# Patient Record
Sex: Female | Born: 1955 | Race: White | Hispanic: No | Smoking: Former smoker
Health system: Southern US, Community
[De-identification: ages and names within clinical notes are randomized; demographics above are authoritative.]

## PROBLEM LIST (undated history)

## (undated) DIAGNOSIS — G119 Hereditary ataxia, unspecified: Secondary | ICD-10-CM

## (undated) DIAGNOSIS — D500 Iron deficiency anemia secondary to blood loss (chronic): Secondary | ICD-10-CM

## (undated) DIAGNOSIS — S3609XA Other injury of spleen, initial encounter: Secondary | ICD-10-CM

## (undated) DIAGNOSIS — F329 Major depressive disorder, single episode, unspecified: Secondary | ICD-10-CM

## (undated) DIAGNOSIS — K909 Intestinal malabsorption, unspecified: Secondary | ICD-10-CM

## (undated) DIAGNOSIS — F339 Major depressive disorder, recurrent, unspecified: Secondary | ICD-10-CM

## (undated) DIAGNOSIS — I7300 Raynaud's syndrome without gangrene: Secondary | ICD-10-CM

## (undated) DIAGNOSIS — F32A Depression, unspecified: Secondary | ICD-10-CM

## (undated) DIAGNOSIS — F419 Anxiety disorder, unspecified: Secondary | ICD-10-CM

## (undated) DIAGNOSIS — J309 Allergic rhinitis, unspecified: Secondary | ICD-10-CM

## (undated) DIAGNOSIS — J449 Chronic obstructive pulmonary disease, unspecified: Secondary | ICD-10-CM

## (undated) HISTORY — DX: Anxiety disorder, unspecified: F41.9

## (undated) HISTORY — DX: Allergic rhinitis, unspecified: J30.9

## (undated) HISTORY — PX: ABDOMINAL HYSTERECTOMY: SHX81

## (undated) HISTORY — DX: Raynaud's syndrome without gangrene: I73.00

## (undated) HISTORY — DX: Intestinal malabsorption, unspecified: K90.9

## (undated) HISTORY — PX: WRIST FRACTURE SURGERY: SHX121

## (undated) HISTORY — DX: Iron deficiency anemia secondary to blood loss (chronic): D50.0

## (undated) HISTORY — DX: Major depressive disorder, recurrent, unspecified: F33.9

---

## 2007-08-18 ENCOUNTER — Encounter: Admission: RE | Admit: 2007-08-18 | Discharge: 2007-08-18 | Payer: Self-pay | Admitting: Obstetrics & Gynecology

## 2007-08-27 ENCOUNTER — Encounter: Admission: RE | Admit: 2007-08-27 | Discharge: 2007-08-27 | Payer: Self-pay | Admitting: Obstetrics & Gynecology

## 2008-03-15 ENCOUNTER — Emergency Department (HOSPITAL_COMMUNITY): Admission: EM | Admit: 2008-03-15 | Discharge: 2008-03-15 | Payer: Self-pay | Admitting: Emergency Medicine

## 2008-05-26 ENCOUNTER — Emergency Department (HOSPITAL_BASED_OUTPATIENT_CLINIC_OR_DEPARTMENT_OTHER): Admission: EM | Admit: 2008-05-26 | Discharge: 2008-05-26 | Payer: Self-pay | Admitting: Emergency Medicine

## 2008-12-08 DIAGNOSIS — G119 Hereditary ataxia, unspecified: Secondary | ICD-10-CM

## 2008-12-08 HISTORY — DX: Hereditary ataxia, unspecified: G11.9

## 2011-06-15 ENCOUNTER — Encounter: Payer: Self-pay | Admitting: Emergency Medicine

## 2011-06-15 ENCOUNTER — Emergency Department (HOSPITAL_BASED_OUTPATIENT_CLINIC_OR_DEPARTMENT_OTHER)
Admission: EM | Admit: 2011-06-15 | Discharge: 2011-06-15 | Disposition: A | Discharge status: Home / Self Care | Payer: 59 | Attending: Emergency Medicine | Admitting: Emergency Medicine

## 2011-06-15 ENCOUNTER — Emergency Department (INDEPENDENT_AMBULATORY_CARE_PROVIDER_SITE_OTHER): Payer: 59

## 2011-06-15 DIAGNOSIS — M25549 Pain in joints of unspecified hand: Secondary | ICD-10-CM

## 2011-06-15 DIAGNOSIS — S52609A Unspecified fracture of lower end of unspecified ulna, initial encounter for closed fracture: Secondary | ICD-10-CM

## 2011-06-15 DIAGNOSIS — S52509A Unspecified fracture of the lower end of unspecified radius, initial encounter for closed fracture: Secondary | ICD-10-CM

## 2011-06-15 DIAGNOSIS — W19XXXA Unspecified fall, initial encounter: Secondary | ICD-10-CM

## 2011-06-15 DIAGNOSIS — S62009A Unspecified fracture of navicular [scaphoid] bone of unspecified wrist, initial encounter for closed fracture: Secondary | ICD-10-CM | POA: Insufficient documentation

## 2011-06-15 DIAGNOSIS — W010XXA Fall on same level from slipping, tripping and stumbling without subsequent striking against object, initial encounter: Secondary | ICD-10-CM | POA: Insufficient documentation

## 2011-06-15 DIAGNOSIS — M25539 Pain in unspecified wrist: Secondary | ICD-10-CM

## 2011-06-15 HISTORY — DX: Major depressive disorder, single episode, unspecified: F32.9

## 2011-06-15 HISTORY — DX: Depression, unspecified: F32.A

## 2011-06-15 MED ORDER — KETOROLAC TROMETHAMINE 60 MG/2ML IM SOLN
INTRAMUSCULAR | Status: AC
  Filled 2011-06-15: qty 2

## 2011-06-15 MED ORDER — HYDROCODONE-ACETAMINOPHEN 5-500 MG PO TABS: 2.0000 | ORAL_TABLET | Freq: Four times a day (QID) | ORAL | Status: AC | PRN

## 2011-06-15 MED ORDER — KETOROLAC TROMETHAMINE 60 MG/2ML IM SOLN
60.0000 mg | Freq: Once | INTRAMUSCULAR | Status: AC
  Administered 2011-06-15: 60 mg via INTRAMUSCULAR

## 2011-06-15 MED ORDER — FENTANYL CITRATE 0.05 MG/ML IJ SOLN
INTRAMUSCULAR | Status: AC
  Filled 2011-06-15: qty 2

## 2011-06-15 MED ORDER — FENTANYL CITRATE 0.05 MG/ML IJ SOLN
50.0000 ug | Freq: Once | INTRAMUSCULAR | Status: AC
  Administered 2011-06-15: 50 ug via INTRAMUSCULAR

## 2011-06-15 NOTE — ED Provider Notes (Signed)
History     Chief Complaint  Patient presents with  . Wrist Pain   Patient is a 56 y.o. female presenting with wrist pain. The history is provided by the patient. No language interpreter was used.  Wrist Pain This is a new problem. The current episode started 1 to 2 hours ago. The problem occurs constantly. The problem has not changed since onset.Pertinent negatives include no chest pain, no abdominal pain, no headaches and no shortness of breath. The symptoms are aggravated by bending and twisting. The symptoms are relieved by nothing. She has tried nothing for the symptoms.  Patient tripped and fell on an outstretched right hand, injuring and deforming the right wrist.  Has abrasion to B lateral malleoli.  Did not strike head no LOC  Past Medical History  Diagnosis Date  . Depression     Past Surgical History  Procedure Date  . Abdominal hysterectomy     History reviewed. No pertinent family history.  History  Substance Use Topics  . Smoking status: Current Everyday Smoker  . Smokeless tobacco: Not on file  . Alcohol Use: Yes     socially    OB History    Grav Para Term Preterm Abortions TAB SAB Ect Mult Living                  Review of Systems  Constitutional: Negative for activity change.  HENT: Negative for facial swelling.   Eyes: Negative for discharge.  Respiratory: Negative for shortness of breath.   Cardiovascular: Negative for chest pain.  Gastrointestinal: Negative for abdominal pain.  Genitourinary: Negative for difficulty urinating.  Musculoskeletal: Positive for arthralgias.  Neurological: Negative for headaches.  Hematological: Negative.   Psychiatric/Behavioral: Negative.     Physical Exam  BP 116/94  Pulse 84  Temp(Src) 97.4 F (36.3 C) (Oral)  Resp 16  SpO2 99%  Physical Exam  Constitutional: She is oriented to person, place, and time. She appears well-developed and well-nourished.  HENT:  Head: Normocephalic and atraumatic.  Eyes:  Pupils are equal, round, and reactive to light.  Neck: Normal range of motion. Neck supple.  Cardiovascular: Normal rate, regular rhythm and normal heart sounds.   Pulmonary/Chest: Effort normal and breath sounds normal.  Abdominal: Soft. She exhibits no distension and no mass. There is no tenderness. There is no rebound and no guarding.  Musculoskeletal:       Right wrist: She exhibits decreased range of motion, tenderness, bony tenderness, swelling and deformity.       Right ankle: She exhibits normal range of motion and no swelling. no tenderness.       Snuff box tenderness right wrist  Abrasions B lateral malleoli  Neurological: She is alert and oriented to person, place, and time.  Skin: Skin is warm and dry. No erythema.       Abrasions B lateral malleoli  Psychiatric: She has a normal mood and affect.    ED Course  Procedures Case d/w Dr/ Amanda Pea, will see patient in the office at 10 tomorrow.  Sugar tong splint MDM       Gerri Acre K Damaya Channing-Rasch, MD 06/16/11 2244

## 2011-06-15 NOTE — ED Notes (Signed)
I removed the patient's ring from her right ring finger. I had to use the manuel ring cutter to remove it.

## 2011-06-15 NOTE — ED Notes (Signed)
Pt presents to ED today s/p fall from standing with injuries to RUE.  Pt has noted swelling and possible deformity to wrist/forearm.  Pt guarding movments and states pain 10/10.  RUE braced with pillow and blankets

## 2011-06-15 NOTE — ED Notes (Signed)
Family at bedside.  Pt. Is alert and oriented with c/o pain in the R wrist with deformity noted.  Skin is warm and dry with no abrasions.

## 2011-06-15 NOTE — ED Notes (Signed)
Pt. Has had splint placed and aware of going to see Hand/ Wrist surgeon on Monday.

## 2011-06-15 NOTE — ED Notes (Signed)
Family at bedside. 

## 2013-08-15 DIAGNOSIS — G43909 Migraine, unspecified, not intractable, without status migrainosus: Secondary | ICD-10-CM | POA: Insufficient documentation

## 2014-02-22 DIAGNOSIS — F17210 Nicotine dependence, cigarettes, uncomplicated: Secondary | ICD-10-CM | POA: Insufficient documentation

## 2014-04-19 DIAGNOSIS — G112 Late-onset cerebellar ataxia: Secondary | ICD-10-CM | POA: Insufficient documentation

## 2014-10-26 ENCOUNTER — Emergency Department (HOSPITAL_COMMUNITY): Payer: Medicare Other

## 2014-10-26 ENCOUNTER — Inpatient Hospital Stay (HOSPITAL_COMMUNITY)
Admission: EM | Admit: 2014-10-26 | Discharge: 2014-10-31 | DRG: 800 | Disposition: A | Discharge status: Home / Self Care | Payer: Medicare Other | Attending: General Surgery | Admitting: General Surgery

## 2014-10-26 ENCOUNTER — Inpatient Hospital Stay (HOSPITAL_COMMUNITY): Payer: Medicare Other | Admitting: Certified Registered Nurse Anesthetist

## 2014-10-26 ENCOUNTER — Encounter (HOSPITAL_COMMUNITY): Payer: Self-pay | Admitting: Emergency Medicine

## 2014-10-26 ENCOUNTER — Encounter (HOSPITAL_COMMUNITY): Admission: EM | Disposition: A | Discharge status: Home / Self Care | Payer: 59 | Source: Home / Self Care

## 2014-10-26 DIAGNOSIS — R109 Unspecified abdominal pain: Secondary | ICD-10-CM

## 2014-10-26 DIAGNOSIS — F329 Major depressive disorder, single episode, unspecified: Secondary | ICD-10-CM | POA: Diagnosis present

## 2014-10-26 DIAGNOSIS — Y92009 Unspecified place in unspecified non-institutional (private) residence as the place of occurrence of the external cause: Secondary | ICD-10-CM

## 2014-10-26 DIAGNOSIS — W1830XA Fall on same level, unspecified, initial encounter: Secondary | ICD-10-CM | POA: Diagnosis present

## 2014-10-26 DIAGNOSIS — F17210 Nicotine dependence, cigarettes, uncomplicated: Secondary | ICD-10-CM | POA: Diagnosis present

## 2014-10-26 DIAGNOSIS — G111 Early-onset cerebellar ataxia: Secondary | ICD-10-CM | POA: Diagnosis present

## 2014-10-26 DIAGNOSIS — K661 Hemoperitoneum: Secondary | ICD-10-CM

## 2014-10-26 DIAGNOSIS — R7989 Other specified abnormal findings of blood chemistry: Secondary | ICD-10-CM

## 2014-10-26 DIAGNOSIS — W19XXXA Unspecified fall, initial encounter: Secondary | ICD-10-CM

## 2014-10-26 DIAGNOSIS — S3609XA Other injury of spleen, initial encounter: Secondary | ICD-10-CM | POA: Diagnosis present

## 2014-10-26 DIAGNOSIS — D62 Acute posthemorrhagic anemia: Secondary | ICD-10-CM | POA: Diagnosis present

## 2014-10-26 DIAGNOSIS — D72829 Elevated white blood cell count, unspecified: Secondary | ICD-10-CM | POA: Diagnosis not present

## 2014-10-26 DIAGNOSIS — S36039A Unspecified laceration of spleen, initial encounter: Secondary | ICD-10-CM

## 2014-10-26 DIAGNOSIS — R079 Chest pain, unspecified: Secondary | ICD-10-CM | POA: Diagnosis present

## 2014-10-26 DIAGNOSIS — Z9081 Acquired absence of spleen: Secondary | ICD-10-CM

## 2014-10-26 DIAGNOSIS — G119 Hereditary ataxia, unspecified: Secondary | ICD-10-CM

## 2014-10-26 HISTORY — DX: Other injury of spleen, initial encounter: S36.09XA

## 2014-10-26 HISTORY — DX: Hereditary ataxia, unspecified: G11.9

## 2014-10-26 HISTORY — PX: SPLENECTOMY, TOTAL: SHX788

## 2014-10-26 LAB — BASIC METABOLIC PANEL
Anion gap: 10 (ref 5–15)
Anion gap: 11 (ref 5–15)
BUN: 15 mg/dL (ref 6–23)
BUN: 15 mg/dL (ref 6–23)
CO2: 25 mEq/L (ref 19–32)
CO2: 24 mEq/L (ref 19–32)
Calcium: 8.7 mg/dL (ref 8.4–10.5)
Calcium: 8.4 mg/dL (ref 8.4–10.5)
Chloride: 108 mEq/L (ref 96–112)
Chloride: 107 mEq/L (ref 96–112)
Creatinine, Ser: 0.79 mg/dL (ref 0.50–1.10)
Creatinine, Ser: 0.7 mg/dL (ref 0.50–1.10)
GFR calc Af Amer: >90 mL/min (ref >90)
GFR calc Af Amer: >90 mL/min (ref >90)
GFR calc non Af Amer: 89 mL/min — ABNORMAL LOW (ref >90)
GFR calc non Af Amer: >90 mL/min (ref >90)
Glucose, Bld: 126 mg/dL — ABNORMAL HIGH (ref 70–99)
Glucose, Bld: 136 mg/dL — ABNORMAL HIGH (ref 70–99)
Potassium: 4.1 mEq/L (ref 3.7–5.3)
Potassium: 3.9 mEq/L (ref 3.7–5.3)
Sodium: 143 mEq/L (ref 137–147)
Sodium: 142 mEq/L (ref 137–147)

## 2014-10-26 LAB — HEPATIC FUNCTION PANEL
ALT: 10 U/L (ref 0–35)
AST: 13 U/L (ref 0–37)
Albumin: 3.3 g/dL — ABNORMAL LOW (ref 3.5–5.2)
Alkaline Phosphatase: 72 U/L (ref 39–117)
Bilirubin, Direct: <0.2 mg/dL (ref 0.0–0.3)
Total Bilirubin: <0.2 mg/dL — ABNORMAL LOW (ref 0.3–1.2)
Total Protein: 5.9 g/dL — ABNORMAL LOW (ref 6.0–8.3)

## 2014-10-26 LAB — ABO/RH: ABO/RH(D): O NEG

## 2014-10-26 LAB — CBC
HCT: 28.7 % — ABNORMAL LOW (ref 36.0–46.0)
HCT: 31.4 % — ABNORMAL LOW (ref 36.0–46.0)
Hemoglobin: 8.4 g/dL — ABNORMAL LOW (ref 12.0–15.0)
Hemoglobin: 9.8 g/dL — ABNORMAL LOW (ref 12.0–15.0)
MCH: 22.8 pg — ABNORMAL LOW (ref 26.0–34.0)
MCH: 25.3 pg — ABNORMAL LOW (ref 26.0–34.0)
MCHC: 29.3 g/dL — ABNORMAL LOW (ref 30.0–36.0)
MCHC: 31.2 g/dL (ref 30.0–36.0)
MCV: 77.8 fL — ABNORMAL LOW (ref 78.0–100.0)
MCV: 80.9 fL (ref 78.0–100.0)
Platelets: 217 10*3/uL (ref 150–400)
Platelets: 159 10*3/uL (ref 150–400)
RBC: 3.69 MIL/uL — ABNORMAL LOW (ref 3.87–5.11)
RBC: 3.88 MIL/uL (ref 3.87–5.11)
RDW: 16.2 % — ABNORMAL HIGH (ref 11.5–15.5)
RDW: 15.5 % (ref 11.5–15.5)
WBC: 8.3 10*3/uL (ref 4.0–10.5)
WBC: 17.5 10*3/uL — ABNORMAL HIGH (ref 4.0–10.5)

## 2014-10-26 LAB — PREPARE RBC (CROSSMATCH)

## 2014-10-26 LAB — I-STAT TROPONIN, ED: Troponin i, poc: 0 ng/mL (ref 0.00–0.08)

## 2014-10-26 LAB — MRSA PCR SCREENING: MRSA by PCR: NEGATIVE

## 2014-10-26 LAB — POC OCCULT BLOOD, ED: Fecal Occult Bld: NEGATIVE

## 2014-10-26 LAB — LIPASE, BLOOD: Lipase: 25 U/L (ref 11–59)

## 2014-10-26 LAB — D-DIMER, QUANTITATIVE: D-Dimer, Quant: 2.08 ug/mL-FEU — ABNORMAL HIGH (ref 0.00–0.48)

## 2014-10-26 SURGERY — SPLENECTOMY
Anesthesia: General

## 2014-10-26 MED ORDER — CEFAZOLIN SODIUM-DEXTROSE 2-3 GM-% IV SOLR
2.0000 g | Freq: Once | INTRAVENOUS | Status: AC
  Administered 2014-10-26: 2000 mg via INTRAVENOUS

## 2014-10-26 MED ORDER — FENTANYL CITRATE 0.05 MG/ML IJ SOLN
INTRAMUSCULAR | Status: AC
  Filled 2014-10-26: qty 5

## 2014-10-26 MED ORDER — LIDOCAINE HCL (CARDIAC) 20 MG/ML IV SOLN
INTRAVENOUS | Status: AC
  Filled 2014-10-26: qty 5

## 2014-10-26 MED ORDER — KCL IN DEXTROSE-NACL 20-5-0.45 MEQ/L-%-% IV SOLN
INTRAVENOUS | Status: DC
  Administered 2014-10-26 – 2014-10-30 (×8): via INTRAVENOUS
  Filled 2014-10-26 (×10): qty 1000

## 2014-10-26 MED ORDER — ONDANSETRON HCL 4 MG PO TABS: 4.0000 mg | ORAL_TABLET | Freq: Four times a day (QID) | ORAL | Status: DC | PRN

## 2014-10-26 MED ORDER — HYDROMORPHONE HCL 1 MG/ML IJ SOLN
INTRAMUSCULAR | Status: AC
  Filled 2014-10-26: qty 2

## 2014-10-26 MED ORDER — PROPOFOL 10 MG/ML IV BOLUS
INTRAVENOUS | Status: AC
  Filled 2014-10-26: qty 20

## 2014-10-26 MED ORDER — BISACODYL 10 MG RE SUPP: 10.0000 mg | Freq: Every day | RECTAL | Status: DC | PRN

## 2014-10-26 MED ORDER — DIPHENHYDRAMINE HCL 12.5 MG/5ML PO ELIX
12.5000 mg | ORAL_SOLUTION | Freq: Four times a day (QID) | ORAL | Status: DC | PRN
  Filled 2014-10-26: qty 5

## 2014-10-26 MED ORDER — FENTANYL CITRATE 0.05 MG/ML IJ SOLN
100.0000 ug | Freq: Once | INTRAMUSCULAR | Status: AC
  Administered 2014-10-26: 100 ug via INTRAVENOUS
  Filled 2014-10-26: qty 2

## 2014-10-26 MED ORDER — NALOXONE HCL 0.4 MG/ML IJ SOLN
0.4000 mg | INTRAMUSCULAR | Status: DC | PRN
  Filled 2014-10-26: qty 1

## 2014-10-26 MED ORDER — ONDANSETRON HCL 4 MG/2ML IJ SOLN
INTRAMUSCULAR | Status: DC | PRN
  Administered 2014-10-26: 4 mg via INTRAVENOUS

## 2014-10-26 MED ORDER — POTASSIUM CHLORIDE IN NACL 20-0.9 MEQ/L-% IV SOLN
INTRAVENOUS | Status: DC
  Filled 2014-10-26 (×4): qty 1000

## 2014-10-26 MED ORDER — HYDROMORPHONE HCL 1 MG/ML IJ SOLN
0.5000 mg | INTRAMUSCULAR | Status: DC | PRN
  Administered 2014-10-26 – 2014-10-27 (×2): 1 mg via INTRAVENOUS
  Filled 2014-10-26 (×2): qty 1

## 2014-10-26 MED ORDER — PROPOFOL 10 MG/ML IV BOLUS
INTRAVENOUS | Status: DC | PRN
  Administered 2014-10-26: 50 mg via INTRAVENOUS

## 2014-10-26 MED ORDER — HYDROMORPHONE HCL 1 MG/ML IJ SOLN
0.2500 mg | INTRAMUSCULAR | Status: DC | PRN
  Administered 2014-10-26 (×4): 0.5 mg via INTRAVENOUS

## 2014-10-26 MED ORDER — IOHEXOL 300 MG/ML  SOLN
70.0000 mL | Freq: Once | INTRAMUSCULAR | Status: AC | PRN
  Administered 2014-10-26: 70 mL via INTRAVENOUS

## 2014-10-26 MED ORDER — ONDANSETRON HCL 4 MG/2ML IJ SOLN
4.0000 mg | Freq: Four times a day (QID) | INTRAMUSCULAR | Status: DC | PRN
  Administered 2014-10-28 – 2014-10-29 (×2): 4 mg via INTRAVENOUS
  Filled 2014-10-26: qty 2

## 2014-10-26 MED ORDER — SODIUM CHLORIDE 0.9 % IV SOLN: Freq: Once | INTRAVENOUS | Status: DC

## 2014-10-26 MED ORDER — LACTATED RINGERS IV SOLN
INTRAVENOUS | Status: DC | PRN
  Administered 2014-10-26 (×2): via INTRAVENOUS

## 2014-10-26 MED ORDER — SCOPOLAMINE 1 MG/3DAYS TD PT72
MEDICATED_PATCH | TRANSDERMAL | Status: AC
  Filled 2014-10-26: qty 1

## 2014-10-26 MED ORDER — GLYCOPYRROLATE 0.2 MG/ML IJ SOLN
INTRAMUSCULAR | Status: DC | PRN
  Administered 2014-10-26: .8 mg via INTRAVENOUS

## 2014-10-26 MED ORDER — NEOSTIGMINE METHYLSULFATE 10 MG/10ML IV SOLN
INTRAVENOUS | Status: DC | PRN
Start: 2014-10-26 — End: 2014-10-26
  Administered 2014-10-26: 5 mg via INTRAVENOUS

## 2014-10-26 MED ORDER — SUCCINYLCHOLINE CHLORIDE 20 MG/ML IJ SOLN
INTRAMUSCULAR | Status: AC
  Filled 2014-10-26: qty 1

## 2014-10-26 MED ORDER — ONDANSETRON HCL 4 MG/2ML IJ SOLN
4.0000 mg | Freq: Once | INTRAMUSCULAR | Status: AC
  Administered 2014-10-26: 4 mg via INTRAVENOUS
  Filled 2014-10-26: qty 2

## 2014-10-26 MED ORDER — CETYLPYRIDINIUM CHLORIDE 0.05 % MT LIQD
7.0000 mL | Freq: Two times a day (BID) | OROMUCOSAL | Status: DC
  Administered 2014-10-26 – 2014-10-30 (×9): 7 mL via OROMUCOSAL

## 2014-10-26 MED ORDER — IOHEXOL 300 MG/ML  SOLN: 80.0000 mL | Freq: Once | INTRAMUSCULAR | Status: AC | PRN

## 2014-10-26 MED ORDER — FENTANYL 10 MCG/ML IV SOLN
INTRAVENOUS | Status: DC
  Administered 2014-10-26: 18:00:00 via INTRAVENOUS
  Administered 2014-10-26: 60 ug via INTRAVENOUS
  Administered 2014-10-27: 45 ug via INTRAVENOUS
  Administered 2014-10-27: 180 ug via INTRAVENOUS
  Administered 2014-10-27: 08:00:00 via INTRAVENOUS
  Filled 2014-10-26 (×2): qty 50

## 2014-10-26 MED ORDER — LIDOCAINE HCL (CARDIAC) 20 MG/ML IV SOLN
INTRAVENOUS | Status: DC | PRN
  Administered 2014-10-26: 50 mg via INTRAVENOUS

## 2014-10-26 MED ORDER — THROMBIN 20000 UNITS EX KIT
PACK | CUTANEOUS | Status: AC
  Filled 2014-10-26: qty 1

## 2014-10-26 MED ORDER — SODIUM CHLORIDE 0.9 % IV BOLUS (SEPSIS)
500.0000 mL | Freq: Once | INTRAVENOUS | Status: AC
  Administered 2014-10-26: 500 mL via INTRAVENOUS

## 2014-10-26 MED ORDER — MIDAZOLAM HCL 2 MG/2ML IJ SOLN
INTRAMUSCULAR | Status: AC
  Filled 2014-10-26: qty 2

## 2014-10-26 MED ORDER — 0.9 % SODIUM CHLORIDE (POUR BTL) OPTIME
TOPICAL | Status: DC | PRN
  Administered 2014-10-26 (×2): 1000 mL

## 2014-10-26 MED ORDER — SODIUM CHLORIDE 0.9 % IJ SOLN: 9.0000 mL | INTRAMUSCULAR | Status: DC | PRN

## 2014-10-26 MED ORDER — EPHEDRINE SULFATE 50 MG/ML IJ SOLN
INTRAMUSCULAR | Status: DC | PRN
  Administered 2014-10-26: 5 mg via INTRAVENOUS

## 2014-10-26 MED ORDER — PHENYLEPHRINE HCL 10 MG/ML IJ SOLN
INTRAMUSCULAR | Status: DC | PRN
  Administered 2014-10-26 (×2): 40 ug via INTRAVENOUS
  Administered 2014-10-26 (×2): 80 ug via INTRAVENOUS

## 2014-10-26 MED ORDER — PANTOPRAZOLE SODIUM 40 MG IV SOLR
40.0000 mg | Freq: Every day | INTRAVENOUS | Status: DC
  Administered 2014-10-26 – 2014-10-27 (×2): 40 mg via INTRAVENOUS
  Filled 2014-10-26 (×5): qty 40

## 2014-10-26 MED ORDER — FENTANYL CITRATE 0.05 MG/ML IJ SOLN
INTRAMUSCULAR | Status: DC | PRN
  Administered 2014-10-26 (×2): 50 ug via INTRAVENOUS
  Administered 2014-10-26: 100 ug via INTRAVENOUS
  Administered 2014-10-26: 50 ug via INTRAVENOUS
  Administered 2014-10-26: 100 ug via INTRAVENOUS

## 2014-10-26 MED ORDER — PANTOPRAZOLE SODIUM 40 MG PO TBEC
40.0000 mg | DELAYED_RELEASE_TABLET | Freq: Every day | ORAL | Status: DC
  Administered 2014-10-28 – 2014-10-31 (×4): 40 mg via ORAL
  Filled 2014-10-26 (×4): qty 1

## 2014-10-26 MED ORDER — DIPHENHYDRAMINE HCL 50 MG/ML IJ SOLN
12.5000 mg | Freq: Four times a day (QID) | INTRAMUSCULAR | Status: DC | PRN
  Filled 2014-10-26: qty 0.25

## 2014-10-26 MED ORDER — ARTIFICIAL TEARS OP OINT
TOPICAL_OINTMENT | OPHTHALMIC | Status: DC | PRN
  Administered 2014-10-26: 1 via OPHTHALMIC

## 2014-10-26 MED ORDER — IOHEXOL 350 MG/ML SOLN
80.0000 mL | Freq: Once | INTRAVENOUS | Status: AC | PRN
  Administered 2014-10-26: 80 mL via INTRAVENOUS

## 2014-10-26 MED ORDER — MIDAZOLAM HCL 5 MG/5ML IJ SOLN
INTRAMUSCULAR | Status: DC | PRN
  Administered 2014-10-26: 2 mg via INTRAVENOUS

## 2014-10-26 MED ORDER — FENTANYL CITRATE 0.05 MG/ML IJ SOLN
INTRAMUSCULAR | Status: AC
  Filled 2014-10-26: qty 2

## 2014-10-26 MED ORDER — DOCUSATE SODIUM 100 MG PO CAPS
100.0000 mg | ORAL_CAPSULE | Freq: Two times a day (BID) | ORAL | Status: DC
  Administered 2014-10-27 – 2014-10-31 (×9): 100 mg via ORAL
  Filled 2014-10-26 (×11): qty 1

## 2014-10-26 MED ORDER — ONDANSETRON HCL 4 MG/2ML IJ SOLN
4.0000 mg | Freq: Four times a day (QID) | INTRAMUSCULAR | Status: DC | PRN
  Filled 2014-10-26: qty 2

## 2014-10-26 MED ORDER — HYDROMORPHONE HCL 1 MG/ML IJ SOLN
1.0000 mg | Freq: Once | INTRAMUSCULAR | Status: AC
  Administered 2014-10-26: 1 mg via INTRAVENOUS
  Filled 2014-10-26: qty 1

## 2014-10-26 MED ORDER — SUCCINYLCHOLINE CHLORIDE 20 MG/ML IJ SOLN
INTRAMUSCULAR | Status: DC | PRN
Start: 2014-10-26 — End: 2014-10-26
  Administered 2014-10-26: 40 mg via INTRAVENOUS

## 2014-10-26 MED ORDER — NITROGLYCERIN 2 % TD OINT: 0.5000 [in_us] | TOPICAL_OINTMENT | Freq: Once | TRANSDERMAL | Status: DC

## 2014-10-26 MED ORDER — FENTANYL CITRATE 0.05 MG/ML IJ SOLN
50.0000 ug | Freq: Once | INTRAMUSCULAR | Status: AC
  Administered 2014-10-26: 50 ug via INTRAVENOUS
  Filled 2014-10-26: qty 2

## 2014-10-26 MED ORDER — ROCURONIUM BROMIDE 100 MG/10ML IV SOLN
INTRAVENOUS | Status: DC | PRN
  Administered 2014-10-26: 30 mg via INTRAVENOUS
  Administered 2014-10-26: 10 mg via INTRAVENOUS

## 2014-10-26 SURGICAL SUPPLY — 47 items
APPLIER CLIP 11 MED OPEN (CLIP) ×2
APPLIER CLIP 13 LRG OPEN (CLIP)
BLADE SURG ROTATE 9660 (MISCELLANEOUS) IMPLANT
CANISTER SUCTION 2500CC (MISCELLANEOUS) ×4 IMPLANT
CHLORAPREP W/TINT 26ML (MISCELLANEOUS) ×2 IMPLANT
CLIP APPLIE 11 MED OPEN (CLIP) ×1 IMPLANT
CLIP APPLIE 13 LRG OPEN (CLIP) IMPLANT
COVER SURGICAL LIGHT HANDLE (MISCELLANEOUS) ×2 IMPLANT
DECANTER SPIKE VIAL GLASS SM (MISCELLANEOUS) IMPLANT
DRAPE LAPAROSCOPIC ABDOMINAL (DRAPES) ×2 IMPLANT
DRAPE UTILITY 15X26 W/TAPE STR (DRAPE) ×4 IMPLANT
ELECT BLADE 6.5 EXT (BLADE) ×2 IMPLANT
ELECT REM PT RETURN 9FT ADLT (ELECTROSURGICAL) ×2
ELECTRODE REM PT RTRN 9FT ADLT (ELECTROSURGICAL) ×1 IMPLANT
GLOVE BIO SURGEON STRL SZ8 (GLOVE) ×2 IMPLANT
GLOVE BIOGEL M 7.0 STRL (GLOVE) ×2 IMPLANT
GLOVE BIOGEL PI IND STRL 8 (GLOVE) ×1 IMPLANT
GLOVE BIOGEL PI INDICATOR 8 (GLOVE) ×1
GLOVE SURG SS PI 7.0 STRL IVOR (GLOVE) ×2 IMPLANT
GOWN STRL REUS W/ TWL LRG LVL3 (GOWN DISPOSABLE) ×2 IMPLANT
GOWN STRL REUS W/ TWL XL LVL3 (GOWN DISPOSABLE) ×1 IMPLANT
GOWN STRL REUS W/TWL LRG LVL3 (GOWN DISPOSABLE) ×2
GOWN STRL REUS W/TWL XL LVL3 (GOWN DISPOSABLE) ×1
HEMOSTAT SURGICEL 2X14 (HEMOSTASIS) IMPLANT
KIT BASIN OR (CUSTOM PROCEDURE TRAY) ×2 IMPLANT
KIT ROOM TURNOVER OR (KITS) ×2 IMPLANT
NS IRRIG 1000ML POUR BTL (IV SOLUTION) ×6 IMPLANT
PACK GENERAL/GYN (CUSTOM PROCEDURE TRAY) ×2 IMPLANT
PAD ARMBOARD 7.5X6 YLW CONV (MISCELLANEOUS) ×4 IMPLANT
SPECIMEN JAR X LARGE (MISCELLANEOUS) ×2 IMPLANT
SPONGE GAUZE 4X4 12PLY STER LF (GAUZE/BANDAGES/DRESSINGS) ×2 IMPLANT
SPONGE INTESTINAL PEANUT (DISPOSABLE) IMPLANT
SPONGE LAP 18X18 X RAY DECT (DISPOSABLE) ×6 IMPLANT
SPONGE SURGIFOAM ABS GEL 100 (HEMOSTASIS) IMPLANT
STAPLER VISISTAT 35W (STAPLE) ×2 IMPLANT
SUCTION POOLE TIP (SUCTIONS) ×2 IMPLANT
SUT PDS AB 1 TP1 96 (SUTURE) ×4 IMPLANT
SUT SILK 0 TIES 10X30 (SUTURE) ×2 IMPLANT
SUT SILK 2 0 SH (SUTURE) ×4 IMPLANT
SUT SILK 2 0 TIES 10X30 (SUTURE) ×2 IMPLANT
SUT SILK 2 0SH CR/8 30 (SUTURE) ×2 IMPLANT
TAPE CLOTH SURG 6X10 WHT LF (GAUZE/BANDAGES/DRESSINGS) ×2 IMPLANT
TOWEL OR 17X24 6PK STRL BLUE (TOWEL DISPOSABLE) ×2 IMPLANT
TOWEL OR 17X26 10 PK STRL BLUE (TOWEL DISPOSABLE) ×2 IMPLANT
TRAY FOLEY CATH 16FRSI W/METER (SET/KITS/TRAYS/PACK) ×2 IMPLANT
WATER STERILE IRR 1000ML POUR (IV SOLUTION) IMPLANT
YANKAUER SUCT BULB TIP NO VENT (SUCTIONS) ×2 IMPLANT

## 2014-10-26 NOTE — Anesthesia Postprocedure Evaluation (Signed)
  Anesthesia Post-op Note  Patient: Sherry Zimmerman  Procedure(s) Performed: Procedure(s): SPLENECTOMY (N/A)  Patient Location: PACU  Anesthesia Type:General  Level of Consciousness: awake, alert  and oriented  Airway and Oxygen Therapy: Patient Spontanous Breathing and Patient connected to nasal cannula oxygen  Post-op Pain: mild  Post-op Assessment: Post-op Vital signs reviewed, Patient's Cardiovascular Status Stable, Respiratory Function Stable, Patent Airway and No signs of Nausea or vomiting  Post-op Vital Signs: stable  Last Vitals:  Filed Vitals:   10/26/14 1818  BP:   Pulse:   Temp:   Resp: 21    Complications: No apparent anesthesia complications

## 2014-10-26 NOTE — ED Provider Notes (Signed)
CSN: [id number]     Arrival date & time 10/26/14  1856 History   First MD Initiated Contact with Patient 10/26/14 0535     Chief Complaint  Patient presents with  . Chest Pain   HPI  Patient is a 58 year old female with a past medical history of cerebellar ataxia who presents to the emergency room with left-sided chest pain 2 hours. Patient states that she was awoken from sleep this morning at 4:30 AM with sharp left sided chest pain that radiated to her left breast and upper abdomen. Patient states that this pain is sharp and stabbing. She denies previous history of this type of pain. She states that deep breathing and movement make her pain worse. She has tried taking 325 mg of aspirin. She felt no relief from aspirin.  Patient is a current every day smoker. She has at least a 30-pack-year history of smoking. She was recently seen and evaluated by her primary care doctor Dr. Jonni Sanger on 10/18/2014. She was told that she had mild anemia at that time. Patient has no personal history of heart problems. She denies history of CABG and stents. She has never had a cardiac stress test before. Patient denies a family history of heart problems.   Of note patient states that she has fallen several times recently. She states that this is not unusual due to her poor balance from her cerebellar ataxia. Her most recent fall was yesterday. She states that she was getting up from a table and fell backwards. She denies any direct blows to the abdomen.  Past Medical History  Diagnosis Date  . Depression   . Cerebellar ataxia 2010  . Splenic rupture 10/26/14   Past Surgical History  Procedure Laterality Date  . Abdominal hysterectomy    . Wrist fracture surgery Right    History reviewed. No pertinent family history. History  Substance Use Topics  . Smoking status: Current Every Day Smoker  . Smokeless tobacco: Not on file  . Alcohol Use: Yes     Comment: socially   OB History    No data available      Review of Systems  Constitutional: Positive for fatigue. Negative for fever and chills.  Respiratory: Positive for chest tightness and shortness of breath. Negative for cough and wheezing.   Cardiovascular: Positive for chest pain. Negative for palpitations and leg swelling.  Gastrointestinal: Negative for nausea, vomiting, abdominal pain, diarrhea, constipation and blood in stool.  Musculoskeletal: Negative for myalgias.  Neurological: Negative for dizziness, numbness and headaches.  All other systems reviewed and are negative.     Allergies  Morphine and related and Sulfa antibiotics  Home Medications   Prior to Admission medications   Medication Sig Start Date End Date Taking? Authorizing Provider  Calcium Carbonate (CALCIUM 500 PO) Take 1 tablet by mouth daily.    Yes Historical Provider, MD  venlafaxine XR (EFFEXOR-XR) 150 MG 24 hr capsule Take 150 mg by mouth at bedtime. 10/20/14  Yes Historical Provider, MD  VITAMIN D, CHOLECALCIFEROL, PO Take 1 tablet by mouth daily.    Yes Historical Provider, MD  Venlafaxine HCl (EFFEXOR PO) Take by mouth.      Historical Provider, MD   BP 117/63 mmHg  Pulse 92  Temp(Src) 97.6 F (36.4 C) (Oral)  Resp 20  Ht 5\' 9"  (1.753 m)  Wt 127 lb (57.607 kg)  BMI 18.75 kg/m2  SpO2 94% Physical Exam  Constitutional: She appears well-developed and well-nourished. No distress.  HENT:  Head: Normocephalic and atraumatic.  Mouth/Throat: Oropharynx is clear and moist. No oropharyngeal exudate.  Eyes: Conjunctivae and EOM are normal. Pupils are equal, round, and reactive to light. No scleral icterus.  Neck: Normal range of motion. Neck supple. No JVD present. No thyromegaly present.  Cardiovascular: Normal rate, regular rhythm, normal heart sounds and intact distal pulses.  Exam reveals no gallop and no friction rub.   No murmur heard. Pulmonary/Chest: Effort normal and breath sounds normal. No respiratory distress. She has no wheezes. She has no  rales. She exhibits tenderness.  Abdominal: Soft. Normal appearance and bowel sounds are normal. She exhibits no distension and no mass. There is generalized tenderness. There is guarding (voluntary). There is no rigidity, no rebound, no tenderness at McBurney's point and negative Murphy's sign.  Lymphadenopathy:    She has no cervical adenopathy.  Skin: She is not diaphoretic.  Nursing note and vitals reviewed.   ED Course  Procedures   CRITICAL CARE Performed by: Starlyn Skeans A   Total critical care time: 75 minutes  Critical care time was exclusive of separately billable procedures and treating other patients.  Critical care was necessary to treat or prevent imminent or life-threatening deterioration.  Critical care was time spent personally by me on the following activities: development of treatment plan with patient and/or surrogate as well as nursing, discussions with consultants, evaluation of patient's response to treatment, examination of patient, obtaining history from patient or surrogate, ordering and performing treatments and interventions, ordering and review of laboratory studies, ordering and review of radiographic studies, pulse oximetry and re-evaluation of patient's condition.  Labs Review Labs Reviewed  BASIC METABOLIC PANEL - Abnormal; Notable for the following:    Glucose, Bld 126 (*)    GFR calc non Af Amer 89 (*)    All other components within normal limits  CBC - Abnormal; Notable for the following:    RBC 3.69 (*)    Hemoglobin 8.4 (*)    HCT 28.7 (*)    MCV 77.8 (*)    MCH 22.8 (*)    MCHC 29.3 (*)    RDW 16.2 (*)    All other components within normal limits  HEPATIC FUNCTION PANEL - Abnormal; Notable for the following:    Total Protein 5.9 (*)    Albumin 3.3 (*)    Total Bilirubin <0.2 (*)    All other components within normal limits  D-DIMER, QUANTITATIVE - Abnormal; Notable for the following:    D-Dimer, Quant 2.08 (*)    All other  components within normal limits  LIPASE, BLOOD  OCCULT BLOOD X 1 CARD TO LAB, STOOL  I-STAT TROPOININ, ED  POC OCCULT BLOOD, ED  TYPE AND SCREEN  PREPARE FRESH FROZEN PLASMA  TYPE AND SCREEN  TYPE AND SCREEN  PREPARE RBC (CROSSMATCH)  PREPARE FRESH FROZEN PLASMA    Imaging Review Dg Chest 2 View  10/26/2014   CLINICAL DATA:  Weakness and shortness of breath. Chest pain since last night.  EXAM: CHEST  2 VIEW  COMPARISON:  None.  FINDINGS: Hyperinflation suggesting emphysema. No focal airspace disease or consolidation in the lungs. No blunting of costophrenic angles. No pneumothorax. Heart size and pulmonary vascularity are normal. Old or healing fracture of the left eighth rib.  IMPRESSION: Hyperinflation suggesting emphysema. No evidence of active pulmonary disease.   Electronically Signed   By: Lucienne Capers M.D.   On: 10/26/2014 07:10   Ct Angio Chest Pe W/cm &/or Wo Cm  10/26/2014  CLINICAL DATA:  Acute onset left-sided chest pain, worse with deep inspiration. Elevated D-dimer.  EXAM: CT ANGIOGRAPHY CHEST WITH CONTRAST  TECHNIQUE: Multidetector CT imaging of the chest was performed using the standard protocol during bolus administration of intravenous contrast. Multiplanar CT image reconstructions and MIPs were obtained to evaluate the vascular anatomy.  CONTRAST:  87mL OMNIPAQUE IOHEXOL 350 MG/ML SOLN  COMPARISON:  Chest radiographs earlier today  FINDINGS: Pulmonary arterial opacification is adequate without filling defects identified to indicate emboli. Mild LAD coronary artery calcification is noted. Heart is normal in size. No enlarged axillary, mediastinal, or hilar lymph nodes are identified. There is no pleural or pericardial effusion.  There is moderate centrilobular emphysema. Minimal scarring is noted at the apices. There is slight nodularity in the posterior right middle lobe just anterior to the major fissure, with a 4 mm nodule best seen on coronal images (series 7, image  49). Minimal dependent atelectasis is present in the right lower lobe. Major airways are patent.  Visualized portion of the upper abdomen demonstrates enlargement and heterogeneity of the spleen with surrounding high density fluid consistent with blood. This is incompletely visualized. Perihepatic fluid is also present consistent with blood. There is a small sliding hiatal hernia. Old left eighth rib fracture is noted.  Review of the MIP images confirms the above findings.  IMPRESSION: 1. No evidence of pulmonary emboli or other acute abnormality in the chest. 2. Partial visualization of intraperitoneal hemorrhage in the upper abdomen which appears to reflect splenic hemorrhage. Further evaluation with abdominal and pelvic CT is recommended. 3. 4 mm right middle lobe nodule. If the patient is at high risk for bronchogenic carcinoma, follow-up chest CT at 1 year is recommended. If the patient is at low risk, no follow-up is needed. This recommendation follows the consensus statement: Guidelines for Management of Small Pulmonary Nodules Detected on CT Scans: A Statement from the [employer] as published in Radiology 2005; 237:395-400. Critical Value/emergent results were called by telephone at the time of interpretation on 10/26/2014 at 11:42 am to [facility] , who verbally acknowledged these results.   Electronically Signed   By: Logan Bores   On: 10/26/2014 11:43   Ct Abdomen Pelvis W Contrast  10/26/2014   CLINICAL DATA:  Epigastric pain, drop in hemoglobin.  Recent fall.  EXAM: CT ABDOMEN AND PELVIS WITH CONTRAST  TECHNIQUE: Multidetector CT imaging of the abdomen and pelvis was performed using the standard protocol following bolus administration of intravenous contrast.  CONTRAST:  8mL OMNIPAQUE IOHEXOL 300 MG/ML  SOLN  COMPARISON:  Chest CT 10/26/2014  FINDINGS: Lower chest:  Lung bases are clear no pericardial fluid.  Hepatobiliary: There is a low-density lesion in the medial aspect of the  liver along the gallbladder fossa which likely represent benign cysts. There is high-density fluid along the right hepatic margin of the liver. No evidence of liver laceration.  Pancreas: Pancreas is normal. No ductal dilatation. No pancreatic inflammation.  Spleen: There is a linear lucency through the splenic hilum consistent laceration. Along the posterior border of the spleen is a second linear lucency with high-density contrast consistent laceration and active extravasation seen on image 19, series 2. There is high-density fluid surrounding the spleen consistent acute hemorrhage into the peritoneal space. Hemorrhage extends along the pericolic gutters and collects in the pelvis.  The spleen is deformed by the perisplenic hematoma seen on sagittal image 62. This may tamponading the spleen.  Adrenals/urinary tract: Adrenal glands are normal. The kidneys enhance  symmetrically. Ureters and bladder normal.  Stomach/Bowel: Stomach, small bowel, and colon are unremarkable. Large volume stool in the rectum.  Vascular/Lymphatic: Abdominal or is normal caliber with intimal calcification.  Reproductive: Post hysterectomy.  Pelvic lymphadenopathy.  Musculoskeletal: Heled rib fracture on the left laterally on image 1. No evidence of acute rib fracture. No evidence of spine fracture.  IMPRESSION: 1. Splenic laceration with active extravasation of IV contrast from the posterior margin of the liver. 2. Splenic contour is deformed by the perisplenic hematoma which may be tamponading the spleen. 3. Large volume of intraperitoneal hemorrhage from the splenic laceration. 4. No evidence acute fracture. 5. No evidence of liver laceration. Findings conveyed toCOURTNEY FORCUCCI on 10/26/2014  at13:00   Electronically Signed   By: Suzy Bouchard M.D.   On: 10/26/2014 13:09     EKG Interpretation   Date/Time:  Thursday October 26 2014 05:59:35 EST Ventricular Rate:  77 PR Interval:  139 QRS Duration: 82 QT Interval:   390 QTC Calculation: 441 R Axis:   75 Text Interpretation:  Sinus rhythm Nonspecific T abnormalities, anterior  leads No old tracing to compare Confirmed by OTTER  MD, OLGA ([id number]) on  10/26/2014 6:09:06 AM      MDM   Final diagnoses:  Chest pain  Elevated d-dimer  Abdominal pain  Splenic laceration, initial encounter  Intraperitoneal hemorrhage  Cerebellar ataxia  Fall, initial encounter   Patient is a 58 year old female presenting with left-sided chest pain 2 hours. Vital signs are stable. Physical exam reveals an alert and oriented female with no focal neurological deficits. There is generalized belly tenderness to palpation on examination with voluntary guarding. Chest x-ray reveals hyperinflation suggesting emphysema, no acute infiltrates. EKG reveals normal sinus rhythm with nonspecific T-wave abnormalities such as T-wave flattening, but there are no previous EKGs to compare to. CBC reveals hemoglobin of 8.4. Chart review reveals a hemoglobin of 10 on 10/20/2014 for her physical lab work. Patient is Hemoccult negative here today. I-STAT troponin is negative. LFTs are unremarkable. Lipase is unremarkable. D-dimer is elevated at 2.08. CT angiogram shows no evidence of PE. Radiologist did call and inform me that the patient had intraperitoneal hemorrhage seen on CT angiogram. He recommended CT abdomen and pelvis with contrast. CT abdomen and pelvis reveals splenic laceration with intraperitoneal hemorrhage. I have spoken with Dr. Grandville Silos from trauma surgery will see the patient here in the emergency room. Patient to be admitted to the trauma service and will have likely splenectomy at this time. Patient is nothing by mouth at this time. Patient has been seen and discussed with Dr. Sharol Given. I have also discussed this case with Dr. Ralene Bathe.    Cherylann Parr, PA-C 10/26/14 [place], MD 10/26/14 [phone]

## 2014-10-26 NOTE — Progress Notes (Signed)
Small stud ear ring in left ear unable to remove. Taped in place.

## 2014-10-26 NOTE — ED Notes (Addendum)
Pt nauseated; MD aware.

## 2014-10-26 NOTE — ED Notes (Signed)
Patient transported to CT 

## 2014-10-26 NOTE — ED Notes (Signed)
EDP at bedside  

## 2014-10-26 NOTE — ED Notes (Signed)
MD at bedside. 

## 2014-10-26 NOTE — Progress Notes (Signed)
Pt is able to tell me her name, and answers one word to questions. Pain face scale 10 -has had 1 .5mg  IV Dilaudid, though she seems groggy, shallow respirations. Dr Linna Caprice at bedside & aware.  Will cont to monitor closely.

## 2014-10-26 NOTE — ED Notes (Signed)
PA at bedside; Patient transported to CT without distress

## 2014-10-26 NOTE — ED Notes (Signed)
Pt was awoken from sleep by a stabbing pain in her left chest. Pain is non-radiating, and is worse with deep inspiration. Pt took 324mg  ASA at home.

## 2014-10-26 NOTE — Anesthesia Preprocedure Evaluation (Signed)
Anesthesia Evaluation  Patient identified by MRN, date of birth, ID band Patient awake    Reviewed: Allergy & Precautions, H&P , NPO status , Patient's Chart, lab work & pertinent test results  Airway Mallampati: II  TM Distance: <3 FB Neck ROM: Full    Dental  (+) Teeth Intact, Dental Advisory Given   Pulmonary Current Smoker,  breath sounds clear to auscultation        Cardiovascular Rhythm:Regular     Neuro/Psych PSYCHIATRIC DISORDERS Depression    GI/Hepatic   Endo/Other    Renal/GU      Musculoskeletal   Abdominal   Peds  Hematology  (+) anemia ,   Anesthesia Other Findings   Reproductive/Obstetrics                             Anesthesia Physical Anesthesia Plan  ASA: III and emergent  Anesthesia Plan: General   Post-op Pain Management:    Induction: Intravenous  Airway Management Planned: Oral ETT  Additional Equipment:   Intra-op Plan:   Post-operative Plan: Extubation in OR and Possible Post-op intubation/ventilation  Informed Consent:   Dental advisory given  Plan Discussed with:   Anesthesia Plan Comments:         Anesthesia Quick Evaluation

## 2014-10-26 NOTE — Op Note (Addendum)
10/26/2014  4:28 PM  PATIENT:  Sherry Zimmerman  58 y.o. female  PRE-OPERATIVE DIAGNOSIS:  Ruptured Spleen  POST-OPERATIVE DIAGNOSIS:  Ruptured Spleen  PROCEDURE:  Procedure(s): SPLENECTOMY  SURGEON:  Surgeon(s): Georganna Skeans, MD  ASSISTANTS: Coralie Keens, PAC   ANESTHESIA:   general  EBL:  Total I/O In: 7017 [Blood:1457] Out: 550 [Blood:550]  BLOOD ADMINISTERED:2U CC PRBC, 2U CC CELLSAVER and 175 FFP  DRAINS: none   SPECIMEN:  Excision  DISPOSITION OF SPECIMEN:  PATHOLOGY  COUNTS:  YES  DICTATION: .Dragon Dictation patient is brought for emergent splenectomy. She received intravenous antibiotics. Informed consent was obtained. She was brought to the operating room and general endotracheal anesthesia was administered by the anesthesia staff. Foley catheter was placed by nursing. Abdomen was prepped and draped in sterile fashion. We did a time out procedure. Upper midline incision was made. Subcutaneous tissues were dissected down revealing the anterior fascia. This was divided sharply along the midline. Peritoneal cavity was entered under direct vision carefully. There was moderate hemoperitoneum. Cell Saver was used to suction. Peritoneum was opened to the length of the skin incision. Several packs were placed in the left upper quadrant. Packs were removed and the spleen was mobilized from its lateral peritoneal attachments bluntly. There was a very large subcapsular splenic hematoma. Once was fully mobilized into the wound, the hilar vessels were sequentially clamped. The short gastrics were also clamped. In the spleen was removed. Clamps were left in place as we evacuated the remainder of hemoperitoneum for the Cell Saver. Once this was accomplished, the hilar clamps were sequentially suture-ligated with 2-0 silk. Some were done twice. Similarly the short gastrics were secured with 2-0 silk ligatures. There was good hemostasis. Packs were placed in the left upper quadrant.  Nasogastric tube was repositioned in the stomach. I recheck of the hilum revealed an additional bleeding area which suture-ligated. There is no further bleeding. Small bowel was run from the ligament of Treitz to the terminal ileum and no injuries were seen. Right transverse left colon appeared normal. Rectum and sigmoid appear normal. Stomach appeared normal. Liver was smooth without apparent injury. There are a couple liver cyst visible. Gallbladder was seen and intact. No significant retroperitoneal hematoma or lesser sac hematoma was noted. Hilar and short gastric vessels were rechecked and were completely dry. Abdomen was copiously irrigated with warm saline. This was evacuated and was clear. Bowel was returned to anatomic position. Counts were confirmed. Fascia was closed with 2 lengths of #1 looped PDS tied in the middle and the skin was closed with staples after irrigating the subcutaneous tissues. All counts were again correct. Sterile dressing was applied. Patient tolerated the procedure well without apparent complications and was extubated and taken to recovery with plans for ICU admission postoperatively. She was in stable condition.  PATIENT DISPOSITION:  PACU then ICU   Delay start of Pharmacological VTE agent (>24hrs) due to surgical blood loss or risk of bleeding:  yes  Georganna Skeans, MD, MPH, FACS Pager: [phone]  11/19/20154:28 PM

## 2014-10-26 NOTE — Plan of Care (Signed)
Problem: Consults Goal: General Surgical Patient Education (See Patient Education module for education specifics) Outcome: Completed/Met Date Met:  10/26/14

## 2014-10-26 NOTE — ED Notes (Signed)
pa at bedside. 

## 2014-10-26 NOTE — Transfer of Care (Signed)
Immediate Anesthesia Transfer of Care Note  Patient: Sherry Zimmerman  Procedure(s) Performed: Procedure(s): SPLENECTOMY (N/A)  Patient Location: PACU  Anesthesia Type:General  Level of Consciousness: awake and alert   Airway & Oxygen Therapy: Patient Spontanous Breathing and Patient connected to nasal cannula oxygen  Post-op Assessment: Report given to PACU RN, Post -op Vital signs reviewed and stable and Patient moving all extremities X 4  Post vital signs: Reviewed and stable  Complications: No apparent anesthesia complications

## 2014-10-26 NOTE — Progress Notes (Signed)
UR completed.  Jenness Stemler, RN BSN MHA CCM Trauma/Neuro ICU Case Manager [phone]  

## 2014-10-26 NOTE — ED Notes (Signed)
Pt. returned from XR. 

## 2014-10-26 NOTE — H&P (Signed)
[facility] Admission Note  Sherry Zimmerman 03-26-1956  [id number].    Requesting MD: Dr. Ralene Bathe Chief Complaint/Reason for Consult: fall, splenic rupture  HPI:  58 y/o white female with PMH anemia, cerebellar ataxia, and depression presents to [facility] as a non-trauma activation after a fall yesterday (10/25/14).  She states yesterday she fell which is not uncommon for her secondary to her cerebellar ataxia.  Her husband thinks she was getting up from the table and lost her balance and fell.  She landed on her bottom and must have caught a table leg.  She also complained of posterior head pain and left forearm pain.  She was fine yesterday and went on with her day, she even bathed all 3 dogs.  At 4:30am on 10/26/14 she was awoken with severe left upper chest pain which radiated to her left breast and upper abdomen.  The pain was sharp and stabbing.  Any movement or deep breathing made the pain severe.  She had no pain relief with 4 baby aspirin.  No other alleviating factors.  She denies any SOB, fever, chills, N/V, bowel or bladder problems.  She smokes every day.  H/o laparoscopic hysterectomy.   ROS: All systems reviewed and otherwise negative except for as above  History reviewed. No pertinent family history.  Past Medical History  Diagnosis Date  . Depression   . Cerebellar ataxia 2010  . Splenic rupture     Past Surgical History  Procedure Laterality Date  . Abdominal hysterectomy    . Wrist fracture surgery Right     Social History:  reports that she has been smoking.  She does not have any smokeless tobacco history on file. She reports that she drinks alcohol. She reports that she does not use illicit drugs.  Allergies:  Allergies  Allergen Reactions  . Morphine And Related Other (See Comments)  . Sulfa Antibiotics Nausea And Vomiting     (Not in a hospital admission)  Blood pressure 111/62, pulse 87, temperature 97.6 F (36.4 C), temperature source Oral,  resp. rate 17, height 5' 9"  (1.753 m), weight 127 lb (57.607 kg), SpO2 91 %. Physical Exam: General: pleasant, WD/WN white female who is laying in bed in NAD HEENT: head is normocephalic, tender to the posterior head, no hematoma.  Sclera are noninjected.  PERRL.  Ears and nose without any masses or lesions.  Mouth is pink but dry. Heart: regular, rate, and rhythm.  No obvious murmurs, gallops, or rubs noted.  Palpable pedal pulses bilaterally Lungs: CTAB, no wheezes, rhonchi, or rales noted.  Respiratory effort nonlabored. Abd: soft, ND, tender in the LUQ, +BS, no masses, hernias, or organomegaly, few laparoscopic surgical scars noted MS: all 4 extremities are symmetrical with no cyanosis, clubbing, or edema. Skin: warm and dry, +pallor. Psych: A&Ox3 with an appropriate affect.   Results for orders placed or performed during the hospital encounter of 10/26/14 (from the past 48 hour(s))  Basic metabolic panel     Status: Abnormal   Collection Time: 10/26/14  6:13 AM  Result Value Ref Range   Sodium 143 137 - 147 mEq/L   Potassium 4.1 3.7 - 5.3 mEq/L   Chloride 108 96 - 112 mEq/L   CO2 25 19 - 32 mEq/L   Glucose, Bld 126 (H) 70 - 99 mg/dL   BUN 15 6 - 23 mg/dL   Creatinine, Ser 0.79 0.50 - 1.10 mg/dL   Calcium 8.7 8.4 - 10.5 mg/dL   GFR calc non Af Wyvonnia Lora  89 (L) >90 mL/min   GFR calc Af Amer >90 >90 mL/min    Comment: (NOTE) The eGFR has been calculated using the CKD EPI equation. This calculation has not been validated in all clinical situations. eGFR's persistently <90 mL/min signify possible Chronic Kidney Disease.    Anion gap 10 5 - 15  CBC     Status: Abnormal   Collection Time: 10/26/14  6:13 AM  Result Value Ref Range   WBC 8.3 4.0 - 10.5 K/uL   RBC 3.69 (L) 3.87 - 5.11 MIL/uL   Hemoglobin 8.4 (L) 12.0 - 15.0 g/dL   HCT 28.7 (L) 36.0 - 46.0 %   MCV 77.8 (L) 78.0 - 100.0 fL   MCH 22.8 (L) 26.0 - 34.0 pg   MCHC 29.3 (L) 30.0 - 36.0 g/dL   RDW 16.2 (H) 11.5 - 15.5 %    Platelets 217 150 - 400 K/uL  I-stat troponin, ED     Status: None   Collection Time: 10/26/14  6:19 AM  Result Value Ref Range   Troponin i, poc 0.00 0.00 - 0.08 ng/mL   Comment 3            Comment: Due to the release kinetics of cTnI, a negative result within the first hours of the onset of symptoms does not rule out myocardial infarction with certainty. If myocardial infarction is still suspected, repeat the test at appropriate intervals.   Hepatic function panel     Status: Abnormal   Collection Time: 10/26/14  7:45 AM  Result Value Ref Range   Total Protein 5.9 (L) 6.0 - 8.3 g/dL   Albumin 3.3 (L) 3.5 - 5.2 g/dL   AST 13 0 - 37 U/L   ALT 10 0 - 35 U/L   Alkaline Phosphatase 72 39 - 117 U/L   Total Bilirubin <0.2 (L) 0.3 - 1.2 mg/dL   Bilirubin, Direct <0.2 0.0 - 0.3 mg/dL   Indirect Bilirubin NOT CALCULATED 0.3 - 0.9 mg/dL  Lipase, blood     Status: None   Collection Time: 10/26/14  7:45 AM  Result Value Ref Range   Lipase 25 11 - 59 U/L  D-dimer, quantitative     Status: Abnormal   Collection Time: 10/26/14  7:45 AM  Result Value Ref Range   D-Dimer, Quant 2.08 (H) 0.00 - 0.48 ug/mL-FEU    Comment:        AT THE INHOUSE ESTABLISHED CUTOFF VALUE OF 0.48 ug/mL FEU, THIS ASSAY HAS BEEN DOCUMENTED IN THE LITERATURE TO HAVE A SENSITIVITY AND NEGATIVE PREDICTIVE VALUE OF AT LEAST 98 TO 99%.  THE TEST RESULT SHOULD BE CORRELATED WITH AN ASSESSMENT OF THE CLINICAL PROBABILITY OF DVT / VTE.   POC occult blood, ED     Status: None   Collection Time: 10/26/14  8:09 AM  Result Value Ref Range   Fecal Occult Bld NEGATIVE NEGATIVE   Dg Chest 2 View  10/26/2014   CLINICAL DATA:  Weakness and shortness of breath. Chest pain since last night.  EXAM: CHEST  2 VIEW  COMPARISON:  None.  FINDINGS: Hyperinflation suggesting emphysema. No focal airspace disease or consolidation in the lungs. No blunting of costophrenic angles. No pneumothorax. Heart size and pulmonary vascularity  are normal. Old or healing fracture of the left eighth rib.  IMPRESSION: Hyperinflation suggesting emphysema. No evidence of active pulmonary disease.   Electronically Signed   By: Lucienne Capers M.D.   On: 10/26/2014 07:10   Ct Angio Chest  Pe W/cm &/or Wo Cm  10/26/2014   CLINICAL DATA:  Acute onset left-sided chest pain, worse with deep inspiration. Elevated D-dimer.  EXAM: CT ANGIOGRAPHY CHEST WITH CONTRAST  TECHNIQUE: Multidetector CT imaging of the chest was performed using the standard protocol during bolus administration of intravenous contrast. Multiplanar CT image reconstructions and MIPs were obtained to evaluate the vascular anatomy.  CONTRAST:  23m OMNIPAQUE IOHEXOL 350 MG/ML SOLN  COMPARISON:  Chest radiographs earlier today  FINDINGS: Pulmonary arterial opacification is adequate without filling defects identified to indicate emboli. Mild LAD coronary artery calcification is noted. Heart is normal in size. No enlarged axillary, mediastinal, or hilar lymph nodes are identified. There is no pleural or pericardial effusion.  There is moderate centrilobular emphysema. Minimal scarring is noted at the apices. There is slight nodularity in the posterior right middle lobe just anterior to the major fissure, with a 4 mm nodule best seen on coronal images (series 7, image 49). Minimal dependent atelectasis is present in the right lower lobe. Major airways are patent.  Visualized portion of the upper abdomen demonstrates enlargement and heterogeneity of the spleen with surrounding high density fluid consistent with blood. This is incompletely visualized. Perihepatic fluid is also present consistent with blood. There is a small sliding hiatal hernia. Old left eighth rib fracture is noted.  Review of the MIP images confirms the above findings.  IMPRESSION: 1. No evidence of pulmonary emboli or other acute abnormality in the chest. 2. Partial visualization of intraperitoneal hemorrhage in the upper abdomen  which appears to reflect splenic hemorrhage. Further evaluation with abdominal and pelvic CT is recommended. 3. 4 mm right middle lobe nodule. If the patient is at high risk for bronchogenic carcinoma, follow-up chest CT at 1 year is recommended. If the patient is at low risk, no follow-up is needed. This recommendation follows the consensus statement: Guidelines for Management of Small Pulmonary Nodules Detected on CT Scans: A Statement from the [place] published in Radiology 2005; 237:395-400. Critical Value/emergent results were called by telephone at the time of interpretation on 10/26/2014 at 11:42 am to [facility], who verbally acknowledged these results.   Electronically Signed   By: ALogan Bores  On: 10/26/2014 11:43   Ct Abdomen Pelvis W Contrast  10/26/2014   CLINICAL DATA:  Epigastric pain, drop in hemoglobin.  Recent fall.  EXAM: CT ABDOMEN AND PELVIS WITH CONTRAST  TECHNIQUE: Multidetector CT imaging of the abdomen and pelvis was performed using the standard protocol following bolus administration of intravenous contrast.  CONTRAST:  722mOMNIPAQUE IOHEXOL 300 MG/ML  SOLN  COMPARISON:  Chest CT 10/26/2014  FINDINGS: Lower chest:  Lung bases are clear no pericardial fluid.  Hepatobiliary: There is a low-density lesion in the medial aspect of the liver along the gallbladder fossa which likely represent benign cysts. There is high-density fluid along the right hepatic margin of the liver. No evidence of liver laceration.  Pancreas: Pancreas is normal. No ductal dilatation. No pancreatic inflammation.  Spleen: There is a linear lucency through the splenic hilum consistent laceration. Along the posterior border of the spleen is a second linear lucency with high-density contrast consistent laceration and active extravasation seen on image 19, series 2. There is high-density fluid surrounding the spleen consistent acute hemorrhage into the peritoneal space. Hemorrhage extends along  the pericolic gutters and collects in the pelvis.  The spleen is deformed by the perisplenic hematoma seen on sagittal image 62. This may tamponading the spleen.  Adrenals/urinary tract: Adrenal glands are normal. The kidneys enhance symmetrically. Ureters and bladder normal.  Stomach/Bowel: Stomach, small bowel, and colon are unremarkable. Large volume stool in the rectum.  Vascular/Lymphatic: Abdominal or is normal caliber with intimal calcification.  Reproductive: Post hysterectomy.  Pelvic lymphadenopathy.  Musculoskeletal: Heled rib fracture on the left laterally on image 1. No evidence of acute rib fracture. No evidence of spine fracture.  IMPRESSION: 1. Splenic laceration with active extravasation of IV contrast from the posterior margin of the liver. 2. Splenic contour is deformed by the perisplenic hematoma which may be tamponading the spleen. 3. Large volume of intraperitoneal hemorrhage from the splenic laceration. 4. No evidence acute fracture. 5. No evidence of liver laceration. Findings conveyed toCOURTNEY FORCUCCI on 10/26/2014  at13:00   Electronically Signed   By: Suzy Bouchard M.D.   On: 10/26/2014 13:09      Assessment/Plan Fall on 10/25/14 secondary to ataxia Splenic rupture with active extravasation ABL Anemia - Hgb 8.4 Anemia of unknown type Cerebellar ataxia Depression  Plan: 1.  Emergent laparotomy and splenectomy for rupture 2.  NPO, IVF, pain control, antiemetics, antibiotics (Ancef) 3.  Will need splenectomy vaccines prior to discharge 4.  Admit to trauma service, ICU monitoring post-op   Coralie Keens, [facility] 10/26/2014, 1:53 PM Pager: [phone]

## 2014-10-27 ENCOUNTER — Encounter (HOSPITAL_COMMUNITY): Payer: Self-pay | Admitting: General Surgery

## 2014-10-27 LAB — COMPREHENSIVE METABOLIC PANEL
ALT: 12 U/L (ref 0–35)
AST: 20 U/L (ref 0–37)
Albumin: 2.9 g/dL — ABNORMAL LOW (ref 3.5–5.2)
Alkaline Phosphatase: 65 U/L (ref 39–117)
Anion gap: 9 (ref 5–15)
BUN: 15 mg/dL (ref 6–23)
CO2: 26 mEq/L (ref 19–32)
Calcium: 8.2 mg/dL — ABNORMAL LOW (ref 8.4–10.5)
Chloride: 106 mEq/L (ref 96–112)
Creatinine, Ser: 0.7 mg/dL (ref 0.50–1.10)
GFR calc Af Amer: >90 mL/min (ref >90)
GFR calc non Af Amer: >90 mL/min (ref >90)
Glucose, Bld: 160 mg/dL — ABNORMAL HIGH (ref 70–99)
Potassium: 4 mEq/L (ref 3.7–5.3)
Sodium: 141 mEq/L (ref 137–147)
Total Bilirubin: 0.4 mg/dL (ref 0.3–1.2)
Total Protein: 5.5 g/dL — ABNORMAL LOW (ref 6.0–8.3)

## 2014-10-27 LAB — CBC
HCT: 29.4 % — ABNORMAL LOW (ref 36.0–46.0)
Hemoglobin: 9.2 g/dL — ABNORMAL LOW (ref 12.0–15.0)
MCH: 25.3 pg — ABNORMAL LOW (ref 26.0–34.0)
MCHC: 31.3 g/dL (ref 30.0–36.0)
MCV: 80.8 fL (ref 78.0–100.0)
Platelets: 162 10*3/uL (ref 150–400)
RBC: 3.64 MIL/uL — ABNORMAL LOW (ref 3.87–5.11)
RDW: 15.5 % (ref 11.5–15.5)
WBC: 16.9 10*3/uL — ABNORMAL HIGH (ref 4.0–10.5)

## 2014-10-27 MED ORDER — SODIUM CHLORIDE 0.9 % IJ SOLN: 9.0000 mL | INTRAMUSCULAR | Status: DC | PRN

## 2014-10-27 MED ORDER — DIPHENHYDRAMINE HCL 50 MG/ML IJ SOLN
12.5000 mg | Freq: Four times a day (QID) | INTRAMUSCULAR | Status: DC | PRN
  Administered 2014-10-29: 12.5 mg via INTRAVENOUS
  Filled 2014-10-27: qty 1

## 2014-10-27 MED ORDER — ONDANSETRON HCL 4 MG/2ML IJ SOLN: 4.0000 mg | Freq: Four times a day (QID) | INTRAMUSCULAR | Status: DC | PRN

## 2014-10-27 MED ORDER — VENLAFAXINE HCL ER 150 MG PO CP24
150.0000 mg | ORAL_CAPSULE | Freq: Every day | ORAL | Status: DC
  Administered 2014-10-27 – 2014-10-30 (×4): 150 mg via ORAL
  Filled 2014-10-27 (×5): qty 1

## 2014-10-27 MED ORDER — NALOXONE HCL 0.4 MG/ML IJ SOLN: 0.4000 mg | INTRAMUSCULAR | Status: DC | PRN

## 2014-10-27 MED ORDER — HYDROMORPHONE 0.3 MG/ML IV SOLN
INTRAVENOUS | Status: DC
  Administered 2014-10-27: 11:00:00 via INTRAVENOUS
  Administered 2014-10-27: 1.19 mg via INTRAVENOUS
  Administered 2014-10-27 (×2): 1.39 mg via INTRAVENOUS
  Administered 2014-10-28: 1.2 mg via INTRAVENOUS
  Administered 2014-10-28: 0.9777 mg via INTRAVENOUS
  Administered 2014-10-28: 1.39 mg via INTRAVENOUS
  Administered 2014-10-28: 05:00:00 via INTRAVENOUS
  Administered 2014-10-28 (×3): 1.19 mg via INTRAVENOUS
  Administered 2014-10-29: 1.79 mg via INTRAVENOUS
  Administered 2014-10-29: 1.2 mg via INTRAVENOUS
  Administered 2014-10-29: 0.99 mg via INTRAVENOUS
  Filled 2014-10-27 (×2): qty 25

## 2014-10-27 MED ORDER — DIPHENHYDRAMINE HCL 12.5 MG/5ML PO ELIX
12.5000 mg | ORAL_SOLUTION | Freq: Four times a day (QID) | ORAL | Status: DC | PRN
  Filled 2014-10-27: qty 5

## 2014-10-27 NOTE — Progress Notes (Signed)
Wasted 38 mL from Fentanyl syringe, 2nd RN Verified Charmayne Sheer RN.  Desmond Dike RN

## 2014-10-27 NOTE — Progress Notes (Signed)
Attempted to walk patient in room, patient with poor balance and complaining of pain 10/10 when walking 2 feet. Sat patient in chair and reeducated in the need for IS. Patient very cooperative with walking and Incentive spirometer 550 volumes.  Desmond Dike RN

## 2014-10-27 NOTE — Progress Notes (Signed)
Trauma Service Note  Subjective: Patient is awake and alert.  Having a lot of abdominal pain from the incision site.   Objective: Vital signs in last 24 hours: Temp:  [97 F (36.1 C)-98.3 F (36.8 C)] 98.3 F (36.8 C) (11/20 0828) Pulse Rate:  [75-109] 89 (11/20 0700) Resp:  [10-31] 19 (11/20 0828) BP: (107-157)/(56-91) 133/64 mmHg (11/20 0700) SpO2:  [91 %-100 %] 96 % (11/20 0828)    Intake/Output from previous day: 11/19 0701 - 11/20 0700 In: 3887 [I.V.:2430; Blood:1457] Out: 1350 [Urine:800; Blood:550] Intake/Output this shift:    General: Moderate acute distress  Lungs: Clear to auscultation.  IS up to 750cc.  Abd: Quiet bowel sounds.  Incision is clean and dry.  Extremities: No clinical signs or symptoms of DVT  Neuro: Intact.  Slurred speech from narc although still complaining of pain.  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 ABG No results for input(s): PHART, HCO3 in the last 72 hours.  Invalid input(s): PCO2, PO2  Studies/Results: Dg Chest 2 View  10/26/2014   CLINICAL DATA:  Weakness and shortness of breath. Chest pain since last night.  EXAM: CHEST  2 VIEW  COMPARISON:  None.  FINDINGS: Hyperinflation suggesting emphysema. No focal airspace disease or consolidation in the lungs. No blunting of costophrenic angles. No pneumothorax. Heart size and pulmonary vascularity are normal. Old or healing fracture of the left eighth rib.  IMPRESSION: Hyperinflation suggesting emphysema. No evidence of active pulmonary disease.   Electronically Signed   By: Lucienne Capers M.D.   On: 10/26/2014 07:10   Ct Angio Chest Pe W/cm &/or Wo Cm  10/26/2014   CLINICAL DATA:  Acute onset left-sided chest pain, worse with deep inspiration. Elevated D-dimer.  EXAM: CT ANGIOGRAPHY CHEST WITH CONTRAST  TECHNIQUE: Multidetector CT imaging of the chest was performed using the standard protocol during bolus administration of intravenous contrast. Multiplanar CT image reconstructions and MIPs were obtained to evaluate the vascular anatomy.  CONTRAST:  22mL OMNIPAQUE IOHEXOL 350 MG/ML SOLN  COMPARISON:  Chest radiographs earlier today  FINDINGS: Pulmonary arterial opacification is adequate without filling defects identified to indicate emboli. Mild LAD coronary artery calcification is noted. Heart is normal in size. No enlarged axillary, mediastinal, or hilar lymph nodes are identified. There is no pleural or pericardial effusion.  There is moderate centrilobular emphysema. Minimal scarring is noted at the apices. There is slight nodularity in the posterior right middle lobe just anterior to the major fissure, with a 4 mm nodule best seen on coronal images (series 7, image 49). Minimal dependent atelectasis is present in the right lower lobe. Major airways are patent.  Visualized portion of the upper abdomen demonstrates enlargement and heterogeneity of the spleen with surrounding high density fluid consistent with blood. This is incompletely visualized. Perihepatic fluid is also present consistent with blood. There is a small sliding hiatal hernia. Old left eighth rib fracture is noted.  Review of the MIP images confirms the above  findings.  IMPRESSION: 1. No evidence of pulmonary emboli or other acute abnormality in the chest. 2. Partial visualization of intraperitoneal hemorrhage in the upper abdomen which appears to reflect splenic hemorrhage. Further evaluation with abdominal and pelvic CT is recommended. 3. 4 mm right middle lobe nodule. If the patient is at high risk for bronchogenic  carcinoma, follow-up chest CT at 1 year is recommended. If the patient is at low risk, no follow-up is needed. This recommendation follows the consensus statement: Guidelines for Management of Small Pulmonary Nodules Detected on CT Scans: A Statement from the [place] as published in Radiology 2005; 237:395-400. Critical Value/emergent results were called by telephone at the time of interpretation on 10/26/2014 at 11:42 am to [facility] , who verbally acknowledged these results.   Electronically Signed   By: Logan Bores   On: 10/26/2014 11:43   Ct Abdomen Pelvis W Contrast  10/26/2014   CLINICAL DATA:  Epigastric pain, drop in hemoglobin.  Recent fall.  EXAM: CT ABDOMEN AND PELVIS WITH CONTRAST  TECHNIQUE: Multidetector CT imaging of the abdomen and pelvis was performed using the standard protocol following bolus administration of intravenous contrast.  CONTRAST:  59mL OMNIPAQUE IOHEXOL 300 MG/ML  SOLN  COMPARISON:  Chest CT 10/26/2014  FINDINGS: Lower chest:  Lung bases are clear no pericardial fluid.  Hepatobiliary: There is a low-density lesion in the medial aspect of the liver along the gallbladder fossa which likely represent benign cysts. There is high-density fluid along the right hepatic margin of the liver. No evidence of liver laceration.  Pancreas: Pancreas is normal. No ductal dilatation. No pancreatic inflammation.  Spleen: There is a linear lucency through the splenic hilum consistent laceration. Along the posterior border of the spleen is a second linear lucency with high-density contrast consistent laceration and active extravasation seen on image 19, series 2. There is high-density fluid surrounding the spleen consistent acute hemorrhage into the peritoneal space. Hemorrhage extends along the pericolic gutters and collects in the pelvis.  The spleen is deformed by the perisplenic hematoma seen on sagittal image 62. This may tamponading the spleen.  Adrenals/urinary tract:  Adrenal glands are normal. The kidneys enhance symmetrically. Ureters and bladder normal.  Stomach/Bowel: Stomach, small bowel, and colon are unremarkable. Large volume stool in the rectum.  Vascular/Lymphatic: Abdominal or is normal caliber with intimal calcification.  Reproductive: Post hysterectomy.  Pelvic lymphadenopathy.  Musculoskeletal: Heled rib fracture on the left laterally on image 1. No evidence of acute rib fracture. No evidence of spine fracture.  IMPRESSION: 1. Splenic laceration with active extravasation of IV contrast from the posterior margin of the liver. 2. Splenic contour is deformed by the perisplenic hematoma which may be tamponading the spleen. 3. Large volume of intraperitoneal hemorrhage from the splenic laceration. 4. No evidence acute fracture. 5. No evidence of liver laceration. Findings conveyed toCOURTNEY FORCUCCI on 10/26/2014  at13:00   Electronically Signed   By: Suzy Bouchard M.D.   On: 10/26/2014 13:09    Anti-infectives: Anti-infectives    Start     Dose/Rate Route Frequency Ordered Stop   10/26/14 1415  [MAR Hold]  ceFAZolin (ANCEF) IVPB 2 g/50 mL premix     (MAR Hold since 10/26/14 1441)   2 g100 mL/hr over 30 Minutes Intravenous  Once 10/26/14 1405 10/26/14 1528      Assessment/Plan: s/p Procedure(s): SPLENECTOMY May have ice chips  Remove foley Get out of bed. May transfer from ICU later today.  LOS: 1 day  Kathryne Eriksson. Dahlia Bailiff, MD, FACS [phone] Trauma Surgeon 10/27/2014

## 2014-10-28 LAB — PREPARE FRESH FROZEN PLASMA
Unit division: 0
Unit division: 0
Unit division: 0

## 2014-10-28 LAB — CBC WITH DIFFERENTIAL/PLATELET
Basophils Absolute: 0 10*3/uL (ref 0.0–0.1)
Basophils Relative: 0 % (ref 0–1)
Eosinophils Absolute: 0 10*3/uL (ref 0.0–0.7)
Eosinophils Relative: 0 % (ref 0–5)
HCT: 33.9 % — ABNORMAL LOW (ref 36.0–46.0)
Hemoglobin: 10.5 g/dL — ABNORMAL LOW (ref 12.0–15.0)
Lymphocytes Relative: 13 % (ref 12–46)
Lymphs Abs: 2.2 10*3/uL (ref 0.7–4.0)
MCH: 25.4 pg — ABNORMAL LOW (ref 26.0–34.0)
MCHC: 31 g/dL (ref 30.0–36.0)
MCV: 81.9 fL (ref 78.0–100.0)
Monocytes Absolute: 1.6 10*3/uL — ABNORMAL HIGH (ref 0.1–1.0)
Monocytes Relative: 9 % (ref 3–12)
Neutro Abs: 13.4 10*3/uL — ABNORMAL HIGH (ref 1.7–7.7)
Neutrophils Relative %: 78 % — ABNORMAL HIGH (ref 43–77)
Platelets: 189 10*3/uL (ref 150–400)
RBC: 4.14 MIL/uL (ref 3.87–5.11)
RDW: 16.4 % — ABNORMAL HIGH (ref 11.5–15.5)
WBC: 17.2 10*3/uL — ABNORMAL HIGH (ref 4.0–10.5)

## 2014-10-28 LAB — BASIC METABOLIC PANEL
Anion gap: 15 (ref 5–15)
BUN: 9 mg/dL (ref 6–23)
CO2: 22 mEq/L (ref 19–32)
Calcium: 8.7 mg/dL (ref 8.4–10.5)
Chloride: 101 mEq/L (ref 96–112)
Creatinine, Ser: 0.62 mg/dL (ref 0.50–1.10)
GFR calc Af Amer: >90 mL/min (ref >90)
GFR calc non Af Amer: >90 mL/min (ref >90)
Glucose, Bld: 119 mg/dL — ABNORMAL HIGH (ref 70–99)
Potassium: 4.1 mEq/L (ref 3.7–5.3)
Sodium: 138 mEq/L (ref 137–147)

## 2014-10-28 MED ORDER — ACETAMINOPHEN 325 MG PO TABS
650.0000 mg | ORAL_TABLET | Freq: Four times a day (QID) | ORAL | Status: DC | PRN
  Administered 2014-10-28: 650 mg via ORAL
  Filled 2014-10-28: qty 2

## 2014-10-28 NOTE — Progress Notes (Signed)
Patient ID: Sherry Zimmerman, female   DOB: Apr 28, 1956, 58 y.o.   MRN: [id number] 2 Days Post-Op  Subjective: Pt feeling ok.  C/o pain.  No flatus.  No nausea with NGT output.  Objective: Vital signs in last 24 hours: Temp:  [97.8 F (36.6 C)-98.8 F (37.1 C)] 97.8 F (36.6 C) (11/21 1000) Pulse Rate:  [94-115] 115 (11/21 1000) Resp:  [12-26] 18 (11/21 1000) BP: (126-156)/(65-111) 153/82 mmHg (11/21 1000) SpO2:  [95 %-99 %] 96 % (11/21 1000) FiO2 (%):  [4 %] 4 % (11/20 1743) Last BM Date: 10/24/14  Intake/Output from previous day: 11/20 0701 - 11/21 0700 In: 1481.5 [I.V.:1481.5] Out: 940 [Urine:640; Emesis/NG output:300] Intake/Output this shift: Total I/O In: 60 [P.O.:60] Out: 300 [Urine:300]  PE: Abd: soft, tender appropriately tender, hypoactive BS, but not distended, incision c/d/i Heart: regular Lungs: CTAB  Lab Results:   Recent Labs  10/27/14 0224 10/28/14 0515  WBC 16.9* 17.2*  HGB 9.2* 10.5*  HCT 29.4* 33.9*  PLT 162 189   BMET  Recent Labs  10/27/14 0224 10/28/14 0515  NA 141 138  K 4.0 4.1  CL 106 101  CO2 26 22  GLUCOSE 160* 119*  BUN 15 9  CREATININE 0.70 0.62  CALCIUM 8.2* 8.7   PT/INR No results for input(s): LABPROT, INR in the last 72 hours. CMP     Component Value Date/Time   NA 138 10/28/2014 0515   K 4.1 10/28/2014 0515   CL 101 10/28/2014 0515   CO2 22 10/28/2014 0515   GLUCOSE 119* 10/28/2014 0515   BUN 9 10/28/2014 0515   CREATININE 0.62 10/28/2014 0515   CALCIUM 8.7 10/28/2014 0515   PROT 5.5* 10/27/2014 0224   ALBUMIN 2.9* 10/27/2014 0224   AST 20 10/27/2014 0224   ALT 12 10/27/2014 0224   ALKPHOS 65 10/27/2014 0224   BILITOT 0.4 10/27/2014 0224   GFRNONAA >90 10/28/2014 0515   GFRAA >90 10/28/2014 0515   Lipase     Component Value Date/Time   LIPASE 25 10/26/2014 0745       Studies/Results: Ct Abdomen Pelvis W Contrast  10/26/2014   CLINICAL DATA:  Epigastric pain, drop in hemoglobin.  Recent fall.   EXAM: CT ABDOMEN AND PELVIS WITH CONTRAST  TECHNIQUE: Multidetector CT imaging of the abdomen and pelvis was performed using the standard protocol following bolus administration of intravenous contrast.  CONTRAST:  36mL OMNIPAQUE IOHEXOL 300 MG/ML  SOLN  COMPARISON:  Chest CT 10/26/2014  FINDINGS: Lower chest:  Lung bases are clear no pericardial fluid.  Hepatobiliary: There is a low-density lesion in the medial aspect of the liver along the gallbladder fossa which likely represent benign cysts. There is high-density fluid along the right hepatic margin of the liver. No evidence of liver laceration.  Pancreas: Pancreas is normal. No ductal dilatation. No pancreatic inflammation.  Spleen: There is a linear lucency through the splenic hilum consistent laceration. Along the posterior border of the spleen is a second linear lucency with high-density contrast consistent laceration and active extravasation seen on image 19, series 2. There is high-density fluid surrounding the spleen consistent acute hemorrhage into the peritoneal space. Hemorrhage extends along the pericolic gutters and collects in the pelvis.  The spleen is deformed by the perisplenic hematoma seen on sagittal image 62. This may tamponading the spleen.  Adrenals/urinary tract: Adrenal glands are normal. The kidneys enhance symmetrically. Ureters and bladder normal.  Stomach/Bowel: Stomach, small bowel, and colon are unremarkable. Large volume stool in the  rectum.  Vascular/Lymphatic: Abdominal or is normal caliber with intimal calcification.  Reproductive: Post hysterectomy.  Pelvic lymphadenopathy.  Musculoskeletal: Heled rib fracture on the left laterally on image 1. No evidence of acute rib fracture. No evidence of spine fracture.  IMPRESSION: 1. Splenic laceration with active extravasation of IV contrast from the posterior margin of the liver. 2. Splenic contour is deformed by the perisplenic hematoma which may be tamponading the spleen. 3. Large  volume of intraperitoneal hemorrhage from the splenic laceration. 4. No evidence acute fracture. 5. No evidence of liver laceration. Findings conveyed toCOURTNEY FORCUCCI on 10/26/2014  at13:00   Electronically Signed   By: Suzy Bouchard M.D.   On: 10/26/2014 13:09    Anti-infectives: Anti-infectives    Start     Dose/Rate Route Frequency Ordered Stop   10/26/14 1415  [MAR Hold]  ceFAZolin (ANCEF) IVPB 2 g/50 mL premix     (MAR Hold since 10/26/14 1441)   2 g100 mL/hr over 30 Minutes Intravenous  Once 10/26/14 1405 10/26/14 1528       Assessment/Plan  1. POD 2, s/p splenectomy 2. cerebellar ataxia   Plan: 1. Will give clear liquids today. 2. PT consult to help mobilize given history of cerebellar ataxia 3. Cont IVFs and PCA for now.  LOS: 2 days    Baden Betsch E 10/28/2014, 11:57 AM Pager: [phone]

## 2014-10-28 NOTE — Progress Notes (Signed)
@   0458 called to room by NT noted NG tube came out. Pt stated it came out accidentally while she was turning. Called on call Dr. Redmond Pulling, ordered leave NG tube out.

## 2014-10-29 MED ORDER — HYDROMORPHONE HCL 1 MG/ML IJ SOLN
0.5000 mg | INTRAMUSCULAR | Status: DC | PRN
Start: 2014-10-29 — End: 2014-10-30
  Administered 2014-10-29: 0.5 mg via INTRAVENOUS
  Administered 2014-10-29: 1 mg via INTRAVENOUS
  Filled 2014-10-29 (×2): qty 1

## 2014-10-29 MED ORDER — ENOXAPARIN SODIUM 40 MG/0.4ML ~~LOC~~ SOLN
40.0000 mg | SUBCUTANEOUS | Status: DC
  Administered 2014-10-29 – 2014-10-30 (×2): 40 mg via SUBCUTANEOUS
  Filled 2014-10-29 (×3): qty 0.4

## 2014-10-29 MED ORDER — OXYCODONE-ACETAMINOPHEN 5-325 MG PO TABS
1.0000 | ORAL_TABLET | ORAL | Status: DC | PRN
  Administered 2014-10-29 – 2014-10-30 (×6): 2 via ORAL
  Filled 2014-10-29 (×6): qty 2

## 2014-10-29 NOTE — Evaluation (Signed)
Occupational Therapy Evaluation Patient Details Name: Sherry Zimmerman MRN: [id number] DOB: 1956/06/18 Today's Date: 10/29/2014    History of Present Illness   58 y/o white female with PMH anemia, cerebellar ataxia, and depression presents to [facility] as a non-trauma activation after a fall yesterday (10/25/14). Found to have ruptured spleen, underwent emergent surgery 11/19   Clinical Impression   Pt s/p above. Pt independent with ADLs, PTA. Feel pt will benefit from acute OT to increase independence and strength prior to d/c.     Follow Up Recommendations  Home health OT;Supervision/Assistance - 24 hour    Equipment Recommendations  3 in 1 bedside comode    Recommendations for Other Services       Precautions / Restrictions Precautions Precautions: Fall Precaution Comments: hx ataxia Restrictions Weight Bearing Restrictions: No      Mobility Bed Mobility General bed mobility comments: not assessed  Transfers Overall transfer level: Needs assistance Equipment used: Rolling walker (2 wheeled) Transfers: Sit to/from Stand Sit to Stand: Min assist;Mod assist (Mod A for stand to sit to recliner chair)         General transfer comment: cues for technique. pt with fast descent to chair at end of session.    Balance Overall balance assessment: History of Falls (at least one fall every 2 weeks)                                          ADL Overall ADL's : Needs assistance/impaired     Grooming: Minimal assistance;Oral care;Wash/dry face;Standing;Sitting (OT assisted in taking out hair ties)               Lower Body Dressing: Min guard;Sit to/from stand   Toilet Transfer: Minimal assistance;Ambulation;RW;BSC           Functional mobility during ADLs: Minimal assistance;Rolling walker General ADL Comments: Min A for balance at sink. Educated on safety tips for home (safe shoewear, use of bag on walker, rugs, sitting for most of LB ADLs and  having walker in front when standing to pull up LB clothing). Educated on 3 in 1. Recommended someone being with her 24/7 for a little while. Recommended crossing legs over knees for LB bathing to help decrease pain in abdomen.     Vision                     Perception     Praxis      Pertinent Vitals/Pain Pain Assessment: 0-10 Pain Score: 9  Pain Location: abdomen Pain Intervention(s): Monitored during session     Hand Dominance     Extremity/Trunk Assessment Upper Extremity Assessment Upper Extremity Assessment: RUE deficits/detail;LUE deficits/detail RUE Coordination: decreased gross motor;decreased fine motor LUE Coordination: decreased gross motor;decreased fine motor   Lower Extremity Assessment Lower Extremity Assessment: Defer to PT evaluation       Communication Communication Communication: Other (comment) (dysarthria)   Cognition Arousal/Alertness: Awake/alert Behavior During Therapy: WFL for tasks assessed/performed Overall Cognitive Status: Within Functional Limits for tasks assessed                     General Comments       Exercises       Shoulder Instructions      Home Living Family/patient expects to be discharged to:: Private residence Living Arrangements: Other relatives;Spouse/significant other Available Help at Discharge: Family;Available 24 hours/day  Type of Home: Mobile home Home Access: Stairs to enter Entrance Stairs-Number of Steps: 3 Entrance Stairs-Rails: Right;Left Home Layout: One level     Bathroom Shower/Tub: Tub/shower unit;Walk-in shower   Bathroom Toilet: Standard     Home Equipment: Cane - single point;Hand held shower head;Shower seat - built in          Prior Functioning/Environment Level of Independence: Independent with assistive device(s)        Comments: able to walk and shop unimpeded at baseline using cane/grocery cart; used cane/walker at [employer]    OT Diagnosis: Generalized  weakness;Acute pain   OT Problem List: Decreased strength;Decreased range of motion;Impaired balance (sitting and/or standing);Decreased activity tolerance;Decreased knowledge of use of DME or AE;Decreased knowledge of precautions;Pain;Decreased coordination   OT Treatment/Interventions: Self-care/ADL training;Therapeutic exercise;DME and/or AE instruction;Therapeutic activities;Patient/family education;Balance training    OT Goals(Current goals can be found in the care plan section) Acute Rehab OT Goals Patient Stated Goal: not stated OT Goal Formulation: With patient Time For Goal Achievement: 11/05/14 Potential to Achieve Goals: Good ADL Goals Pt Will Perform Grooming: with set-up;standing Pt Will Perform Lower Body Dressing: with set-up;with supervision;sit to/from stand Pt Will Transfer to Toilet: with supervision;ambulating (3 in 1 over commode) Pt Will Perform Tub/Shower Transfer: Shower transfer;with supervision;ambulating;rolling walker;shower seat;3 in 1  OT Frequency: Min 2X/week   Barriers to D/C:            Co-evaluation              End of Session Equipment Utilized During Treatment: Gait belt;Rolling walker Nurse Communication: Mobility status  Activity Tolerance: Patient limited by pain Patient left: in chair;with call bell/phone within reach   Time: 1431-1455 OT Time Calculation (min): 24 min Charges:  OT General Charges $OT Visit: 1 Procedure OT Evaluation $Initial OT Evaluation Tier I: 1 Procedure OT Treatments $Self Care/Home Management : 8-22 mins G-CodesBenito Mccreedy OTR/L [phone] 10/29/2014, 4:02 PM

## 2014-10-29 NOTE — Progress Notes (Signed)
Patient ID: Sherry Zimmerman, female   DOB: [birth year], 58 y.o.   MRN: [id number] 3 Days Post-Op  Subjective: Pt doing well, except c/o pain at incision.  Has only gotten up to chair, but has not ambulated yet.  Tolerating clear liquids.  No nausea or vomiting.  No flatus yet  Objective: Vital signs in last 24 hours: Temp:  [97.4 F (36.3 C)-98 F (36.7 C)] 98 F (36.7 C) (11/22 0637) Pulse Rate:  [69-115] 69 (11/22 0637) Resp:  [12-19] 16 (11/22 0800) BP: (122-153)/(56-82) 131/69 mmHg (11/22 0637) SpO2:  [95 %-100 %] 95 % (11/22 0800) Last BM Date: 10/24/14  Intake/Output from previous day: 11/21 0701 - 11/22 0700 In: 2282 [P.O.:300; I.V.:1982] Out: 2000 [Urine:2000] Intake/Output this shift: Total I/O In: 360 [P.O.:360] Out: -   PE: Abd: soft, ND, +BS, appropriately tender, incision is clean with staples Heart: regular Lungs: CTAB  Lab Results:   Recent Labs  10/27/14 0224 10/28/14 0515  WBC 16.9* 17.2*  HGB 9.2* 10.5*  HCT 29.4* 33.9*  PLT 162 189   BMET  Recent Labs  10/27/14 0224 10/28/14 0515  NA 141 138  K 4.0 4.1  CL 106 101  CO2 26 22  GLUCOSE 160* 119*  BUN 15 9  CREATININE 0.70 0.62  CALCIUM 8.2* 8.7   PT/INR No results for input(s): LABPROT, INR in the last 72 hours. CMP     Component Value Date/Time   NA 138 10/28/2014 0515   K 4.1 10/28/2014 0515   CL 101 10/28/2014 0515   CO2 22 10/28/2014 0515   GLUCOSE 119* 10/28/2014 0515   BUN 9 10/28/2014 0515   CREATININE 0.62 10/28/2014 0515   CALCIUM 8.7 10/28/2014 0515   PROT 5.5* 10/27/2014 0224   ALBUMIN 2.9* 10/27/2014 0224   AST 20 10/27/2014 0224   ALT 12 10/27/2014 0224   ALKPHOS 65 10/27/2014 0224   BILITOT 0.4 10/27/2014 0224   GFRNONAA >90 10/28/2014 0515   GFRAA >90 10/28/2014 0515   Lipase     Component Value Date/Time   LIPASE 25 10/26/2014 0745       Studies/Results: No results found.  Anti-infectives: Anti-infectives    Start     Dose/Rate Route Frequency  Ordered Stop   10/26/14 1415  [MAR Hold]  ceFAZolin (ANCEF) IVPB 2 g/50 mL premix     (MAR Hold since 10/26/14 1441)   2 g100 mL/hr over 30 Minutes Intravenous  Once 10/26/14 1405 10/26/14 1528       Assessment/Plan 1. POD 3, s/p splenectomy 2. cerebellar ataxia   Plan: 1. Patient has got to get up and mobilize.  PT has been ordered but has no seen her yet.  She needs to mobilize at least TID in the halls with nursing. 2. Patient hasn't passed flatus yet, but has great BS and tolerating clear liquids.  Will advance to full liquids. 3. DC PCA and start oral pain meds and some IV for breakthrough 4. She will need her vaccines prior to discharge or with her PCP in 2 weeks if she has one.   LOS: 3 days    Sherry Zimmerman 10/29/2014, 9:19 AM Pager: [phone]

## 2014-10-29 NOTE — Evaluation (Signed)
Physical Therapy Evaluation Patient Details Name: Sherry Zimmerman MRN: [id number] DOB: [birth year] Today's Date: 10/29/2014   History of Present Illness    58 y/o white female with PMH anemia, cerebellar ataxia, and depression presents to [facility] as a non-trauma activation after a fall yesterday (10/25/14). Found to have ruptured spleen, underwent emergent surgery 11/19  She states 11/18 she fell which is not uncommon for her secondary to her cerebellar ataxia. Her husband thinks she was getting up from the table and lost her balance and fell. She landed on her bottom and must have caught a table leg. She also complained of posterior head pain and left forearm pain. She was fine yesterday and went on with her day, she even bathed all 3 dogs. At 4:30am on 10/26/14 she was awoken with severe left upper chest pain which radiated to her left breast and upper abdomen. The pain was sharp and stabbing. Any movement or deep breathing made the pain severe. She had no pain relief with 4 baby aspirin. No other alleviating factors. She denies any SOB, fever, chills, N/V, bowel or bladder problems. She smokes every day. H/o laparoscopic hysterectomy.   Clinical Impression  Pt presents with severe limitations to functional mobility related to pain, decreased trunk mobility/ROM and complicated by hx of cerebellar ataxia and prolonged hx falls.  Will benefit from PT to address deficits in order to decrease risk of postoperative complications and optimal healing/recovery.  Recommend nursing assist with multiple short bouts of walking daily -- pt only able to tolerate 45 feet of walking before overwhelmed by pain.  Plan to initiate PT in acute setting and currently recommend referral to [place], however will re-assess as pain management is optimized and better controlled.      Follow Up Recommendations Home health PT    Equipment Recommendations       Recommendations for Other Services       Precautions /  Restrictions Precautions Precautions: Fall Precaution Comments: hx ataxia      Mobility  Bed Mobility Overal bed mobility: Needs Assistance Bed Mobility: Sit to Sidelying;Rolling Rolling: Mod assist       Sit to sidelying: Mod assist General bed mobility comments: needs verbal/demonstrational cues to use optimal strategy to manage abdominal pain.    Transfers Overall transfer level: Needs assistance Equipment used: Rolling walker (2 wheeled) Transfers: Sit to/from Stand Sit to Stand: Min assist         General transfer comment: cues for optimal technique to protect surgical site and to minimize risk for falling  Ambulation/Gait Ambulation/Gait assistance: Min assist Ambulation Distance (Feet): 45 Feet Assistive device: Rolling walker (2 wheeled) Gait Pattern/deviations: Step-through pattern;Ataxic;Leaning posteriorly;Antalgic;Wide base of support;Decreased stride length   Gait velocity interpretation: Below normal speed for age/gender General Gait Details: 6 min walk test informally assessed with pt only able to cover 40 feet in 6 minutes.  stiff legs, wide base, painful, dependent on RW (roughly .1 ft/sec)  [employer] Rankin (Stroke Patients Only)       Balance Overall balance assessment: History of Falls (at least 1 fall every 2 weeks)                                           Pertinent Vitals/Pain Pain Assessment: 0-10 Pain Score: 8  Pain Location:  abdomen Pain Intervention(s): Limited activity within patient's tolerance;Monitored during session;Premedicated before session;Repositioned;Relaxation    Home Living Family/patient expects to be discharged to:: Private residence Living Arrangements: Other relatives;Spouse/significant other Available Help at Discharge: Family;Available 24 hours/day Type of Home: Mobile home Home Access: Stairs to enter Entrance Stairs-Rails: Can reach  both Entrance Stairs-Number of Steps: 3 Home Layout: One level Home Equipment: Walker - 2 wheels (3-wheeled walker)      Prior Function Level of Independence: Independent with assistive device(s)         Comments: able to walk and shop unimpeded at baseline using cane/grocery cart     Hand Dominance        Extremity/Trunk Assessment   Upper Extremity Assessment: Defer to OT evaluation           Lower Extremity Assessment: Overall WFL for tasks assessed         Communication   Communication: Other (comment) (dysarthria)  Cognition Arousal/Alertness: Awake/alert Behavior During Therapy: WFL for tasks assessed/performed Overall Cognitive Status: Within Functional Limits for tasks assessed                      General Comments General comments (skin integrity, edema, etc.): unable to visualize incision.  pt education on risk of adhesions as well as dehiscence    Exercises        Assessment/Plan    PT Assessment Patient needs continued PT services  PT Diagnosis Difficulty walking;Acute pain   PT Problem List Pain;Decreased mobility;Decreased activity tolerance;Decreased range of motion  PT Treatment Interventions DME instruction;Gait training;Stair training;Functional mobility training;Therapeutic activities;Balance training;Patient/family education   PT Goals (Current goals can be found in the Care Plan section) Acute Rehab PT Goals Patient Stated Goal: get back to normal PT Goal Formulation: With patient Time For Goal Achievement: 11/12/14 Potential to Achieve Goals: Good    Frequency Min 3X/week   Barriers to discharge        Co-evaluation               End of Session   Activity Tolerance: Patient limited by pain Patient left: in bed;with call bell/phone within reach Nurse Communication: Mobility status;Patient requests pain meds         Time: 1324-4010 PT Time Calculation (min) (ACUTE ONLY): 28 min   Charges:   PT  Evaluation $Initial PT Evaluation Tier I: 1 Procedure PT Treatments $Gait Training: 8-22 mins $Therapeutic Activity: 8-22 mins   PT G Codes:          Herbie Drape 10/29/2014, 3:31 PM

## 2014-10-30 DIAGNOSIS — Y92009 Unspecified place in unspecified non-institutional (private) residence as the place of occurrence of the external cause: Secondary | ICD-10-CM

## 2014-10-30 DIAGNOSIS — W19XXXA Unspecified fall, initial encounter: Secondary | ICD-10-CM

## 2014-10-30 LAB — TYPE AND SCREEN
ABO/RH(D): O NEG
Antibody Screen: NEGATIVE
Unit division: 0
Unit division: 0
Unit division: 0
Unit division: 0

## 2014-10-30 LAB — BASIC METABOLIC PANEL
Anion gap: 11 (ref 5–15)
BUN: 7 mg/dL (ref 6–23)
CO2: 25 mEq/L (ref 19–32)
Calcium: 8.8 mg/dL (ref 8.4–10.5)
Chloride: 104 mEq/L (ref 96–112)
Creatinine, Ser: 0.67 mg/dL (ref 0.50–1.10)
GFR calc Af Amer: >90 mL/min (ref >90)
GFR calc non Af Amer: >90 mL/min (ref >90)
Glucose, Bld: 112 mg/dL — ABNORMAL HIGH (ref 70–99)
Potassium: 4.9 mEq/L (ref 3.7–5.3)
Sodium: 140 mEq/L (ref 137–147)

## 2014-10-30 LAB — CBC
HCT: 28.8 % — ABNORMAL LOW (ref 36.0–46.0)
HCT: 29.9 % — ABNORMAL LOW (ref 36.0–46.0)
Hemoglobin: 8.8 g/dL — ABNORMAL LOW (ref 12.0–15.0)
Hemoglobin: 9.4 g/dL — ABNORMAL LOW (ref 12.0–15.0)
MCH: 25.1 pg — ABNORMAL LOW (ref 26.0–34.0)
MCH: 26 pg (ref 26.0–34.0)
MCHC: 30.6 g/dL (ref 30.0–36.0)
MCHC: 31.4 g/dL (ref 30.0–36.0)
MCV: 82.1 fL (ref 78.0–100.0)
MCV: 82.8 fL (ref 78.0–100.0)
Platelets: 348 10*3/uL (ref 150–400)
Platelets: 398 10*3/uL (ref 150–400)
RBC: 3.51 MIL/uL — ABNORMAL LOW (ref 3.87–5.11)
RBC: 3.61 MIL/uL — ABNORMAL LOW (ref 3.87–5.11)
RDW: 17.2 % — ABNORMAL HIGH (ref 11.5–15.5)
RDW: 17.1 % — ABNORMAL HIGH (ref 11.5–15.5)
WBC: 9 10*3/uL (ref 4.0–10.5)
WBC: 10.3 10*3/uL (ref 4.0–10.5)

## 2014-10-30 LAB — BLOOD PRODUCT ORDER (VERBAL) VERIFICATION

## 2014-10-30 MED ORDER — METHOCARBAMOL 750 MG PO TABS
750.0000 mg | ORAL_TABLET | Freq: Three times a day (TID) | ORAL | Status: DC
  Administered 2014-10-30 – 2014-10-31 (×4): 750 mg via ORAL
  Filled 2014-10-30 (×6): qty 1

## 2014-10-30 MED ORDER — BOOST PLUS PO LIQD
237.0000 mL | Freq: Three times a day (TID) | ORAL | Status: DC
  Administered 2014-10-30 – 2014-10-31 (×2): 237 mL via ORAL
  Filled 2014-10-30 (×8): qty 237

## 2014-10-30 MED ORDER — ACETAMINOPHEN 325 MG PO TABS: 650.0000 mg | ORAL_TABLET | Freq: Four times a day (QID) | ORAL | Status: DC | PRN

## 2014-10-30 MED ORDER — OXYCODONE-ACETAMINOPHEN 5-325 MG PO TABS: 1.0000 | ORAL_TABLET | Freq: Four times a day (QID) | ORAL | Status: DC | PRN

## 2014-10-30 MED ORDER — DSS 100 MG PO CAPS: 100.0000 mg | ORAL_CAPSULE | Freq: Two times a day (BID) | ORAL | Status: DC

## 2014-10-30 MED ORDER — HYDROMORPHONE HCL 1 MG/ML IJ SOLN: 0.5000 mg | INTRAMUSCULAR | Status: DC | PRN

## 2014-10-30 MED ORDER — BISACODYL 10 MG RE SUPP
10.0000 mg | Freq: Every day | RECTAL | Status: DC
  Filled 2014-10-30: qty 1

## 2014-10-30 MED ORDER — METHOCARBAMOL 750 MG PO TABS: 750.0000 mg | ORAL_TABLET | Freq: Three times a day (TID) | ORAL | Status: DC

## 2014-10-30 MED ORDER — BOOST PLUS PO LIQD: 237.0000 mL | Freq: Three times a day (TID) | ORAL | Status: DC

## 2014-10-30 MED FILL — Sodium Chloride IV Soln 0.9%: INTRAVENOUS | Qty: 1000 | Status: AC

## 2014-10-30 MED FILL — Heparin Sodium (Porcine) Inj 1000 Unit/ML: INTRAMUSCULAR | Qty: 30 | Status: AC

## 2014-10-30 MED FILL — Sodium Chloride Irrigation Soln 0.9%: Qty: 3000 | Status: AC

## 2014-10-30 NOTE — Plan of Care (Signed)
Problem: Phase II Progression Outcomes Goal: Pain controlled Outcome: Completed/Met Date Met:  10/30/14

## 2014-10-30 NOTE — Plan of Care (Signed)
Problem: Phase II Progression Outcomes Goal: Return of bowel function (flatus, BM) IF ABDOMINAL SURGERY:  Outcome: Completed/Met Date Met:  10/30/14

## 2014-10-30 NOTE — Progress Notes (Signed)
Physical Therapy Treatment Patient Details Name: Sherry Zimmerman MRN: [id number] DOB: 05/16/1956 Today's Date: 10/30/2014    History of Present Illness 58 y/o white female with PMH anemia, cerebellar ataxia, and depression presents to [facility] as a non-trauma activation after a fall yesterday (10/25/14).  Found to have ruptured spleen, underwent emergent surgery 11/19    PT Comments    Patient progressing well with mobility. Improved ambulation distance today with Min A at times for balance/safety. Tolerated stair negotiation with assist. Strength seems to be improving from previous session and pain is more controlled allowing for more activity. Continues to exhibit balance deficits due to ataxia however pt well aware of increased risk for falls and uses furniture for support. Will continue to follow and progress as tolerated.   Follow Up Recommendations  Home health PT;Supervision for mobility/OOB     Equipment Recommendations  None recommended by PT    Recommendations for Other Services       Precautions / Restrictions Precautions Precautions: Fall Precaution Comments: hx ataxia Restrictions Weight Bearing Restrictions: No    Mobility  Bed Mobility Overal bed mobility: Needs Assistance Bed Mobility: Supine to Sit Rolling: Modified independent (Device/Increase time)   Supine to sit: Modified independent (Device/Increase time);HOB elevated   Sit to sidelying: Supervision General bed mobility comments: Pt quickly exiting bed due to needing to go to bathroom. Instructed pt on log roll technique for future to assist with pain control in abdomen.  Transfers Overall transfer level: Needs assistance Equipment used: None Transfers: Sit to/from Stand Sit to Stand: Min guard         General transfer comment: Min guard for safety/steadying upon standing from EOB and from toilet.  Ambulation/Gait Ambulation/Gait assistance: Min assist Ambulation Distance (Feet): 100 Feet (+  75' + 100') Assistive device: None Gait Pattern/deviations: Step-to pattern;Decreased stride length;Ataxic   Gait velocity interpretation: Below normal speed for age/gender General Gait Details: Pt unsteady during gait reaching for furniture/rail in hallway for support. Slow, waddling like gait pattern due to ataxia. Declined using RW today. Min A for balance/steadying. Mutliple seated rest breaks due to fatigue.   Stairs Stairs: Yes Stairs assistance: Min guard Stair Management: Step to pattern;One rail Right Number of Stairs: 4 General stair comments: Min guard for safety during stair negotiation. BUEs on rail for support.  Wheelchair Mobility    Modified Rankin (Stroke Patients Only)       Balance Overall balance assessment: History of Falls;Needs assistance Sitting-balance support: Feet supported;No upper extremity supported Sitting balance-Leahy Scale: Fair Sitting balance - Comments: Independent for pericare sitting on toilet. No LOB reaching outside BoS.   Standing balance support: During functional activity Standing balance-Leahy Scale: Fair Standing balance comment: Tolerated short distances without external support with Min A for balance however rquired furniture/rail for balance during gait. Able to perform dynamic standing-washing hands without UE support for short time.                    Cognition Arousal/Alertness: Awake/alert Behavior During Therapy: WFL for tasks assessed/performed Overall Cognitive Status: Within Functional Limits for tasks assessed                      Exercises      General Comments        Pertinent Vitals/Pain Pain Assessment: 0-10 Pain Score: 3  Pain Location: surgical site Pain Descriptors / Indicators: Sore Pain Intervention(s): Limited activity within patient's tolerance;Monitored during session;Repositioned    Home Living  Prior Function            PT Goals (current goals  can now be found in the care plan section) Acute Rehab PT Goals Patient Stated Goal: not stated Progress towards PT goals: Progressing toward goals    Frequency  Min 3X/week    PT Plan Current plan remains appropriate    Co-evaluation             End of Session Equipment Utilized During Treatment: Gait belt Activity Tolerance: Patient tolerated treatment well Patient left: in bed;with call bell/phone within reach;with nursing/sitter in room (sitting EOB with RN present.)     Time: 8527-7824 PT Time Calculation (min) (ACUTE ONLY): 30 min  Charges:  $Gait Training: 23-37 mins                    G CodesCandy Sledge A November 04, 2014, 4:36 PM Candy Sledge, [place], DPT [phone]

## 2014-10-30 NOTE — Discharge Summary (Signed)
[facility] Trauma Service Discharge Summary   Patient ID: Sherry Zimmerman MRN: [id number] DOB/AGE: [birth year] 58 y.o.  Admit date: 10/26/2014 Discharge date: 10/31/2014  Discharge Diagnoses Patient Active Problem List   Diagnosis Date Noted  . Fall at home 10/30/2014  . 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 Grandville Silos) - Splenectomy  Hospital Course:  58 y/o white female with PMH anemia, cerebellar ataxia, and depression presents to [facility] as a non-trauma activation after a fall yesterday (10/25/14). She states yesterday she fell which is not uncommon for her secondary to her cerebellar ataxia. Her husband thinks she was getting up from the table and lost her balance and fell. She landed on her bottom and must have caught a table leg. She also complained of posterior head pain and left forearm pain. She was fine yesterday and went on with her day, she even bathed all 3 dogs. At 4:30am on 10/26/14 she was awoken with severe left upper chest pain which radiated to her left breast and upper abdomen. The pain was sharp and stabbing. Any movement or deep breathing made the pain severe. She had no pain relief with 4 baby aspirin. No other alleviating factors. She denies any SOB, fever, chills, N/V, bowel or bladder problems. She smokes every day. H/o laparoscopic hysterectomy.  Workup showed ruptured spleen with active extravisation.  Patient was admitted and emergently underwent procedure listed above.  Tolerated procedure well and was transferred to the floor.  She experienced post-operative anemia and was treated with several units of pRBC's and FFP.  She also experienced a postoperative ileus which resolved in a normal course.  Diet was advanced as tolerated.  On POD#5, the she was voiding well, tolerating diet, ambulating well, pain well controlled, vital signs  stable, incisions c/d/i and felt stable for discharge home.  Patient will follow up in our office on 11/08/14 and knows to call before that with questions or concerns.      Medication List    TAKE these medications        acetaminophen 325 MG tablet  Commonly known as:  TYLENOL  Take 2-3 tablets (650-975 mg total) by mouth every 6 (six) hours as needed for mild pain, moderate pain or headache.     CALCIUM 500 PO  Take 1 tablet by mouth daily.     DSS 100 MG Caps  Take 100 mg by mouth 2 (two) times daily.     EFFEXOR PO  Take by mouth.     venlafaxine XR 150 MG 24 hr capsule  Commonly known as:  EFFEXOR-XR  Take 150 mg by mouth at bedtime.     lactose free nutrition Liqd  Take 237 mLs by mouth 3 (three) times daily with meals.     methocarbamol 750 MG tablet  Commonly known as:  ROBAXIN  Take 1 tablet (750 mg total) by mouth 3 (three) times daily.     oxyCODONE 5 MG immediate release tablet  Commonly known as:  Oxy IR/ROXICODONE  Take 1-2 tablets (5-10 mg total) by mouth every 4 (four) hours as needed for moderate pain, severe pain or breakthrough pain.              VITAMIN D (CHOLECALCIFEROL) PO  Take 1 tablet by mouth daily.         Follow-up Information    Follow up with ANDY,CAMILLE L, MD. Go on 11/09/2014.  Specialty:  Family Medicine   Why:  Your appointment with Dr. Jonni Sanger is at 10:45am, arrive by 10:30am to check in.   Contact information:   [place] [id number] [phone]       Follow up with [facility]. Go on 11/08/2014.   Why:  For post-operation check and staple removal at 2:30pm, please arrive by 2:00pm to check in and fill out paperwork., For post-operation check   Contact information:   [address] [place] [address] [phone]       Signed: Nehemiah Massed. [place], [facility]  Trauma Service [phone]  10/31/2014, 9:51 AM

## 2014-10-30 NOTE — Progress Notes (Signed)
[facility] Trauma [facility]  Progress Note   LOS: 4 days   Subjective: Pt doing well, but c/o pain as the pca stopped.  C/o muscle spasms.  Tolerating fulls well.  No N/V.  No BM yet.  Urinating well.  C/o incisional pain.    Objective: Vital signs in last 24 hours: Temp:  [98 F (36.7 C)-98.9 F (37.2 C)] 98 F (36.7 C) (11/23 0546) Pulse Rate:  [66-92] 84 (11/23 0546) Resp:  [13-17] 17 (11/23 0546) BP: (113-134)/(66-91) 134/79 mmHg (11/23 0546) SpO2:  [90 %-99 %] 95 % (11/23 0546) Last BM Date: 10/24/14  Lab Results:  CBC  Recent Labs  10/28/14 0515  WBC 17.2*  HGB 10.5*  HCT 33.9*  PLT 189   BMET  Recent Labs  10/28/14 0515  NA 138  K 4.1  CL 101  CO2 22  GLUCOSE 119*  BUN 9  CREATININE 0.62  CALCIUM 8.7    Imaging: No results found.   PE: General: pleasant, WD/WN white female who is laying in bed in NAD HEENT: head is normocephalic, atraumatic.  Sclera are noninjected.  PERRL.  Ears and nose without any masses or lesions.  Mouth is pink and moist Heart: regular, rate, and rhythm.  Normal s1,s2. No obvious murmurs, gallops, or rubs noted.  Palpable radial and pedal pulses bilaterally Lungs: CTAB, no wheezes, rhonchi, or rales noted.  Respiratory effort nonlabored Abd: soft, ND, tender to palpation over incision, ecchymosis to skin, +BS, no masses, hernias, or organomegaly MS: all 4 extremities are symmetrical with no cyanosis, clubbing, or edema. Skin: warm and dry with no masses, lesions, or rashes Psych: A&Ox3 with an appropriate affect.   Assessment/Plan: Fall on 10/25/14 secondary to ataxia  Splenic rupture with active extravasation - s/p Emergent Ex lap with splenectomy ABL Anemia - Hgb 10.5, check H/H today to see if stable Anemia of unknown type Cerebellar ataxia - PT Depression Leukocytosis - 17.2, recheck VTE - SCD's, started Lovenox  FEN - on fulls, add boost, orals for pain Tobacco use - hold off on patch for now, she's  interested in quitting Dispo -- Needs vaccines with PCP at POD #14, staples out 11/08/14 at trauma clinic   Coralie Keens, [place] Pager: Cleona PA Pager: [phone]   10/30/2014

## 2014-10-30 NOTE — Plan of Care (Signed)
Problem: Phase II Progression Outcomes Goal: Progressing with IS, TCDB Outcome: Completed/Met Date Met:  10/30/14 Goal: Vital signs stable Outcome: Completed/Met Date Met:  10/30/14 Goal: Dressings dry/intact Outcome: Completed/Met Date Met:  10/30/14 Goal: Foley discontinued Outcome: Completed/Met Date Met:  10/30/14

## 2014-10-30 NOTE — Discharge Instructions (Addendum)
1.  Keep your appointment with Dr. Jonni Sanger on 11/09/14 for your post-splenectomy vaccinations (H.Flu, 13 valent pneumococcal, and meningitis vaccines).  You will need the 23 valent pneumococcal vaccine 8 weeks after your 13 valent pneumococcal vaccine.  ------------------------------------------------------------------------------------------------  [facility], [place] [phone]  OPEN ABDOMINAL SURGERY: POST OP INSTRUCTIONS  Always review your discharge instruction sheet given to you by the facility where your surgery was performed.  IF YOU HAVE DISABILITY OR FAMILY LEAVE FORMS, YOU MUST BRING THEM TO THE OFFICE FOR PROCESSING.  PLEASE DO NOT GIVE THEM TO YOUR DOCTOR.  1. A prescription for pain medication may be given to you upon discharge.  Take your pain medication as prescribed, if needed.  If narcotic pain medicine is not needed, then you may take acetaminophen (Tylenol) or ibuprofen (Advil) as needed. 2. Take your usually prescribed medications unless otherwise directed. 3. If you need a refill on your pain medication, please contact your pharmacy. They will contact our office to request authorization.  Prescriptions will not be filled after 5pm or on week-ends. 4. You should follow a light diet the first few days after arrival home, such as soup and crackers, pudding, etc.unless your doctor has advised otherwise. A high-fiber, low fat diet can be resumed as tolerated.   Be sure to include lots of fluids daily. Most patients will experience some swelling and bruising on the chest and neck area.  Ice packs will help.  Swelling and bruising can take several days to resolve 5. Most patients will experience some swelling and bruising in the area of the incision. Ice pack will help. Swelling and bruising can take several days to resolve..  6. It is common to experience some constipation if taking pain medication after surgery.  Increasing fluid intake and taking a stool softener  will usually help or prevent this problem from occurring.  A mild laxative (Milk of Magnesia or Miralax) should be taken according to package directions if there are no bowel movements after 48 hours. 7.  You may have steri-strips (small skin tapes) in place directly over the incision.  These strips should be left on the skin for 7-10 days.  If your surgeon used skin glue on the incision, you may shower in 24 hours.  The glue will flake off over the next 2-3 weeks.  Any sutures or staples will be removed at the office during your follow-up visit. You may find that a light gauze bandage over your incision may keep your staples from being rubbed or pulled. You may shower and replace the bandage daily. 8. ACTIVITIES:  You may resume regular (light) daily activities beginning the next day--such as daily self-care, walking, climbing stairs--gradually increasing activities as tolerated.  You may have sexual intercourse when it is comfortable.  Refrain from any heavy lifting or straining until approved by your doctor. a. You may drive when you no longer are taking prescription pain medication, you can comfortably wear a seatbelt, and you can safely maneuver your car and apply brakes b. Return to Work: ___________________________________ 67. You should see your doctor in the office for a follow-up appointment approximately two weeks after your surgery.  Make sure that you call for this appointment within a day or two after you arrive home to insure a convenient appointment time. OTHER INSTRUCTIONS:  _____________________________________________________________ _____________________________________________________________  WHEN TO CALL YOUR DOCTOR: 1. Fever over 101.0 2. Inability to urinate 3. Nausea and/or vomiting 4. Extreme swelling or bruising 5.  Continued bleeding from incision. 6. Increased pain, redness, or drainage from the incision. 7. Difficulty swallowing or breathing 8. Muscle cramping or  spasms. 9. Numbness or tingling in hands or feet or around lips.  The clinic staff is available to answer your questions during regular business hours.  Please dont hesitate to call and ask to speak to one of the nurses if you have concerns.  For further questions, please visit [email or web address]  Open Splenectomy An open splenectomy is surgery to remove your spleen.The spleen is a small organ. It is located on the left side of your abdomen, just below your lung. The spleen has several jobs. It helps fight infection by acting like a giant lymph node. The spleen mayneed to be taken out if it is damaged in a crash or other trauma.It may also need to be taken out if you have certain diseases. Idiopathic thrombocytopenia purpura (ITP) is one such disease. This disease causes your spleen to destroy too many blood cells that are needed for clotting. The procedure is called "open" because your surgeon will open your abdomen with a cut (incision). LET YOUR CAREGIVER KNOW ABOUT:  Any allergies.  All medicines you are taking, including:  Herbs, eyedrops, over-the-counter medicines, and creams.  Blood thinners (anticoagulants), aspirin, or other drugs that could affect blood clotting.  Use of steroids (by mouth or as creams).  Previous problems with anesthesia.  Possibility of pregnancy, if this applies.  Any history of blood clots.  Any history of bleeding or other blood problems.  Previous surgery.  Other health problems. RISKS AND COMPLICATIONS  Bleeding.  Damage to other organs near the spleen, especially the large intestine, stomach, or pancreas.  Infection. This could occur:  At the incision.  Where the spleen was located, especially if there was damage to the nearby organs.  In the lungs. This is called pneumonia.  In your whole body. This is called overwhelming post-splenectomy infection (OPSI). It occurs only in 1 out of 10,000 adults who have had their  spleen removed.  A blood clot that forms somewhere away from the surgery, such as in the legs, arms, or other big veins. Blood clots may rarely travel to the lung.  A hernia. This occurs when the incision does not heal correctly, causing a bulge near the incision. BEFORE THE PROCEDURE  A medical evaluation will be done. This may include:  A physical exam.  Tests to make sure you are healthy enough for the procedure. These may include blood tests, tests on your heart, and X-rays.  Talking with the person who will be in charge of the anesthesia during the procedure. Ask what you can expect.  If you smoke, quit a few weeks before the procedure.  A week before the procedure, stop taking drugs that can cause bleeding during and after your procedure.  This includes aspirin and pain medicines called NSAIDs (nonsteroidal anti-inflammatory drugs), such as ibuprofen and naproxen. Also stop taking vitamin E.  If you take any other prescription blood thinners, ask your surgeon when you should stop taking them.  You may be given vaccinations to help prevent infections. This may be needed because not having a spleen makes certain infections more dangerous.  Sometimes, blood or platelet transfusions may be needed.  The night before your procedure, do not eat or drink anything after midnight.  Ask your caregiver about changing or stopping your regular medicines.  Ask your caregiver if you need to arrive early before the procedure. You might need  another procedure before your splenectomy. This procedure is done to clot off the blood vessels that carry blood to your spleen. PROCEDURE An open splenectomy can take about 2 hours or more, depending on your body.  You will be given medicine that makes you sleep (general anesthetic).  Once you are asleep, the surgeon will make an incision. This may be done in the middle of your abdomen or under your rib cage.  The surgeon will find the spleen. The  surgeon will also look for extra spleen tissue in the area, which some people have.  The spleen and any extra spleen tissue that may be found will be taken out.  Your surgeon may decide to leave a drain. This is a small tube that comes out of the skin, near the incision.  The surgeon will close the abdomen.  A bandage (dressing) will be put over the incision. AFTER THE PROCEDURE  You will stay in a recovery area until the anesthesia has worn off.Your blood pressure and pulse will be monitored. Then you will be taken to your room.  Pain is normal after a splenectomy. You will be given pain medicine. Be sure to tell you caregiver if the pain gets worse or the pain medicine is not working.  You may be asked to get up and start walking within a day.This helps your intestines start working again. It also helps keep blood clots from forming in your legs.  Most people stay in the hospital for several days after this procedure. Then you can go home to continue getting better. Most people who have an open splenectomy fully recover in 4 to 6 weeks. Document Released: 03/21/2011 Document Revised: 03/21/2013 Document Reviewed: 03/21/2011 [facility] Patient Information 2015 [place], [place]. This information is not intended to replace advice given to you by your health care provider. Make sure you discuss any questions you have with your health care provider.  Splenectomy, Long-Term Care After A splenectomy is the surgical removal of a diseased or injured spleen. The most common reasons for the spleen to be removed are because of severe injury (trauma), sickle cell disease, or a condition that causes blood clotting problems (idiopathic thrombocytopenic purpura, ITP). The spleen is an organ located in the upper abdomen under your left ribs. It is a spongelike organ, about the size of an orange, which acts as a filter. The spleen removes waste from the blood, maintains cells that make antibodies to help fight  infection, and stores other blood cells. The spleen, along with other body systems and organs, plays an important role in the body's natural defense system (immune system). Once it is removed, there is a slightly greater chance of developing a serious, life-threatening infection (overwhelming postsplenectomy sepsis). It is important to take extra steps to prevent infection. Other precautions may be necessary to prevent blood clots from forming. PREVENTING INFECTION Your caregiver will recommend important steps to help prevent infection. These may include:  Making sure your immunizations are up to date, including:  Pneumococcus.  Seasonal flu (influenza).  Haemophilus influenzae type b (Hib).  Meningitis.  Making sure family members' vaccines are up to date.  In some cases, antibiotics may be needed if you get symptoms of infection. Not all patients need this type of treatment after a splenectomy. Talk to your caregiver about what is best for you if these symptoms develop.  Following good daily practices to prevent infection, such as:  Washing hands often, especially after preparing food, eating, changing diapers, and playing  with children or animals.  Disinfecting surfaces regularly.  Avoiding others with active illness or infections.  Taking precautions to avoid mosquito and tick bites, such as:  Wearing proper clothing that covers the entire body when you are in wooded or marshy areas.  Changing clothing right away and checking for bites after you have been outside.  Using insect spray.  Using insect netting.  Staying indoors during peak mosquito hours.  Taking precautions to avoid dog bites.  You may be at increased risk for rare infections associated with dog bites after splenectomy. PREVENTING BLOOD CLOTS Splenectomy may increase your risk of forming a blood clot. This is of utmost concern immediately after surgery. However, it may continue as a lifelong risk. To help  prevent blood clots, your caregiver may recommend:  Exercising as directed. Find a safe, regular exercise program that works well for you.  Getting up, stretching, and moving around every hour if you sit a lot or do not move about much at work or during travel time.  Taking medicines (such as aspirin) as directed. TRAVEL If you travel in the [place] take care to avoid tick bites, especially in the [place] areas. Ticks can spread the parasite that causes babesiosis. Babesiosis is a flu-like illness that is treatable. If you travel abroad where malaria is common, follow these guidelines:  Contact your caregiver and discuss the places you are visiting for specific advice.  Make sure all of your immunizations are up to date.  Get specific immunizations to guard against the disease risk in the country you are visiting.  Understand how to prevent infections, such as malaria, abroad. These infections can pose serious risk. Precautions may include:  Daily tablets to prevent malaria.  Using mosquito nets.  Using insect spray.  Bring your broad-spectrum, full-strength antibiotics with you. HOME CARE INSTRUCTIONS   Take all medicines as directed. If you are prescribed antibiotics, discuss with your caregiver the use of a probiotic supplement to prevent stomach upset.  Keep track of medicine refills so that you do not run out of medicine.  Inform your close contacts of your condition. Consider wearing a medical alert bracelet or carrying an ID card.  See your primary care physicians for vaccinations 14 days after your surgery..          No lifting, pushing or pulling over 10 pounds for six weeks.          Use your walking assist device at all times.          You may shower.          Appointment in [facility] on 11/08/14 at 2:00 pm for staple removal.  Please call [phone] for instructions on how to get there. SEEK MEDICAL CARE IF:   You have a fever  over 101 degrees, problems with your incision, persistent vomiting.  You develop signs of infection, such as chills and feeling unwell, that continue after taking the full-strength antibiotic.  You are considering travel abroad.  You are bitten by a tick or a dog.  You have questions or concerns. MAKE SURE YOU:   Understand these instructions.  Will watch your condition.  Will get help right away if you are not doing well or get worse. Document Released: 05/14/2010 Document Revised: 04/10/2014 Document Reviewed: 05/14/2010 [facility] Patient Information 2015 [place], [place]. This information is not intended to replace advice given to you by your health care provider. Make sure you discuss any questions you  have with your health care provider. ° °

## 2014-10-30 NOTE — Progress Notes (Signed)
Occupational Therapy Treatment Patient Details Name: Sherry Zimmerman MRN: [id number] DOB: 10-30-1956 Today's Date: 10/30/2014    History of present illness 58 y/o white female with PMH anemia, cerebellar ataxia, and depression presents to [facility] as a non-trauma activation after a fall yesterday (10/25/14).  Found to have ruptured spleen, underwent emergent surgery 11/19   OT comments  Pt making steady progress. Will have adequate support for D/C home. Recommend continuing with  HHOT and initial 24/7 S. Needs 3 in 1 for home use.   Follow Up Recommendations  Home health OT;Supervision/Assistance - 24 hour    Equipment Recommendations  3 in 1 bedside comode    Recommendations for Other Services      Precautions / Restrictions Precautions Precautions: Fall Precaution Comments: hx ataxia       Mobility Bed Mobility     Rolling: Modified independent (Device/Increase time)       Sit to sidelying: Supervision    Transfers                 General transfer comment: Pt had just walked with PT and requested to rest in bed. Discussed safe transer techniques and need to use 3 in 1 after D/C. Pt verbalized understanding.    Balance                                   ADL Overall ADL's : Needs assistance/impaired                                       General ADL Comments: Educated pt on using trunk support and support in general to compensate for ataxia. discussed alternative positions for dressing and safety during transitional movements. Pt given theratubing to assist with holding and manipulating utensils/pen,etc. given elastic shoe laces due to difficulty with tying shoes. Discussed role of HHOT and to work with [facility] to address her individual needs within the home.      Vision                     Perception     Praxis      Cognition   Behavior During Therapy: WFL for tasks assessed/performed Overall Cognitive Status: Within  Functional Limits for tasks assessed                       Extremity/Trunk Assessment               Exercises     Shoulder Instructions       General Comments      Pertinent Vitals/ Pain       Pain Assessment: 0-10 Pain Score: 7  Pain Location: abdomen Pain Descriptors / Indicators: Aching Pain Intervention(s): Limited activity within patient's tolerance  Home Living                                          Prior Functioning/Environment              Frequency Min 2X/week     Progress Toward Goals  OT Goals(current goals can now be found in the care plan section)  Progress towards OT goals: Progressing toward goals  Acute Rehab OT Goals Patient Stated  Goal: not stated OT Goal Formulation: With patient Time For Goal Achievement: 11/05/14 Potential to Achieve Goals: Good ADL Goals Pt Will Perform Grooming: with set-up;standing Pt Will Perform Lower Body Dressing: with set-up;with supervision;sit to/from stand Pt Will Transfer to Toilet: with supervision;ambulating Pt Will Perform Tub/Shower Transfer: Shower transfer;with supervision;ambulating;rolling walker;shower seat;3 in 1  Plan Discharge plan remains appropriate    Co-evaluation                 End of Session     Activity Tolerance Patient tolerated treatment well   Patient Left in bed;with call bell/phone within reach;with family/visitor present   Nurse Communication Mobility status        Time: 4503-8882 OT Time Calculation (min): 16 min  Charges: OT General Charges $OT Visit: 1 Procedure OT Treatments $Self Care/Home Management : 8-22 mins  Denilson Salminen,HILLARY 10/30/2014, 4:14 PM   [facility], OTR/L  [phone] 10/30/2014

## 2014-10-30 NOTE — Clinical Social Work Psychosocial (Signed)
Clinical Social Work Department BRIEF PSYCHOSOCIAL ASSESSMENT 10/30/2014  Patient:  Sherry Zimmerman, Sherry Zimmerman     Account Number:  [id number]     Admit date:  10/26/2014  Clinical Social Worker:  Domenica Reamer, [place]  Date/Time:  10/30/2014 10:00 AM  Referred by:  Physician  Date Referred:  10/30/2014 Referred for  Substance Abuse   Other Referral:   Interview type:  Patient Other interview type:    PSYCHOSOCIAL DATA Living Status:  [place] Admitted from facility:   Level of care:   Primary support name:  Ammie Ferrier Primary support relationship to patient:  PARTNER Degree of support available:   Patient reports high level of support from brother and boyfriend who live with her.    CURRENT CONCERNS Current Concerns  None Noted   Other Concerns:    SOCIAL WORK ASSESSMENT / PLAN CSW completed SBIRT assessment with patient.  Patient reports that she drinks very infrequently (less than once a month) and never engages in binge drinking.  Patient states that she lives with her brother and her boyfriend who she feels as if will be able to take care of her at home- stated that they will be preparing thanksgiving dinner for her since she is unable to help cook.   Assessment/plan status:  Psychosocial Support/Ongoing Assessment of Needs Other assessment/ plan:   none   Information/referral to community resources:   none    PATIENT'S/FAMILY'S RESPONSE TO PLAN OF CARE: Patient was very forthcoming with information during the assessment and states that she wants to return home as soon as she can.       Domenica Reamer, [place] Social Worker [phone]

## 2014-10-30 NOTE — Plan of Care (Signed)
Problem: Phase II Progression Outcomes Goal: Tolerating diet Outcome: Completed/Met Date Met:  10/30/14     

## 2014-10-31 LAB — CBC
HCT: 28.2 % — ABNORMAL LOW (ref 36.0–46.0)
Hemoglobin: 8.7 g/dL — ABNORMAL LOW (ref 12.0–15.0)
MCH: 25.4 pg — ABNORMAL LOW (ref 26.0–34.0)
MCHC: 30.9 g/dL (ref 30.0–36.0)
MCV: 82.5 fL (ref 78.0–100.0)
Platelets: 383 10*3/uL (ref 150–400)
RBC: 3.42 MIL/uL — ABNORMAL LOW (ref 3.87–5.11)
RDW: 17.6 % — ABNORMAL HIGH (ref 11.5–15.5)
WBC: 8.9 10*3/uL (ref 4.0–10.5)

## 2014-10-31 MED ORDER — OXYCODONE HCL 5 MG PO TABS: 5.0000 mg | ORAL_TABLET | ORAL | Status: DC | PRN

## 2014-10-31 NOTE — Progress Notes (Signed)
5 Days Post-Op  Subjective: Eating some.  Bowels moving.  Objective: Vital signs in last 24 hours: Temp:  [98.2 F (36.8 C)-98.6 F (37 C)] 98.6 F (37 C) (11/24 0522) Pulse Rate:  [90-95] 90 (11/24 0522) Resp:  [17] 17 (11/24 0522) BP: (114-136)/(58-80) 125/69 mmHg (11/24 0522) SpO2:  [92 %-96 %] 92 % (11/24 0522) Last BM Date: 10/30/14  Intake/Output from previous day: 11/23 0701 - 11/24 0700 In: 2033.5 [P.O.:1560; I.V.:473.5] Out: -  Intake/Output this shift:    PE: General- In NAD Abdomen-soft, incision clean and intact  Lab Results:   Recent Labs  10/30/14 1242 10/31/14 0430  WBC 10.3 8.9  HGB 9.4* 8.7*  HCT 29.9* 28.2*  PLT 398 383   BMET  Recent Labs  10/30/14 0859  NA 140  K 4.9  CL 104  CO2 25  GLUCOSE 112*  BUN 7  CREATININE 0.67  CALCIUM 8.8   PT/INR No results for input(s): LABPROT, INR in the last 72 hours. Comprehensive Metabolic Panel:    Component Value Date/Time   NA 140 10/30/2014 0859   NA 138 10/28/2014 0515   K 4.9 10/30/2014 0859   K 4.1 10/28/2014 0515   CL 104 10/30/2014 0859   CL 101 10/28/2014 0515   CO2 25 10/30/2014 0859   CO2 22 10/28/2014 0515   BUN 7 10/30/2014 0859   BUN 9 10/28/2014 0515   CREATININE 0.67 10/30/2014 0859   CREATININE 0.62 10/28/2014 0515   GLUCOSE 112* 10/30/2014 0859   GLUCOSE 119* 10/28/2014 0515   CALCIUM 8.8 10/30/2014 0859   CALCIUM 8.7 10/28/2014 0515   AST 20 10/27/2014 0224   AST 13 10/26/2014 0745   ALT 12 10/27/2014 0224   ALT 10 10/26/2014 0745   ALKPHOS 65 10/27/2014 0224   ALKPHOS 72 10/26/2014 0745   BILITOT 0.4 10/27/2014 0224   BILITOT <0.2* 10/26/2014 0745   PROT 5.5* 10/27/2014 0224   PROT 5.9* 10/26/2014 0745   ALBUMIN 2.9* 10/27/2014 0224   ALBUMIN 3.3* 10/26/2014 0745     Studies/Results: No results found.  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 Instructions given. Needs vaccines with PCP at [facility], staples out 11/08/14 at trauma clinic      Edmon Magid J 10/31/2014

## 2014-10-31 NOTE — Progress Notes (Signed)
Noted that patient has discharged home and the recommended PT/OT and DME (3-in-1 BSC) were not ordered and thus not arranged.  I have placed a call to the patient's home and left a voicemail offering to make these arrangements and left my direct number as well.  Await return call.   Sandi Mariscal, RN BSN MHA CCM  Case Manager, Trauma Service/Unit 93[facility] [phone]

## 2014-10-31 NOTE — Progress Notes (Signed)
Discharge home. Home discharge instruction given, incision CDI. No questions verbalized .

## 2014-11-09 DIAGNOSIS — N3281 Overactive bladder: Secondary | ICD-10-CM | POA: Insufficient documentation

## 2016-01-22 DIAGNOSIS — G119 Hereditary ataxia, unspecified: Secondary | ICD-10-CM | POA: Diagnosis not present

## 2016-05-29 DIAGNOSIS — Z1231 Encounter for screening mammogram for malignant neoplasm of breast: Secondary | ICD-10-CM | POA: Diagnosis not present

## 2016-05-29 LAB — HM MAMMOGRAPHY

## 2016-06-12 DIAGNOSIS — Z720 Tobacco use: Secondary | ICD-10-CM | POA: Diagnosis not present

## 2016-06-12 DIAGNOSIS — R0781 Pleurodynia: Secondary | ICD-10-CM | POA: Diagnosis not present

## 2016-06-12 DIAGNOSIS — M940 Chondrocostal junction syndrome [Tietze]: Secondary | ICD-10-CM | POA: Diagnosis not present

## 2016-09-26 DIAGNOSIS — S82002A Unspecified fracture of left patella, initial encounter for closed fracture: Secondary | ICD-10-CM | POA: Diagnosis not present

## 2016-10-10 DIAGNOSIS — Z23 Encounter for immunization: Secondary | ICD-10-CM | POA: Diagnosis not present

## 2016-10-10 DIAGNOSIS — S82035D Nondisplaced transverse fracture of left patella, subsequent encounter for closed fracture with routine healing: Secondary | ICD-10-CM | POA: Diagnosis not present

## 2016-10-20 DIAGNOSIS — S82035D Nondisplaced transverse fracture of left patella, subsequent encounter for closed fracture with routine healing: Secondary | ICD-10-CM | POA: Diagnosis not present

## 2016-11-03 DIAGNOSIS — S82035D Nondisplaced transverse fracture of left patella, subsequent encounter for closed fracture with routine healing: Secondary | ICD-10-CM | POA: Diagnosis not present

## 2016-12-05 DIAGNOSIS — N3281 Overactive bladder: Secondary | ICD-10-CM | POA: Diagnosis not present

## 2016-12-05 DIAGNOSIS — Z720 Tobacco use: Secondary | ICD-10-CM | POA: Diagnosis not present

## 2016-12-05 DIAGNOSIS — F418 Other specified anxiety disorders: Secondary | ICD-10-CM | POA: Diagnosis not present

## 2016-12-18 DIAGNOSIS — G119 Hereditary ataxia, unspecified: Secondary | ICD-10-CM | POA: Diagnosis not present

## 2016-12-18 DIAGNOSIS — G43009 Migraine without aura, not intractable, without status migrainosus: Secondary | ICD-10-CM | POA: Diagnosis not present

## 2017-05-14 DIAGNOSIS — Z0000 Encounter for general adult medical examination without abnormal findings: Secondary | ICD-10-CM | POA: Diagnosis not present

## 2017-05-14 DIAGNOSIS — F418 Other specified anxiety disorders: Secondary | ICD-10-CM | POA: Diagnosis not present

## 2017-05-14 DIAGNOSIS — N3281 Overactive bladder: Secondary | ICD-10-CM | POA: Diagnosis not present

## 2017-05-14 DIAGNOSIS — I70203 Unspecified atherosclerosis of native arteries of extremities, bilateral legs: Secondary | ICD-10-CM | POA: Diagnosis not present

## 2017-05-14 DIAGNOSIS — Z1159 Encounter for screening for other viral diseases: Secondary | ICD-10-CM | POA: Diagnosis not present

## 2017-05-14 DIAGNOSIS — G112 Late-onset cerebellar ataxia: Secondary | ICD-10-CM | POA: Diagnosis not present

## 2017-05-14 DIAGNOSIS — Z720 Tobacco use: Secondary | ICD-10-CM | POA: Diagnosis not present

## 2017-05-15 LAB — HM HEPATITIS C SCREENING LAB: HM Hepatitis Screen: NEGATIVE

## 2017-06-11 DIAGNOSIS — H2513 Age-related nuclear cataract, bilateral: Secondary | ICD-10-CM | POA: Diagnosis not present

## 2017-06-11 DIAGNOSIS — H524 Presbyopia: Secondary | ICD-10-CM | POA: Diagnosis not present

## 2017-06-11 DIAGNOSIS — H52222 Regular astigmatism, left eye: Secondary | ICD-10-CM | POA: Diagnosis not present

## 2017-06-11 DIAGNOSIS — H5212 Myopia, left eye: Secondary | ICD-10-CM | POA: Diagnosis not present

## 2017-06-11 DIAGNOSIS — H5500 Unspecified nystagmus: Secondary | ICD-10-CM | POA: Diagnosis not present

## 2017-06-11 DIAGNOSIS — H5211 Myopia, right eye: Secondary | ICD-10-CM | POA: Diagnosis not present

## 2017-09-11 DIAGNOSIS — Z23 Encounter for immunization: Secondary | ICD-10-CM | POA: Diagnosis not present

## 2017-12-22 ENCOUNTER — Encounter (HOSPITAL_BASED_OUTPATIENT_CLINIC_OR_DEPARTMENT_OTHER): Payer: Self-pay | Admitting: Emergency Medicine

## 2017-12-22 ENCOUNTER — Emergency Department (HOSPITAL_BASED_OUTPATIENT_CLINIC_OR_DEPARTMENT_OTHER)
Admission: EM | Admit: 2017-12-22 | Discharge: 2017-12-22 | Disposition: A | Discharge status: Home / Self Care | Payer: Medicare Other | Attending: Emergency Medicine | Admitting: Emergency Medicine

## 2017-12-22 ENCOUNTER — Emergency Department (HOSPITAL_BASED_OUTPATIENT_CLINIC_OR_DEPARTMENT_OTHER): Payer: Medicare Other

## 2017-12-22 ENCOUNTER — Other Ambulatory Visit: Payer: Self-pay

## 2017-12-22 DIAGNOSIS — Y939 Activity, unspecified: Secondary | ICD-10-CM | POA: Insufficient documentation

## 2017-12-22 DIAGNOSIS — R238 Other skin changes: Secondary | ICD-10-CM | POA: Diagnosis not present

## 2017-12-22 DIAGNOSIS — Y999 Unspecified external cause status: Secondary | ICD-10-CM | POA: Insufficient documentation

## 2017-12-22 DIAGNOSIS — T148XXA Other injury of unspecified body region, initial encounter: Secondary | ICD-10-CM

## 2017-12-22 DIAGNOSIS — Y929 Unspecified place or not applicable: Secondary | ICD-10-CM | POA: Diagnosis not present

## 2017-12-22 DIAGNOSIS — S060X0A Concussion without loss of consciousness, initial encounter: Secondary | ICD-10-CM

## 2017-12-22 DIAGNOSIS — S199XXA Unspecified injury of neck, initial encounter: Secondary | ICD-10-CM | POA: Diagnosis not present

## 2017-12-22 DIAGNOSIS — S0990XA Unspecified injury of head, initial encounter: Secondary | ICD-10-CM | POA: Diagnosis not present

## 2017-12-22 DIAGNOSIS — S0993XA Unspecified injury of face, initial encounter: Secondary | ICD-10-CM | POA: Diagnosis not present

## 2017-12-22 DIAGNOSIS — F17200 Nicotine dependence, unspecified, uncomplicated: Secondary | ICD-10-CM | POA: Insufficient documentation

## 2017-12-22 DIAGNOSIS — Z79899 Other long term (current) drug therapy: Secondary | ICD-10-CM | POA: Diagnosis not present

## 2017-12-22 DIAGNOSIS — S0031XA Abrasion of nose, initial encounter: Secondary | ICD-10-CM | POA: Diagnosis not present

## 2017-12-22 DIAGNOSIS — W19XXXA Unspecified fall, initial encounter: Secondary | ICD-10-CM

## 2017-12-22 DIAGNOSIS — W010XXA Fall on same level from slipping, tripping and stumbling without subsequent striking against object, initial encounter: Secondary | ICD-10-CM | POA: Insufficient documentation

## 2017-12-22 MED ORDER — IBUPROFEN 400 MG PO TABS
600.0000 mg | ORAL_TABLET | Freq: Once | ORAL | Status: AC
  Administered 2017-12-22: 600 mg via ORAL
  Filled 2017-12-22: qty 1

## 2017-12-22 NOTE — ED Triage Notes (Signed)
Patient states that she woke up this am and hit her face on the tile floor at home. The patient has noted hematoma to her left forehead, and laceration to the bridge of her nose. Patient has noted aphagia that she and her family member states are WNL for the patient

## 2017-12-22 NOTE — Discharge Instructions (Signed)
Ct scan did not show any signs of fractures or bleeding. Keep the antibiotic ointment applied to your nose. Watch for signs of infection. Motrin and tylenol for pain and swelling. Keep ice on the forehead.  Up with your primary care doctor and neurologist.  Return to the ED with any worsening symptoms  You have been diagnosed with a possible concussion. Concussion Hotline [phone]). Ibuprofen or Tylenol for pain Rest, ice on head.  Stay in a quiet, non-simulating, dark environment. No TV, computer use, video games until headache is resolved completely. . Follow up with your primary care physician if headache persists.  Return to the emergency department if patient becomes lethargic, begins vomiting or other change in mental status.  HEAD INJURY If any of the following occur notify your physician or go to the Hospital Emergency Department:  Increased drowsiness, stupor or loss of consciousness  Temperature above 100 F  Vomiting  Severe headache  Blood or clear fluid dripping from the nose or ears  Stiffness of the neck  Dizziness or blurred vision  Any other unusual symptoms  PRECAUTIONS  Keep head elevated at all times for the first 24 hours (Elevate mattress if pillow is ineffective)  Do not take sedatives, narcotics or alcohol  Avoid aspirin. Use only acetaminophen (e.g. Tylenol) or ibuprofen (e.g. Advil) for relief of pain. Follow directions on the bottle for dosage.  Use ice packs for comfort

## 2017-12-22 NOTE — ED Notes (Signed)
Patient transported to CT 

## 2017-12-22 NOTE — ED Provider Notes (Signed)
[facility] EMERGENCY DEPARTMENT Provider Note   CSN: [id number] Arrival date & time: 12/22/17  1036     History   Chief Complaint Chief Complaint  Patient presents with  . Fall    HPI Sherry Zimmerman is a 62 y.o. female.  HPI 62 year old Caucasian female past medical history significant for cerebellar ataxia and depression presents to the emergency department today for evaluation after mechanical fall.  Patient states that she got out of bed this morning and stepped too quickly losing her balance falling face forward onto the ground.  Patient denies LOC.  She reports an abrasion to the bridge of her nose.  She also reports some swelling to her left forehead.  Patient denies any vision changes, headache, lightheadedness or dizziness.  Patient denies any associated neck pain, back pain, chest pain, shortness of breath, abdominal pain, extremity paresthesias.  Patient has not taking for the pain prior to arrival.  Nothing makes better or worse.  Patient does have cerebellar ataxia at baseline with aphasia and ataxia which is normal for patient.  Patient believes that her Tdap is up-to-date.  Pt denies any fever, chill, ha, vision changes, lightheadedness, dizziness, congestion, neck pain, cp, sob, cough, abd pain, n/v/d, urinary symptoms, change in bowel habits, melena, hematochezia, lower extremity paresthesias.  Past Medical History:  Diagnosis Date  . Cerebellar ataxia ([place]) 2010  . Depression   . Splenic rupture 10/26/14    Patient Active Problem List   Diagnosis Date Noted  . Fall at home 10/30/2014  . Cerebellar ataxia ([place]) 10/30/2014  . 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    Past Surgical History:  Procedure Laterality Date  . ABDOMINAL HYSTERECTOMY    . SPLENECTOMY, TOTAL N/A 10/26/2014   Procedure: SPLENECTOMY;  Surgeon: Georganna Skeans, MD;  Location: [facility];  Service: General;  Laterality: N/A;  . WRIST FRACTURE SURGERY Right      OB History    No data available       Home Medications    Prior to Admission medications   Medication Sig Start Date End Date Taking? Authorizing Provider  acetaminophen (TYLENOL) 325 MG tablet Take 2-3 tablets (650-975 mg total) by mouth every 6 (six) hours as needed for mild pain, moderate pain or headache. 10/30/14   Nat Christen, PA-C  Calcium Carbonate (CALCIUM 500 PO) Take 1 tablet by mouth daily.     [provider]  docusate sodium 100 MG CAPS Take 100 mg by mouth 2 (two) times daily. 10/30/14   Nat Christen, PA-C  lactose free nutrition (BOOST PLUS) LIQD Take 237 mLs by mouth 3 (three) times daily with meals. 10/30/14   Nat Christen, PA-C  methocarbamol (ROBAXIN) 750 MG tablet Take 1 tablet (750 mg total) by mouth 3 (three) times daily. 10/30/14   Nat Christen, PA-C  oxyCODONE (OXY IR/ROXICODONE) 5 MG immediate release tablet Take 1-2 tablets (5-10 mg total) by mouth every 4 (four) hours as needed for moderate pain, severe pain or breakthrough pain. 10/31/14   Jackolyn Confer, MD  oxyCODONE-acetaminophen (PERCOCET/ROXICET) 5-325 MG per tablet Take 1-2 tablets by mouth every 6 (six) hours as needed for moderate pain. 10/30/14   Nat Christen, PA-C  Venlafaxine HCl (EFFEXOR PO) Take by mouth.      [provider]  venlafaxine XR (EFFEXOR-XR) 150 MG 24 hr capsule Take 150 mg by mouth at bedtime. 10/20/14   [provider]  VITAMIN D, CHOLECALCIFEROL, PO Take 1 tablet  by mouth daily.     [provider]    Family History History reviewed. No pertinent family history.  Social History Social History   Tobacco Use  . Smoking status: Current Every Day Smoker  . Smokeless tobacco: Never Used  Substance Use Topics  . Alcohol use: Yes    Comment: socially  . Drug use: No     Allergies   Morphine and related and Sulfa antibiotics   Review of Systems Review of Systems  Constitutional: Negative for chills and fever.  HENT:  Negative for congestion.   Eyes: Negative for visual disturbance.  Respiratory: Negative for shortness of breath.   Cardiovascular: Negative for chest pain.  Gastrointestinal: Negative for abdominal pain, nausea and vomiting.  Genitourinary: Negative for dysuria, flank pain, hematuria and urgency.  Musculoskeletal: Negative for arthralgias, back pain, gait problem, joint swelling, myalgias, neck pain and neck stiffness.  Skin: Positive for color change and wound.  Neurological: Negative for dizziness, facial asymmetry, weakness, light-headedness, numbness and headaches.     Physical Exam Updated Vital Signs BP 131/80 (BP Location: Right Arm)   Pulse 74   Resp 18   Ht 5\' 9"  (1.753 m)   Wt 56.7 kg (125 lb)   SpO2 94%   BMI 18.46 kg/m   Physical Exam Physical Exam  Constitutional: Pt is oriented to person, place, and time. Appears well-developed and well-nourished. No distress.  HENT:  Head: Normocephalic.  Patient has hematoma to the left forehead without any open wounds.  No raccoon eyes or battle sign. Ears: No bilateral hemotympanum. Nose: Nose normal. No septal hematoma.  Patient has abrasion to the bridge of the nose with bleeding controlled.  No gaping wound. Mouth/Throat: Uvula is midline, oropharynx is clear and moist and mucous membranes are normal.  Eyes: Conjunctivae and EOM are normal. Pupils are equal, round, and reactive to light.  Neck: No spinous process tenderness and no muscular tenderness present. No rigidity. Normal range of motion present.  Full ROM without pain No midline cervical tenderness No crepitus, deformity or step-offs  No paraspinal tenderness  Cardiovascular: Normal rate, regular rhythm and intact distal pulses.   Pulses:      Radial pulses are 2+ on the right side, and 2+ on the left side.       Dorsalis pedis pulses are 2+ on the right side, and 2+ on the left side.       Posterior tibial pulses are 2+ on the right side, and 2+ on the left  side.  Pulmonary/Chest: Effort normal and breath sounds normal. No accessory muscle usage. No respiratory distress. No decreased breath sounds. No wheezes. No rhonchi. No rales. Exhibits no tenderness and no bony tenderness.  No flail segment, crepitus or deformity Equal chest expansion  Abdominal: Soft. Normal appearance and bowel sounds are normal. There is no tenderness. There is no rigidity, no guarding and no CVA tenderness.  Abd soft and nontender  Musculoskeletal: Normal range of motion.       Thoracic back: Exhibits normal range of motion.       Lumbar back: Exhibits normal range of motion.  Full range of motion of the T-spine and L-spine No tenderness to palpation of the spinous processes of the T-spine or L-spine No crepitus, deformity or step-offs No tenderness to palpation of the paraspinous muscles of the L-spine  Lymphadenopathy:    Pt has no cervical adenopathy.  Neurological: Pt is alert and oriented to person, place, and time. Normal reflexes. No  cranial nerve deficit. GCS eye subscore is 4. GCS verbal subscore is 5. GCS motor subscore is 6.  Reflex Scores:      Bicep reflexes are 2+ on the right side and 2+ on the left side.      Brachioradialis reflexes are 2+ on the right side and 2+ on the left side.      Patellar reflexes are 2+ on the right side and 2+ on the left side.      Achilles reflexes are 2+ on the right side and 2+ on the left side. Speech is and goal oriented, follows commands.  Patient's speech slurred which is baseline per patient and husband due to her cerebellar ataxia. Normal 5/5 strength in upper and lower extremities bilaterally including dorsiflexion and plantar flexion, strong and equal grip strength Sensation normal to light and sharp touch Moves extremities coordination intact  ataxia noted on ambulation which is baseline per patient. No Clonus  Skin: Skin is warm and dry. No rash noted. Pt is not diaphoretic. No erythema.  Psychiatric: Normal  mood and affect.  Nursing note and vitals reviewed.       ED Treatments / Results  Labs (all labs ordered are listed, but only abnormal results are displayed) Labs Reviewed - No data to display  EKG  EKG Interpretation None       Radiology Ct Head Wo Contrast  Result Date: 12/22/2017 CLINICAL DATA:  62 year old female hit face on tile floor. Cerebellar ataxia. Initial encounter. EXAM: CT HEAD WITHOUT CONTRAST CT MAXILLOFACIAL WITHOUT CONTRAST CT CERVICAL SPINE WITHOUT CONTRAST TECHNIQUE: Multidetector CT imaging of the head, cervical spine, and maxillofacial structures were performed using the standard protocol without intravenous contrast. Multiplanar CT image reconstructions of the cervical spine and maxillofacial structures were also generated. COMPARISON:  03/15/2008 head CT. FINDINGS: CT HEAD FINDINGS Brain: No intracranial hemorrhage or CT evidence of large acute infarct. Global atrophy most notable involving cerebellum. No intracranial mass lesion noted on this unenhanced exam. Vascular: Vascular calcifications Skull: No skull fracture Other: Left frontal subcutaneous hematoma. CT MAXILLOFACIAL FINDINGS Osseous: No facial fracture or orbital fracture. Orbits: Orbital structures appear to be grossly intact. Sinuses: Clear sinuses. Soft tissues: Hematoma left forehead. CT CERVICAL SPINE FINDINGS Alignment: Mild curvature Skull base and vertebrae: No cervical spine fracture. Soft tissues and spinal canal: No abnormal prevertebral soft tissue swelling. Disc levels: Multilevel mild cervical spondylotic changes most notable on the left at the C5-6 level. Upper chest: No worrisome mass. Other: No worrisome abnormality. IMPRESSION: Hematoma left forehead/frontal region without underlying skull fracture or intracranial hemorrhage. No facial fracture. No cervical spine fracture. Age advanced global atrophy most notable involving the cerebellum. This may be related to malnutrition, alcohol abuse,  seizures, treatment of seizures or neurodegenerative disorder. Cervical spondylotic changes most prominent on the left at the C5-6 level. Electronically Signed   By: Genia Del M.D.   On: 12/22/2017 12:30   Ct Cervical Spine Wo Contrast  Result Date: 12/22/2017 CLINICAL DATA:  62 year old female hit face on tile floor. Cerebellar ataxia. Initial encounter. EXAM: CT HEAD WITHOUT CONTRAST CT MAXILLOFACIAL WITHOUT CONTRAST CT CERVICAL SPINE WITHOUT CONTRAST TECHNIQUE: Multidetector CT imaging of the head, cervical spine, and maxillofacial structures were performed using the standard protocol without intravenous contrast. Multiplanar CT image reconstructions of the cervical spine and maxillofacial structures were also generated. COMPARISON:  03/15/2008 head CT. FINDINGS: CT HEAD FINDINGS Brain: No intracranial hemorrhage or CT evidence of large acute infarct. Global atrophy most notable involving cerebellum. No  intracranial mass lesion noted on this unenhanced exam. Vascular: Vascular calcifications Skull: No skull fracture Other: Left frontal subcutaneous hematoma. CT MAXILLOFACIAL FINDINGS Osseous: No facial fracture or orbital fracture. Orbits: Orbital structures appear to be grossly intact. Sinuses: Clear sinuses. Soft tissues: Hematoma left forehead. CT CERVICAL SPINE FINDINGS Alignment: Mild curvature Skull base and vertebrae: No cervical spine fracture. Soft tissues and spinal canal: No abnormal prevertebral soft tissue swelling. Disc levels: Multilevel mild cervical spondylotic changes most notable on the left at the C5-6 level. Upper chest: No worrisome mass. Other: No worrisome abnormality. IMPRESSION: Hematoma left forehead/frontal region without underlying skull fracture or intracranial hemorrhage. No facial fracture. No cervical spine fracture. Age advanced global atrophy most notable involving the cerebellum. This may be related to malnutrition, alcohol abuse, seizures, treatment of seizures or  neurodegenerative disorder. Cervical spondylotic changes most prominent on the left at the C5-6 level. Electronically Signed   By: Genia Del M.D.   On: 12/22/2017 12:30   Ct Maxillofacial Wo Cm  Result Date: 12/22/2017 CLINICAL DATA:  62 year old female hit face on tile floor. Cerebellar ataxia. Initial encounter. EXAM: CT HEAD WITHOUT CONTRAST CT MAXILLOFACIAL WITHOUT CONTRAST CT CERVICAL SPINE WITHOUT CONTRAST TECHNIQUE: Multidetector CT imaging of the head, cervical spine, and maxillofacial structures were performed using the standard protocol without intravenous contrast. Multiplanar CT image reconstructions of the cervical spine and maxillofacial structures were also generated. COMPARISON:  03/15/2008 head CT. FINDINGS: CT HEAD FINDINGS Brain: No intracranial hemorrhage or CT evidence of large acute infarct. Global atrophy most notable involving cerebellum. No intracranial mass lesion noted on this unenhanced exam. Vascular: Vascular calcifications Skull: No skull fracture Other: Left frontal subcutaneous hematoma. CT MAXILLOFACIAL FINDINGS Osseous: No facial fracture or orbital fracture. Orbits: Orbital structures appear to be grossly intact. Sinuses: Clear sinuses. Soft tissues: Hematoma left forehead. CT CERVICAL SPINE FINDINGS Alignment: Mild curvature Skull base and vertebrae: No cervical spine fracture. Soft tissues and spinal canal: No abnormal prevertebral soft tissue swelling. Disc levels: Multilevel mild cervical spondylotic changes most notable on the left at the C5-6 level. Upper chest: No worrisome mass. Other: No worrisome abnormality. IMPRESSION: Hematoma left forehead/frontal region without underlying skull fracture or intracranial hemorrhage. No facial fracture. No cervical spine fracture. Age advanced global atrophy most notable involving the cerebellum. This may be related to malnutrition, alcohol abuse, seizures, treatment of seizures or neurodegenerative disorder. Cervical  spondylotic changes most prominent on the left at the C5-6 level. Electronically Signed   By: Genia Del M.D.   On: 12/22/2017 12:30    Procedures Procedures (including critical care time)  Medications Ordered in ED Medications - No data to display   Initial Impression / Assessment and Plan / ED Course  I have reviewed the triage vital signs and the nursing notes.  Pertinent labs & imaging results that were available during my care of the patient were reviewed by me and considered in my medical decision making (see chart for details).     Patient presents to the ED for evaluation of forehead and abrasion to the bridge of the nose.  Patient reports a history of cerebellar ataxia that is followed by neurology.  She reports history of unstable gait with falls in the past.  Patient is overall well-appearing and nontoxic.  Vital signs are reassuring.  Patient denies any LOC.  On exam patient has no new focal neuro findings.  She was neurovascularly intact in all extremities.  No signs of intrathoracic, intra-abdominal trauma.  Patient does have some  aphasia and ataxia which family states is baseline for patient.  They state that she is at her baseline and patient agrees.  Patient does have a abrasion to the bridge of her nose.  This is not suitable to repair.  Imaging was obtained of head, face and cervical spine which shows no acute abnormalities except for left frontal hematoma.  Patient does report concussion-like symptoms.  I have instructed patient to follow-up with the concussion clinic and her neurologist.  Again patient has no new focal neuro findings that would be concerning for further workup at this time.  She is tetanus shot is up-to-date.  Vital signs remained reassuring.  Instructed outpatient follow-up.  Have given concussion education and very strict return precautions  Pt is hemodynamically stable, in NAD, & able to ambulate in the ED. Evaluation does not show pathology that  would require ongoing emergent intervention or inpatient treatment. I explained the diagnosis to the patient. Pain has been managed & has no complaints prior to dc. Pt is comfortable with above plan and is stable for discharge at this time. All questions were answered prior to disposition. Strict return precautions for f/u to the ED were discussed. Encouraged follow up with PCP.   Final Clinical Impressions(s) / ED Diagnoses   Final diagnoses:  Fall, initial encounter  Concussion without loss of consciousness, initial encounter  Abrasion    ED Discharge Orders    None       Aaron Edelman 12/22/17 1729    Little, Wenda Overland, MD 12/23/17 1555

## 2018-01-06 ENCOUNTER — Encounter: Payer: Self-pay | Admitting: Emergency Medicine

## 2018-01-08 ENCOUNTER — Encounter: Payer: Self-pay | Admitting: Family Medicine

## 2018-01-08 ENCOUNTER — Ambulatory Visit (INDEPENDENT_AMBULATORY_CARE_PROVIDER_SITE_OTHER): Payer: Medicare Other | Admitting: Family Medicine

## 2018-01-08 VITALS — BP 128/80 | HR 84 | Temp 98.6°F | Ht 67.0 in | Wt 124.2 lb

## 2018-01-08 DIAGNOSIS — F17210 Nicotine dependence, cigarettes, uncomplicated: Secondary | ICD-10-CM | POA: Diagnosis not present

## 2018-01-08 DIAGNOSIS — D509 Iron deficiency anemia, unspecified: Secondary | ICD-10-CM | POA: Insufficient documentation

## 2018-01-08 DIAGNOSIS — Z9081 Acquired absence of spleen: Secondary | ICD-10-CM | POA: Diagnosis not present

## 2018-01-08 DIAGNOSIS — F329 Major depressive disorder, single episode, unspecified: Secondary | ICD-10-CM

## 2018-01-08 DIAGNOSIS — G112 Late-onset cerebellar ataxia: Secondary | ICD-10-CM

## 2018-01-08 DIAGNOSIS — J309 Allergic rhinitis, unspecified: Secondary | ICD-10-CM | POA: Insufficient documentation

## 2018-01-08 MED ORDER — VARENICLINE TARTRATE 0.5 MG PO TABS: 0.5000 mg | ORAL_TABLET | Freq: Two times a day (BID) | ORAL | 0 refills | Status: DC

## 2018-01-08 MED ORDER — VENLAFAXINE HCL ER 75 MG PO CP24: 75.0000 mg | ORAL_CAPSULE | Freq: Every day | ORAL | 3 refills | Status: DC

## 2018-01-08 MED ORDER — VENLAFAXINE HCL ER 150 MG PO CP24: 150.0000 mg | ORAL_CAPSULE | Freq: Every day | ORAL | 3 refills | Status: DC

## 2018-01-08 MED ORDER — VARENICLINE TARTRATE 1 MG PO TABS: 1.0000 mg | ORAL_TABLET | Freq: Two times a day (BID) | ORAL | 2 refills | Status: DC

## 2018-01-08 NOTE — Progress Notes (Signed)
Subjective  CC:  Chief Complaint  Patient presents with  . Establish Care    HPI: Sherry Zimmerman is a 62 y.o. female who presents to [facility] [place] at [facility] today to establish care with me as a new patient. She is a former [facility] patient and is here to reestablish care with me today.   She has the following concerns or needs:   Last cpe June 2018. Reviewed notes and labs.   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 No changes in management. Had a fall in January. Reviewed ER records. Uses her walker in the home now. Discussed safety measures at home  Smoking: would be interested in getting back on chantix to try to quit again. Has used successfully in the past . No AEs. Is on effexor for depression which is improved.   Chronic microcytic anemia dating back to 2015 but nothing prior. Says she has always been anemic but no dx documented. Energy levels are fine. crc screen is up to date and negative. No melena. Low iron diet.    We updated and reviewed the patient's past history in detail and it is documented below.  Patient Active Problem List   Diagnosis Date Noted  . AR (allergic rhinitis) 01/08/2018  . Microcytic anemia 01/08/2018  . OAB (overactive bladder) 11/09/2014  . 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 Tobacco dependence due to cigarettes 02/22/2014  . Migraine 08/15/2013   Health Maintenance  Topic Date Due  . HIV Screening  01/01/1970  . PAP SMEAR  01/02/1976  . MAMMOGRAM  05/29/2018  . TETANUS/TDAP  12/08/2018  . COLONOSCOPY  03/09/2023  . INFLUENZA VACCINE  Completed  . Hepatitis C Screening  Completed   Immunization History  Administered Date(s) Administered  . DTaP / HiB 11/09/2014  . Influenza Split 11/19/2015  . Influenza, Seasonal, Injecte, Preservative Fre 10/20/2014  . Influenza,inj,Quad PF,6+ Mos 10/10/2016  . Meningococcal Conjugate 11/09/2014, 01/05/2015  .  Pneumococcal Conjugate-13 11/09/2014  . Pneumococcal Polysaccharide-23 05/02/2015  . Tdap 12/08/2008   Current Meds  Medication Sig  . Calcium Carbonate-Vitamin D 600-200 MG-UNIT TABS Take by mouth.  . lactose free nutrition (BOOST PLUS) LIQD Take 237 mLs by mouth 3 (three) times daily with meals.  Marland Kitchen oxybutynin (DITROPAN) 5 MG tablet Take 5 mg by mouth.  . venlafaxine XR (EFFEXOR-XR) 150 MG 24 hr capsule Take 1 capsule (150 mg total) by mouth at bedtime. Take with 75mg  cap for 225mg  total daily dose  . VITAMIN D, CHOLECALCIFEROL, PO Take 1 tablet by mouth daily.   . [DISCONTINUED] Venlafaxine HCl (EFFEXOR PO) Take by mouth.    . [DISCONTINUED] venlafaxine XR (EFFEXOR-XR) 150 MG 24 hr capsule Take 150 mg by mouth at bedtime.    Allergies: Patient is allergic to morphine and related; sulfa antibiotics; and sulfacetamide sodium. 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 Family History: Patient family history includes Ataxia in her mother; Cancer in her father; Diabetes in her mother; Heart disease in her mother; Stroke in her mother. Social History:  Patient  reports that she has been smoking.  she has never used smokeless tobacco. She reports that she drinks alcohol. She reports that she does not use drugs.  Review of Systems: Constitutional: negative for fever or malaise Ophthalmic: negative for photophobia, double vision or loss of vision Cardiovascular: negative for chest pain, dyspnea on exertion, or new LE swelling Respiratory: negative for SOB or persistent cough Gastrointestinal: negative for abdominal pain, change in bowel habits or melena Genitourinary: negative for dysuria or gross  hematuria Musculoskeletal: negative for new gait disturbance or muscular weakness Integumentary: negative for new or persistent rashes Neurological: negative for TIA or stroke symptoms Psychiatric: negative for SI or delusions Allergic/Immunologic: negative for hives  Patient Care Team    Relationship Specialty Notifications Start End  Leamon Arnt, MD PCP - General Family Medicine  01/08/18     Objective  Vitals: BP 128/80   Pulse 84   Temp 98.6 F (37 C)   Ht 5\' 7"  (1.702 m)   Wt 124 lb 3.2 oz (56.3 kg)   BMI 19.45 kg/m  General:  Well developed, well nourished, no acute distress  Psych:  Alert and oriented,normal mood and affect, dysarthric  HEENT:  Normocephalic, atraumatic, non-icteric sclera, nl conjunctiva, PERRL, oropharynx is without mass or exudate, supple neck without adenopathy, mass or thyromegaly Cardiovascular:  RRR without gallop, rub or murmur, nondisplaced PMI Respiratory:  Good breath sounds bilaterally, CTAB with normal respiratory effort Skin:  Warm, no rashes or suspicious lesions noted, pale nail beds Neurologic:    Mental status is normal. Gross motor and sensory exams are normal. Normal gait  Assessment  1. Familial cerebellar ataxia ([place])   2. S/P splenectomy   3. Tobacco dependence due to cigarettes   4. Microcytic anemia      Plan   Ataxia - discussed safety and fall prevention.   Smoking cessation discussed. Start chantix. Appropriate counseling done. Discussed close monitoring of mental status and personality while on chantix, especially since on effexor with h/o depression. Pt lives with partner who can monitor as well; never had problems on it before.   Discussed chronic anemia; suspect thalasemia or chronic diagnosis. Will order labs with her cpe visit labs in June. Asymptomatic.  Depression - improved. Continue effexor. Refilled.   Follow up:  June for medicare cpe with labs. awv afterwards.   Commons side effects, risks, benefits,  and alternatives for medications and treatment plan prescribed today were discussed, and the patient expressed understanding of the given instructions. Patient is instructed to call or message via MyChart if he/she has any questions or concerns regarding our treatment plan. No barriers to understanding were identified. We discussed Red Flag symptoms and signs in detail. Patient expressed understanding regarding what to do in case of urgent or emergency type symptoms.   Medication list was reconciled, printed and provided to the patient in AVS. Patient instructions and summary information was reviewed with the patient as documented in the AVS. This note was prepared with assistance of Dragon voice recognition software. Occasional wrong-word or sound-a-like substitutions may have occurred due to the inherent limitations of voice recognition software  Orders Placed This Encounter  Procedures  . HM MAMMOGRAPHY  . HM HEPATITIS C SCREENING LAB   Meds ordered this encounter  Medications  . varenicline (CHANTIX) 0.5 MG tablet    Sig: Take 1 tablet (0.5 mg total) by mouth 2 (two) times daily.    Dispense:  60 tablet    Refill:  0  . varenicline (CHANTIX CONTINUING MONTH PAK) 1 MG tablet    Sig: Take 1 tablet (1 mg total) by mouth 2 (two) times daily.    Dispense:  60 tablet    Refill:  2  . venlafaxine XR (EFFEXOR-XR) 150 MG 24 hr capsule    Sig: Take 1 capsule (150 mg total) by mouth at bedtime. Take with 75mg  cap for 225mg  total daily dose    Dispense:  180 capsule    Refill:  3  . venlafaxine XR (EFFEXOR-XR) 75 MG 24 hr capsule    Sig: Take 1 capsule (75 mg total) by mouth daily. Take with 150mg  XR cap for 225mg  total daily dose    Dispense:  90 capsule    Refill:  3

## 2018-01-08 NOTE — Patient Instructions (Addendum)
It was so good seeing you again! Thank you for establishing with my new practice and allowing me to continue caring for you. It means a lot to me.   Please schedule a follow up appointment with me in June for your annual physical (30 minute OV) with labs.   Steps to Quit Smoking Smoking tobacco can be bad for your health. It can also affect almost every organ in your body. Smoking puts you and people around you at risk for many serious long-lasting (chronic) diseases. Quitting smoking is hard, but it is one of the bes t things that you can do for your health. It is never too late to quit. What are the benefits of quitting smoking? When you quit smoking, you lower your risk for getting serious diseases and conditions. They can include:  Lung cancer or lung disease.  Heart disease.  Stroke.  Heart attack.  Not being able to have children (infertility).  Weak bones (osteoporosis) and broken bones (fractures).  If you have coughing, wheezing, and shortness of breath, those symptoms may get better when you quit. You may also get sick less often. If you are pregnant, quitting smoking can help to lower your chances of having a baby of low birth weight. What can I do to help me quit smoking? Talk with your doctor about what can help you quit smoking. Some things you can do (strategies) include:  Quitting smoking totally, instead of slowly cutting back how much you smoke over a period of time.  Going to in-person counseling. You are more likely to quit if you go to many counseling sessions.  Using resources and support systems, such as: ? Database administrator with a Social worker. ? Phone quitlines. ? Careers information officer. ? Support groups or group counseling. ? Text messaging programs. ? Mobile phone apps or applications.  Taking medicines. Some of these medicines may have nicotine in them. If you are pregnant or breastfeeding, do not take any medicines to quit smoking unless your doctor  says it is okay. Talk with your doctor about counseling or other things that can help you.  Talk with your doctor about using more than one strategy at the same time, such as taking medicines while you are also going to in-person counseling. This can help make quitting easier. What things can I do to make it easier to quit? Quitting smoking might feel very hard at first, but there is a lot that you can do to make it easier. Take these steps:  Talk to your family and friends. Ask them to support and encourage you.  Call phone quitlines, reach out to support groups, or work with a Social worker.  Ask people who smoke to not smoke around you.  Avoid places that make you want (trigger) to smoke, such as: ? Bars. ? Parties. ? Smoke-break areas at work.  Spend time with people who do not smoke.  Lower the stress in your life. Stress can make you want to smoke. Try these things to help your stress: ? Getting regular exercise. ? Deep-breathing exercises. ? Yoga. ? Meditating. ? Doing a body scan. To do this, close your eyes, focus on one area of your body at a time from head to toe, and notice which parts of your body are tense. Try to relax the muscles in those areas.  Download or buy apps on your mobile phone or tablet that can help you stick to your quit plan. There are many free apps, such as QuitGuide  from the CDC [facility] for Disease Control and Prevention). You can find more support from smokefree.gov and other websites.  This information is not intended to replace advice given to you by your health care provider. Make sure you discuss any questions you have with your health care provider. Document Released: 09/20/2009 Document Revised: 07/22/2016 Document Reviewed: 04/10/2015 Elsevier Interactive Patient Education  2018 [place] with Quitting Smoking Quitting smoking is a physical and mental challenge. You will face cravings, withdrawal symptoms, and temptation. Before  quitting, work with your health care provider to make a plan that can help you cope. Preparation can help you quit and keep you from giving in. How can I cope with cravings? Cravings usually last for 5-10 minutes. If you get through it, the craving will pass. Consider taking the following actions to help you cope with cravings:  Keep your mouth busy: ? Chew sugar-free gum. ? Suck on hard candies or a straw. ? Brush your teeth.  Keep your hands and body busy: ? Immediately change to a different activity when you feel a craving. ? Squeeze or play with a ball. ? Do an activity or a hobby, like making bead jewelry, practicing needlepoint, or working with wood. ? Mix up your normal routine. ? Take a short exercise break. Go for a quick walk or run up and down stairs. ? Spend time in public places where smoking is not allowed.  Focus on doing something kind or helpful for someone else.  Call a friend or family member to talk during a craving.  Join a support group.  Call a quit line, such as 1-800-QUIT-NOW.  Talk with your health care provider about medicines that might help you cope with cravings and make quitting easier for you.  How can I deal with withdrawal symptoms? Your body may experience negative effects as it tries to get used to not having nicotine in the system. These effects are called withdrawal symptoms. They may include:  Feeling hungrier than normal.  Trouble concentrating.  Irritability.  Trouble sleeping.  Feeling depressed.  Restlessness and agitation.  Craving a cigarette.  To manage withdrawal symptoms:  Avoid places, people, and activities that trigger your cravings.  Remember why you want to quit.  Get plenty of sleep.  Avoid coffee and other caffeinated drinks. These may worsen some of your symptoms.  How can I handle social situations? Social situations can be difficult when you are quitting smoking, especially in the first few weeks. To  manage this, you can:  Avoid parties, bars, and other social situations where people might be smoking.  Avoid alcohol.  Leave right away if you have the urge to smoke.  Explain to your family and friends that you are quitting smoking. Ask for understanding and support.  Plan activities with friends or family where smoking is not an option.  What are some ways I can cope with stress? Wanting to smoke may cause stress, and stress can make you want to smoke. Find ways to manage your stress. Relaxation techniques can help. For example:  Breathe slowly and deeply, in through your nose and out through your mouth.  Listen to soothing, relaxing music.  Talk with a family member or friend about your stress.  Light a candle.  Soak in a bath or take a shower.  Think about a peaceful place.  What are some ways I can prevent weight gain? Be aware that many people gain weight after they quit smoking. However, not  everyone does. To keep from gaining weight, have a plan in place before you quit and stick to the plan after you quit. Your plan should include:  Having healthy snacks. When you have a craving, it may help to: ? Eat plain popcorn, crunchy carrots, celery, or other cut vegetables. ? Chew sugar-free gum.  Changing how you eat: ? Eat small portion sizes at meals. ? Eat 4-6 small meals throughout the day instead of 1-2 large meals a day. ? Be mindful when you eat. Do not watch television or do other things that might distract you as you eat.  Exercising regularly: ? Make time to exercise each day. If you do not have time for a long workout, do short bouts of exercise for 5-10 minutes several times a day. ? Do some form of strengthening exercise, like weight lifting, and some form of aerobic exercise, like running or swimming.  Drinking plenty of water or other low-calorie or no-calorie drinks. Drink 6-8 glasses of water daily, or as much as instructed by your health care  provider.  Summary  Quitting smoking is a physical and mental challenge. You will face cravings, withdrawal symptoms, and temptation to smoke again. Preparation can help you as you go through these challenges.  You can cope with cravings by keeping your mouth busy (such as by chewing gum), keeping your body and hands busy, and making calls to family, friends, or a helpline for people who want to quit smoking.  You can cope with withdrawal symptoms by avoiding places where people smoke, avoiding drinks with caffeine, and getting plenty of rest.  Ask your health care provider about the different ways to prevent weight gain, avoid stress, and handle social situations. This information is not intended to replace advice given to you by your health care provider. Make sure you discuss any questions you have with your health care provider. Document Released: 11/21/2016 Document Revised: 11/21/2016 Document Reviewed: 11/21/2016 Elsevier Interactive Patient Education  [employer].

## 2018-05-13 ENCOUNTER — Encounter: Payer: Self-pay | Admitting: Emergency Medicine

## 2018-05-13 ENCOUNTER — Encounter: Payer: Self-pay | Admitting: Family Medicine

## 2018-05-13 ENCOUNTER — Ambulatory Visit (INDEPENDENT_AMBULATORY_CARE_PROVIDER_SITE_OTHER): Payer: Medicare Other | Admitting: Family Medicine

## 2018-05-13 ENCOUNTER — Other Ambulatory Visit: Payer: Self-pay

## 2018-05-13 VITALS — BP 128/80 | HR 93 | Temp 97.9°F | Ht 67.0 in | Wt 124.4 lb

## 2018-05-13 DIAGNOSIS — G112 Late-onset cerebellar ataxia: Secondary | ICD-10-CM

## 2018-05-13 DIAGNOSIS — F17210 Nicotine dependence, cigarettes, uncomplicated: Secondary | ICD-10-CM | POA: Diagnosis not present

## 2018-05-13 DIAGNOSIS — R062 Wheezing: Secondary | ICD-10-CM

## 2018-05-13 DIAGNOSIS — D509 Iron deficiency anemia, unspecified: Secondary | ICD-10-CM | POA: Diagnosis not present

## 2018-05-13 DIAGNOSIS — L603 Nail dystrophy: Secondary | ICD-10-CM | POA: Diagnosis not present

## 2018-05-13 DIAGNOSIS — D508 Other iron deficiency anemias: Secondary | ICD-10-CM | POA: Diagnosis not present

## 2018-05-13 DIAGNOSIS — Z1231 Encounter for screening mammogram for malignant neoplasm of breast: Secondary | ICD-10-CM

## 2018-05-13 DIAGNOSIS — Z1239 Encounter for other screening for malignant neoplasm of breast: Secondary | ICD-10-CM

## 2018-05-13 DIAGNOSIS — I7300 Raynaud's syndrome without gangrene: Secondary | ICD-10-CM | POA: Diagnosis not present

## 2018-05-13 DIAGNOSIS — J439 Emphysema, unspecified: Secondary | ICD-10-CM | POA: Insufficient documentation

## 2018-05-13 DIAGNOSIS — J449 Chronic obstructive pulmonary disease, unspecified: Secondary | ICD-10-CM

## 2018-05-13 HISTORY — DX: Raynaud's syndrome without gangrene: I73.00

## 2018-05-13 LAB — LIPID PANEL
Cholesterol: 206 mg/dL — ABNORMAL HIGH (ref 0–200)
HDL: 60.5 mg/dL (ref >39.00)
LDL Cholesterol: 115 mg/dL — ABNORMAL HIGH (ref 0–99)
NonHDL: 145.64
Total CHOL/HDL Ratio: 3
Triglycerides: 155 mg/dL — ABNORMAL HIGH (ref 0.0–149.0)
VLDL: 31 mg/dL (ref 0.0–40.0)

## 2018-05-13 LAB — CBC WITH DIFFERENTIAL/PLATELET
Basophils Absolute: 0.2 10*3/uL — ABNORMAL HIGH (ref 0.0–0.1)
Basophils Relative: 1.6 % (ref 0.0–3.0)
Eosinophils Absolute: 0.3 10*3/uL (ref 0.0–0.7)
Eosinophils Relative: 3.4 % (ref 0.0–5.0)
HCT: 37 % (ref 36.0–46.0)
Hemoglobin: 11.1 g/dL — ABNORMAL LOW (ref 12.0–15.0)
Lymphocytes Relative: 26 % (ref 12.0–46.0)
Lymphs Abs: 2.6 10*3/uL (ref 0.7–4.0)
MCHC: 30 g/dL (ref 30.0–36.0)
MCV: 79.5 fl (ref 78.0–100.0)
Monocytes Absolute: 1.1 10*3/uL — ABNORMAL HIGH (ref 0.1–1.0)
Monocytes Relative: 10.7 % (ref 3.0–12.0)
Neutro Abs: 5.7 10*3/uL (ref 1.4–7.7)
Neutrophils Relative %: 58.3 % (ref 43.0–77.0)
Platelets: 615 10*3/uL — ABNORMAL HIGH (ref 150.0–400.0)
RBC: 4.65 Mil/uL (ref 3.87–5.11)
RDW: 17.3 % — ABNORMAL HIGH (ref 11.5–15.5)
WBC: 9.8 10*3/uL (ref 4.0–10.5)

## 2018-05-13 LAB — COMPREHENSIVE METABOLIC PANEL
ALT: 11 U/L (ref 0–35)
AST: 14 U/L (ref 0–37)
Albumin: 4.1 g/dL (ref 3.5–5.2)
Alkaline Phosphatase: 105 U/L (ref 39–117)
BUN: 19 mg/dL (ref 6–23)
CO2: 30 mEq/L (ref 19–32)
Calcium: 10 mg/dL (ref 8.4–10.5)
Chloride: 103 mEq/L (ref 96–112)
Creatinine, Ser: 0.76 mg/dL (ref 0.40–1.20)
GFR: 81.6 mL/min (ref >60.00)
Glucose, Bld: 108 mg/dL — ABNORMAL HIGH (ref 70–99)
Potassium: 5.1 mEq/L (ref 3.5–5.1)
Sodium: 140 mEq/L (ref 135–145)
Total Bilirubin: 0.4 mg/dL (ref 0.2–1.2)
Total Protein: 6.9 g/dL (ref 6.0–8.3)

## 2018-05-13 MED ORDER — GLYCOPYRROLATE-FORMOTEROL 9-4.8 MCG/ACT IN AERO: 1.0000 | INHALATION_SPRAY | Freq: Every day | RESPIRATORY_TRACT | 5 refills | Status: DC

## 2018-05-13 NOTE — Progress Notes (Signed)
Subjective  Chief Complaint  Patient presents with  . Annual Exam    no complaints doing well. needs mammogram, patient is fasting today  . Anemia    HPI: Sherry Zimmerman is a 62 y.o. female who presents to [place] at [facility] today for a Female Wellness Visit. She also has the concerns and/or needs as listed above in the chief complaint. These will be addressed in addition to the Health Maintenance Visit.   Wellness Visit: annual visit with health maintenance review and exam without Pap   HM: due for mammogram. Doing well. Still smoking: says couldn't afford the chantix. Endorses daily wheezing and some sob. No cp. Has never had pfts or inhaler. Isn't interested in quitting tobacco at this time; when she is ready, she "will stop cold [place]".  Chronic disease f/u and/or acute problem visit: (deemed necessary to be done in addition to the wellness visit):  F/u microcytic anemia, longterm. Feels energy is stable. No blood in stool. Check labs today. ? Nutritional iron deficiency. Paper thin nails as well.  Ataxia: falls: has had one since last visit.   Raynauds: significant bluish color changes in toes but w/o pain. occ has stinging.   OAB: meds help but cause dry mouth. No irritative sxs or gross hematuria.  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 Microcytic anemia   3. Tobacco dependence due to cigarettes   4. Breast cancer screening   5. Wheezing   6. Nail dystrophy      Plan  Female Wellness Visit:  Age appropriate Health Maintenance and Prevention measures were discussed with patient. Included topics are cancer screening recommendations, ways to keep healthy (see AVS) including dietary and exercise recommendations, regular eye and dental care, use of seat belts, and avoidance of moderate alcohol use and tobacco use. Mammogram ordered  BMI: discussed patient's BMI and encouraged positive lifestyle modifications to help get to or  maintain a target BMI.  HM needs and immunizations were addressed and ordered. See below for orders. See HM and immunization section for updates.  Routine labs and screening tests ordered including cmp, cbc and lipids where appropriate.  Discussed recommendations regarding Vit D and calcium supplementation (see AVS)  Chronic disease management visit and/or acute problem visit:  Ataxia: fall prevention; f/u with [facility] annually  Likely COPD: start bevespi daily; check pfts. Encourage smoking cessation  Anemia: start work up.   rec AWV  Raynauds: not symptomatic. No meds needed.   OAB: uses ditropan prn due to se of dry mouth. Wears adult depends as needed.   Orders Placed This Encounter  Procedures  . MM Digital Screening  . Iron, TIBC and Ferritin Panel  . CBC with Differential/Platelet  . Comprehensive metabolic panel  . Lipid panel  . Hemoglobinopathy Evaluation  . Pulmonary Function Test   Meds ordered this encounter  Medications  . Glycopyrrolate-Formoterol (BEVESPI AEROSPHERE) 9-4.8 MCG/ACT AERO    Sig: Inhale 1 puff into the lungs daily.    Dispense:  10.7 g    Refill:  5     Follow up: Return in about 6 months (around 11/12/2018) for recheck, AWV at patient's convenience.  Lifestyle: Body mass index is 19.48 kg/m. Wt Readings from Last 3 Encounters:  05/13/18 124 lb 6.4 oz (56.4 kg)  01/08/18 124 lb 3.2 oz (56.3 kg)  12/22/17 125 lb (56.7 kg)   Diet: general Exercise: never  Patient Active Problem List   Diagnosis Date Noted  . AR (  allergic rhinitis) 01/08/2018  . Microcytic anemia 01/08/2018  . OAB (overactive bladder) 11/09/2014  . 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 Pneumovax, hib and meningio vaccines given 11/2014, needs prevnar 01/2015   . 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 Maisie Fus in [place]; on disability   . Tobacco  dependence due to cigarettes 02/22/2014  . Migraine 08/15/2013    Overview:  STORY: does not prescription meds at this point.    Health Maintenance  Topic Date Due  . HIV Screening  01/01/1971  . MAMMOGRAM  05/29/2017  . INFLUENZA VACCINE  07/08/2018  . TETANUS/TDAP  12/08/2018  . COLONOSCOPY  03/09/2023  . Hepatitis C Screening  Completed   Immunization History  Administered Date(s) Administered  . DTaP / HiB 11/09/2014  . Influenza Split 11/19/2015  . Influenza, Seasonal, Injecte, Preservative Fre 10/20/2014  . Influenza,inj,Quad PF,6+ Mos 10/10/2016, 09/11/2017  . Influenza-Unspecified 09/26/2017  . Meningococcal Conjugate 11/09/2014, 01/05/2015  . Pneumococcal Conjugate-13 11/09/2014  . Pneumococcal Polysaccharide-23 05/02/2015  . Tdap 12/08/2008   We updated and reviewed the patient's past history in detail and it is documented below. Allergies: Patient is allergic to morphine and related; sulfa antibiotics; and sulfacetamide sodium. 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 Family History: Patient family history includes Ataxia in her mother; Cancer in her father; Diabetes in her mother; Heart disease in her mother; Stroke in her mother. Social History:  Patient  reports that she has been smoking cigarettes.  She has been smoking about 1.00 pack per day. She has never used smokeless tobacco. She reports that she drinks alcohol. She reports that she does not use drugs.  Review of Systems: Constitutional: negative for fever or malaise Ophthalmic: negative for photophobia, double vision or loss of vision Cardiovascular: negative for chest pain, dyspnea on exertion, or new LE  swelling Respiratory: Positive for SOB or persistent cough Gastrointestinal: negative for abdominal pain, change in bowel habits or melena Genitourinary: negative for dysuria or gross hematuria, no abnormal uterine bleeding or disharge Musculoskeletal: negative for new gait disturbance or muscular weakness Integumentary: negative for new or persistent rashes, no breast lumps Neurological: negative for TIA or stroke symptoms Psychiatric: negative for SI or delusions Allergic/Immunologic: negative for hives  Patient Care Team    Relationship Specialty Notifications Start End  Leamon Arnt, MD PCP - General Family Medicine  01/08/18     Objective  Vitals: BP 128/80   Pulse 93   Temp 97.9 F (36.6 C)   Ht 5\' 7"  (1.702 m)   Wt 124 lb 6.4 oz (56.4 kg)   BMI 19.48 kg/m  General:  Well developed, well nourished, no acute distress  Psych:  Alert and orientedx3,normal mood and affect HEENT:  Normocephalic, atraumatic, non-icteric sclera, PERRL, oropharynx is clear without mass or exudate, supple neck without adenopathy, mass or thyromegaly Cardiovascular:  Normal S1, S2, RRR without gallop, rub or murmur, nondisplaced PMI Respiratory:  Good breath sounds bilaterally, exp wheezes diffusely w/o rales, rhonchi  Gastrointestinal: normal bowel sounds, soft, non-tender, no noted masses. No HSM MSK: no deformities, contusions. Joints are without erythema or swelling. Spine and CVA region are nontender Skin:  Warm,  no rashes or suspicious lesions noted Neurologic:    Mental status is normal. CN 2-11 are normal.abnormal gait Breast Exam: No mass, skin retraction or nipple discharge is appreciated in either breast. No axillary adenopathy. Fibrocystic changes are not noted   Commons side effects, risks, benefits, and alternatives for medications and treatment plan prescribed today were discussed, and the patient expressed understanding of the given instructions. Patient is instructed to call or  message via MyChart if he/she has any questions or concerns regarding our treatment plan. No barriers to understanding were identified. We discussed Red Flag symptoms and signs in detail. Patient expressed understanding regarding what to do in case of urgent or emergency type symptoms.   Medication list was reconciled, printed and provided to the patient in AVS. Patient instructions and summary information was reviewed with the patient as documented in the AVS. This note was prepared with assistance of Dragon voice recognition software. Occasional wrong-word or sound-a-like substitutions may have occurred due to the inherent limitations of voice recognition software

## 2018-05-13 NOTE — Patient Instructions (Addendum)
Please return in 3 months for recheck of your breathing on the new inhaler (COPD). AWV at patients convenience please.  We will call you with information regarding your referral appointment. Mammogram and Pulmonary function tests. If you do not hear from [place] within the next 2 weeks, please let me know. It can take 1-2 weeks to get appointments set up with the specialists.   Medicare recommends an Annual Wellness Visit for all patients. Please schedule this to be done with our Nurse Educator, Maudie Mercury. This is an informative "talk" visit; it's goals are to ensure that your health care needs are being met and to give you education regarding avoiding falls, ensuring you are not suffering from depression or problems with memory or thinking, and to educate you on Advance Care Planning. It helps me take good care of you!   If you have any questions or concerns, please don't hesitate to send me a message via MyChart or call the office at [phone]. Thank you for visiting with [place] today! It's our pleasure caring for you.  Please do these things to maintain good health!   Exercise at least 30-45 minutes a day,  4-5 days a week.   Eat a low-fat diet with lots of fruits and vegetables, up to 7-9 servings per day.  Drink plenty of water daily. Try to drink 8 8oz glasses per day.  Seatbelts can save your life. Always wear your seatbelt.  Place Smoke Detectors on every level of your home and check batteries every year.  Schedule an appointment with an eye doctor for an eye exam every 1-2 years  Safe sex - use condoms to protect yourself from STDs if you could be exposed to these types of infections. Use birth control if you do not want to become pregnant and are sexually active.  Avoid heavy alcohol use. If you drink, keep it to less than 2 drinks/day and not every day.  [place].  Choose someone you trust that could speak for you if you became unable to speak for  yourself.  Depression is common in our stressful world.If you're feeling down or losing interest in things you normally enjoy, please come in for a visit.  If anyone is threatening or hurting you, please get help. Physical or Emotional Violence is never OK.   Please read the patient education material about Living [place]enie [place] and [place].    Both the Living Will and [place] require a Insurance account manager to [employer] on either document.  You may complete just one or both or neither of the documents. It is voluntaary. However, it can be very helpful if needed and I strongly encourage you to complete both forms stating your Advanced Directives. This will help me and your other healthcare providers care for you.  If you choose to complete either or both documents, please bring in or mail copies of the completed documents to my office so that I may put them in your medical chart. This way will ensure we have them if needed.

## 2018-05-13 NOTE — Progress Notes (Signed)
l °

## 2018-05-15 ENCOUNTER — Encounter: Payer: Self-pay | Admitting: Family Medicine

## 2018-05-17 ENCOUNTER — Encounter: Payer: Self-pay | Admitting: Family Medicine

## 2018-05-17 ENCOUNTER — Other Ambulatory Visit: Payer: Self-pay | Admitting: Emergency Medicine

## 2018-05-17 LAB — HEMOGLOBINOPATHY EVALUATION
Fetal Hemoglobin Testing: <1 % (ref 0.0–1.9)
HCT: 38.5 % (ref 35.0–45.0)
Hemoglobin A2 - HGBRFX: 1.8 % (ref 1.8–3.5)
Hemoglobin: 11.2 g/dL — ABNORMAL LOW (ref 11.7–15.5)
Hgb A: 97.2 % (ref >96.0)
MCH: 23.6 pg — ABNORMAL LOW (ref 27.0–33.0)
MCV: 81.2 fL (ref 80.0–100.0)
RBC: 4.74 10*6/uL (ref 3.80–5.10)
RDW: 15.5 % — ABNORMAL HIGH (ref 11.0–15.0)

## 2018-05-17 LAB — IRON,TIBC AND FERRITIN PANEL
%SAT: 8 % (calc) — ABNORMAL LOW (ref 11–50)
Ferritin: 9 ng/mL — ABNORMAL LOW (ref 20–288)
Iron: 41 ug/dL — ABNORMAL LOW (ref 45–160)
TIBC: 527 mcg/dL (calc) — ABNORMAL HIGH (ref 250–450)

## 2018-05-17 MED ORDER — ATORVASTATIN CALCIUM 10 MG PO TABS: 10.0000 mg | ORAL_TABLET | Freq: Every day | ORAL | 3 refills | Status: DC

## 2018-05-17 NOTE — Addendum Note (Signed)
Addended by: Billey Chang on: 05/17/2018 10:17 AM   Modules accepted: Orders

## 2018-05-21 ENCOUNTER — Encounter: Payer: Self-pay | Admitting: Family Medicine

## 2018-05-24 ENCOUNTER — Ambulatory Visit (INDEPENDENT_AMBULATORY_CARE_PROVIDER_SITE_OTHER): Payer: Medicare Other | Admitting: Internal Medicine

## 2018-05-24 DIAGNOSIS — R062 Wheezing: Secondary | ICD-10-CM

## 2018-05-24 DIAGNOSIS — F17210 Nicotine dependence, cigarettes, uncomplicated: Secondary | ICD-10-CM

## 2018-05-24 LAB — PULMONARY FUNCTION TEST
DL/VA % pred: 59 %
DL/VA: 3.19 ml/min/mmHg/L
DLCO unc % pred: 45 %
DLCO unc: 14.24 ml/min/mmHg
FEF 25-75 Post: 0.8 L/sec
FEF 25-75 Pre: 0.86 L/sec
FEF2575-%Change-Post: -7 %
FEF2575-%Pred-Post: 31 %
FEF2575-%Pred-Pre: 34 %
FEV1-%Change-Post: 0 %
FEV1-%Pred-Post: 60 %
FEV1-%Pred-Pre: 61 %
FEV1-Post: 1.8 L
FEV1-Pre: 1.82 L
FEV1FVC-%Change-Post: 2 %
FEV1FVC-%Pred-Pre: 67 %
FEV6-%Change-Post: -3 %
FEV6-%Pred-Post: 89 %
FEV6-%Pred-Pre: 93 %
FEV6-Post: 3.32 L
FEV6-Pre: 3.45 L
FEV6FVC-%Change-Post: 0 %
FEV6FVC-%Pred-Post: 102 %
FEV6FVC-%Pred-Pre: 102 %
FVC-%Change-Post: -2 %
FVC-%Pred-Post: 88 %
FVC-%Pred-Pre: 91 %
FVC-Post: 3.41 L
FVC-Pre: 3.5 L
Post FEV1/FVC ratio: 53 %
Post FEV6/FVC ratio: 98 %
Pre FEV1/FVC ratio: 52 %
Pre FEV6/FVC Ratio: 99 %
RV % pred: 136 %
RV: 3.16 L
TLC % pred: 108 %
TLC: 6.3 L

## 2018-05-24 NOTE — Progress Notes (Signed)
PFT completed 05/24/18  

## 2018-05-25 DIAGNOSIS — Z1231 Encounter for screening mammogram for malignant neoplasm of breast: Secondary | ICD-10-CM | POA: Diagnosis not present

## 2018-06-02 ENCOUNTER — Encounter: Payer: Self-pay | Admitting: Family Medicine

## 2018-06-18 ENCOUNTER — Inpatient Hospital Stay: Payer: Medicare Other

## 2018-06-18 ENCOUNTER — Inpatient Hospital Stay: Payer: Medicare Other | Attending: Hematology & Oncology | Admitting: Hematology & Oncology

## 2018-06-18 ENCOUNTER — Other Ambulatory Visit: Payer: Self-pay

## 2018-06-18 ENCOUNTER — Encounter: Payer: Self-pay | Admitting: Hematology & Oncology

## 2018-06-18 VITALS — BP 132/78 | HR 88 | Temp 98.0°F | Resp 20 | Wt 127.0 lb

## 2018-06-18 DIAGNOSIS — D500 Iron deficiency anemia secondary to blood loss (chronic): Secondary | ICD-10-CM | POA: Diagnosis not present

## 2018-06-18 DIAGNOSIS — D508 Other iron deficiency anemias: Secondary | ICD-10-CM | POA: Insufficient documentation

## 2018-06-18 DIAGNOSIS — G119 Hereditary ataxia, unspecified: Secondary | ICD-10-CM

## 2018-06-18 DIAGNOSIS — D509 Iron deficiency anemia, unspecified: Secondary | ICD-10-CM

## 2018-06-18 DIAGNOSIS — K909 Intestinal malabsorption, unspecified: Secondary | ICD-10-CM

## 2018-06-18 HISTORY — DX: Iron deficiency anemia secondary to blood loss (chronic): D50.0

## 2018-06-18 HISTORY — DX: Intestinal malabsorption, unspecified: K90.9

## 2018-06-18 LAB — CBC WITH DIFFERENTIAL (CANCER CENTER ONLY)
Basophils Absolute: 0.1 10*3/uL (ref 0.0–0.1)
Basophils Relative: 1 %
Eosinophils Absolute: 0.2 10*3/uL (ref 0.0–0.5)
Eosinophils Relative: 2 %
HCT: 33 % — ABNORMAL LOW (ref 34.8–46.6)
Hemoglobin: 9.9 g/dL — ABNORMAL LOW (ref 11.6–15.9)
Lymphocytes Relative: 34 %
Lymphs Abs: 3.2 10*3/uL (ref 0.9–3.3)
MCH: 24 pg — ABNORMAL LOW (ref 26.0–34.0)
MCHC: 30 g/dL — ABNORMAL LOW (ref 32.0–36.0)
MCV: 79.9 fL — ABNORMAL LOW (ref 81.0–101.0)
Monocytes Absolute: 1.3 10*3/uL — ABNORMAL HIGH (ref 0.1–0.9)
Monocytes Relative: 13 %
Neutro Abs: 4.7 10*3/uL (ref 1.5–6.5)
Neutrophils Relative %: 50 %
Platelet Count: 616 10*3/uL — ABNORMAL HIGH (ref 145–400)
RBC: 4.13 MIL/uL (ref 3.70–5.32)
RDW: 16.8 % — ABNORMAL HIGH (ref 11.1–15.7)
WBC Count: 9.4 10*3/uL (ref 3.9–10.0)

## 2018-06-18 LAB — CMP (CANCER CENTER ONLY)
ALT: 14 U/L (ref 0–44)
AST: 15 U/L (ref 15–41)
Albumin: 3.6 g/dL (ref 3.5–5.0)
Alkaline Phosphatase: 120 U/L (ref 38–126)
Anion gap: 12 (ref 5–15)
BUN: 13 mg/dL (ref 8–23)
CO2: 26 mmol/L (ref 22–32)
Calcium: 9.3 mg/dL (ref 8.9–10.3)
Chloride: 103 mmol/L (ref 98–111)
Creatinine: 0.8 mg/dL (ref 0.44–1.00)
GFR, Est AFR Am: >60 mL/min (ref >60)
GFR, Estimated: >60 mL/min (ref >60)
Glucose, Bld: 110 mg/dL — ABNORMAL HIGH (ref 70–99)
Potassium: 3.8 mmol/L (ref 3.5–5.1)
Sodium: 141 mmol/L (ref 135–145)
Total Bilirubin: 0.2 mg/dL — ABNORMAL LOW (ref 0.3–1.2)
Total Protein: 6.8 g/dL (ref 6.5–8.1)

## 2018-06-18 LAB — RETICULOCYTES
RBC.: 4.12 MIL/uL (ref 3.70–5.45)
Retic Count, Absolute: 45.3 10*3/uL (ref 33.7–90.7)
Retic Ct Pct: 1.1 % (ref 0.7–2.1)

## 2018-06-18 LAB — SAVE SMEAR

## 2018-06-18 NOTE — Progress Notes (Signed)
Referral MD  Reason for Referral: Iron deficiency anemia  Chief Complaint  Patient presents with  . Follow-up  : I am anemic.  HPI: Sherry Zimmerman is a very charming 62 year old white female.  She has cerebellar ataxia.  Apparently her mother also had ataxia.  She comes in7 with a rolling walker.  She had her spleen taken out a couple years ago.  This apparently ruptured.  From what the note says at the time, she apparently fell.  She was found to have a ruptured spleen.  She underwent a splenectomy on 10/26/2014.  At the time of her admission at that time, her hemoglobin was 8.4 with hematocrit of 20.7.  She thought that she may have been transfused.  Since then, she has been anemic.  She had iron studies done back in early June.  Her ferritin was 9 with iron saturation of 8%.  I do not think she has had any IV iron.  I think she has had some oral iron.  She does smoke.  She probably has about a 40-pack-year history of tobacco use.  She says she chews a lot of ice.  She gets tired very easily.  She did have some heavy monthly cycles.  She says she had a colonoscopy a few years ago.  This was normal.  She has had normal mammograms.  She was referred to the [facility] for consideration of IV iron.  She has no issues with diabetes.  She has had no rashes.  She has had no leg swelling.  Overall, her performance status is ECOG 1-2.   Past Medical History:  Diagnosis Date  . Anxiety   . AR (allergic rhinitis)   . Cerebellar ataxia ([place]) 2010  . Depression   . Raynaud disease 05/13/2018   feet  . Splenic rupture 10/26/14  :  Past Surgical History:  Procedure Laterality Date  . ABDOMINAL HYSTERECTOMY    . SPLENECTOMY, TOTAL N/A 10/26/2014   Procedure: SPLENECTOMY;  Surgeon: Georganna Skeans, MD;  Location: [facility];  Service: General;  Laterality: N/A;  . WRIST FRACTURE SURGERY Right   :   Current Outpatient Medications:  .  varenicline (CHANTIX) 1 MG  tablet, Take 1 mg by mouth 2 (two) times daily., Disp: , Rfl:  .  atorvastatin (LIPITOR) 10 MG tablet, Take 1 tablet (10 mg total) by mouth daily., Disp: 90 tablet, Rfl: 3 .  Glycopyrrolate-Formoterol (BEVESPI AEROSPHERE) 9-4.8 MCG/ACT AERO, Inhale 1 puff into the lungs daily., Disp: 10.7 g, Rfl: 5 .  lactose free nutrition (BOOST PLUS) LIQD, Take 237 mLs by mouth 3 (three) times daily with meals., Disp: , Rfl: 0 .  oxybutynin (DITROPAN) 5 MG tablet, Take 5 mg by mouth., Disp: , Rfl:  .  venlafaxine XR (EFFEXOR-XR) 150 MG 24 hr capsule, Take 1 capsule (150 mg total) by mouth at bedtime. Take with 75mg  cap for 225mg  total daily dose, Disp: 180 capsule, Rfl: 3 .  venlafaxine XR (EFFEXOR-XR) 75 MG 24 hr capsule, Take 1 capsule (75 mg total) by mouth daily. Take with 150mg  XR cap for 225mg  total daily dose, Disp: 90 capsule, Rfl: 3:  :  Allergies  Allergen Reactions  . Morphine And Related Other (See Comments)  . Sulfa Antibiotics Nausea And Vomiting  . Sulfacetamide Sodium Nausea And Vomiting  :  Family History  Problem Relation Age of Onset  . Ataxia Mother   . Stroke Mother   . Heart disease Mother   . Diabetes Mother   .  Cancer Father   :  Social History   Socioeconomic History  . Marital status: Divorced    Spouse name: Not on file  . Number of children: Not on file  . Years of education: Not on file  . Highest education level: Not on file  Occupational History  . Occupation: disabled  Social Needs  . Financial resource strain: Not on file  . Food insecurity:    Worry: Not on file    Inability: Not on file  . Transportation needs:    Medical: Not on file    Non-medical: Not on file  Tobacco Use  . Smoking status: Current Every Day Smoker    Packs/day: 1.00    Types: Cigarettes  . Smokeless tobacco: Never Used  Substance and Sexual Activity  . Alcohol use: Yes    Comment: socially  . Drug use: No  . Sexual activity: Never  Lifestyle  . Physical activity:     Days per week: Not on file    Minutes per session: Not on file  . Stress: Not on file  Relationships  . Social connections:    Talks on phone: Not on file    Gets together: Not on file    Attends religious service: Not on file    Active member of club or organization: Not on file    Attends meetings of clubs or organizations: Not on file    Relationship status: Not on file  . Intimate partner violence:    Fear of current or ex partner: Not on file    Emotionally abused: Not on file    Physically abused: Not on file    Forced sexual activity: Not on file  Other Topics Concern  . Not on file  Social History Narrative  . Not on file  :  Review of Systems  Constitutional: Positive for malaise/fatigue.  HENT: Negative.   Eyes: Negative.   Respiratory: Positive for shortness of breath.   Cardiovascular: Positive for palpitations.  Gastrointestinal: Negative.   Genitourinary: Negative.   Musculoskeletal: Positive for falls and myalgias.  Skin: Negative.   Neurological: Positive for focal weakness.  Endo/Heme/Allergies: Negative.   Psychiatric/Behavioral: Negative.      Exam: Fairly well-developed well-nourished white female in no obvious distress.  Vital signs show a temperature of 98.  Pulse 88.  Blood pressure 132/78.  Weight is 127 pounds.  Head neck exam shows no ocular or oral lesions.  She has no adenopathy in the neck.  Thyroid is nonpalpable.  Lungs are clear bilaterally.  Cardiac exam regular rate and rhythm with no murmurs, rubs or bruits.  Abdomen is soft.  She has good bowel sounds.  She has the splenectomy scar.  She has no hepatomegaly.  Back exam shows no tenderness over the spine, ribs or hips.  Extremities shows no clubbing, cyanosis or edema.  Neurological exam shows the ataxia.  Skin exam shows no rashes, ecchymoses or petechia. @IPVITALS @   Recent Labs    06/18/18 1353  WBC 9.4  HGB 9.9*  HCT 33.0*  PLT 616*   Recent Labs    06/18/18 1353  NA 141  K  3.8  CL 103  CO2 26  GLUCOSE 110*  BUN 13  CREATININE 0.80  CALCIUM 9.3    Blood smear review: Moderate anisocytosis and poikilocytosis.  She has microcytic red blood cells.  She has a few target cells.  I see some schistocytes.  She has some spherocytes.  There is some slight  polychromasia.  There is no nucleated red blood cells.  White cells appear normal in morphology maturation.  I see no hypersegmented polys.  There are no immature myeloid or lymphoid cells.  Platelets are increased in number.  Platelets are well granulated.  Pathology: None    Assessment and Plan: Sherry Zimmerman is a very nice 62 year old white female.  I think she is clearly has iron deficiency anemia.  Her blood smear is highly suggestive of this.  She had her spleen out.  Her thrombocytosis is reflective of having her spleen out but also I think of her iron deficiency.  We will see what her iron studies show.  I would suspect that she will need IV iron by the iron studies that were done a month ago.  I spent about 45 minutes with her.  All the time was spent face-to-face.  I did spend 10 minutes looking at her blood smear.  I talked to her about her lab results.  I explained how IV iron works.  She is agreeable to IV iron.  I do not see that a bone marrow needs to be done.  I do not see that we need to do any x-rays or scans.  She is up-to-date with her health care studies.  She is very nice.  I feel bad that she has this cerebellar ataxia.  I am convinced IV iron will help her stamina and hopefully this might help a little with her ataxia.  I will likely get her back to see [place] in another 4 weeks or so.

## 2018-06-21 ENCOUNTER — Telehealth: Payer: Self-pay | Admitting: *Deleted

## 2018-06-21 LAB — ERYTHROPOIETIN: Erythropoietin: 51.2 m[IU]/mL — ABNORMAL HIGH (ref 2.6–18.5)

## 2018-06-21 LAB — HEMOGLOBINOPATHY EVALUATION
Hgb A2 Quant: 1.8 % (ref 1.8–3.2)
Hgb A: 98.2 % (ref 96.4–98.8)
Hgb C: 0 %
Hgb F Quant: 0 % (ref 0.0–2.0)
Hgb S Quant: 0 %
Hgb Variant: 0 %

## 2018-06-21 LAB — IRON AND TIBC
Iron: 28 ug/dL — ABNORMAL LOW (ref 41–142)
Saturation Ratios: 6 % — ABNORMAL LOW (ref 21–57)
TIBC: 476 ug/dL — ABNORMAL HIGH (ref 236–444)
UIBC: 448 ug/dL

## 2018-06-21 LAB — FERRITIN: Ferritin: 9 ng/mL — ABNORMAL LOW (ref 11–307)

## 2018-06-21 LAB — LACTATE DEHYDROGENASE: LDH: 318 U/L — ABNORMAL HIGH (ref 98–192)

## 2018-06-21 NOTE — Telephone Encounter (Addendum)
Message sent to scheduler. Patient is aware of results.   ----- Message from Volanda Napoleon, MD sent at 06/21/2018 10:48 AM EDT ----- Call - iron is very low!!!  Needs 2 doses of feraheme  Sherry Zimmerman

## 2018-06-29 ENCOUNTER — Inpatient Hospital Stay: Payer: Medicare Other

## 2018-06-29 VITALS — BP 122/70 | HR 94 | Temp 98.3°F | Resp 16

## 2018-06-29 DIAGNOSIS — D500 Iron deficiency anemia secondary to blood loss (chronic): Secondary | ICD-10-CM

## 2018-06-29 DIAGNOSIS — G119 Hereditary ataxia, unspecified: Secondary | ICD-10-CM | POA: Diagnosis not present

## 2018-06-29 MED ORDER — FERUMOXYTOL INJECTION 510 MG/17 ML
510.0000 mg | Freq: Once | INTRAVENOUS | Status: AC
  Administered 2018-06-29: 510 mg via INTRAVENOUS
  Filled 2018-06-29: qty 17

## 2018-06-29 MED ORDER — SODIUM CHLORIDE 0.9 % IV SOLN
Freq: Once | INTRAVENOUS | Status: AC
  Administered 2018-06-29: 15:00:00 via INTRAVENOUS

## 2018-07-01 ENCOUNTER — Telehealth: Payer: Self-pay | Admitting: *Deleted

## 2018-07-01 NOTE — Telephone Encounter (Signed)
Patient received feraheme on Tuesday and stated that yesterday she had transient nausea and vomited x 1. She wants to know if this is from the feraheme infusion.   Explained to the patient that this is unlikely related to her infusion. She is due for another dose next week. We will observe for any similar symptoms after her next dose to see if there is any connection between the nausea and feraheme infusion.

## 2018-07-05 LAB — ALPHA-THALASSEMIA GENOTYPR

## 2018-07-06 ENCOUNTER — Inpatient Hospital Stay: Payer: Medicare Other

## 2018-07-06 VITALS — BP 135/65 | HR 91 | Resp 17

## 2018-07-06 DIAGNOSIS — D500 Iron deficiency anemia secondary to blood loss (chronic): Secondary | ICD-10-CM | POA: Diagnosis not present

## 2018-07-06 DIAGNOSIS — G119 Hereditary ataxia, unspecified: Secondary | ICD-10-CM | POA: Diagnosis not present

## 2018-07-06 MED ORDER — SODIUM CHLORIDE 0.9 % IV SOLN
510.0000 mg | Freq: Once | INTRAVENOUS | Status: AC
  Administered 2018-07-06: 510 mg via INTRAVENOUS
  Filled 2018-07-06: qty 17

## 2018-07-06 MED ORDER — SODIUM CHLORIDE 0.9 % IV SOLN
Freq: Once | INTRAVENOUS | Status: AC
  Administered 2018-07-06: 14:00:00 via INTRAVENOUS
  Filled 2018-07-06: qty 250

## 2018-07-27 ENCOUNTER — Other Ambulatory Visit: Payer: Self-pay | Admitting: *Deleted

## 2018-07-27 DIAGNOSIS — D500 Iron deficiency anemia secondary to blood loss (chronic): Secondary | ICD-10-CM

## 2018-07-28 ENCOUNTER — Encounter: Payer: Self-pay | Admitting: Hematology & Oncology

## 2018-07-28 ENCOUNTER — Inpatient Hospital Stay: Payer: Medicare Other | Attending: Hematology & Oncology | Admitting: Hematology & Oncology

## 2018-07-28 ENCOUNTER — Other Ambulatory Visit: Payer: Self-pay

## 2018-07-28 ENCOUNTER — Inpatient Hospital Stay: Payer: Medicare Other

## 2018-07-28 VITALS — BP 143/79 | HR 88 | Temp 97.5°F | Resp 20 | Wt 128.1 lb

## 2018-07-28 DIAGNOSIS — K909 Intestinal malabsorption, unspecified: Secondary | ICD-10-CM | POA: Insufficient documentation

## 2018-07-28 DIAGNOSIS — Z9081 Acquired absence of spleen: Secondary | ICD-10-CM | POA: Diagnosis not present

## 2018-07-28 DIAGNOSIS — G119 Hereditary ataxia, unspecified: Secondary | ICD-10-CM

## 2018-07-28 DIAGNOSIS — D500 Iron deficiency anemia secondary to blood loss (chronic): Secondary | ICD-10-CM | POA: Insufficient documentation

## 2018-07-28 DIAGNOSIS — Z79899 Other long term (current) drug therapy: Secondary | ICD-10-CM | POA: Insufficient documentation

## 2018-07-28 LAB — CBC WITH DIFFERENTIAL (CANCER CENTER ONLY)
Basophils Absolute: 0 10*3/uL (ref 0.0–0.1)
Basophils Relative: 0 %
Eosinophils Absolute: 0.2 10*3/uL (ref 0.0–0.5)
Eosinophils Relative: 2 %
HCT: 39 % (ref 34.8–46.6)
Hemoglobin: 11.7 g/dL (ref 11.6–15.9)
Lymphocytes Relative: 34 %
Lymphs Abs: 2.7 10*3/uL (ref 0.9–3.3)
MCH: 26.4 pg (ref 26.0–34.0)
MCHC: 30 g/dL — ABNORMAL LOW (ref 32.0–36.0)
MCV: 87.8 fL (ref 81.0–101.0)
Monocytes Absolute: 0.9 10*3/uL (ref 0.1–0.9)
Monocytes Relative: 11 %
Neutro Abs: 4.2 10*3/uL (ref 1.5–6.5)
Neutrophils Relative %: 53 %
Platelet Count: 494 10*3/uL — ABNORMAL HIGH (ref 145–400)
RBC: 4.44 MIL/uL (ref 3.70–5.32)
RDW: 23.5 % — ABNORMAL HIGH (ref 11.1–15.7)
WBC Count: 7.9 10*3/uL (ref 3.9–10.0)

## 2018-07-28 NOTE — Progress Notes (Signed)
Hematology and Oncology Follow Up Visit  Jeanann Balinski [id number] 1956-06-28 62 y.o. 07/28/2018   Principle Diagnosis:   Iron deficiency anemia secondary to malabsorption  Current Therapy:    IV iron as indicated     Interim History:  Ms. Antonio is back for follow-up.  We first saw her in July.  At that time, she had clear iron deficiency.  Her iron studies showed a ferritin of 9 with iron saturation of 6.  She got 2 doses of Feraheme.  Last dose was given on 07/06/2018.  She feels better.  She has a little bit more energy..  She has the cerebellar ataxia.  This does make things a bit more difficult for her.  She has not noted any obvious bleeding.  She is not chewing ice.  She is eating a little bit better.  She is not smoking now.  She hopes to be able to stop.  She is had no fever.  There is been no rashes.  She is had no cough or nausea or vomiting.  She has had no abdominal pain.  Para she gets around with a rolling walker and she does quite well with this.  Overall, her performance status is ECOG 2.  Medications:  Current Outpatient Medications:  .  atorvastatin (LIPITOR) 10 MG tablet, Take 1 tablet (10 mg total) by mouth daily., Disp: 90 tablet, Rfl: 3 .  Glycopyrrolate-Formoterol (BEVESPI AEROSPHERE) 9-4.8 MCG/ACT AERO, Inhale 1 puff into the lungs daily., Disp: 10.7 g, Rfl: 5 .  lactose free nutrition (BOOST PLUS) LIQD, Take 237 mLs by mouth 3 (three) times daily with meals., Disp: , Rfl: 0 .  oxybutynin (DITROPAN) 5 MG tablet, Take 5 mg by mouth every 8 (eight) hours as needed. , Disp: , Rfl:  .  varenicline (CHANTIX) 1 MG tablet, Take 1 mg by mouth 2 (two) times daily., Disp: , Rfl:  .  venlafaxine XR (EFFEXOR-XR) 150 MG 24 hr capsule, Take 1 capsule (150 mg total) by mouth at bedtime. Take with 75mg  cap for 225mg  total daily dose, Disp: 180 capsule, Rfl: 3 .  venlafaxine XR (EFFEXOR-XR) 75 MG 24 hr capsule, Take 1 capsule (75 mg total) by mouth daily. Take with  150mg  XR cap for 225mg  total daily dose, Disp: 90 capsule, Rfl: 3  Allergies:  Allergies  Allergen Reactions  . Morphine And Related Other (See Comments)  . Sulfa Antibiotics Nausea And Vomiting  . Sulfacetamide Sodium Nausea And Vomiting    Past Medical History, Surgical history, Social history, and Family History were reviewed and updated.  Review of Systems: Review of Systems  Constitutional: Negative.   HENT:  Negative.   Eyes: Negative.   Respiratory: Negative.   Cardiovascular: Negative.   Gastrointestinal: Negative.   Endocrine: Negative.   Genitourinary: Negative.    Musculoskeletal: Positive for gait problem.  Skin: Negative.   Neurological: Positive for gait problem.  Hematological: Negative.   Psychiatric/Behavioral: Negative.     Physical Exam:  weight is 128 lb 1.9 oz (58.1 kg). Her oral temperature is 97.5 F (36.4 C) (abnormal). Her blood pressure is 143/79 (abnormal) and her pulse is 88. Her respiration is 20 and oxygen saturation is 96%.   Wt Readings from Last 3 Encounters:  07/28/18 128 lb 1.9 oz (58.1 kg)  06/18/18 127 lb (57.6 kg)  05/13/18 124 lb 6.4 oz (56.4 kg)    Physical Exam  Constitutional: She is oriented to person, place, and time.  HENT:  Head: Normocephalic and atraumatic.  Mouth/Throat: Oropharynx is clear and moist.  Eyes: Pupils are equal, round, and reactive to light. EOM are normal.  Neck: Normal range of motion.  Cardiovascular: Normal rate, regular rhythm and normal heart sounds.  Pulmonary/Chest: Effort normal and breath sounds normal.  Abdominal: Soft. Bowel sounds are normal.  She has a well-healed splenectomy scar.  There is no hepatomegaly.  Musculoskeletal: Normal range of motion. She exhibits no edema, tenderness or deformity.  Lymphadenopathy:    She has no cervical adenopathy.  Neurological: She is alert and oriented to person, place, and time.  She has a ataxia.  Skin: Skin is warm and dry. No rash noted. No  erythema.  Psychiatric: She has a normal mood and affect. Her behavior is normal. Judgment and thought content normal.  Vitals reviewed.    Lab Results  Component Value Date   WBC 7.9 07/28/2018   HGB 11.7 07/28/2018   HCT 39.0 07/28/2018   MCV 87.8 07/28/2018   PLT 494 (H) 07/28/2018     Chemistry      Component Value Date/Time   NA 141 06/18/2018 1353   K 3.8 06/18/2018 1353   CL 103 06/18/2018 1353   CO2 26 06/18/2018 1353   BUN 13 06/18/2018 1353   CREATININE 0.80 06/18/2018 1353      Component Value Date/Time   CALCIUM 9.3 06/18/2018 1353   ALKPHOS 120 06/18/2018 1353   AST 15 06/18/2018 1353   ALT 14 06/18/2018 1353   BILITOT 0.2 (L) 06/18/2018 1353      Impression and Plan: Ms. Biss is a 62 year old white female.  She has iron deficiency anemia.  She is actually doing quite well.  She is responding.  Her hemoglobin has come up.  Her MCV has improved.  Her platelet count has come down.  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  I would like to get her back right before the holidays.  I want to make sure that her blood count is okay and that her iron levels are fine so she will feel well for the holiday season.   Volanda Napoleon, MD 8/21/20193:27 PM

## 2018-07-29 LAB — IRON AND TIBC
Iron: 61 ug/dL (ref 41–142)
Saturation Ratios: 20 % — ABNORMAL LOW (ref 21–57)
TIBC: 299 ug/dL (ref 236–444)
UIBC: 238 ug/dL

## 2018-07-29 LAB — FERRITIN: Ferritin: 191 ng/mL (ref 11–307)

## 2018-07-30 ENCOUNTER — Telehealth: Payer: Self-pay | Admitting: *Deleted

## 2018-07-30 NOTE — Telephone Encounter (Addendum)
Patient is aware of results. Appointment made  ----- Message from Volanda Napoleon, MD sent at 07/29/2018  2:30 PM EDT ----- Call - iron is still a tad low.  Need 1 dose of Feraheme!!  Sherry Zimmerman

## 2018-08-03 ENCOUNTER — Other Ambulatory Visit: Payer: Self-pay | Admitting: Family

## 2018-08-03 ENCOUNTER — Inpatient Hospital Stay: Payer: Medicare Other

## 2018-08-03 VITALS — BP 151/75 | HR 95 | Temp 98.4°F | Resp 19

## 2018-08-03 DIAGNOSIS — Z79899 Other long term (current) drug therapy: Secondary | ICD-10-CM | POA: Diagnosis not present

## 2018-08-03 DIAGNOSIS — G119 Hereditary ataxia, unspecified: Secondary | ICD-10-CM | POA: Diagnosis not present

## 2018-08-03 DIAGNOSIS — K909 Intestinal malabsorption, unspecified: Secondary | ICD-10-CM | POA: Diagnosis not present

## 2018-08-03 DIAGNOSIS — Z9081 Acquired absence of spleen: Secondary | ICD-10-CM | POA: Diagnosis not present

## 2018-08-03 DIAGNOSIS — D500 Iron deficiency anemia secondary to blood loss (chronic): Secondary | ICD-10-CM | POA: Diagnosis not present

## 2018-08-03 MED ORDER — SODIUM CHLORIDE 0.9 % IV SOLN
Freq: Once | INTRAVENOUS | Status: AC
  Administered 2018-08-03: 14:00:00 via INTRAVENOUS
  Filled 2018-08-03: qty 250

## 2018-08-03 MED ORDER — SODIUM CHLORIDE 0.9 % IV SOLN
510.0000 mg | Freq: Once | INTRAVENOUS | Status: AC
  Administered 2018-08-03: 510 mg via INTRAVENOUS
  Filled 2018-08-03: qty 17

## 2018-08-03 NOTE — Patient Instructions (Signed)

## 2018-08-13 ENCOUNTER — Ambulatory Visit (INDEPENDENT_AMBULATORY_CARE_PROVIDER_SITE_OTHER): Payer: Medicare Other | Admitting: Family Medicine

## 2018-08-13 ENCOUNTER — Other Ambulatory Visit: Payer: Self-pay

## 2018-08-13 ENCOUNTER — Encounter: Payer: Self-pay | Admitting: Family Medicine

## 2018-08-13 VITALS — BP 118/64 | HR 71 | Temp 98.2°F | Ht 67.0 in | Wt 128.6 lb

## 2018-08-13 DIAGNOSIS — D500 Iron deficiency anemia secondary to blood loss (chronic): Secondary | ICD-10-CM | POA: Diagnosis not present

## 2018-08-13 DIAGNOSIS — J449 Chronic obstructive pulmonary disease, unspecified: Secondary | ICD-10-CM | POA: Diagnosis not present

## 2018-08-13 DIAGNOSIS — F339 Major depressive disorder, recurrent, unspecified: Secondary | ICD-10-CM | POA: Diagnosis not present

## 2018-08-13 DIAGNOSIS — Z79899 Other long term (current) drug therapy: Secondary | ICD-10-CM

## 2018-08-13 DIAGNOSIS — F17210 Nicotine dependence, cigarettes, uncomplicated: Secondary | ICD-10-CM

## 2018-08-13 DIAGNOSIS — Z23 Encounter for immunization: Secondary | ICD-10-CM | POA: Diagnosis not present

## 2018-08-13 DIAGNOSIS — K909 Intestinal malabsorption, unspecified: Secondary | ICD-10-CM

## 2018-08-13 HISTORY — DX: Major depressive disorder, recurrent, unspecified: F33.9

## 2018-08-13 LAB — LIPID PANEL
Cholesterol: 150 mg/dL (ref 0–200)
HDL: 60.9 mg/dL (ref >39.00)
LDL Cholesterol: 71 mg/dL (ref 0–99)
NonHDL: 88.97
Total CHOL/HDL Ratio: 2
Triglycerides: 91 mg/dL (ref 0.0–149.0)
VLDL: 18.2 mg/dL (ref 0.0–40.0)

## 2018-08-13 LAB — COMPREHENSIVE METABOLIC PANEL
ALT: 20 U/L (ref 0–35)
AST: 18 U/L (ref 0–37)
Albumin: 4.1 g/dL (ref 3.5–5.2)
Alkaline Phosphatase: 100 U/L (ref 39–117)
BUN: 14 mg/dL (ref 6–23)
CO2: 33 mEq/L — ABNORMAL HIGH (ref 19–32)
Calcium: 10 mg/dL (ref 8.4–10.5)
Chloride: 106 mEq/L (ref 96–112)
Creatinine, Ser: 0.78 mg/dL (ref 0.40–1.20)
GFR: 79.13 mL/min (ref >60.00)
Glucose, Bld: 98 mg/dL (ref 70–99)
Potassium: 5.6 mEq/L — ABNORMAL HIGH (ref 3.5–5.1)
Sodium: 144 mEq/L (ref 135–145)
Total Bilirubin: 0.4 mg/dL (ref 0.2–1.2)
Total Protein: 6.7 g/dL (ref 6.0–8.3)

## 2018-08-13 LAB — CBC WITH DIFFERENTIAL/PLATELET
Basophils Absolute: 0.1 10*3/uL (ref 0.0–0.1)
Basophils Relative: 0.9 % (ref 0.0–3.0)
Eosinophils Absolute: 0.2 10*3/uL (ref 0.0–0.7)
Eosinophils Relative: 2.2 % (ref 0.0–5.0)
HCT: 40.4 % (ref 36.0–46.0)
Hemoglobin: 12.9 g/dL (ref 12.0–15.0)
Lymphocytes Relative: 40.1 % (ref 12.0–46.0)
Lymphs Abs: 3.2 10*3/uL (ref 0.7–4.0)
MCHC: 32.1 g/dL (ref 30.0–36.0)
MCV: 85.9 fl (ref 78.0–100.0)
Monocytes Absolute: 0.8 10*3/uL (ref 0.1–1.0)
Monocytes Relative: 10.4 % (ref 3.0–12.0)
Neutro Abs: 3.7 10*3/uL (ref 1.4–7.7)
Neutrophils Relative %: 46.4 % (ref 43.0–77.0)
Platelets: 447 10*3/uL — ABNORMAL HIGH (ref 150.0–400.0)
RBC: 4.7 Mil/uL (ref 3.87–5.11)
RDW: 26.1 % — ABNORMAL HIGH (ref 11.5–15.5)
WBC: 7.9 10*3/uL (ref 4.0–10.5)

## 2018-08-13 MED ORDER — GLYCOPYRROLATE-FORMOTEROL 9-4.8 MCG/ACT IN AERO: 2.0000 | INHALATION_SPRAY | Freq: Two times a day (BID) | RESPIRATORY_TRACT | 5 refills | Status: DC

## 2018-08-13 NOTE — Patient Instructions (Addendum)
Please return in June for your annual complete physical; please come fasting. Annual wellness visit due in June 2020 as well.   If you have any questions or concerns, please don't hesitate to send me a message via MyChart or call the office at [phone]. Thank you for visiting with [place] today! It's our pleasure caring for you.  QUIT smoking completely. You can do this!! Try the nicotine gum.  This will help fix your lungs.  Increase the Bevespi to 2 inhalation TWICE a day. This is the correct dose.   Keep taking your other medications.

## 2018-08-13 NOTE — Addendum Note (Signed)
Addended bySigurd Sos on: 08/13/2018 02:33 PM   Modules accepted: Orders

## 2018-08-13 NOTE — Progress Notes (Signed)
Subjective  CC:  Chief Complaint  Patient presents with  . COPD    3 month follow up, doing ok. Requests flu shot today     HPI: Sherry Zimmerman is a 62 y.o. female who presents to the office today to address the problems listed above in the chief complaint.  Increased ASCVD score started on lipitor in June and she is tolerating this well.  Nonfasting today but here for repeat lab work and liver function tests  Follow-up iron deficiency anemia due to malabsorption, reviewed notes from hematology.  Has received 2 IV iron infusions and is set up to receive 1 more.  Her chronic anemia is now normalizing.  She is no longer experiencing ice cravings however does not feel significantly different.  Her energy is fine.  She eats a regular diet  Reviewed pulmonary function tests which showed moderate obstructive disease consistent with emphysema.  Had a decreased forced vital capacity showing some restriction.  This could be due to her muscular disease.  She has been using her Bevespi daily and has noticed significant improvement in her breathing and less wheezing.  Smoking cessation: Decided to try to quit smoking.  She is using Chantix without emotional or physical side effects.  Down to 2 cigarettes on most days.  Still with some cravings. Assessment  1. Iron deficiency anemia due to chronic blood loss   2. Malabsorption of iron   3. COPD with chronic bronchitis ([place])   4. Tobacco dependence due to cigarettes   5. Major depression, recurrent, chronic ([place])   6. On statin therapy due to risk of future cardiovascular event      Plan   Iron deficiency anemia: Resolving.  Per hematology, complete another iron infusion and will monitor every 6 to 12 months.  COPD by PFTs improved on inhaler.  Continue current medications and reinforced need to quit smoking completely.  Continue Chantix for the next 4 to 12 weeks and add nicotine gum as needed for cravings.  Major depression is stable no  adverse effects from additional Chantix  Hyperlipidemia: On Lipitor.  Recheck levels today  Influenza vaccination given  Follow up: CPE due in June 2020 with annual wellness visit sooner as needed  Orders Placed This Encounter  Procedures  . Lipid panel  . Comprehensive metabolic panel  . CBC with Differential/Platelet   Meds ordered this encounter  Medications  . Glycopyrrolate-Formoterol (BEVESPI AEROSPHERE) 9-4.8 MCG/ACT AERO    Sig: Inhale 2 puffs into the lungs 2 (two) times daily.    Dispense:  10.7 g    Refill:  5      I reviewed the patients updated PMH, FH, and SocHx.    Patient Active Problem List   Diagnosis Date Noted  . Major depression, recurrent, chronic ([place]) 08/13/2018  . Iron deficiency anemia due to chronic blood loss 06/18/2018  . 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 Raynaud disease 05/13/2018  . COPD with chronic bronchitis ([place]) 05/13/2018  . AR (allergic rhinitis) 01/08/2018  . Microcytic anemia 01/08/2018  . OAB (overactive bladder) 11/09/2014  . 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 Tobacco dependence due to cigarettes 02/22/2014  . Migraine 08/15/2013   Current Meds  Medication Sig  . atorvastatin (LIPITOR) 10 MG tablet Take 1 tablet (10 mg total) by mouth daily.  . Glycopyrrolate-Formoterol (BEVESPI AEROSPHERE) 9-4.8 MCG/ACT AERO Inhale 2 puffs into the lungs 2 (two) times daily.  Marland Kitchen lactose free nutrition (BOOST PLUS) LIQD  Take 237 mLs by mouth 3 (three) times daily with meals.  Marland Kitchen oxybutynin (DITROPAN) 5 MG tablet Take 5 mg by mouth every 8 (eight) hours as needed.   . varenicline (CHANTIX) 1 MG tablet Take 1 mg by mouth 2 (two) times daily.  Marland Kitchen venlafaxine XR (EFFEXOR-XR) 150 MG 24 hr capsule Take 1 capsule (150 mg total) by mouth at bedtime. Take with 75mg  cap for 225mg  total daily dose  . venlafaxine XR (EFFEXOR-XR) 75 MG 24 hr capsule Take 1 capsule (75 mg total) by mouth daily. Take with 150mg  XR cap  for 225mg  total daily dose  . [DISCONTINUED] Glycopyrrolate-Formoterol (BEVESPI AEROSPHERE) 9-4.8 MCG/ACT AERO Inhale 1 puff into the lungs daily.    Allergies: Patient is allergic to morphine and related; sulfa antibiotics; and sulfacetamide sodium. Family History: Patient family history includes Ataxia in her mother; Cancer in her father; Diabetes in her mother; Heart disease in her mother; Stroke in her mother. Social History:  Patient  reports that she has been smoking cigarettes. She has been smoking about 1.00 pack per day. She has never used smokeless tobacco. She reports that she drinks alcohol. She reports that she does not use drugs.  Review of Systems: Constitutional: Negative for fever malaise or anorexia Cardiovascular: negative for chest pain Respiratory: negative for SOB or persistent cough Gastrointestinal: negative for abdominal pain  Objective  Vitals: BP 118/64   Pulse 71   Temp 98.2 F (36.8 C)   Ht 5\' 7"  (1.702 m)   Wt 128 lb 9.6 oz (58.3 kg)   SpO2 95%   BMI 20.14 kg/m  General: no acute distress , A&Ox3 HEENT: PEERL, conjunctiva normal, Oropharynx moist,neck is supple Cardiovascular:  RRR without murmur or gallop.  Respiratory:  Good breath sounds bilaterally, CTAB with normal respiratory effort Skin:  Warm, no rashes  No visits with results within 1 Day(s) from this visit.  Latest known visit with results is:  Appointment on 07/28/2018  Component Date Value Ref Range Status  . Iron 07/28/2018 61  41 - 142 ug/dL Final  . TIBC 07/28/2018 299  236 - 444 ug/dL Final  . Saturation Ratios 07/28/2018 20* 21 - 57 % Final  . UIBC 07/28/2018 238  ug/dL Final  . Ferritin 07/28/2018 191  11 - 307 ng/mL Final  . WBC Count 07/28/2018 7.9  3.9 - 10.0 K/uL Final  . RBC 07/28/2018 4.44  3.70 - 5.32 MIL/uL Final  . Hemoglobin 07/28/2018 11.7  11.6 - 15.9 g/dL Final  . HCT 07/28/2018 39.0  34.8 - 46.6 % Final  . MCV 07/28/2018 87.8  81.0 - 101.0 fL Final  . MCH  07/28/2018 26.4  26.0 - 34.0 pg Final  . MCHC 07/28/2018 30.0* 32.0 - 36.0 g/dL Final  . RDW 07/28/2018 23.5* 11.1 - 15.7 % Final  . Platelet Count 07/28/2018 494* 145 - 400 K/uL Final  . Neutrophils Relative % 07/28/2018 53  % Final  . Neutro Abs 07/28/2018 4.2  1.5 - 6.5 K/uL Final  . Lymphocytes Relative 07/28/2018 34  % Final  . Lymphs Abs 07/28/2018 2.7  0.9 - 3.3 K/uL Final  . Monocytes Relative 07/28/2018 11  % Final  . Monocytes Absolute 07/28/2018 0.9  0.1 - 0.9 K/uL Final  . Eosinophils Relative 07/28/2018 2  % Final  . Eosinophils Absolute 07/28/2018 0.2  0.0 - 0.5 K/uL Final  . Basophils Relative 07/28/2018 0  % Final  . Basophils Absolute 07/28/2018 0.0  0.0 - 0.1 K/uL Final  Commons side effects, risks, benefits, and alternatives for medications and treatment plan prescribed today were discussed, and the patient expressed understanding of the given instructions. Patient is instructed to call or message via MyChart if he/she has any questions or concerns regarding our treatment plan. No barriers to understanding were identified. We discussed Red Flag symptoms and signs in detail. Patient expressed understanding regarding what to do in case of urgent or emergency type symptoms.   Medication list was reconciled, printed and provided to the patient in AVS. Patient instructions and summary information was reviewed with the patient as documented in the AVS. This note was prepared with assistance of Dragon voice recognition software. Occasional wrong-word or sound-a-like substitutions may have occurred due to the inherent limitations of voice recognition software

## 2018-08-16 ENCOUNTER — Other Ambulatory Visit: Payer: Self-pay

## 2018-08-16 DIAGNOSIS — E875 Hyperkalemia: Secondary | ICD-10-CM

## 2018-08-23 ENCOUNTER — Encounter: Payer: Self-pay | Admitting: Family Medicine

## 2018-08-23 ENCOUNTER — Other Ambulatory Visit: Payer: Medicare Other

## 2018-08-23 ENCOUNTER — Other Ambulatory Visit (INDEPENDENT_AMBULATORY_CARE_PROVIDER_SITE_OTHER): Payer: Medicare Other

## 2018-08-23 DIAGNOSIS — E875 Hyperkalemia: Secondary | ICD-10-CM | POA: Diagnosis not present

## 2018-08-23 LAB — BASIC METABOLIC PANEL
BUN: 12 mg/dL (ref 6–23)
CO2: 33 mEq/L — ABNORMAL HIGH (ref 19–32)
Calcium: 9.7 mg/dL (ref 8.4–10.5)
Chloride: 104 mEq/L (ref 96–112)
Creatinine, Ser: 0.89 mg/dL (ref 0.40–1.20)
GFR: 67.95 mL/min (ref >60.00)
Glucose, Bld: 91 mg/dL (ref 70–99)
Potassium: 4.7 mEq/L (ref 3.5–5.1)
Sodium: 142 mEq/L (ref 135–145)

## 2018-10-28 ENCOUNTER — Other Ambulatory Visit: Payer: Self-pay

## 2018-10-28 ENCOUNTER — Inpatient Hospital Stay: Payer: Medicare Other | Attending: Hematology & Oncology | Admitting: Hematology & Oncology

## 2018-10-28 ENCOUNTER — Encounter: Payer: Self-pay | Admitting: Hematology & Oncology

## 2018-10-28 ENCOUNTER — Inpatient Hospital Stay: Payer: Medicare Other

## 2018-10-28 VITALS — BP 139/75 | HR 84 | Temp 97.6°F | Resp 19 | Wt 133.0 lb

## 2018-10-28 DIAGNOSIS — Z79899 Other long term (current) drug therapy: Secondary | ICD-10-CM

## 2018-10-28 DIAGNOSIS — Z9081 Acquired absence of spleen: Secondary | ICD-10-CM

## 2018-10-28 DIAGNOSIS — K909 Intestinal malabsorption, unspecified: Secondary | ICD-10-CM | POA: Insufficient documentation

## 2018-10-28 DIAGNOSIS — R7989 Other specified abnormal findings of blood chemistry: Secondary | ICD-10-CM | POA: Diagnosis not present

## 2018-10-28 DIAGNOSIS — D500 Iron deficiency anemia secondary to blood loss (chronic): Secondary | ICD-10-CM | POA: Diagnosis not present

## 2018-10-28 LAB — CBC WITH DIFFERENTIAL (CANCER CENTER ONLY)
Abs Immature Granulocytes: 0.01 10*3/uL (ref 0.00–0.07)
Basophils Absolute: 0.1 10*3/uL (ref 0.0–0.1)
Basophils Relative: 1 %
Eosinophils Absolute: 0.2 10*3/uL (ref 0.0–0.5)
Eosinophils Relative: 2 %
HCT: 45 % (ref 36.0–46.0)
Hemoglobin: 13.5 g/dL (ref 12.0–15.0)
Immature Granulocytes: 0 %
Lymphocytes Relative: 34 %
Lymphs Abs: 2.9 10*3/uL (ref 0.7–4.0)
MCH: 30.1 pg (ref 26.0–34.0)
MCHC: 30 g/dL (ref 30.0–36.0)
MCV: 100.2 fL — ABNORMAL HIGH (ref 80.0–100.0)
Monocytes Absolute: 0.9 10*3/uL (ref 0.1–1.0)
Monocytes Relative: 10 %
Neutro Abs: 4.5 10*3/uL (ref 1.7–7.7)
Neutrophils Relative %: 53 %
Platelet Count: 463 10*3/uL — ABNORMAL HIGH (ref 150–400)
RBC: 4.49 MIL/uL (ref 3.87–5.11)
RDW: 14.8 % (ref 11.5–15.5)
WBC Count: 8.5 10*3/uL (ref 4.0–10.5)
nRBC: 0 % (ref 0.0–0.2)

## 2018-10-28 LAB — RETICULOCYTES
Immature Retic Fract: 5.2 % (ref 2.3–15.9)
RBC.: 4.49 MIL/uL (ref 3.87–5.11)
Retic Count, Absolute: 41.8 10*3/uL (ref 19.0–186.0)
Retic Ct Pct: 0.9 % (ref 0.4–3.1)

## 2018-10-28 NOTE — Progress Notes (Signed)
Hematology and Oncology Follow Up Visit  Sherry Zimmerman [id number] 1956/11/08 62 y.o. 10/28/2018   Principle Diagnosis:   Iron deficiency anemia secondary to malabsorption  Current Therapy:    IV iron as indicated --IV Feraheme given on 08/03/2018     Interim History:  Sherry Zimmerman is back for follow-up.  We first saw her in July.  She is responded very nicely to IV iron.  Her hemoglobin is come up so well.  She feels better.  She is not chewing ice.  She has more energy.  She has the cerebellar ataxia.  She gets around with a rolling walker.  I must say that she is doing quite well.   When we last saw her in August, her ferritin was 191 with an iron saturation of 20%.  She is looking forward to the holiday season.  She will be with her family.  She is had no problems with fever.  She has had some issues with allergies.  She has had allergies in the fall are always worse for her.  She has had no change in bowel or bladder habits.   Overall, her performance status is ECOG 2.  Medications:  Current Outpatient Medications:  .  atorvastatin (LIPITOR) 10 MG tablet, Take 1 tablet (10 mg total) by mouth daily., Disp: 90 tablet, Rfl: 3 .  Glycopyrrolate-Formoterol (BEVESPI AEROSPHERE) 9-4.8 MCG/ACT AERO, Inhale 2 puffs into the lungs 2 (two) times daily., Disp: 10.7 g, Rfl: 5 .  lactose free nutrition (BOOST PLUS) LIQD, Take 237 mLs by mouth 3 (three) times daily with meals., Disp: , Rfl: 0 .  oxybutynin (DITROPAN) 5 MG tablet, Take 5 mg by mouth every 8 (eight) hours as needed. , Disp: , Rfl:  .  varenicline (CHANTIX) 1 MG tablet, Take 1 mg by mouth 2 (two) times daily., Disp: , Rfl:  .  venlafaxine XR (EFFEXOR-XR) 150 MG 24 hr capsule, Take 1 capsule (150 mg total) by mouth at bedtime. Take with 75mg  cap for 225mg  total daily dose, Disp: 180 capsule, Rfl: 3 .  venlafaxine XR (EFFEXOR-XR) 75 MG 24 hr capsule, Take 1 capsule (75 mg total) by mouth daily. Take with 150mg  XR cap for  225mg  total daily dose, Disp: 90 capsule, Rfl: 3  Allergies:  Allergies  Allergen Reactions  . Morphine And Related Other (See Comments)  . Sulfa Antibiotics Nausea And Vomiting  . Sulfacetamide Sodium Nausea And Vomiting    Past Medical History, Surgical history, Social history, and Family History were reviewed and updated.  Review of Systems: Review of Systems  Constitutional: Negative.   HENT:  Negative.   Eyes: Negative.   Respiratory: Negative.   Cardiovascular: Negative.   Gastrointestinal: Negative.   Endocrine: Negative.   Genitourinary: Negative.    Musculoskeletal: Positive for gait problem.  Skin: Negative.   Neurological: Positive for gait problem.  Hematological: Negative.   Psychiatric/Behavioral: Negative.     Physical Exam:  weight is 133 lb (60.3 kg). Her oral temperature is 97.6 F (36.4 C). Her blood pressure is 139/75 and her pulse is 84. Her respiration is 19 and oxygen saturation is 100%.   Wt Readings from Last 3 Encounters:  10/28/18 133 lb (60.3 kg)  08/13/18 128 lb 9.6 oz (58.3 kg)  07/28/18 128 lb 1.9 oz (58.1 kg)    Physical Exam  Constitutional: She is oriented to person, place, and time.  HENT:  Head: Normocephalic and atraumatic.  Mouth/Throat: Oropharynx is clear and moist.  Eyes: Pupils are  equal, round, and reactive to light. EOM are normal.  Neck: Normal range of motion.  Cardiovascular: Normal rate, regular rhythm and normal heart sounds.  Pulmonary/Chest: Effort normal and breath sounds normal.  Abdominal: Soft. Bowel sounds are normal.  She has a well-healed splenectomy scar.  There is no hepatomegaly.  Musculoskeletal: Normal range of motion. She exhibits no edema, tenderness or deformity.  Lymphadenopathy:    She has no cervical adenopathy.  Neurological: She is alert and oriented to person, place, and time.  She has a ataxia.  Skin: Skin is warm and dry. No rash noted. No erythema.  Psychiatric: She has a normal mood and  affect. Her behavior is normal. Judgment and thought content normal.  Vitals reviewed.    Lab Results  Component Value Date   WBC 8.5 10/28/2018   HGB 13.5 10/28/2018   HCT 45.0 10/28/2018   MCV 100.2 (H) 10/28/2018   PLT 463 (H) 10/28/2018     Chemistry      Component Value Date/Time   NA 142 08/23/2018 1446   K 4.7 08/23/2018 1446   CL 104 08/23/2018 1446   CO2 33 (H) 08/23/2018 1446   BUN 12 08/23/2018 1446   CREATININE 0.89 08/23/2018 1446   CREATININE 0.80 06/18/2018 1353      Component Value Date/Time   CALCIUM 9.7 08/23/2018 1446   ALKPHOS 100 08/13/2018 1326   AST 18 08/13/2018 1326   AST 15 06/18/2018 1353   ALT 20 08/13/2018 1326   ALT 14 06/18/2018 1353   BILITOT 0.4 08/13/2018 1326   BILITOT 0.2 (L) 06/18/2018 1353      Impression and Plan: Sherry Zimmerman is a 62 year old white female.  She has iron deficiency anemia.  She is actually doing quite well.  She is responding.  Her hemoglobin has come up.  Her MCV has improved.  Her platelet count has come down.  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  At this point, I think that we can let her go from the clinic.  She has responded beautifully.  I just do not think that we are going to make a difference right now.  I know that it is tough for her to get here.  As such, I just do not want to make life difficult for her.  I told her that I would be more than happy to have her come back when she feels that she needs to have blood work done.  She is definitely okay with this.  Volanda Napoleon, MD 11/21/20192:39 PM

## 2018-10-29 ENCOUNTER — Encounter: Payer: Self-pay | Admitting: *Deleted

## 2018-10-29 LAB — IRON AND TIBC
Iron: 103 ug/dL (ref 41–142)
Saturation Ratios: 37 % (ref 21–57)
TIBC: 279 ug/dL (ref 236–444)
UIBC: 176 ug/dL (ref 120–384)

## 2018-10-29 LAB — FERRITIN: Ferritin: 182 ng/mL (ref 11–307)

## 2018-11-29 ENCOUNTER — Telehealth: Payer: Self-pay | Admitting: *Deleted

## 2018-11-29 NOTE — Telephone Encounter (Signed)
Received disability forms, per Dr. Jonni Sanger verify if this is completed by her or neurologist.

## 2018-11-29 NOTE — Telephone Encounter (Signed)
Pt states that she only sees Neurology once a year and asking if Dr. Jonni Sanger will complete papers.

## 2018-12-29 NOTE — Telephone Encounter (Signed)
Patient states she just received a letter from Disability that they have not yet received the papers/ please advise. [phone]

## 2018-12-29 NOTE — Telephone Encounter (Signed)
Pt aware forms need to be completed by neurologist. Forms have been mailed to Dr. Thom Chimes

## 2019-01-04 ENCOUNTER — Encounter: Payer: Self-pay | Admitting: Family Medicine

## 2019-01-10 DIAGNOSIS — G43009 Migraine without aura, not intractable, without status migrainosus: Secondary | ICD-10-CM | POA: Insufficient documentation

## 2019-01-11 DIAGNOSIS — G43009 Migraine without aura, not intractable, without status migrainosus: Secondary | ICD-10-CM | POA: Diagnosis not present

## 2019-01-11 DIAGNOSIS — G112 Late-onset cerebellar ataxia: Secondary | ICD-10-CM | POA: Diagnosis not present

## 2019-02-06 ENCOUNTER — Encounter: Payer: Self-pay | Admitting: Family Medicine

## 2019-02-07 MED ORDER — VENLAFAXINE HCL ER 75 MG PO CP24: 75.0000 mg | ORAL_CAPSULE | Freq: Every day | ORAL | 3 refills | Status: DC

## 2019-02-09 ENCOUNTER — Encounter: Payer: Self-pay | Admitting: Family Medicine

## 2019-02-09 ENCOUNTER — Other Ambulatory Visit: Payer: Self-pay | Admitting: Family Medicine

## 2019-03-17 ENCOUNTER — Encounter: Payer: Self-pay | Admitting: Family Medicine

## 2019-05-06 ENCOUNTER — Other Ambulatory Visit: Payer: Self-pay | Admitting: Family Medicine

## 2019-06-13 ENCOUNTER — Other Ambulatory Visit: Payer: Self-pay | Admitting: Family Medicine

## 2019-08-02 DIAGNOSIS — G112 Late-onset cerebellar ataxia: Secondary | ICD-10-CM | POA: Diagnosis not present

## 2019-08-02 DIAGNOSIS — G43009 Migraine without aura, not intractable, without status migrainosus: Secondary | ICD-10-CM | POA: Diagnosis not present

## 2019-08-09 DIAGNOSIS — Z1231 Encounter for screening mammogram for malignant neoplasm of breast: Secondary | ICD-10-CM | POA: Diagnosis not present

## 2019-08-09 DIAGNOSIS — Z1239 Encounter for other screening for malignant neoplasm of breast: Secondary | ICD-10-CM | POA: Diagnosis not present

## 2019-08-11 ENCOUNTER — Other Ambulatory Visit: Payer: Self-pay | Admitting: Family Medicine

## 2019-08-26 ENCOUNTER — Encounter: Payer: Self-pay | Admitting: Family Medicine

## 2019-08-29 ENCOUNTER — Ambulatory Visit (INDEPENDENT_AMBULATORY_CARE_PROVIDER_SITE_OTHER): Payer: Medicare Other | Admitting: Family Medicine

## 2019-08-29 ENCOUNTER — Encounter: Payer: Self-pay | Admitting: Family Medicine

## 2019-08-29 ENCOUNTER — Other Ambulatory Visit: Payer: Self-pay

## 2019-08-29 VITALS — BP 126/60 | HR 83 | Temp 97.7°F | Resp 16 | Ht 67.0 in | Wt 124.4 lb

## 2019-08-29 DIAGNOSIS — N3281 Overactive bladder: Secondary | ICD-10-CM

## 2019-08-29 DIAGNOSIS — Z23 Encounter for immunization: Secondary | ICD-10-CM | POA: Diagnosis not present

## 2019-08-29 DIAGNOSIS — R8290 Unspecified abnormal findings in urine: Secondary | ICD-10-CM

## 2019-08-29 DIAGNOSIS — J449 Chronic obstructive pulmonary disease, unspecified: Secondary | ICD-10-CM

## 2019-08-29 DIAGNOSIS — N3000 Acute cystitis without hematuria: Secondary | ICD-10-CM | POA: Diagnosis not present

## 2019-08-29 LAB — POCT URINALYSIS DIPSTICK
Bilirubin, UA: NEGATIVE
Blood, UA: NEGATIVE
Glucose, UA: NEGATIVE
Ketones, UA: NEGATIVE
Nitrite, UA: POSITIVE
Protein, UA: NEGATIVE
Spec Grav, UA: 1.015 (ref 1.010–1.025)
Urobilinogen, UA: 0.2 E.U./dL
pH, UA: 6.5 (ref 5.0–8.0)

## 2019-08-29 MED ORDER — BEVESPI AEROSPHERE 9-4.8 MCG/ACT IN AERO: 2.0000 | INHALATION_SPRAY | Freq: Two times a day (BID) | RESPIRATORY_TRACT | 5 refills | Status: DC

## 2019-08-29 MED ORDER — SHINGRIX 50 MCG/0.5ML IM SUSR: 0.5000 mL | Freq: Once | INTRAMUSCULAR | 0 refills | Status: AC

## 2019-08-29 MED ORDER — OXYBUTYNIN CHLORIDE 5 MG PO TABS: 5.0000 mg | ORAL_TABLET | Freq: Three times a day (TID) | ORAL | 3 refills | Status: DC | PRN

## 2019-08-29 MED ORDER — CIPROFLOXACIN HCL 500 MG PO TABS: 500.0000 mg | ORAL_TABLET | Freq: Two times a day (BID) | ORAL | 0 refills | Status: AC

## 2019-08-29 NOTE — Progress Notes (Signed)
Subjective   CC:  Chief Complaint  Patient presents with  . COPD  . Strong smelling urine    Has been going on for a few months.. Reports pressure after urination.. Denies frequency and dysuria    HPI: Sherry Zimmerman is a 63 y.o. female who presents to the office today to address the problems listed above in the chief complaint.  Patient reports the above sxs x several months.  She has sensation of increased urinary pressure, especially after voiding. The urine tends to stay dark and smell strongly like ammonia; she drinks tea all day long, little water intake.   She denies fevers flank pain nausea vomiting or gross hematuria.  Symptoms have been present for several hours to days.  She denies history of interstitial cystitis.  She does take oxybutinin for OAB sxs and needs a refill. She denies vaginal symptoms including vaginal discharge or pelvic pain.   Copd is stable. Needs refill of inhaler.   Overdue for HM visit, labs and AWV.   Flu shot today.   Assessment  1. Acute cystitis without hematuria   2. COPD with chronic bronchitis ([place])   3. OAB (overactive bladder)   4. Abnormal urine odor      Plan   Bladder infection: culture urine and treat with cipro. Increase water intake. F/u if not improving.  Refilled oab meds.  Copd; refilled bevespi  Flu shot today; printed script for Shingrix. rec cpe and awv.   Follow up: CPE and AWV.   Orders Placed This Encounter  Procedures  . Urine Culture  . POCT urinalysis dipstick   Meds ordered this encounter  Medications  . oxybutynin (DITROPAN) 5 MG tablet    Sig: Take 1 tablet (5 mg total) by mouth every 8 (eight) hours as needed.    Dispense:  90 tablet    Refill:  3  . Glycopyrrolate-Formoterol (BEVESPI AEROSPHERE) 9-4.8 MCG/ACT AERO    Sig: Inhale 2 puffs into the lungs 2 (two) times daily.    Dispense:  10.7 g    Refill:  5  . ciprofloxacin (CIPRO) 500 MG tablet    Sig: Take 1 tablet (500 mg total) by mouth 2  (two) times daily for 7 days.    Dispense:  14 tablet    Refill:  0  . Zoster Vaccine Adjuvanted [facility]) injection    Sig: Inject 0.5 mLs into the muscle once for 1 dose. Please give 2nd dose 2-6 months after first dose    Dispense:  2 each    Refill:  0      I reviewed the patients updated PMH, FH, and SocHx.    Patient Active Problem List   Diagnosis Date Noted  . Major depression, recurrent, chronic ([place]) 08/13/2018  . Iron deficiency anemia due to chronic blood loss 06/18/2018  . 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 Raynaud disease 05/13/2018  . COPD with chronic bronchitis ([place]) 05/13/2018  . AR (allergic rhinitis) 01/08/2018  . Microcytic anemia 01/08/2018  . OAB (overactive bladder) 11/09/2014  . 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 Tobacco dependence due to cigarettes 02/22/2014  . Migraine 08/15/2013   Current Meds  Medication Sig  . atorvastatin (LIPITOR) 10 MG tablet Take 1 tablet by mouth once daily  . Glycopyrrolate-Formoterol (BEVESPI AEROSPHERE) 9-4.8 MCG/ACT AERO Inhale 2 puffs into the lungs 2 (two) times daily.  Marland Kitchen lactose free nutrition (BOOST PLUS) LIQD Take 237 mLs by mouth 3 (three) times  daily with meals.  Marland Kitchen oxybutynin (DITROPAN) 5 MG tablet Take 1 tablet (5 mg total) by mouth every 8 (eight) hours as needed.  . venlafaxine XR (EFFEXOR-XR) 150 MG 24 hr capsule TAKE 1 CAPSULE BY MOUTH ONCE DAILY AT BEDTIME **TAKE  WITH  75MG   FOR  225MG   TOTAL  DAILY  DOSE**  . venlafaxine XR (EFFEXOR-XR) 75 MG 24 hr capsule Take 1 capsule (75 mg total) by mouth daily. Take with 150mg  XR cap for 225mg  total daily dose  . [DISCONTINUED] Glycopyrrolate-Formoterol (BEVESPI AEROSPHERE) 9-4.8 MCG/ACT AERO Inhale 2 puffs into the lungs 2 (two) times daily.  . [DISCONTINUED] oxybutynin (DITROPAN) 5 MG tablet Take 5 mg by mouth every 8 (eight) hours as needed.     Review of Systems: Cardiovascular: negative for chest pain Respiratory:  negative for SOB or persistent cough Gastrointestinal: negative for abdominal pain Constitutional: Negative for fever malaise or anorexia  Objective  Vitals: BP 126/60   Pulse 83   Temp 97.7 F (36.5 C) (Tympanic)   Resp 16   Ht 5\' 7"  (1.702 m)   Wt 124 lb 6.4 oz (56.4 kg)   SpO2 97%   BMI 19.48 kg/m  General: no acute distress  Psych:  Alert and oriented, normal mood and affect Cardiovascular:  RRR without murmur or gallop. no peripheral edema Respiratory:  Good breath sounds bilaterally, CTAB with normal respiratory effort Gastrointestinal: soft, flat abdomen, normal active bowel sounds, no palpable masses, no hepatosplenomegaly, no appreciated hernias, NO CVAT, no suprapubic ttp w/o rebound or guarding   Office Visit on 08/29/2019  Component Date Value Ref Range Status  . Color, UA 08/29/2019 Yellow   Final  . Clarity, UA 08/29/2019 Cloudy   Final  . Glucose, UA 08/29/2019 Negative  Negative Final  . Bilirubin, UA 08/29/2019 Negative   Final  . Ketones, UA 08/29/2019 Negative   Final  . Spec Grav, UA 08/29/2019 1.015  1.010 - 1.025 Final  . Blood, UA 08/29/2019 Negative   Final  . pH, UA 08/29/2019 6.5  5.0 - 8.0 Final  . Protein, UA 08/29/2019 Negative  Negative Final  . Urobilinogen, UA 08/29/2019 0.2  0.2 or 1.0 E.U./dL Final  . Nitrite, UA 08/29/2019 Positive   Final  . Leukocytes, UA 08/29/2019 Small (1+)* Negative Final  . Odor 08/29/2019 Yes   Final    Commons side effects, risks, benefits, and alternatives for medications and treatment plan prescribed today were discussed, and the patient expressed understanding of the given instructions. Patient is instructed to call or message via MyChart if he/she has any questions or concerns regarding our treatment plan. No barriers to understanding were identified. We discussed Red Flag symptoms and signs in detail. Patient expressed understanding regarding what to do in case of urgent or emergency type symptoms.   Medication  list was reconciled, printed and provided to the patient in AVS. Patient instructions and summary information was reviewed with the patient as documented in the AVS. This note was prepared with assistance of Dragon voice recognition software. Occasional wrong-word or sound-a-like substitutions may have occurred due to the inherent limitations of voice recognition software

## 2019-08-29 NOTE — Patient Instructions (Addendum)
Please return for your annual complete physical; please come fasting. It has been over a year since your last.   Medicare recommends an Annual Wellness Visit for all patients. Please schedule this to be done with our Nurse Educator, Loma Sousa. This is an informative "talk" visit; it's goals are to ensure that your health care needs are being met and to give you education regarding avoiding falls, ensuring you are not suffering from depression or problems with memory or thinking, and to educate you on Advance Care Planning. It helps me take good care of you!  If you have any questions or concerns, please don't hesitate to send me a message via MyChart or call the office at [phone]. Thank you for visiting with [place] today! It's our pleasure caring for you.   Urinary Tract Infection, Adult  A urinary tract infection (UTI) is an infection of any part of the urinary tract. The urinary tract includes the kidneys, ureters, bladder, and urethra. These organs make, store, and get rid of urine in the body. Your health care provider may use other names to describe the infection. An upper UTI affects the ureters and kidneys (pyelonephritis). A lower UTI affects the bladder (cystitis) and urethra (urethritis). What are the causes? Most urinary tract infections are caused by bacteria in your genital area, around the entrance to your urinary tract (urethra). These bacteria grow and cause inflammation of your urinary tract. What increases the risk? You are more likely to develop this condition if:  You have a urinary catheter that stays in place (indwelling).  You are not able to control when you urinate or have a bowel movement (you have incontinence).  You are female and you: ? Use a spermicide or diaphragm for birth control. ? Have low estrogen levels. ? Are pregnant.  You have certain genes that increase your risk (genetics).  You are sexually active.  You take antibiotic medicines.  You have a  condition that causes your flow of urine to slow down, such as: ? An enlarged prostate, if you are female. ? Blockage in your urethra (stricture). ? A kidney stone. ? A nerve condition that affects your bladder control (neurogenic bladder). ? Not getting enough to drink, or not urinating often.  You have certain medical conditions, such as: ? Diabetes. ? A weak disease-fighting system (immunesystem). ? Sickle cell disease. ? Gout. ? Spinal cord injury. What are the signs or symptoms? Symptoms of this condition include:  Needing to urinate right away (urgently).  Frequent urination or passing small amounts of urine frequently.  Pain or burning with urination.  Blood in the urine.  Urine that smells bad or unusual.  Trouble urinating.  Cloudy urine.  Vaginal discharge, if you are female.  Pain in the abdomen or the lower back. You may also have:  Vomiting or a decreased appetite.  Confusion.  Irritability or tiredness.  A fever.  Diarrhea. The first symptom in older adults may be confusion. In some cases, they may not have any symptoms until the infection has worsened. How is this diagnosed? This condition is diagnosed based on your medical history and a physical exam. You may also have other tests, including:  Urine tests.  Blood tests.  Tests for sexually transmitted infections (STIs). If you have had more than one UTI, a cystoscopy or imaging studies may be done to determine the cause of the infections. How is this treated? Treatment for this condition includes:  Antibiotic medicine.  Over-the-counter medicines to treat discomfort.  Drinking enough water to stay hydrated. If you have frequent infections or have other conditions such as a kidney stone, you may need to see a health care provider who specializes in the urinary tract (urologist). In rare cases, urinary tract infections can cause sepsis. Sepsis is a life-threatening condition that occurs when  the body responds to an infection. Sepsis is treated in the hospital with IV antibiotics, fluids, and other medicines. Follow these instructions at home:  Medicines  Take over-the-counter and prescription medicines only as told by your health care provider.  If you were prescribed an antibiotic medicine, take it as told by your health care provider. Do not stop using the antibiotic even if you start to feel better. General instructions  Make sure you: ? Empty your bladder often and completely. Do not hold urine for long periods of time. ? Empty your bladder after sex. ? Wipe from front to back after a bowel movement if you are female. Use each tissue one time when you wipe.  Drink enough fluid to keep your urine pale yellow.  Keep all follow-up visits as told by your health care provider. This is important. Contact a health care provider if:  Your symptoms do not get better after 1-2 days.  Your symptoms go away and then return. Get help right away if you have:  Severe pain in your back or your lower abdomen.  A fever.  Nausea or vomiting. Summary  A urinary tract infection (UTI) is an infection of any part of the urinary tract, which includes the kidneys, ureters, bladder, and urethra.  Most urinary tract infections are caused by bacteria in your genital area, around the entrance to your urinary tract (urethra).  Treatment for this condition often includes antibiotic medicines.  If you were prescribed an antibiotic medicine, take it as told by your health care provider. Do not stop using the antibiotic even if you start to feel better.  Keep all follow-up visits as told by your health care provider. This is important. This information is not intended to replace advice given to you by your health care provider. Make sure you discuss any questions you have with your health care provider. Document Released: 09/03/2005 Document Revised: 11/11/2018 Document Reviewed:  06/03/2018 Elsevier Patient Education  2020 [employer].

## 2019-08-31 LAB — URINE CULTURE
MICRO NUMBER:: 903775
SPECIMEN QUALITY:: ADEQUATE

## 2019-08-31 NOTE — Progress Notes (Signed)
+   UTI sensitive to abx used. No further action needed

## 2019-09-02 ENCOUNTER — Encounter: Payer: Self-pay | Admitting: Family Medicine

## 2019-09-13 ENCOUNTER — Encounter: Payer: Self-pay | Admitting: Family Medicine

## 2019-09-13 MED ORDER — ATORVASTATIN CALCIUM 10 MG PO TABS: 10.0000 mg | ORAL_TABLET | Freq: Every day | ORAL | 2 refills | Status: DC

## 2019-10-11 ENCOUNTER — Encounter: Payer: Self-pay | Admitting: Family Medicine

## 2019-10-25 ENCOUNTER — Encounter: Payer: Medicare Other | Admitting: Family Medicine

## 2019-11-15 ENCOUNTER — Telehealth: Payer: Self-pay | Admitting: Family Medicine

## 2019-11-15 NOTE — Telephone Encounter (Signed)
I left a message asking the patient to call and schedule Medicare AWV with Loma Sousa ([place]) on 11/18/2019 after seeing Dr. Jonni Sanger.  Im waiting for a call back to either confirm or decline the appointment. If patient calls back, please update appointment notes.  VDM ([place])

## 2019-11-17 ENCOUNTER — Other Ambulatory Visit: Payer: Self-pay

## 2019-11-18 ENCOUNTER — Encounter: Payer: Self-pay | Admitting: Family Medicine

## 2019-11-18 ENCOUNTER — Ambulatory Visit (INDEPENDENT_AMBULATORY_CARE_PROVIDER_SITE_OTHER): Payer: Medicare Other | Admitting: Family Medicine

## 2019-11-18 ENCOUNTER — Ambulatory Visit (INDEPENDENT_AMBULATORY_CARE_PROVIDER_SITE_OTHER): Payer: Medicare Other

## 2019-11-18 VITALS — BP 124/72 | Temp 97.3°F | Ht 67.0 in | Wt 123.9 lb

## 2019-11-18 VITALS — BP 124/72 | HR 69 | Temp 97.3°F | Ht 67.0 in | Wt 124.0 lb

## 2019-11-18 DIAGNOSIS — Z0000 Encounter for general adult medical examination without abnormal findings: Secondary | ICD-10-CM | POA: Diagnosis not present

## 2019-11-18 DIAGNOSIS — E782 Mixed hyperlipidemia: Secondary | ICD-10-CM | POA: Insufficient documentation

## 2019-11-18 DIAGNOSIS — N3281 Overactive bladder: Secondary | ICD-10-CM | POA: Diagnosis not present

## 2019-11-18 DIAGNOSIS — Z79899 Other long term (current) drug therapy: Secondary | ICD-10-CM

## 2019-11-18 DIAGNOSIS — F339 Major depressive disorder, recurrent, unspecified: Secondary | ICD-10-CM | POA: Diagnosis not present

## 2019-11-18 DIAGNOSIS — F17210 Nicotine dependence, cigarettes, uncomplicated: Secondary | ICD-10-CM | POA: Diagnosis not present

## 2019-11-18 DIAGNOSIS — G112 Late-onset cerebellar ataxia: Secondary | ICD-10-CM

## 2019-11-18 DIAGNOSIS — J449 Chronic obstructive pulmonary disease, unspecified: Secondary | ICD-10-CM

## 2019-11-18 DIAGNOSIS — K909 Intestinal malabsorption, unspecified: Secondary | ICD-10-CM

## 2019-11-18 DIAGNOSIS — D75838 Other thrombocytosis: Secondary | ICD-10-CM

## 2019-11-18 DIAGNOSIS — Z9081 Acquired absence of spleen: Secondary | ICD-10-CM

## 2019-11-18 DIAGNOSIS — D473 Essential (hemorrhagic) thrombocythemia: Secondary | ICD-10-CM

## 2019-11-18 HISTORY — DX: Other long term (current) drug therapy: Z79.899

## 2019-11-18 LAB — CBC WITH DIFFERENTIAL/PLATELET
Basophils Absolute: 0.1 10*3/uL (ref 0.0–0.1)
Basophils Relative: 0.8 % (ref 0.0–3.0)
Eosinophils Absolute: 0.1 10*3/uL (ref 0.0–0.7)
Eosinophils Relative: 1.1 % (ref 0.0–5.0)
HCT: 45.8 % (ref 36.0–46.0)
Hemoglobin: 14.7 g/dL (ref 12.0–15.0)
Lymphocytes Relative: 27.4 % (ref 12.0–46.0)
Lymphs Abs: 3.2 10*3/uL (ref 0.7–4.0)
MCHC: 32.1 g/dL (ref 30.0–36.0)
MCV: 93.7 fl (ref 78.0–100.0)
Monocytes Absolute: 0.9 10*3/uL (ref 0.1–1.0)
Monocytes Relative: 8 % (ref 3.0–12.0)
Neutro Abs: 7.4 10*3/uL (ref 1.4–7.7)
Neutrophils Relative %: 62.7 % (ref 43.0–77.0)
Platelets: 402 10*3/uL — ABNORMAL HIGH (ref 150.0–400.0)
RBC: 4.89 Mil/uL (ref 3.87–5.11)
RDW: 14.2 % (ref 11.5–15.5)
WBC: 11.8 10*3/uL — ABNORMAL HIGH (ref 4.0–10.5)

## 2019-11-18 LAB — COMPREHENSIVE METABOLIC PANEL
ALT: 13 U/L (ref 0–35)
AST: 17 U/L (ref 0–37)
Albumin: 4.3 g/dL (ref 3.5–5.2)
Alkaline Phosphatase: 98 U/L (ref 39–117)
BUN: 13 mg/dL (ref 6–23)
CO2: 29 mEq/L (ref 19–32)
Calcium: 9.8 mg/dL (ref 8.4–10.5)
Chloride: 104 mEq/L (ref 96–112)
Creatinine, Ser: 0.84 mg/dL (ref 0.40–1.20)
GFR: 68.07 mL/min (ref >60.00)
Glucose, Bld: 97 mg/dL (ref 70–99)
Potassium: 4.6 mEq/L (ref 3.5–5.1)
Sodium: 142 mEq/L (ref 135–145)
Total Bilirubin: 0.5 mg/dL (ref 0.2–1.2)
Total Protein: 7.3 g/dL (ref 6.0–8.3)

## 2019-11-18 LAB — LIPID PANEL
Cholesterol: 147 mg/dL (ref 0–200)
HDL: 60.1 mg/dL (ref >39.00)
LDL Cholesterol: 73 mg/dL (ref 0–99)
NonHDL: 87.08
Total CHOL/HDL Ratio: 2
Triglycerides: 70 mg/dL (ref 0.0–149.0)
VLDL: 14 mg/dL (ref 0.0–40.0)

## 2019-11-18 LAB — TSH: TSH: 2.02 u[IU]/mL (ref 0.35–4.50)

## 2019-11-18 MED ORDER — BEVESPI AEROSPHERE 9-4.8 MCG/ACT IN AERO: 2.0000 | INHALATION_SPRAY | Freq: Two times a day (BID) | RESPIRATORY_TRACT | 11 refills | Status: DC

## 2019-11-18 NOTE — Progress Notes (Signed)
Subjective  Chief Complaint  Patient presents with  . Annual Exam  . Anemia  . COPD    HPI: Sherry Zimmerman is a 63 y.o. female who presents to [facility] [place] today for a Female Wellness Visit. She also has the concerns and/or needs as listed above in the chief complaint. These will be addressed in addition to the Health Maintenance Visit.   Wellness Visit: annual visit with health maintenance review and exam without Pap   HM: AWV today. Lung cancer screen: never has had. Other screens are up to date. Will rec dexa starting next year. Defer tdap due to cost.  Chronic disease f/u and/or acute problem visit: (deemed necessary to be done in addition to the wellness visit):  Depression: well controlled. No mood issues. Does endorse fatigue: sleeps a lot!   Copd: ran out of inhaler but it did help. No cough, hemptysis, doe. Has occ wheeze. Smoker.   OAB: does well on ditropan. rcent uti resolved  H/o iron malabsorption with chronic anemia and thrombocytosis. Due to fatigue sxs, due for recheck. No pica  Statin w/o myalgias.   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 No injuries.   Assessment  1. Major depression, recurrent, chronic ([place])   2. COPD with chronic bronchitis ([place])   3. 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 Tobacco dependence due to cigarettes   5. OAB (overactive bladder)   6. Malabsorption of iron   7. On statin therapy due to risk of future cardiovascular event   8. 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 Included topics are cancer screening recommendations, ways to keep healthy (see AVS) including dietary and exercise recommendations, regular eye and dental care, use of seat belts, and avoidance of moderate alcohol use and tobacco use. Can discuss lung cancer screening with pt at [facility]. Other screens are up  to date  BMI: discussed patient's BMI and encouraged positive lifestyle modifications to help get to or maintain a target BMI.  HM needs and immunizations were addressed and ordered. See below for orders. See HM and immunization section for updates.  Routine labs and screening tests ordered including cmp, cbc and lipids where appropriate.  Discussed recommendations regarding Vit D and calcium supplementation (see AVS)  Chronic disease management visit and/or acute problem visit:  Smoker: not interested in quitting  COPD; refilled inhalers. No flares.   H/o anemia and iron deficiency: fatigue. Recheck levels.   Statin tx; recheck lfts and lipids today.  Mood is well controlled. Denies depressive sxs.   AWV today. Follow up: No follow-ups on file.  Orders Placed This Encounter  Procedures  . CBC w/Diff  . CMP  . Lipids  . Iron, TIBC Ferritin  . TSH   Meds ordered this encounter  Medications  . Glycopyrrolate-Formoterol (BEVESPI AEROSPHERE) 9-4.8 MCG/ACT AERO    Sig: Inhale 2 puffs into the lungs 2 (two) times daily.    Dispense:  10.7 g    Refill:  11      Lifestyle: Body mass index is 19.42 kg/m. Wt Readings from Last 3 Encounters:  11/18/19 124 lb (56.2 kg)  08/29/19 124 lb 6.4 oz (56.4 kg)  10/28/18 133 lb (60.3 kg)     Patient Active Problem List   Diagnosis Date Noted  . On statin therapy due to risk of future cardiovascular event 11/18/2019  LDL 115, ASCVD 8.4 2018: started lipitor.   . 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 Migraine without aura and without status migrainosus, not intractable 01/10/2019  . Major depression, recurrent, chronic ([place]) 08/13/2018    Managed with effexor, chronically   . Acquired iron deficiency anemia due to decreased absorption 06/18/2018  . 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 Raynaud disease 05/13/2018    feet   . COPD with chronic bronchitis ([place]) 05/13/2018  . AR (allergic rhinitis) 01/08/2018    . OAB (overactive bladder) 11/09/2014  . 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 Pneumovax, hib and meningio vaccines given 11/2014, needs prevnar 01/2015   . 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 Maisie Fus in [place]; on disability   . Tobacco dependence due to cigarettes 02/22/2014   Health Maintenance  Topic Date Due  . TETANUS/TDAP  12/08/2018  . MAMMOGRAM  08/08/2020  . COLONOSCOPY  03/09/2023  . INFLUENZA VACCINE  Completed  . Hepatitis C Screening  Completed  . HIV Screening  Discontinued   Immunization History  Administered Date(s) Administered  . DTaP / HiB 11/09/2014  . Influenza Split 11/19/2015  . Influenza, Seasonal, Injecte, Preservative Fre 10/20/2014  . Influenza,inj,Quad PF,6+ Mos 10/10/2016, 09/11/2017, 08/13/2018, 08/29/2019  . Influenza-Unspecified 09/26/2017  . Meningococcal Conjugate 11/09/2014, 01/05/2015  . Pneumococcal Conjugate-13 11/09/2014  . Pneumococcal Polysaccharide-23 05/02/2015  . Tdap 12/08/2008  . Zoster Recombinat (Shingrix) 08/31/2019   We updated and reviewed the patient's past history in detail and it is documented below. Allergies: Patient is allergic to morphine and related; sulfa antibiotics; and sulfacetamide sodium. Past Medical History Patient  has a past medical history of Anxiety, AR (allergic rhinitis), Cerebellar ataxia ([place]) (2010), Depression, Iron deficiency anemia due to chronic blood loss (06/18/2018), Major depression, recurrent, chronic ([place]) (08/13/2018), Malabsorption of iron (06/18/2018), On statin therapy due to risk of future cardiovascular event (11/18/2019), Raynaud disease (05/13/2018), and Splenic rupture (10/26/14). 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 Family History: Patient  family history includes Ataxia in her mother; Cancer in her father; Diabetes in her mother; Heart disease in her mother; Stroke in her mother. Social History:  Patient  reports that she has been smoking cigarettes. She has never used smokeless tobacco. She reports current alcohol use. She reports that she does not use drugs.  Review of Systems: Constitutional: negative for fever or malaise Ophthalmic: negative for photophobia, double vision or loss of vision Cardiovascular: negative for chest pain, dyspnea on exertion, or new LE swelling Respiratory: negative for SOB or persistent cough Gastrointestinal: negative for abdominal pain, change in bowel habits or melena Genitourinary: negative for dysuria or gross hematuria, no abnormal uterine bleeding or disharge Musculoskeletal: negative for new gait disturbance or muscular weakness Integumentary: negative for new or persistent rashes, no breast lumps Neurological: negative for TIA or stroke symptoms Psychiatric: negative for SI or delusions Allergic/Immunologic: negative for hives  Patient Care Team    Relationship Specialty Notifications Start End  Leamon Arnt, MD PCP - General Family Medicine  01/08/18   Leland Johns., MD Referring Physician Psychiatry  12/29/18     Objective  Vitals: BP 124/72 (BP Location: Left Arm, Patient Position: Sitting, Cuff Size: Normal)   Pulse 69   Temp (!) 97.3 F (36.3 C) (Temporal)   Ht 5\' 7"  (  1.702 m)   Wt 124 lb (56.2 kg)   SpO2 96%   BMI 19.42 kg/m  General:  Well developed, well nourished, no acute distress  Psych:  Alert and orientedx3,normal mood and affect HEENT:  Normocephalic, atraumatic, non-icteric sclera, PERRL, oropharynx is clear without mass or exudate, supple neck without adenopathy, mass or thyromegaly Cardiovascular:  Normal S1, S2, RRR without gallop, rub or murmur, nondisplaced PMI Respiratory:  Good breath sounds bilaterally, CTAB with normal respiratory  effort Gastrointestinal: normal bowel sounds, soft, non-tender, no noted masses. No HSM MSK: no deformities, contusions. Joints are without erythema or swelling.  Skin:  Warm, no rashes or suspicious lesions noted, pale nail beds Neurologic:    Mental status is normal.    Commons side effects, risks, benefits, and alternatives for medications and treatment plan prescribed today were discussed, and the patient expressed understanding of the given instructions. Patient is instructed to call or message via MyChart if he/she has any questions or concerns regarding our treatment plan. No barriers to understanding were identified. We discussed Red Flag symptoms and signs in detail. Patient expressed understanding regarding what to do in case of urgent or emergency type symptoms.   Medication list was reconciled, printed and provided to the patient in AVS. Patient instructions and summary information was reviewed with the patient as documented in the AVS. This note was prepared with assistance of Dragon voice recognition software. Occasional wrong-word or sound-a-like substitutions may have occurred due to the inherent limitations of voice recognition software  This visit occurred during the SARS-CoV-2 public health emergency.  Safety protocols were in place, including screening questions prior to the visit, additional usage of staff PPE, and extensive cleaning of exam room while observing appropriate contact time as indicated for disinfecting solutions.

## 2019-11-18 NOTE — Patient Instructions (Signed)
Sherry Zimmerman , Thank you for taking time to come for your Medicare Wellness Visit. I appreciate your ongoing commitment to your health goals. Please review the following plan we discussed and let me know if I can assist you in the future.   Screening recommendations/referrals: Colorectal Screening: up to date; last colonoscopy 03/2013 Mammogram: up to date; last 08/09/19 Bone Density: starting at age 64  Vision and Dental Exams: Recommended annual ophthalmology exams for early detection of glaucoma and other disorders of the eye Recommended annual dental exams for proper oral hygiene  Vaccinations: Influenza vaccine: completed 08/29/19 Pneumococcal vaccine: up to date; last 05/02/15 Tdap vaccine: recommended; Please call your insurance company to determine your out of pocket expense. You may receive this vaccine at your local pharmacy or Health Dept. Shingles vaccine: Please call your insurance company to determine your out of pocket expense for the Shingrix vaccine. You may receive this vaccine at your local pharmacy.  Advanced directives:  I have provided a copy for you to complete at home and have notarized. Once this is complete please bring a copy in to our office so we can scan it into your chart.  Goals: Recommend to drink at least 6-8 8oz glasses of water per day and consume a balanced diet rich in fresh fruits and vegetables.   Next appointment: Please schedule your Annual Wellness Visit with your Nurse Health Advisor in one year.  Preventive Care 40-64 Years, Female Preventive care refers to lifestyle choices and visits with your health care provider that can promote health and wellness. What does preventive care include?  A yearly physical exam. This is also called an annual well check.  Dental exams once or twice a year.  Routine eye exams. Ask your health care provider how often you should have your eyes checked.  Personal lifestyle choices, including:  Daily care of your  teeth and gums.  Regular physical activity.  Eating a healthy diet.  Avoiding tobacco and drug use.  Limiting alcohol use.  Practicing safe sex.  Taking low-dose aspirin daily starting at age 77 if recommended by your health care provider.  Taking vitamin and mineral supplements as recommended by your health care provider. What happens during an annual well check? The services and screenings done by your health care provider during your annual well check will depend on your age, overall health, lifestyle risk factors, and family history of disease. Counseling  Your health care provider may ask you questions about your:  Alcohol use.  Tobacco use.  Drug use.  Emotional well-being.  Home and relationship well-being.  Sexual activity.  Eating habits.  Work and work Statistician.  Method of birth control.  Menstrual cycle.  Pregnancy history. Screening  You may have the following tests or measurements:  Height, weight, and BMI.  Blood pressure.  Lipid and cholesterol levels. These may be checked every 5 years, or more frequently if you are over 49 years old.  Skin check.  Lung cancer screening. You may have this screening every year starting at age 6 if you have a 30-pack-year history of smoking and currently smoke or have quit within the past 15 years.  Fecal occult blood test (FOBT) of the stool. You may have this test every year starting at age 79.  Flexible sigmoidoscopy or colonoscopy. You may have a sigmoidoscopy every 5 years or a colonoscopy every 10 years starting at age 58.  Hepatitis C blood test.  Hepatitis B blood test.  Sexually transmitted disease (STD)  testing.  Diabetes screening. This is done by checking your blood sugar (glucose) after you have not eaten for a while (fasting). You may have this done every 1-3 years.  Mammogram. This may be done every 1-2 years. Talk to your health care provider about when you should start having regular  mammograms. This may depend on whether you have a family history of breast cancer.  BRCA-related cancer screening. This may be done if you have a family history of breast, ovarian, tubal, or peritoneal cancers.  Pelvic exam and Pap test. This may be done every 3 years starting at age 27. Starting at age 89, this may be done every 5 years if you have a Pap test in combination with an HPV test.  Bone density scan. This is done to screen for osteoporosis. You may have this scan if you are at high risk for osteoporosis. Discuss your test results, treatment options, and if necessary, the need for more tests with your health care provider. Vaccines  Your health care provider may recommend certain vaccines, such as:  Influenza vaccine. This is recommended every year.  Tetanus, diphtheria, and acellular pertussis (Tdap, Td) vaccine. You may need a Td booster every 10 years.  Zoster vaccine. You may need this after age 60.  Pneumococcal 13-valent conjugate (PCV13) vaccine. You may need this if you have certain conditions and were not previously vaccinated.  Pneumococcal polysaccharide (PPSV23) vaccine. You may need one or two doses if you smoke cigarettes or if you have certain conditions. Talk to your health care provider about which screenings and vaccines you need and how often you need them. This information is not intended to replace advice given to you by your health care provider. Make sure you discuss any questions you have with your health care provider. Document Released: 12/21/2015 Document Revised: 08/13/2016 Document Reviewed: 09/25/2015 Elsevier Interactive Patient Education  2017 [place] Prevention in the Home Falls can cause injuries. They can happen to people of all ages. There are many things you can do to make your home safe and to help prevent falls. What can I do on the outside of my home?  Regularly fix the edges of walkways and driveways and fix any  cracks.  Remove anything that might make you trip as you walk through a door, such as a raised step or threshold.  Trim any bushes or trees on the path to your home.  Use bright outdoor lighting.  Clear any walking paths of anything that might make someone trip, such as rocks or tools.  Regularly check to see if handrails are loose or broken. Make sure that both sides of any steps have handrails.  Any raised decks and porches should have guardrails on the edges.  Have any leaves, snow, or ice cleared regularly.  Use sand or salt on walking paths during winter.  Clean up any spills in your garage right away. This includes oil or grease spills. What can I do in the bathroom?  Use night lights.  Install grab bars by the toilet and in the tub and shower. Do not use towel bars as grab bars.  Use non-skid mats or decals in the tub or shower.  If you need to sit down in the shower, use a plastic, non-slip stool.  Keep the floor dry. Clean up any water that spills on the floor as soon as it happens.  Remove soap buildup in the tub or shower regularly.  Attach bath  mats securely with double-sided non-slip rug tape.  Do not have throw rugs and other things on the floor that can make you trip. What can I do in the bedroom?  Use night lights.  Make sure that you have a light by your bed that is easy to reach.  Do not use any sheets or blankets that are too big for your bed. They should not hang down onto the floor.  Have a firm chair that has side arms. You can use this for support while you get dressed.  Do not have throw rugs and other things on the floor that can make you trip. What can I do in the kitchen?  Clean up any spills right away.  Avoid walking on wet floors.  Keep items that you use a lot in easy-to-reach places.  If you need to reach something above you, use a strong step stool that has a grab bar.  Keep electrical cords out of the way.  Do not use floor  polish or wax that makes floors slippery. If you must use wax, use non-skid floor wax.  Do not have throw rugs and other things on the floor that can make you trip. What can I do with my stairs?  Do not leave any items on the stairs.  Make sure that there are handrails on both sides of the stairs and use them. Fix handrails that are broken or loose. Make sure that handrails are as long as the stairways.  Check any carpeting to make sure that it is firmly attached to the stairs. Fix any carpet that is loose or worn.  Avoid having throw rugs at the top or bottom of the stairs. If you do have throw rugs, attach them to the floor with carpet tape.  Make sure that you have a light switch at the top of the stairs and the bottom of the stairs. If you do not have them, ask someone to add them for you. What else can I do to help prevent falls?  Wear shoes that:  Do not have high heels.  Have rubber bottoms.  Are comfortable and fit you well.  Are closed at the toe. Do not wear sandals.  If you use a stepladder:  Make sure that it is fully opened. Do not climb a closed stepladder.  Make sure that both sides of the stepladder are locked into place.  Ask someone to hold it for you, if possible.  Clearly mark and make sure that you can see:  Any grab bars or handrails.  First and last steps.  Where the edge of each step is.  Use tools that help you move around (mobility aids) if they are needed. These include:  Canes.  Walkers.  Scooters.  Crutches.  Turn on the lights when you go into a dark area. Replace any light bulbs as soon as they burn out.  Set up your furniture so you have a clear path. Avoid moving your furniture around.  If any of your floors are uneven, fix them.  If there are any pets around you, be aware of where they are.  Review your medicines with your doctor. Some medicines can make you feel dizzy. This can increase your chance of falling. Ask your  doctor what other things that you can do to help prevent falls. This information is not intended to replace advice given to you by your health care provider. Make sure you discuss any questions you have with your  health care provider. Document Released: 09/20/2009 Document Revised: 05/01/2016 Document Reviewed: 12/29/2014 Elsevier Interactive Patient Education  2017 [employer].

## 2019-11-18 NOTE — Patient Instructions (Signed)
Please return in 6 months for recheck  I will release your lab results to you on your MyChart account with further instructions. Please reply with any questions.    If you have any questions or concerns, please don't hesitate to send me a message via MyChart or call the office at [phone]. Thank you for visiting with [place] today! It's our pleasure caring for you.   Preventive Care 35-64 Years Old, Female Preventive care refers to visits with your health care provider and lifestyle choices that can promote health and wellness. This includes:  A yearly physical exam. This may also be called an annual well check.  Regular dental visits and eye exams.  Immunizations.  Screening for certain conditions.  Healthy lifestyle choices, such as eating a healthy diet, getting regular exercise, not using drugs or products that contain nicotine and tobacco, and limiting alcohol use. What can I expect for my preventive care visit? Physical exam Your health care provider will check your:  Height and weight. This may be used to calculate body mass index (BMI), which tells if you are at a healthy weight.  Heart rate and blood pressure.  Skin for abnormal spots. Counseling Your health care provider may ask you questions about your:  Alcohol, tobacco, and drug use.  Emotional well-being.  Home and relationship well-being.  Sexual activity.  Eating habits.  Work and work Statistician.  Method of birth control.  Menstrual cycle.  Pregnancy history. What immunizations do I need?  Influenza (flu) vaccine  This is recommended every year. Tetanus, diphtheria, and pertussis (Tdap) vaccine  You may need a Td booster every 10 years. Varicella (chickenpox) vaccine  You may need this if you have not been vaccinated. Zoster (shingles) vaccine  You may need this after age 56. Measles, mumps, and rubella (MMR) vaccine  You may need at least one dose of MMR if you were born in 1957 or  later. You may also need a second dose. Pneumococcal conjugate (PCV13) vaccine  You may need this if you have certain conditions and were not previously vaccinated. Pneumococcal polysaccharide (PPSV23) vaccine  You may need one or two doses if you smoke cigarettes or if you have certain conditions. Meningococcal conjugate (MenACWY) vaccine  You may need this if you have certain conditions. Hepatitis A vaccine  You may need this if you have certain conditions or if you travel or work in places where you may be exposed to hepatitis A. Hepatitis B vaccine  You may need this if you have certain conditions or if you travel or work in places where you may be exposed to hepatitis B. Haemophilus influenzae type b (Hib) vaccine  You may need this if you have certain conditions. Human papillomavirus (HPV) vaccine  If recommended by your health care provider, you may need three doses over 6 months. You may receive vaccines as individual doses or as more than one vaccine together in one shot (combination vaccines). Talk with your health care provider about the risks and benefits of combination vaccines. What tests do I need? Blood tests  Lipid and cholesterol levels. These may be checked every 5 years, or more frequently if you are over 42 years old.  Hepatitis C test.  Hepatitis B test. Screening  Lung cancer screening. You may have this screening every year starting at age 35 if you have a 30-pack-year history of smoking and currently smoke or have quit within the past 15 years.  Colorectal cancer screening. All adults should have  this screening starting at age 48 and continuing until age 84. Your health care provider may recommend screening at age 67 if you are at increased risk. You will have tests every 1-10 years, depending on your results and the type of screening test.  Diabetes screening. This is done by checking your blood sugar (glucose) after you have not eaten for a while  (fasting). You may have this done every 1-3 years.  Mammogram. This may be done every 1-2 years. Talk with your health care provider about when you should start having regular mammograms. This may depend on whether you have a family history of breast cancer.  BRCA-related cancer screening. This may be done if you have a family history of breast, ovarian, tubal, or peritoneal cancers.  Pelvic exam and Pap test. This may be done every 3 years starting at age 64. Starting at age 24, this may be done every 5 years if you have a Pap test in combination with an HPV test. Other tests  Sexually transmitted disease (STD) testing.  Bone density scan. This is done to screen for osteoporosis. You may have this scan if you are at high risk for osteoporosis. Follow these instructions at home: Eating and drinking  Eat a diet that includes fresh fruits and vegetables, whole grains, lean protein, and low-fat dairy.  Take vitamin and mineral supplements as recommended by your health care provider.  Do not drink alcohol if: ? Your health care provider tells you not to drink. ? You are pregnant, may be pregnant, or are planning to become pregnant.  If you drink alcohol: ? Limit how much you have to 0-1 drink a day. ? Be aware of how much alcohol is in your drink. In the [place] one drink equals one 12 oz bottle of beer (355 mL), one 5 oz glass of wine (148 mL), or one 1 oz glass of hard liquor (44 mL). Lifestyle  Take daily care of your teeth and gums.  Stay active. Exercise for at least 30 minutes on 5 or more days each week.  Do not use any products that contain nicotine or tobacco, such as cigarettes, e-cigarettes, and chewing tobacco. If you need help quitting, ask your health care provider.  If you are sexually active, practice safe sex. Use a condom or other form of birth control (contraception) in order to prevent pregnancy and STIs (sexually transmitted infections).  If told by your health  care provider, take low-dose aspirin daily starting at age 63. What's next?  Visit your health care provider once a year for a well check visit.  Ask your health care provider how often you should have your eyes and teeth checked.  Stay up to date on all vaccines. This information is not intended to replace advice given to you by your health care provider. Make sure you discuss any questions you have with your health care provider. Document Released: 12/21/2015 Document Revised: 08/05/2018 Document Reviewed: 08/05/2018 Elsevier Patient Education  2020 [employer].

## 2019-11-18 NOTE — Progress Notes (Signed)
Subjective:   Rosette Sutherlin is a 63 y.o. female who presents for an Initial Medicare Annual Wellness Visit.  Review of Systems     Cardiac Risk Factors include: smoking/ tobacco exposure;dyslipidemia    Objective:    Today's Vitals   11/18/19 1234  BP: 124/72  Temp: (!) 97.3 F (36.3 C)  TempSrc: Temporal  Weight: 123 lb 14.4 oz (56.2 kg)  Height: 5\' 7"  (1.702 m)   Body mass index is 19.41 kg/m.  Advanced Directives 11/18/2019 10/28/2018 07/28/2018 06/18/2018 12/22/2017 10/28/2014 10/26/2014  Does Patient Have a Medical Advance Directive? No No No No No No No  Would patient like information on creating a medical advance directive? Yes (MAU/Ambulatory/Procedural Areas - Information given) - No - Patient declined - No - Patient declined No - patient declined information No - patient declined information    Current Medications (verified) Outpatient Encounter Medications as of 11/18/2019  Medication Sig  . atorvastatin (LIPITOR) 10 MG tablet Take 1 tablet (10 mg total) by mouth daily.  . Glycopyrrolate-Formoterol (BEVESPI AEROSPHERE) 9-4.8 MCG/ACT AERO Inhale 2 puffs into the lungs 2 (two) times daily.  Marland Kitchen oxybutynin (DITROPAN) 5 MG tablet Take 1 tablet (5 mg total) by mouth every 8 (eight) hours as needed.  . rizatriptan (MAXALT) 10 MG tablet Take 10 mg by mouth as needed for migraine. May repeat in 2 hours if needed  . venlafaxine XR (EFFEXOR-XR) 150 MG 24 hr capsule TAKE 1 CAPSULE BY MOUTH ONCE DAILY AT BEDTIME **TAKE  WITH  75MG   FOR  225MG   TOTAL  DAILY  DOSE**  . venlafaxine XR (EFFEXOR-XR) 75 MG 24 hr capsule Take 1 capsule (75 mg total) by mouth daily. Take with 150mg  XR cap for 225mg  total daily dose   No facility-administered encounter medications on file as of 11/18/2019.    Allergies (verified) Morphine and related, Sulfa antibiotics, and Sulfacetamide sodium   History: Past Medical History:  Diagnosis Date  . Anxiety   . AR (allergic rhinitis)   .  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 Depression   . Iron deficiency anemia due to chronic blood loss 06/18/2018  . Major depression, recurrent, chronic ([place]) 08/13/2018   Managed with effexor, chronically  . 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 On statin therapy due to risk of future cardiovascular event 11/18/2019   LDL 115, ASCVD 8.4 2018: started lipitor.  . Raynaud disease 05/13/2018   feet  . Splenic rupture 10/26/14   Past Surgical History:  Procedure Laterality Date  . ABDOMINAL HYSTERECTOMY    . SPLENECTOMY, TOTAL N/A 10/26/2014   Procedure: SPLENECTOMY;  Surgeon: Georganna Skeans, MD;  Location: [facility];  Service: General;  Laterality: N/A;  . WRIST FRACTURE SURGERY Right    Family History  Problem Relation Age of Onset  . Ataxia Mother   . Stroke Mother   . Heart disease Mother   . Diabetes Mother   . Cancer Father    Social History   Socioeconomic History  . Marital status: Divorced    Spouse name: Not on file  . Number of children: Not on file  . Years of education: Not on file  . Highest education level: Not on file  Occupational History  . Occupation: disabled  Tobacco Use  . Smoking status: Light Tobacco Smoker    Types: Cigarettes  . Smokeless tobacco: Never Used  . Tobacco comment: 1 cig a day or less  Substance and Sexual Activity  . Alcohol use: Yes  Comment: socially  . Drug use: No  . Sexual activity: Never  Other Topics Concern  . Not on file  Social History Narrative  . Not on file   Social Determinants of Health   Financial Resource Strain:   . Difficulty of Paying Living Expenses: Not on file  Food Insecurity:   . Worried About Charity fundraiser in the Last Year: Not on file  . Ran Out of Food in the Last Year: Not on file  Transportation Needs:   . Lack of Transportation (Medical): Not on file  . Lack of Transportation (Non-Medical): Not on file  Physical Activity:   . Days of Exercise per Week: Not on file  . Minutes of Exercise per  Session: Not on file  Stress:   . Feeling of Stress : Not on file  Social Connections:   . Frequency of Communication with Friends and Family: Not on file  . Frequency of Social Gatherings with Friends and Family: Not on file  . Attends Religious Services: Not on file  . Active Member of Clubs or Organizations: Not on file  . Attends Archivist Meetings: Not on file  . Marital Status: Not on file    Tobacco Counseling Ready to quit: Not Answered Counseling given: Not Answered Comment: 1 cig a day or less   Clinical Intake:  Pre-visit preparation completed: Yes  Pain : No/denies pain  Diabetes: No  How often do you need to have someone help you when you read instructions, pamphlets, or other written materials from your doctor or pharmacy?: 1 - Never  Interpreter Needed?: No  Information entered by :: Denman George LPN   Activities of Daily Living In your present state of health, do you have any difficulty performing the following activities: 11/18/2019  Hearing? N  Vision? N  Difficulty concentrating or making decisions? N  Walking or climbing stairs? Y  Dressing or bathing? N  Doing errands, shopping? N  Preparing Food and eating ? N  Using the Toilet? N  In the past six months, have you accidently leaked urine? Y  Comment treated with medication  Do you have problems with loss of bowel control? N  Managing your Medications? N  Managing your Finances? N  Housekeeping or managing your Housekeeping? N  Some recent data might be hidden     Immunizations and Health Maintenance Immunization History  Administered Date(s) Administered  . DTaP / HiB 11/09/2014  . Influenza Split 11/19/2015  . Influenza, Seasonal, Injecte, Preservative Fre 10/20/2014  . Influenza,inj,Quad PF,6+ Mos 10/10/2016, 09/11/2017, 08/13/2018, 08/29/2019  . Influenza-Unspecified 09/26/2017  . Meningococcal Conjugate 11/09/2014, 01/05/2015  . Pneumococcal Conjugate-13 11/09/2014    . Pneumococcal Polysaccharide-23 05/02/2015  . Tdap 12/08/2008  . Zoster Recombinat (Shingrix) 08/31/2019   Health Maintenance Due  Topic Date Due  . Samul Dada  12/08/2018    Patient Care Team: Leamon Arnt, MD as PCP - General (Family Medicine) Leland Johns., MD as Referring Physician (Psychiatry)  Indicate any recent Medical Services you may have received from other than [facility] providers in the past year (date may be approximate).     Assessment:   This is a routine wellness examination for [place].  Hearing/Vision screen No exam data present  Dietary issues and exercise activities discussed: Current Exercise Habits: The patient does not participate in regular exercise at present, Exercise limited by: neurologic condition(s)  Goals    . LIFESTYLE - DECREASE FALLS RISK     Decrease frequent  falls       Depression Screen PHQ 2/9 Scores 11/18/2019 08/29/2019 05/13/2018 01/08/2018  PHQ - 2 Score 0 0 0 0  PHQ- 9 Score - 3 0 -  Exception Documentation (No Data) - - -    Fall Risk Fall Risk  11/18/2019 08/29/2019 05/13/2018 01/08/2018  Falls in the past year? 1 0 Yes Yes  Number falls in past yr: 1 0 2 or more 2 or more  Injury with Fall? 0 0 Yes Yes  Risk Factor Category  - - High Fall Risk High Fall Risk  Risk for fall due to : Impaired balance/gait;Impaired mobility;History of fall(s) Impaired mobility;Impaired balance/gait History of fall(s);Impaired balance/gait;Impaired mobility -  Follow up Falls evaluation completed;Education provided;Falls prevention discussed Falls evaluation completed Falls prevention discussed -  Comment - - sees [facility] due to ataxia -    Is the patient's home free of loose throw rugs in walkways, pet beds, electrical cords, etc?   yes      Grab bars in the bathroom? yes      Handrails on the stairs?   yes      Adequate lighting?   yes  Timed Get Up and Go Performed completed and within normal timeframe; no gait abnormalities noted   (utilizes the use of a rolling walker to aid in ambulation and safety)    Cognitive Function: no cognitive concerns at this time  Cognitive Testing  Alert? Yes         Normal Appearance? Yes  Oriented to person? Yes           Place? Yes  Time? Yes  Recall of three objects? Yes  Can perform simple calculations? Yes  Displays appropriate judgment? Yes  Can read the correct time from a watch face? Yes   Screening Tests Health Maintenance  Topic Date Due  . TETANUS/TDAP  12/08/2018  . MAMMOGRAM  08/08/2020  . COLONOSCOPY  03/09/2023  . INFLUENZA VACCINE  Completed  . Hepatitis C Screening  Completed  . HIV Screening  Discontinued    Qualifies for Shingles Vaccine? Discussed and patient will check with pharmacy for coverage.  Patient education handout provided    Cancer Screenings: Lung: Low Dose CT Chest recommended if Age 59-80 years, 30 pack-year currently smoking OR have quit w/in 15years. Patient does qualify. Breast: Up to date on Mammogram? Yes   Up to date of Bone Density/Dexa? Yes Colorectal: colonoscopy 03/2013  Plan:  I have personally reviewed and addressed the Medicare Annual Wellness questionnaire and have noted the following in the patient's chart:  A. Medical and social history B. Use of alcohol, tobacco or illicit drugs  C. Current medications and supplements D. Functional ability and status E.  Nutritional status F.  Physical activity G. Advance directives H. List of other physicians I.  Hospitalizations, surgeries, and ER visits in previous 12 months J.  [place] such as hearing and vision if needed, cognitive and depression L. Referrals, records requested, and appointments- none   In addition, I have reviewed and discussed with patient certain preventive protocols, quality metrics, and best practice recommendations. A written personalized care plan for preventive services as well as general preventive health recommendations were provided to  patient.   Signed,  Denman George, LPN  Nurse Health Advisor   Nurse Notes: no additional

## 2019-11-19 LAB — IRON,TIBC AND FERRITIN PANEL
%SAT: 25 % (calc) (ref 16–45)
Ferritin: 130 ng/mL (ref 16–288)
Iron: 81 ug/dL (ref 45–160)
TIBC: 318 mcg/dL (calc) (ref 250–450)

## 2019-11-20 ENCOUNTER — Encounter: Payer: Self-pay | Admitting: Family Medicine

## 2019-11-30 ENCOUNTER — Encounter: Payer: Self-pay | Admitting: Family Medicine

## 2019-12-05 ENCOUNTER — Encounter: Payer: Self-pay | Admitting: Family Medicine

## 2019-12-05 NOTE — Telephone Encounter (Signed)
Does Dr. Jonni Sanger have any open virtual visit slots for this week?

## 2019-12-05 NOTE — Telephone Encounter (Signed)
Scheduled appt for pt on Wednesday 12/30

## 2019-12-07 ENCOUNTER — Encounter: Payer: Self-pay | Admitting: Family Medicine

## 2019-12-07 ENCOUNTER — Other Ambulatory Visit: Payer: Self-pay

## 2019-12-07 ENCOUNTER — Ambulatory Visit (INDEPENDENT_AMBULATORY_CARE_PROVIDER_SITE_OTHER): Payer: Medicare Other | Admitting: Family Medicine

## 2019-12-07 VITALS — BP 112/80 | HR 69 | Temp 97.1°F | Ht 67.0 in | Wt 126.0 lb

## 2019-12-07 DIAGNOSIS — G112 Late-onset cerebellar ataxia: Secondary | ICD-10-CM | POA: Diagnosis not present

## 2019-12-07 DIAGNOSIS — S5012XA Contusion of left forearm, initial encounter: Secondary | ICD-10-CM

## 2019-12-07 NOTE — Progress Notes (Signed)
Subjective  CC:  Chief Complaint  Patient presents with  . Left arm pain    Started 3 weeks ago. Fell against the bookcase    HPI: Sherry Zimmerman is a 63 y.o. female who presents to the office today to address the problems listed above in the chief complaint.  Sherry Zimmerman fell against a bookshelf about 3 weeks ago. I've reviewed her mychart messages with photos. She reports things have greatly improved. Swelling and bruising in left arm have resolved and open wound is just about healed, however has large "lump" that has persisted. Not painful unless bumps it against something. No bone pain, normal function of arm and wrist and hand. No drainage from wound. Minimal redness now around wound. No pus, f/c/s.    Assessment  1. Traumatic hematoma of left forearm, initial encounter   2. Familial cerebellar ataxia (HCC)      Plan   hematoma:  Drained w/o complications. Wrapped and reassured. Will take weeks more to fully resolve. Discussed avoiding falls. No infection present.   Follow up:   05/23/2020  No orders of the defined types were placed in this encounter.  No orders of the defined types were placed in this encounter.     I reviewed the patients updated PMH, FH, and SocHx.    Patient Active Problem List   Diagnosis Date Noted  . On statin therapy due to risk of future cardiovascular event 11/18/2019  . 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 Migraine without aura and without status migrainosus, not intractable 01/10/2019  . Major depression, recurrent, chronic ([place]) 08/13/2018  . Acquired iron deficiency anemia due to decreased absorption 06/18/2018  . 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 Raynaud disease 05/13/2018  . 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 AR (allergic rhinitis) 01/08/2018  . OAB (overactive bladder) 11/09/2014  . 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 Tobacco dependence due to cigarettes  02/22/2014   Current Meds  Medication Sig  . atorvastatin (LIPITOR) 10 MG tablet Take 1 tablet (10 mg total) by mouth daily.  . Glycopyrrolate-Formoterol (BEVESPI AEROSPHERE) 9-4.8 MCG/ACT AERO Inhale 2 puffs into the lungs 2 (two) times daily.  Marland Kitchen oxybutynin (DITROPAN) 5 MG tablet Take 1 tablet (5 mg total) by mouth every 8 (eight) hours as needed.  . rizatriptan (MAXALT) 10 MG tablet Take 10 mg by mouth as needed for migraine. May repeat in 2 hours if needed  . venlafaxine XR (EFFEXOR-XR) 150 MG 24 hr capsule TAKE 1 CAPSULE BY MOUTH ONCE DAILY AT BEDTIME **TAKE  WITH  75MG   FOR  225MG   TOTAL  DAILY  DOSE**  . venlafaxine XR (EFFEXOR-XR) 75 MG 24 hr capsule Take 1 capsule (75 mg total) by mouth daily. Take with 150mg  XR cap for 225mg  total daily dose    Allergies: Patient is allergic to morphine and related; sulfa antibiotics; and sulfacetamide sodium. Family History: Patient family history includes Ataxia in her mother; Cancer in her father; Diabetes in her mother; Heart disease in her mother; Stroke in her mother. Social History:  Patient  reports that she has been smoking cigarettes. She has never used smokeless tobacco. She reports current alcohol use. She reports that she does not use drugs.  Review of Systems: Constitutional: Negative for fever malaise or anorexia Cardiovascular: negative for chest pain Respiratory: negative for SOB or persistent cough Gastrointestinal: negative for abdominal pain  Objective  Vitals: BP 112/80 (BP Location: Right Arm, Patient Position: Sitting,  Cuff Size: Normal)   Pulse 69   Temp (!) 97.1 F (36.2 C) (Temporal)   Ht 5\' 7"  (1.702 m)   Wt 126 lb (57.2 kg)   SpO2 98%   BMI 19.73 kg/m  General: no acute distress , A&Ox3 Skin:  Warm, no rashes, left mid forearm with approx 3.5cm raised minimally tender nodule with central healing wound, clean granulation tissue. No pus or warmth  Aspiration/Injection Procedure Note Sherry Zimmerman [id number]  May 05, 1956  Procedure: Aspiration Indications: drain hematoma, r/o infection  Procedure Details Consent: Risks of procedure as well as the alternatives and risks of each were explained to the (patient/caregiver).  Consent for procedure obtained. Time Out: Verified patient identification, verified procedure, site/side was marked, verified correct patient position, special equipment/implants available, medications/allergies/relevent history reviewed, required imaging and test results available.  Performed   Local Anesthesia Used:Ethyl Chloride Spray Amount of Fluid Aspirated: 78mL of old blood Character of Fluid: bloody A sterile pressure dressing was applied.  Patient did tolerate procedure well. Estimated blood loss: none  Leamon Arnt 12/07/2019, 12:00 PM    Commons side effects, risks, benefits, and alternatives for medications and treatment plan prescribed today were discussed, and the patient expressed understanding of the given instructions. Patient is instructed to call or message via MyChart if he/she has any questions or concerns regarding our treatment plan. No barriers to understanding were identified. We discussed Red Flag symptoms and signs in detail. Patient expressed understanding regarding what to do in case of urgent or emergency type symptoms.   Medication list was reconciled, printed and provided to the patient in AVS. Patient instructions and summary information was reviewed with the patient as documented in the AVS. This note was prepared with assistance of Dragon voice recognition software. Occasional wrong-word or sound-a-like substitutions may have occurred due to the inherent limitations of voice recognition software  This visit occurred during the SARS-CoV-2 public health emergency.  Safety protocols were in place, including screening questions prior to the visit, additional usage of staff PPE, and extensive cleaning of exam room while observing appropriate  contact time as indicated for disinfecting solutions.

## 2020-01-03 ENCOUNTER — Encounter: Payer: Self-pay | Admitting: Family Medicine

## 2020-01-04 DIAGNOSIS — H5051 Esophoria: Secondary | ICD-10-CM | POA: Diagnosis not present

## 2020-01-04 DIAGNOSIS — H02831 Dermatochalasis of right upper eyelid: Secondary | ICD-10-CM | POA: Diagnosis not present

## 2020-01-04 DIAGNOSIS — H18513 Endothelial corneal dystrophy, bilateral: Secondary | ICD-10-CM | POA: Diagnosis not present

## 2020-01-04 DIAGNOSIS — H52203 Unspecified astigmatism, bilateral: Secondary | ICD-10-CM | POA: Diagnosis not present

## 2020-01-04 DIAGNOSIS — H25813 Combined forms of age-related cataract, bilateral: Secondary | ICD-10-CM | POA: Diagnosis not present

## 2020-01-07 DIAGNOSIS — H25813 Combined forms of age-related cataract, bilateral: Secondary | ICD-10-CM | POA: Diagnosis not present

## 2020-01-07 DIAGNOSIS — Z01812 Encounter for preprocedural laboratory examination: Secondary | ICD-10-CM | POA: Diagnosis not present

## 2020-01-07 DIAGNOSIS — Z01818 Encounter for other preprocedural examination: Secondary | ICD-10-CM | POA: Diagnosis not present

## 2020-01-10 DIAGNOSIS — H25813 Combined forms of age-related cataract, bilateral: Secondary | ICD-10-CM | POA: Diagnosis not present

## 2020-01-10 DIAGNOSIS — Z79899 Other long term (current) drug therapy: Secondary | ICD-10-CM | POA: Diagnosis not present

## 2020-01-10 DIAGNOSIS — Z882 Allergy status to sulfonamides status: Secondary | ICD-10-CM | POA: Diagnosis not present

## 2020-01-10 DIAGNOSIS — Z885 Allergy status to narcotic agent status: Secondary | ICD-10-CM | POA: Diagnosis not present

## 2020-01-10 DIAGNOSIS — Z83511 Family history of glaucoma: Secondary | ICD-10-CM | POA: Diagnosis not present

## 2020-01-10 DIAGNOSIS — G43009 Migraine without aura, not intractable, without status migrainosus: Secondary | ICD-10-CM | POA: Diagnosis not present

## 2020-01-10 DIAGNOSIS — H25811 Combined forms of age-related cataract, right eye: Secondary | ICD-10-CM | POA: Diagnosis not present

## 2020-01-11 ENCOUNTER — Encounter: Payer: Self-pay | Admitting: Family Medicine

## 2020-01-11 DIAGNOSIS — H18513 Endothelial corneal dystrophy, bilateral: Secondary | ICD-10-CM | POA: Diagnosis not present

## 2020-01-11 DIAGNOSIS — H5051 Esophoria: Secondary | ICD-10-CM | POA: Diagnosis not present

## 2020-01-11 DIAGNOSIS — H52203 Unspecified astigmatism, bilateral: Secondary | ICD-10-CM | POA: Diagnosis not present

## 2020-01-11 DIAGNOSIS — H25812 Combined forms of age-related cataract, left eye: Secondary | ICD-10-CM | POA: Diagnosis not present

## 2020-01-11 DIAGNOSIS — Z961 Presence of intraocular lens: Secondary | ICD-10-CM | POA: Diagnosis not present

## 2020-01-12 ENCOUNTER — Encounter: Payer: Self-pay | Admitting: Family Medicine

## 2020-01-14 DIAGNOSIS — H2589 Other age-related cataract: Secondary | ICD-10-CM | POA: Diagnosis not present

## 2020-01-14 DIAGNOSIS — Z01818 Encounter for other preprocedural examination: Secondary | ICD-10-CM | POA: Diagnosis not present

## 2020-01-14 DIAGNOSIS — Z01812 Encounter for preprocedural laboratory examination: Secondary | ICD-10-CM | POA: Diagnosis not present

## 2020-01-17 DIAGNOSIS — G111 Early-onset cerebellar ataxia, unspecified: Secondary | ICD-10-CM | POA: Diagnosis not present

## 2020-01-17 DIAGNOSIS — H2512 Age-related nuclear cataract, left eye: Secondary | ICD-10-CM | POA: Diagnosis not present

## 2020-01-17 DIAGNOSIS — Z885 Allergy status to narcotic agent status: Secondary | ICD-10-CM | POA: Diagnosis not present

## 2020-01-17 DIAGNOSIS — H2181 Floppy iris syndrome: Secondary | ICD-10-CM | POA: Diagnosis not present

## 2020-01-17 DIAGNOSIS — H25812 Combined forms of age-related cataract, left eye: Secondary | ICD-10-CM | POA: Diagnosis not present

## 2020-01-17 DIAGNOSIS — Z79899 Other long term (current) drug therapy: Secondary | ICD-10-CM | POA: Diagnosis not present

## 2020-01-17 DIAGNOSIS — H25813 Combined forms of age-related cataract, bilateral: Secondary | ICD-10-CM | POA: Diagnosis not present

## 2020-01-17 DIAGNOSIS — Z9081 Acquired absence of spleen: Secondary | ICD-10-CM | POA: Diagnosis not present

## 2020-01-17 DIAGNOSIS — G43009 Migraine without aura, not intractable, without status migrainosus: Secondary | ICD-10-CM | POA: Diagnosis not present

## 2020-01-17 DIAGNOSIS — Z880 Allergy status to penicillin: Secondary | ICD-10-CM | POA: Diagnosis not present

## 2020-01-17 DIAGNOSIS — E785 Hyperlipidemia, unspecified: Secondary | ICD-10-CM | POA: Diagnosis not present

## 2020-01-17 HISTORY — PX: CATARACT EXTRACTION: SUR2

## 2020-01-18 DIAGNOSIS — H5051 Esophoria: Secondary | ICD-10-CM | POA: Diagnosis not present

## 2020-01-18 DIAGNOSIS — H02831 Dermatochalasis of right upper eyelid: Secondary | ICD-10-CM | POA: Diagnosis not present

## 2020-01-18 DIAGNOSIS — H52203 Unspecified astigmatism, bilateral: Secondary | ICD-10-CM | POA: Diagnosis not present

## 2020-01-18 DIAGNOSIS — H18513 Endothelial corneal dystrophy, bilateral: Secondary | ICD-10-CM | POA: Diagnosis not present

## 2020-01-18 DIAGNOSIS — Z961 Presence of intraocular lens: Secondary | ICD-10-CM | POA: Diagnosis not present

## 2020-03-02 ENCOUNTER — Other Ambulatory Visit: Payer: Self-pay | Admitting: Family Medicine

## 2020-03-02 NOTE — Telephone Encounter (Signed)
LAST APPOINTMENT DATE: 12/07/2019  NEXT APPOINTMENT DATE: 05/23/2020   LAST REFILL: 02/09/2019  QTY: 90 CAPSULE EA

## 2020-03-06 ENCOUNTER — Encounter: Payer: Self-pay | Admitting: Family Medicine

## 2020-05-23 ENCOUNTER — Ambulatory Visit (INDEPENDENT_AMBULATORY_CARE_PROVIDER_SITE_OTHER): Payer: Medicare Other | Admitting: Family Medicine

## 2020-05-23 ENCOUNTER — Other Ambulatory Visit: Payer: Self-pay

## 2020-05-23 ENCOUNTER — Encounter: Payer: Self-pay | Admitting: Family Medicine

## 2020-05-23 VITALS — BP 130/78 | HR 105 | Temp 98.5°F | Resp 14 | Ht 67.0 in | Wt 123.0 lb

## 2020-05-23 DIAGNOSIS — G112 Late-onset cerebellar ataxia: Secondary | ICD-10-CM

## 2020-05-23 DIAGNOSIS — F17210 Nicotine dependence, cigarettes, uncomplicated: Secondary | ICD-10-CM

## 2020-05-23 DIAGNOSIS — F339 Major depressive disorder, recurrent, unspecified: Secondary | ICD-10-CM | POA: Diagnosis not present

## 2020-05-23 DIAGNOSIS — J449 Chronic obstructive pulmonary disease, unspecified: Secondary | ICD-10-CM

## 2020-05-23 DIAGNOSIS — K909 Intestinal malabsorption, unspecified: Secondary | ICD-10-CM | POA: Diagnosis not present

## 2020-05-23 DIAGNOSIS — Z9081 Acquired absence of spleen: Secondary | ICD-10-CM

## 2020-05-23 DIAGNOSIS — D508 Other iron deficiency anemias: Secondary | ICD-10-CM

## 2020-05-23 DIAGNOSIS — E2839 Other primary ovarian failure: Secondary | ICD-10-CM

## 2020-05-23 DIAGNOSIS — Z79899 Other long term (current) drug therapy: Secondary | ICD-10-CM

## 2020-05-23 DIAGNOSIS — Z23 Encounter for immunization: Secondary | ICD-10-CM

## 2020-05-23 DIAGNOSIS — Z1231 Encounter for screening mammogram for malignant neoplasm of breast: Secondary | ICD-10-CM

## 2020-05-23 DIAGNOSIS — R296 Repeated falls: Secondary | ICD-10-CM

## 2020-05-23 DIAGNOSIS — M7581 Other shoulder lesions, right shoulder: Secondary | ICD-10-CM

## 2020-05-23 DIAGNOSIS — D75838 Other thrombocytosis: Secondary | ICD-10-CM

## 2020-05-23 DIAGNOSIS — D473 Essential (hemorrhagic) thrombocythemia: Secondary | ICD-10-CM

## 2020-05-23 MED ORDER — DICLOFENAC SODIUM 75 MG PO TBEC: 75.0000 mg | DELAYED_RELEASE_TABLET | Freq: Two times a day (BID) | ORAL | 0 refills | Status: DC

## 2020-05-23 NOTE — Progress Notes (Signed)
Subjective  CC:  Chief Complaint  Patient presents with  . Depression  . Arm Pain    right arm pain when lifting x 2 weeks.   . Anemia  . COPD    HPI: Sherry Zimmerman is a 64 y.o. female who presents to the office today to address the problems listed above in the chief complaint.   Chronic problem f/u: IDA w/o sxs of anemia.   Depression is stable on meds  Frequent falls to due ataxia.   Mood is good! Controlled on meds  Tolerates statin    smoker: mild increase in use to about 4 cigs/day. precontemplative about further cessation efforts  HM: due for mammo and first dexa.   Right deltoid/shoulder pain with abduction or if trying to reach forward and lift something. No injury. Walks with walker. Left handed. No neck pain or radicular sxs. No weakness. Takes tylenol. Mild relief. worsening  Assessment  1. Acquired iron deficiency anemia due to decreased absorption   2. COPD with chronic bronchitis ([place])   3. Familial cerebellar ataxia ([place])   4. Major depression, recurrent, chronic ([place])   5. Malabsorption of iron   6. On statin therapy due to risk of future cardiovascular event   7. 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 Tobacco dependence due to cigarettes   9. Encounter for screening mammogram for breast cancer   10. Hypoestrogenism   11. Frequent falls   12. Right rotator cuff tendonitis   13. Need for pneumococcal vaccination      Plan   Check labs for anemia/iron:  Clinically stable  Smoker with mkld copd stable - candidate for lung cancer screen. Updated pneumovax booster  Ataxia and frequent falls. Again stressed fall prevention.   Screen for osteoporosis: high risk due to smoker and frequent falls  Mood is stable  RCT: rice, rom and nsaids x 2 weeks. F/u for xray and further eval if not improving. Could be related to strain/overuse/OA due to needing to use walker to ambulate.  Follow up: 6 months for cpe  Visit date not found  Orders  Placed This Encounter  Procedures  . DG Bone Density  . MM DIGITAL SCREENING BILATERAL  . Pneumococcal polysaccharide vaccine 23-valent greater than or equal to 2yo subcutaneous/IM  . Ambulatory Referral for Lung Cancer Scre   Meds ordered this encounter  Medications  . diclofenac (VOLTAREN) 75 MG EC tablet    Sig: Take 1 tablet (75 mg total) by mouth 2 (two) times daily.    Dispense:  30 tablet    Refill:  0      I reviewed the patients updated PMH, FH, and SocHx.    Patient Active Problem List   Diagnosis Date Noted  . On statin therapy due to risk of future cardiovascular event 11/18/2019  . 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 Migraine without aura and without status migrainosus, not intractable 01/10/2019  . Major depression, recurrent, chronic ([place]) 08/13/2018  . Acquired iron deficiency anemia due to decreased absorption 06/18/2018  . 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 Raynaud disease 05/13/2018  . 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 AR (allergic rhinitis) 01/08/2018  . OAB (overactive bladder) 11/09/2014  . 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 Tobacco dependence due to cigarettes 02/22/2014   Current Meds  Medication Sig  . atorvastatin (LIPITOR) 10 MG tablet Take 1 tablet (10 mg total) by mouth daily.  . Glycopyrrolate-Formoterol (BEVESPI AEROSPHERE)  9-4.8 MCG/ACT AERO Inhale 2 puffs into the lungs 2 (two) times daily.  . Multiple Vitamins-Minerals (CENTRUM SILVER PO) Take by mouth.  . oxybutynin (DITROPAN) 5 MG tablet Take 1 tablet (5 mg total) by mouth every 8 (eight) hours as needed.  . rizatriptan (MAXALT) 10 MG tablet Take 10 mg by mouth as needed for migraine. May repeat in 2 hours if needed  . venlafaxine XR (EFFEXOR-XR) 150 MG 24 hr capsule TAKE 1 CAPSULE BY MOUTH AT BEDTIME **TAKE  WITH  75MG   FOR  225MG   TOTAL  DAILY  DOSE**  . venlafaxine XR (EFFEXOR-XR) 75 MG 24 hr capsule TAKE 1  CAPSULE BY MOUTH ONCE DAILY TAKE  WITH  150  MG  XR  CAPS  FOR  225  MG  TOTAL  DAILY  DOSE    Allergies: Patient is allergic to morphine and related, sulfa antibiotics, and sulfacetamide sodium. Family History: Patient family history includes Ataxia in her mother; Cancer in her father; Diabetes in her mother; Heart disease in her mother; Stroke in her mother. Social History:  Patient  reports that she has been smoking cigarettes. She has never used smokeless tobacco. She reports current alcohol use. She reports that she does not use drugs.  Review of Systems: Constitutional: Negative for fever malaise or anorexia Cardiovascular: negative for chest pain Respiratory: negative for SOB or persistent cough Gastrointestinal: negative for abdominal pain  Objective  Vitals: BP 130/78   Pulse (!) 105   Temp 98.5 F (36.9 C) (Temporal)   Resp 14   Ht 5\' 7"  (1.702 m)   Wt 123 lb (55.8 kg)   SpO2 95%   BMI 19.26 kg/m  General: no acute distress , A&Ox3 HEENT: PEERL, conjunctiva normal, neck is supple Cardiovascular:  RRR without murmur or gallop.  Respiratory:  Good breath sounds bilaterally, CTAB with normal respiratory effort Skin:  Warm, no rashes Right shoulder: nontender. No deformity: FROM but pain with abduction > 90 degrees. + empty can. Mild impingement and poor int rotation w/ abduction.      Commons side effects, risks, benefits, and alternatives for medications and treatment plan prescribed today were discussed, and the patient expressed understanding of the given instructions. Patient is instructed to call or message via MyChart if he/she has any questions or concerns regarding our treatment plan. No barriers to understanding were identified. We discussed Red Flag symptoms and signs in detail. Patient expressed understanding regarding what to do in case of urgent or emergency type symptoms.   Medication list was reconciled, printed and provided to the patient in AVS. Patient  instructions and summary information was reviewed with the patient as documented in the AVS. This note was prepared with assistance of Dragon voice recognition software. Occasional wrong-word or sound-a-like substitutions may have occurred due to the inherent limitations of voice recognition software  This visit occurred during the SARS-CoV-2 public health emergency.  Safety protocols were in place, including screening questions prior to the visit, additional usage of staff PPE, and extensive cleaning of exam room while observing appropriate contact time as indicated for disinfecting solutions.

## 2020-05-23 NOTE — Patient Instructions (Signed)
Please return in 6 months for your annual complete physical; please come fasting.  Start icing your shoulder for 10 minutes 3x/day. Take the antiinflammatory medication twice a day with food for two weeks. Do the exercises and return if it is not improving.   If you have any questions or concerns, please don't hesitate to send me a message via MyChart or call the office at [phone]. Thank you for visiting with [place] today! It's our pleasure caring for you.   Shoulder Range of Motion Exercises Shoulder range of motion (ROM) exercises are done to keep the shoulder moving freely or to increase movement. They are often recommended for people who have shoulder pain or stiffness or who are recovering from a shoulder surgery. Phase 1 exercises When you are able, do this exercise 1-2 times per day for 30-60 seconds in each direction, or as directed by your health care provider. Pendulum exercise To do this exercise while sitting: 1. Sit in a chair or at the edge of your bed with your feet flat on the floor. 2. Let your affected arm hang down in front of you over the edge of the bed or chair. 3. Relax your shoulder, arm, and hand. McGraw your body so your arm gently swings in small circles. You can also use your unaffected arm to start the motion. 5. Repeat changing the direction of the circles, swinging your arm left and right, and swinging your arm forward and back. To do this exercise while standing: 1. Stand next to a sturdy chair or table, and hold on to it with your hand on your unaffected side. 2. Bend forward at the waist. 3. Bend your knees slightly. 4. Relax your shoulder, arm, and hand. 5. While keeping your shoulder relaxed, use body motion to swing your arm in small circles. 6. Repeat changing the direction of the circles, swinging your arm left and right, and swinging your arm forward and back. 7. Between exercises, stand up tall and take a short break to relax your lower  back.  Phase 2 exercises Do these exercises 1-2 times per day or as told by your health care provider. Hold each stretch for 30 seconds, and repeat 3 times. Do the exercises with one or both arms as instructed by your health care provider. For these exercises, sit at a table with your hand and arm supported by the table. A chair that slides easily or has wheels can be helpful. External rotation 1. Turn your chair so that your affected side is nearest to the table. 2. Place your forearm on the table to your side. Bend your elbow about 90 at the elbow (right angle) and place your hand palm facing down on the table. Your elbow should be about 6 inches away from your side. 3. Keeping your arm on the table, lean your body forward. Abduction 1. Turn your chair so that your affected side is nearest to the table. 2. Place your forearm and hand on the table so that your thumb points toward the ceiling and your arm is straight out to your side. 3. Slide your hand out to the side and away from you, using your unaffected arm to do the work. 4. To increase the stretch, you can slide your chair away from the table. Flexion: forward stretch 1. Sit facing the table. Place your hand and elbow on the table in front of you. 2. Slide your hand forward and away from you, using your unaffected arm to do  the work. 3. To increase the stretch, you can slide your chair backward. Phase 3 exercises Do these exercises 1-2 times per day or as told by your health care provider. Hold each stretch for 30 seconds, and repeat 3 times. Do the exercises with one or both arms as instructed by your health care provider. Cross-body stretch: posterior capsule stretch 1. Lift your arm straight out in front of you. 2. Bend your arm 90 at the elbow (right angle) so your forearm moves across your body. 3. Use your other arm to gently pull the elbow across your body, toward your other shoulder. Wall climbs 1. Stand with your affected  arm extended out to the side with your hand resting on a door frame. 2. Slide your hand slowly up the door frame. 3. To increase the stretch, step through the door frame. Keep your body upright and do not lean. Wand exercises You will need a cane, a piece of PVC pipe, or a sturdy wooden dowel for wand exercises. Flexion To do this exercise while standing: 1. Hold the wand with both of your hands, palms down. 2. Using the other arm to help, lift your arms up and over your head, if able. 3. Push upward with your other arm to gently increase the stretch. To do this exercise while lying down: 1. Lie on your back with your elbows resting on the floor and the wand in both your hands. Your hands will be palm down, or pointing toward your feet. 2. Lift your hands toward the ceiling, using your unaffected arm to help if needed. 3. Bring your arms overhead as able, using your unaffected arm to help if needed. Internal rotation 1. Stand while holding the wand behind you with both hands. Your unaffected arm should be extended above your head with the arm of the affected side extended behind you at the level of your waist. The wand should be pointing straight up and down as you hold it. 2. Slowly pull the wand up behind your back by straightening the elbow of your unaffected arm and bending the elbow of your affected arm. External rotation 1. Lie on your back with your affected upper arm supported on a small pillow or rolled towel. When you first do this exercise, keep your upper arm close to your body. Over time, bring your arm up to a 90 angle out to the side. 2. Hold the wand across your stomach and with both hands palm up. Your elbow on your affected side should be bent at a 90 angle. 3. Use your unaffected side to help push your forearm away from you and toward the floor. Keep your elbow on your affected side bent at a 90 angle. Contact a health care provider if you have:  New or increasing  pain.  New numbness, tingling, weakness, or discoloration in your arm or hand. This information is not intended to replace advice given to you by your health care provider. Make sure you discuss any questions you have with your health care provider. Document Revised: 01/06/2018 Document Reviewed: 01/06/2018 Elsevier Patient Education  2020 [employer].

## 2020-05-24 ENCOUNTER — Other Ambulatory Visit: Payer: Self-pay

## 2020-05-24 ENCOUNTER — Encounter: Payer: Self-pay | Admitting: Family Medicine

## 2020-05-24 MED ORDER — OXYBUTYNIN CHLORIDE 5 MG PO TABS: 5.0000 mg | ORAL_TABLET | Freq: Three times a day (TID) | ORAL | 3 refills | Status: DC | PRN

## 2020-05-24 MED ORDER — VENLAFAXINE HCL ER 150 MG PO CP24: ORAL_CAPSULE | ORAL | 3 refills | Status: DC

## 2020-05-24 MED ORDER — VENLAFAXINE HCL ER 75 MG PO CP24: ORAL_CAPSULE | ORAL | 0 refills | Status: DC

## 2020-05-24 MED ORDER — ATORVASTATIN CALCIUM 10 MG PO TABS: 10.0000 mg | ORAL_TABLET | Freq: Every day | ORAL | 2 refills | Status: DC

## 2020-05-25 ENCOUNTER — Other Ambulatory Visit: Payer: Self-pay | Admitting: *Deleted

## 2020-05-25 ENCOUNTER — Other Ambulatory Visit: Payer: Self-pay

## 2020-05-25 DIAGNOSIS — Z87891 Personal history of nicotine dependence: Secondary | ICD-10-CM

## 2020-05-25 DIAGNOSIS — F17210 Nicotine dependence, cigarettes, uncomplicated: Secondary | ICD-10-CM

## 2020-05-25 MED ORDER — VENLAFAXINE HCL ER 75 MG PO CP24: ORAL_CAPSULE | ORAL | 3 refills | Status: DC

## 2020-06-04 NOTE — Telephone Encounter (Signed)
Yes, it's in [place] on [address]. Please look it up and change referral or call. thanks

## 2020-06-13 ENCOUNTER — Other Ambulatory Visit: Payer: Self-pay

## 2020-06-13 DIAGNOSIS — Z1239 Encounter for other screening for malignant neoplasm of breast: Secondary | ICD-10-CM

## 2020-06-13 DIAGNOSIS — Z13820 Encounter for screening for osteoporosis: Secondary | ICD-10-CM

## 2020-06-13 NOTE — Telephone Encounter (Signed)
Orders placed.

## 2020-06-15 ENCOUNTER — Telehealth: Payer: Self-pay | Admitting: Family Medicine

## 2020-06-15 ENCOUNTER — Encounter: Payer: Self-pay | Admitting: Family Medicine

## 2020-06-15 NOTE — Telephone Encounter (Signed)
Patient called and stated she would like to be worked in Monday or Tuesday for an appointment regarding her shoulder, could I schedule her on one of the same day appointments Monday or Tuesday  Please advise?

## 2020-06-18 NOTE — Telephone Encounter (Signed)
LMOVM to return call for appt

## 2020-06-18 NOTE — Telephone Encounter (Signed)
Patient is schedule to see Dr. Jonni Sanger Wednesday( 06-20-2020).

## 2020-07-23 ENCOUNTER — Encounter: Payer: Medicare Other | Admitting: Acute Care

## 2020-07-23 ENCOUNTER — Inpatient Hospital Stay: Admission: RE | Admit: 2020-07-23 | Payer: Medicare Other | Source: Ambulatory Visit

## 2020-07-25 ENCOUNTER — Ambulatory Visit: Payer: Medicare Other | Admitting: Family Medicine

## 2020-07-30 ENCOUNTER — Encounter: Payer: Medicare Other | Admitting: Acute Care

## 2020-07-30 ENCOUNTER — Inpatient Hospital Stay: Admission: RE | Admit: 2020-07-30 | Payer: Medicare Other | Source: Ambulatory Visit

## 2020-08-06 ENCOUNTER — Ambulatory Visit (INDEPENDENT_AMBULATORY_CARE_PROVIDER_SITE_OTHER): Payer: Medicare Other | Admitting: Acute Care

## 2020-08-06 ENCOUNTER — Other Ambulatory Visit: Payer: Self-pay

## 2020-08-06 ENCOUNTER — Encounter: Payer: Self-pay | Admitting: Acute Care

## 2020-08-06 ENCOUNTER — Ambulatory Visit (INDEPENDENT_AMBULATORY_CARE_PROVIDER_SITE_OTHER)
Admission: RE | Admit: 2020-08-06 | Discharge: 2020-08-06 | Disposition: A | Discharge status: Home / Self Care | Payer: Medicare Other | Source: Ambulatory Visit | Attending: Acute Care | Admitting: Acute Care

## 2020-08-06 VITALS — BP 130/70 | HR 85 | Temp 97.3°F | Ht 68.0 in | Wt 121.0 lb

## 2020-08-06 DIAGNOSIS — F17210 Nicotine dependence, cigarettes, uncomplicated: Secondary | ICD-10-CM

## 2020-08-06 DIAGNOSIS — Z87891 Personal history of nicotine dependence: Secondary | ICD-10-CM

## 2020-08-06 DIAGNOSIS — Z122 Encounter for screening for malignant neoplasm of respiratory organs: Secondary | ICD-10-CM

## 2020-08-06 NOTE — Progress Notes (Signed)
Shared Decision Making Visit Lung Cancer Screening Program [id number])   Eligibility:  Age 64 y.o.  Pack Years Smoking History Calculation 50 pack year smoking history (# packs/per year x # years smoked)  Recent History of coughing up blood  no  Unexplained weight loss? no ( >Than 15 pounds within the last 6 months )  Prior History Lung / other cancer no (Diagnosis within the last 5 years already requiring surveillance chest CT Scans).  Smoking Status Current Smoker  Former Smokers: Years since quit: NA  Quit Date: NA  Visit Components:  Discussion included one or more decision making aids. yes  Discussion included risk/benefits of screening. yes  Discussion included potential follow up diagnostic testing for abnormal scans. yes  Discussion included meaning and risk of over diagnosis. yes  Discussion included meaning and risk of False Positives. yes  Discussion included meaning of total radiation exposure. yes  Counseling Included:  Importance of adherence to annual lung cancer LDCT screening. yes  Impact of comorbidities on ability to participate in the program. yes  Ability and willingness to under diagnostic treatment. yes  Smoking Cessation Counseling:  Current Smokers:   Discussed importance of smoking cessation. yes  Information about tobacco cessation classes and interventions provided to patient. yes  Patient provided with "ticket" for LDCT Scan. yes  Symptomatic Patient. no  Counseling  Diagnosis Code: Tobacco Use Z72.0  Asymptomatic Patient yes  Counseling (Intermediate counseling: > three minutes counseling) V6160  Former Smokers:   Discussed the importance of maintaining cigarette abstinence. yes  Diagnosis Code: Personal History of Nicotine Dependence. V37.106  Information about tobacco cessation classes and interventions provided to patient. Yes  Patient provided with "ticket" for LDCT Scan. yes  Written Order for Lung Cancer  Screening with LDCT placed in Epic. Yes (CT Chest Lung Cancer Screening Low Dose W/O CM) [id number] Z12.2-Screening of respiratory organs Z87.891-Personal history of nicotine dependence  BP 130/70 (BP Location: Left Arm, Cuff Size: Normal)   Pulse 85   Temp (!) 97.3 F (36.3 C) (Oral)   Ht 5\' 8"  (1.727 m)   Wt 121 lb (54.9 kg)   SpO2 98%   BMI 18.40 kg/m    I have spent 25 minutes of face to face time with Sherry Zimmerman discussing the risks and benefits of lung cancer screening. We viewed a power point together that explained in detail the above noted topics. We paused at intervals to allow for questions to be asked and answered to ensure understanding.We discussed that the single most powerful action that she can take to decrease her risk of developing lung cancer is to quit smoking. We discussed whether or not she is ready to commit to setting a quit date. We discussed options for tools to aid in quitting smoking including nicotine replacement therapy, non-nicotine medications, support groups, Quit Smart classes, and behavior modification. We discussed that often times setting smaller, more achievable goals, such as eliminating 1 cigarette a day for a week and then 2 cigarettes a day for a week can be helpful in slowly decreasing the number of cigarettes smoked. This allows for a sense of accomplishment as well as providing a clinical benefit. I gave her the " Be Stronger Than Your Excuses" card with contact information for community resources, classes, free nicotine replacement therapy, and access to mobile apps, text messaging, and on-line smoking cessation help. I have also given her my card and contact information in the event she needs to contact me. We discussed the time  and location of the scan, and that either Doroteo Glassman RN or I will call with the results within 24-48 hours of receiving them. I have offered her  a copy of the power point we viewed  as a resource in the event they need  reinforcement of the concepts we discussed today in the office. The patient verbalized understanding of all of  the above and had no further questions upon leaving the office. They have my contact information in the event they have any further questions.  I spent 4 minutes counseling on smoking cessation and the health risks of continued tobacco abuse.  I explained to the patient that there has been a high incidence of coronary artery disease noted on these exams. I explained that this is a non-gated exam therefore degree or severity cannot be determined. This patient is on statin therapy. I have asked the patient to follow-up with their PCP regarding any incidental finding of coronary artery disease and management with diet or medication as their PCP  feels is clinically indicated. The patient verbalized understanding of the above and had no further questions upon completion of the visit.      Magdalen Spatz, NP 08/06/2020

## 2020-08-06 NOTE — Patient Instructions (Signed)
Thank you for participating in the [facility]. It was our pleasure to meet you today. We will call you with the results of your scan within the next few days. Your scan will be assigned a Lung RADS category score by the physicians reading the scans.  This Lung RADS score determines follow up scanning.  See below for description of categories, and follow up screening recommendations. We will be in touch to schedule your follow up screening annually or based on recommendations of our providers. We will fax a copy of your scan results to your Primary Care Physician, or the physician who referred you to the program, to ensure they have the results. Please call the office if you have any questions or concerns regarding your scanning experience or results.  Our office number is [phone]. Please speak with Denise Phelps, RN. She is our Lung Cancer Screening RN. If she is unavailable when you call, please have the office staff send her a message. She will return your call at her earliest convenience. Remember, if your scan is normal, we will scan you annually as long as you continue to meet the criteria for the program. (Age 55-77, Current smoker or smoker who has quit within the last 15 years). If you are a smoker, remember, quitting is the single most powerful action that you can take to decrease your risk of lung cancer and other pulmonary, breathing related problems. We know quitting is hard, and we are here to help.  Please let us know if there is anything we can do to help you meet your goal of quitting. If you are a former smoker, congratulations. We are proud of you! Remain smoke free! Remember you can refer friends or family members through the number above.  We will screen them to make sure they meet criteria for the program. Thank you for helping us take better care of you by participating in Lung Screening.  Lung RADS Categories:  Lung RADS 1: no nodules  or definitely non-concerning nodules.  Recommendation is for a repeat annual scan in 12 months.  Lung RADS 2:  nodules that are non-concerning in appearance and behavior with a very low likelihood of becoming an active cancer. Recommendation is for a repeat annual scan in 12 months.  Lung RADS 3: nodules that are probably non-concerning , includes nodules with a low likelihood of becoming an active cancer.  Recommendation is for a 6-month repeat screening scan. Often noted after an upper respiratory illness. We will be in touch to make sure you have no questions, and to schedule your 6-month scan.  Lung RADS 4 A: nodules with concerning findings, recommendation is most often for a follow up scan in 3 months or additional testing based on our provider's assessment of the scan. We will be in touch to make sure you have no questions and to schedule the recommended 3 month follow up scan.  Lung RADS 4 B:  indicates findings that are concerning. We will be in touch with you to schedule additional diagnostic testing based on our provider's  assessment of the scan.   

## 2020-08-14 ENCOUNTER — Other Ambulatory Visit: Payer: Self-pay | Admitting: *Deleted

## 2020-08-14 DIAGNOSIS — Z87891 Personal history of nicotine dependence: Secondary | ICD-10-CM

## 2020-08-14 DIAGNOSIS — F17210 Nicotine dependence, cigarettes, uncomplicated: Secondary | ICD-10-CM

## 2020-08-14 NOTE — Progress Notes (Signed)
Please call patient and let them  know their  low dose Ct was read as a Lung RADS 2: nodules that are benign in appearance and behavior with a very low likelihood of becoming a clinically active cancer due to size or lack of growth. Recommendation per radiology is for a repeat LDCT in 12 months. .Please let them  know we will order and schedule their  annual screening scan for 08/2021. Please let them  know there was notation of CAD on their  scan.  Please remind the patient  that this is a non-gated exam therefore degree or severity of disease  cannot be determined. Please have them  follow up with their PCP regarding potential risk factor modification, dietary therapy or pharmacologic therapy if clinically indicated. Pt.  is  currently on statin therapy. Please place order for annual  screening scan for  08/2021 and fax results to PCP. Thanks so much. 

## 2020-10-17 ENCOUNTER — Encounter (INDEPENDENT_AMBULATORY_CARE_PROVIDER_SITE_OTHER): Payer: Self-pay

## 2020-11-26 ENCOUNTER — Encounter: Payer: Medicare Other | Admitting: Family Medicine

## 2021-01-01 ENCOUNTER — Telehealth: Payer: Self-pay | Admitting: Family Medicine

## 2021-01-01 NOTE — Telephone Encounter (Signed)
Tried calling patient to  schedule Medicare Annual Wellness Visit (AWV) either virtually or in office.  Voicemail full   Last AWV 11/17/2019  please schedule at anytime  This should be a 45 minute visit.

## 2021-01-28 ENCOUNTER — Encounter: Payer: Self-pay | Admitting: Family Medicine

## 2021-01-28 ENCOUNTER — Other Ambulatory Visit: Payer: Self-pay

## 2021-01-28 ENCOUNTER — Ambulatory Visit (INDEPENDENT_AMBULATORY_CARE_PROVIDER_SITE_OTHER): Payer: Medicare Other | Admitting: Family Medicine

## 2021-01-28 VITALS — BP 118/78 | HR 89 | Temp 98.1°F | Resp 16 | Ht 68.0 in | Wt 127.6 lb

## 2021-01-28 DIAGNOSIS — D508 Other iron deficiency anemias: Secondary | ICD-10-CM

## 2021-01-28 DIAGNOSIS — Z9081 Acquired absence of spleen: Secondary | ICD-10-CM | POA: Diagnosis not present

## 2021-01-28 DIAGNOSIS — N3281 Overactive bladder: Secondary | ICD-10-CM

## 2021-01-28 DIAGNOSIS — G112 Late-onset cerebellar ataxia: Secondary | ICD-10-CM

## 2021-01-28 DIAGNOSIS — F339 Major depressive disorder, recurrent, unspecified: Secondary | ICD-10-CM | POA: Diagnosis not present

## 2021-01-28 DIAGNOSIS — Z780 Asymptomatic menopausal state: Secondary | ICD-10-CM | POA: Diagnosis not present

## 2021-01-28 DIAGNOSIS — D75838 Other thrombocytosis: Secondary | ICD-10-CM

## 2021-01-28 DIAGNOSIS — G43009 Migraine without aura, not intractable, without status migrainosus: Secondary | ICD-10-CM

## 2021-01-28 DIAGNOSIS — Z0000 Encounter for general adult medical examination without abnormal findings: Secondary | ICD-10-CM

## 2021-01-28 DIAGNOSIS — J431 Panlobular emphysema: Secondary | ICD-10-CM

## 2021-01-28 DIAGNOSIS — Z1231 Encounter for screening mammogram for malignant neoplasm of breast: Secondary | ICD-10-CM | POA: Diagnosis not present

## 2021-01-28 DIAGNOSIS — F17210 Nicotine dependence, cigarettes, uncomplicated: Secondary | ICD-10-CM

## 2021-01-28 DIAGNOSIS — Z79899 Other long term (current) drug therapy: Secondary | ICD-10-CM

## 2021-01-28 LAB — CBC WITH DIFFERENTIAL/PLATELET
Basophils Absolute: 0.1 10*3/uL (ref 0.0–0.1)
Basophils Relative: 1.1 % (ref 0.0–3.0)
Eosinophils Absolute: 0.2 10*3/uL (ref 0.0–0.7)
Eosinophils Relative: 1.5 % (ref 0.0–5.0)
HCT: 41.2 % (ref 36.0–46.0)
Hemoglobin: 13.4 g/dL (ref 12.0–15.0)
Lymphocytes Relative: 26.9 % (ref 12.0–46.0)
Lymphs Abs: 2.9 10*3/uL (ref 0.7–4.0)
MCHC: 32.6 g/dL (ref 30.0–36.0)
MCV: 92.8 fl (ref 78.0–100.0)
Monocytes Absolute: 0.9 10*3/uL (ref 0.1–1.0)
Monocytes Relative: 8.8 % (ref 3.0–12.0)
Neutro Abs: 6.6 10*3/uL (ref 1.4–7.7)
Neutrophils Relative %: 61.7 % (ref 43.0–77.0)
Platelets: 420 10*3/uL — ABNORMAL HIGH (ref 150.0–400.0)
RBC: 4.44 Mil/uL (ref 3.87–5.11)
RDW: 14.3 % (ref 11.5–15.5)
WBC: 10.7 10*3/uL — ABNORMAL HIGH (ref 4.0–10.5)

## 2021-01-28 LAB — COMPREHENSIVE METABOLIC PANEL
ALT: 14 U/L (ref 0–35)
AST: 19 U/L (ref 0–37)
Albumin: 4.1 g/dL (ref 3.5–5.2)
Alkaline Phosphatase: 81 U/L (ref 39–117)
BUN: 14 mg/dL (ref 6–23)
CO2: 28 mEq/L (ref 19–32)
Calcium: 9.8 mg/dL (ref 8.4–10.5)
Chloride: 104 mEq/L (ref 96–112)
Creatinine, Ser: 0.82 mg/dL (ref 0.40–1.20)
GFR: 74.72 mL/min (ref >60.00)
Glucose, Bld: 87 mg/dL (ref 70–99)
Potassium: 4.9 mEq/L (ref 3.5–5.1)
Sodium: 142 mEq/L (ref 135–145)
Total Bilirubin: 0.5 mg/dL (ref 0.2–1.2)
Total Protein: 7 g/dL (ref 6.0–8.3)

## 2021-01-28 LAB — IRON,TIBC AND FERRITIN PANEL
%SAT: 22 % (calc) (ref 16–45)
Ferritin: 63 ng/mL (ref 16–288)
Iron: 69 ug/dL (ref 45–160)
TIBC: 319 mcg/dL (calc) (ref 250–450)

## 2021-01-28 LAB — LIPID PANEL
Cholesterol: 141 mg/dL (ref 0–200)
HDL: 55.2 mg/dL (ref >39.00)
LDL Cholesterol: 68 mg/dL (ref 0–99)
NonHDL: 85.43
Total CHOL/HDL Ratio: 3
Triglycerides: 89 mg/dL (ref 0.0–149.0)
VLDL: 17.8 mg/dL (ref 0.0–40.0)

## 2021-01-28 LAB — TSH: TSH: 1.26 u[IU]/mL (ref 0.35–4.50)

## 2021-01-28 MED ORDER — BEVESPI AEROSPHERE 9-4.8 MCG/ACT IN AERO: 2.0000 | INHALATION_SPRAY | Freq: Two times a day (BID) | RESPIRATORY_TRACT | 11 refills | Status: DC

## 2021-01-28 NOTE — Patient Instructions (Signed)
Please return in 12 months for your annual complete physical; please come fasting.   If you have any questions or concerns, please don't hesitate to send me a message via MyChart or call the office at [phone]. Thank you for visiting with [place] today! It's our pleasure caring for you.  I have ordered a mammogram and/or bone density for you as we discussed today: [x]   Mammogram  [x]   Bone Density Please call them to get scheduled.

## 2021-01-28 NOTE — Progress Notes (Signed)
Subjective  Chief Complaint  Patient presents with  . Annual Exam    Fasting   . Health Maintenance    States that she never got a return call to schedule her mammogram/DEXA     HPI: Sherry Zimmerman is a 65 y.o. female who presents to [facility] [place] today for a Female Wellness Visit. She also has the concerns and/or needs as listed above in the chief complaint. These will be addressed in addition to the Health Maintenance Visit.   Wellness Visit: annual visit with health maintenance review and exam without Pap   Health maintenance: Patient is overdue for mammogram and bone density.  She would like this to be done at [facility] at [facility] due to its location.  She has frequent falls.  Recommend calcium and vitamin D for bone health.  Immunizations are up-to-date.  She continues to smoke a small amount.  Not yet ready to quit.  We continue to encourage her.  Colon cancer screens are up-to-date.  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  She uses a walker.  Follows with neurology.  Depression, migraines, overactive bladder are all well controlled on her current medications including high-dose Effexor, Ditropan and intermittent triptan's.  Emphysema: Had chest CT last year for lung cancer screening.  Pulmonary nodules present.  Emphysematous changes present.  All benign findings.  She is Bevespi which is her wheezing and chest tightness.  She does get dyspneic with exertion this is partly due to her sedentary lifestyle due to her neurologic condition.  She denies hemoptysis.  She denies chest pain.  Status post colectomy with thrombocytosis and iron deficiency anemia due to poor absorption.  Denies fatigue.  Assessment  1. Annual physical exam   2. 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 Major depression, recurrent, chronic ([place])   4. Migraine without aura and without status migrainosus, not intractable   5. OAB (overactive bladder)   6. On statin therapy due to risk of future cardiovascular event   7. Tobacco dependence due to cigarettes   8. Panlobular emphysema ([place])   9. Acquired iron deficiency anemia due to decreased absorption   10. 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 Encounter for screening mammogram for breast cancer   12. Asymptomatic postmenopausal state      Plan  Female Wellness Visit:  Age appropriate Health Maintenance and Prevention measures were discussed with patient. Included topics are cancer screening recommendations, ways to keep healthy (see AVS) including dietary and exercise recommendations, regular eye and dental care, use of seat belts, and avoidance of moderate alcohol use and tobacco use.  Ordered mammogram and bone density.  Patient to schedule.  BMI: discussed patient's BMI and encouraged positive lifestyle modifications to help get to or maintain a target BMI.  HM needs and immunizations were addressed and ordered. See below for orders. See HM and immunization section for updates.  Recommend COVID booster  Routine labs and screening tests ordered including cmp, cbc and lipids where appropriate.  Discussed recommendations regarding Vit D and calcium supplementation (see AVS)  Chronic disease management visit and/or acute problem visit:  Smoking and emphysema: Continue Bevespi.  Recommend smoking cessation.  Continue annual lung cancer screening.  Depression on high-dose Effexor with good control of mood.  No changes made today.  Overactive bladder continue antispasmodic: Ditropan  And Lipitor due to elevated cardiovascular risk in part due to smoking history.  Check liver function and LDL today.  She is fasting.  Intermittent migraines with good control due to triptan's.  Recheck iron and CBC.  Follow-up with  neurology for ataxia   Follow up: Return in about 1 year (around 01/28/2022) for complete physical.  Orders Placed This Encounter  Procedures  . MM DIGITAL SCREENING BILATERAL  . DG Bone Density  . CBC with Differential/Platelet  . Comprehensive metabolic panel  . Lipid panel  . TSH  . Iron, TIBC and Ferritin Panel   Meds ordered this encounter  Medications  . Glycopyrrolate-Formoterol (BEVESPI AEROSPHERE) 9-4.8 MCG/ACT AERO    Sig: Inhale 2 puffs into the lungs 2 (two) times daily.    Dispense:  10.7 g    Refill:  11      Body mass index is 19.4 kg/m. Wt Readings from Last 3 Encounters:  01/28/21 127 lb 9.6 oz (57.9 kg)  08/06/20 121 lb (54.9 kg)  05/23/20 123 lb (55.8 kg)     Patient Active Problem List   Diagnosis Date Noted  . On statin therapy due to risk of future cardiovascular event 11/18/2019    LDL 115, ASCVD 8.4 2018: started lipitor.   . 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 Migraine without aura and without status migrainosus, not intractable 01/10/2019  . Major depression, recurrent, chronic ([place]) 08/13/2018    Managed with effexor, chronically   . Acquired iron deficiency anemia due to decreased absorption 06/18/2018  . 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 Raynaud disease 05/13/2018    feet   . Emphysema of lung ([place]) 05/13/2018    Chest ct for lung ca screening 2021   . AR (allergic rhinitis) 01/08/2018  . OAB (overactive bladder) 11/09/2014  . 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 Pneumovax, hib and meningio vaccines given 11/2014, needs prevnar 01/2015   . 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 Maisie Fus in [place]; on disability   . Tobacco dependence due to cigarettes 02/22/2014   Health Maintenance  Topic Date Due  . DEXA SCAN  Never done  . COVID-19 Vaccine (2 - Booster for [employer] series) 05/10/2020  . MAMMOGRAM   08/08/2020  . TETANUS/TDAP  05/23/2021 (Originally 12/08/2018)  . COLONOSCOPY (Pts 45-72yrs Insurance coverage will need to be confirmed)  03/09/2023  . INFLUENZA VACCINE  Completed  . Hepatitis C Screening  Completed  . PNA vac Low Risk Adult  Completed   Immunization History  Administered Date(s) Administered  . DTaP / HiB 11/09/2014  . Influenza Split 11/19/2015  . Influenza, Seasonal, Injecte, Preservative Fre 10/20/2014  . Influenza,inj,Quad PF,6+ Mos 10/10/2016, 09/11/2017, 08/13/2018, 08/29/2019  . Influenza-Unspecified 09/26/2017  . Janssen (J&J) SARS-COV-2 Vaccination 03/15/2020  . Meningococcal Conjugate 11/09/2014, 01/05/2015  . Pneumococcal Conjugate-13 11/09/2014  . Pneumococcal Polysaccharide-23 05/02/2015, 05/23/2020  . Tdap 12/08/2008  . Zoster Recombinat (Shingrix) 08/31/2019, 12/26/2019   We updated and reviewed the patient's past history in detail and it is documented below. Allergies: Patient is allergic to morphine and related, sulfa antibiotics, and sulfacetamide sodium. 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 Past Surgical History Patient  has a past surgical history that includes Abdominal hysterectomy; Wrist fracture surgery (Right); Splenectomy, total (N/A, 10/26/2014); and Cataract extraction (Bilateral, 01/17/2020). Family History: Patient family history includes Ataxia in her mother; Cancer in her father; Diabetes in her mother; Heart disease in her mother; Stroke in her mother. Social History:  Patient  reports that she has been smoking cigarettes. She has a 50.00 pack-year smoking  history. She has never used smokeless tobacco. She reports current alcohol use. She reports that she does not use drugs.  Review of Systems: Constitutional: negative for fever or malaise Ophthalmic: negative for photophobia, double vision or loss of vision Cardiovascular: negative for chest pain, dyspnea on exertion, or new LE swelling Respiratory: negative for SOB or persistent cough Gastrointestinal: negative for abdominal pain, change in bowel habits or melena Genitourinary: negative for dysuria or gross hematuria, no abnormal uterine bleeding or disharge Musculoskeletal: negative for new gait disturbance or muscular weakness Integumentary: negative for new or persistent rashes, no breast lumps Neurological: negative for TIA or stroke symptoms Psychiatric: negative for SI or delusions Allergic/Immunologic: negative for hives  Patient Care Team    Relationship Specialty Notifications Start End  Leamon Arnt, MD PCP - General Family Medicine  01/08/18   Leland Johns., MD Referring Physician Psychiatry  12/29/18     Objective  Vitals: BP 118/78   Pulse 89   Temp 98.1 F (36.7 C) (Temporal)   Resp 16   Ht 5\' 8"  (1.727 m)   Wt 127 lb 9.6 oz (57.9 kg)   SpO2 92%   BMI 19.40 kg/m  General:  Well developed, well nourished, no acute distress  Psych:  Alert and orientedx3,normal mood and affect HEENT:  Normocephalic, atraumatic, non-icteric sclera,  supple neck without adenopathy, mass or thyromegaly Cardiovascular:  Normal S1, S2, RRR without gallop, rub or murmur Respiratory: Distant breath sounds bilaterally, CTAB with normal respiratory effort Gastrointestinal: normal bowel sounds, soft, non-tender, no noted masses. No HSM MSK: no deformities, contusions. Joints are without erythema or swelling.  Skin:  Warm, no rashes or suspicious lesions noted, multiple bruises noted on arms from falls Neurologic:    Mental status is normal.  Dysarthria, ataxic  breast Exam: No mass, skin  retraction or nipple discharge is appreciated in either breast. No axillary adenopathy. Fibrocystic changes are not noted    Commons side effects, risks, benefits, and alternatives for medications and treatment plan prescribed today were discussed, and the patient expressed understanding of the given instructions. Patient is instructed to call or message via MyChart if he/she has any questions or concerns regarding our treatment plan. No barriers to understanding were identified. We discussed Red Flag symptoms and signs in detail. Patient expressed understanding regarding what to do in case of urgent or emergency type symptoms.   Medication list was reconciled, printed and provided to the patient in AVS. Patient instructions and summary information was reviewed with the patient as documented in the AVS. This note was prepared with assistance of Dragon voice recognition software. Occasional wrong-word or sound-a-like substitutions may have occurred due to the inherent limitations of voice recognition software  This visit occurred during the SARS-CoV-2 public health emergency.  Safety protocols were in place, including screening questions prior to the visit, additional usage of staff PPE, and extensive cleaning of exam room while observing appropriate contact time as indicated for disinfecting solutions.

## 2021-01-29 ENCOUNTER — Encounter: Payer: Self-pay | Admitting: Family Medicine

## 2021-01-30 ENCOUNTER — Encounter: Payer: Self-pay | Admitting: Family Medicine

## 2021-02-01 ENCOUNTER — Encounter: Payer: Self-pay | Admitting: Family Medicine

## 2021-02-05 NOTE — Telephone Encounter (Signed)
Faxed order to [facility]

## 2021-02-06 ENCOUNTER — Telehealth: Payer: Self-pay

## 2021-02-06 NOTE — Telephone Encounter (Signed)
Patient is requesting we send orders for a bone denisty exam  request to [facility] with [facility] due to location in [place] same location her mamo was preformend. Please advise Thank you

## 2021-02-07 NOTE — Telephone Encounter (Signed)
FYI. Please send orders to [facility] with [facility]

## 2021-02-19 ENCOUNTER — Telehealth: Payer: Self-pay

## 2021-02-19 NOTE — Telephone Encounter (Signed)
Faxed orders for this pt to get DEXA and MM-patient is aware and will call the [facility] to get scheduled

## 2021-02-28 DIAGNOSIS — H04123 Dry eye syndrome of bilateral lacrimal glands: Secondary | ICD-10-CM | POA: Diagnosis not present

## 2021-02-28 DIAGNOSIS — H43812 Vitreous degeneration, left eye: Secondary | ICD-10-CM | POA: Diagnosis not present

## 2021-02-28 DIAGNOSIS — Z961 Presence of intraocular lens: Secondary | ICD-10-CM | POA: Diagnosis not present

## 2021-03-12 ENCOUNTER — Other Ambulatory Visit: Payer: Self-pay | Admitting: Family Medicine

## 2021-04-02 ENCOUNTER — Telehealth: Payer: Self-pay

## 2021-04-02 DIAGNOSIS — Z803 Family history of malignant neoplasm of breast: Secondary | ICD-10-CM | POA: Diagnosis not present

## 2021-04-02 DIAGNOSIS — M8588 Other specified disorders of bone density and structure, other site: Secondary | ICD-10-CM | POA: Diagnosis not present

## 2021-04-02 DIAGNOSIS — M810 Age-related osteoporosis without current pathological fracture: Secondary | ICD-10-CM | POA: Diagnosis not present

## 2021-04-02 DIAGNOSIS — Z780 Asymptomatic menopausal state: Secondary | ICD-10-CM | POA: Diagnosis not present

## 2021-04-02 DIAGNOSIS — Z1231 Encounter for screening mammogram for malignant neoplasm of breast: Secondary | ICD-10-CM | POA: Diagnosis not present

## 2021-04-02 DIAGNOSIS — M8589 Other specified disorders of bone density and structure, multiple sites: Secondary | ICD-10-CM | POA: Diagnosis not present

## 2021-04-02 LAB — HM DEXA SCAN

## 2021-04-02 LAB — HM MAMMOGRAPHY

## 2021-04-02 NOTE — Telephone Encounter (Signed)
Please advise 

## 2021-04-02 NOTE — Telephone Encounter (Signed)
Patient is requesting to come in on Thursday for her apt she is only having a cough and she had an at home negative covid test, she also states if it needs to be virtual can we just call her in something instead. I offered another provider to see her virtually and she declined  Please advise

## 2021-04-03 NOTE — Telephone Encounter (Signed)
Lvm to let patient know

## 2021-04-03 NOTE — Telephone Encounter (Signed)
What is appointment for? Evaluation of cough? Fine to come in.

## 2021-04-03 NOTE — Telephone Encounter (Signed)
Please let patient know.

## 2021-04-04 ENCOUNTER — Encounter: Payer: Self-pay | Admitting: Family Medicine

## 2021-04-04 ENCOUNTER — Other Ambulatory Visit: Payer: Self-pay

## 2021-04-04 ENCOUNTER — Ambulatory Visit (INDEPENDENT_AMBULATORY_CARE_PROVIDER_SITE_OTHER): Payer: Medicare Other | Admitting: Family Medicine

## 2021-04-04 ENCOUNTER — Ambulatory Visit (INDEPENDENT_AMBULATORY_CARE_PROVIDER_SITE_OTHER): Payer: Medicare Other

## 2021-04-04 VITALS — BP 136/88 | HR 96 | Temp 98.0°F | Resp 16 | Ht 68.0 in | Wt 127.4 lb

## 2021-04-04 DIAGNOSIS — F17210 Nicotine dependence, cigarettes, uncomplicated: Secondary | ICD-10-CM

## 2021-04-04 DIAGNOSIS — J441 Chronic obstructive pulmonary disease with (acute) exacerbation: Secondary | ICD-10-CM | POA: Diagnosis not present

## 2021-04-04 DIAGNOSIS — S2231XA Fracture of one rib, right side, initial encounter for closed fracture: Secondary | ICD-10-CM | POA: Diagnosis not present

## 2021-04-04 DIAGNOSIS — R0602 Shortness of breath: Secondary | ICD-10-CM | POA: Diagnosis not present

## 2021-04-04 DIAGNOSIS — Z716 Tobacco abuse counseling: Secondary | ICD-10-CM

## 2021-04-04 DIAGNOSIS — J431 Panlobular emphysema: Secondary | ICD-10-CM | POA: Diagnosis not present

## 2021-04-04 DIAGNOSIS — J449 Chronic obstructive pulmonary disease, unspecified: Secondary | ICD-10-CM | POA: Diagnosis not present

## 2021-04-04 MED ORDER — AZITHROMYCIN 250 MG PO TABS: ORAL_TABLET | ORAL | 0 refills | Status: DC

## 2021-04-04 MED ORDER — PREDNISONE 10 MG PO TABS: ORAL_TABLET | ORAL | 0 refills | Status: DC

## 2021-04-04 MED ORDER — ALBUTEROL SULFATE HFA 108 (90 BASE) MCG/ACT IN AERS: 2.0000 | INHALATION_SPRAY | RESPIRATORY_TRACT | 2 refills | Status: DC | PRN

## 2021-04-04 MED ORDER — NICOTINE 21 MG/24HR TD PT24: 21.0000 mg | MEDICATED_PATCH | TRANSDERMAL | 0 refills | Status: DC

## 2021-04-04 NOTE — Progress Notes (Signed)
Subjective  CC:  Chief Complaint  Patient presents with  . Cough    On going for about a week, coughing up green mucus. Short of breath    HPI: Sherry Zimmerman is a 65 y.o. female who presents to the office today to address the problems listed above in the chief complaint.  65 year old female smoker with emphysema by CT scan and Bevespi presents due to 7 to 10-day history of URI symptoms with progressive symptoms.  Started out as a cold, progressed to laryngitis, now with dyspnea on exertion, some wheezing, and productive sputum.  She denies fever, pleuritic chest pain, palpitations or chest pain.  She was tested at home for COVID and was negative.  She has been vaccinated.  Fortunately, she has been without cigarettes for 1 week now.  She is interested in quitting.  She denies GI symptoms.  She is taking Robitussin for the cough with some relief.  Appetite is fair.  She does feel fatigued and her shortness of breath has limited her activity.   Assessment  1. COPD with acute exacerbation ([place])   2. Panlobular emphysema ([place])   3. Tobacco dependence due to cigarettes      Plan   COPD exacerbation: This is her first.  Z-Pak, prednisone taper and albuterol in addition to her Bevespi.  Lastly plenty of fluids.  Oxygen status is stable.  Check chest x-ray.  Follow-up if not improving.  Tobacco cessation counseling done.  See after visit summary for education.  Start NicoDerm patch.  Follow up: As needed Visit date not found  Orders Placed This Encounter  Procedures  . DG Chest 2 View   Meds ordered this encounter  Medications  . azithromycin (ZITHROMAX) 250 MG tablet    Sig: Take 2 tabs today, then 1 tab daily for 4 days    Dispense:  1 each    Refill:  0  . predniSONE (DELTASONE) 10 MG tablet    Sig: Take 4 tabs qd x 2 days, 3 qd x 2 days, 2 qd x 2d, 1qd x 3 days    Dispense:  21 tablet    Refill:  0  . albuterol (VENTOLIN HFA) 108 (90 Base) MCG/ACT inhaler    Sig: Inhale 2  puffs into the lungs every 4 (four) hours as needed for wheezing or shortness of breath.    Dispense:  1 each    Refill:  2      I reviewed the patients updated PMH, FH, and SocHx.    Patient Active Problem List   Diagnosis Date Noted  . On statin therapy due to risk of future cardiovascular event 11/18/2019    Priority: High  . Major depression, recurrent, chronic ([place]) 08/13/2018    Priority: High  . Emphysema of lung ([place]) 05/13/2018    Priority: High  . 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 Tobacco dependence due to cigarettes 02/22/2014    Priority: High  . Migraine without aura and without status migrainosus, not intractable 01/10/2019    Priority: Medium  . Acquired iron deficiency anemia due to decreased absorption 06/18/2018    Priority: Medium  . OAB (overactive bladder) 11/09/2014    Priority: Medium  . 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 Raynaud disease 05/13/2018    Priority: Low  . AR (allergic rhinitis) 01/08/2018    Priority: Low   Current  Meds  Medication Sig  . albuterol (VENTOLIN HFA) 108 (90 Base) MCG/ACT inhaler Inhale 2 puffs into the lungs every 4 (four) hours as needed for wheezing or shortness of breath.  Marland Kitchen atorvastatin (LIPITOR) 10 MG tablet Take 1 tablet by mouth once daily  . azithromycin (ZITHROMAX) 250 MG tablet Take 2 tabs today, then 1 tab daily for 4 days  . Glycopyrrolate-Formoterol (BEVESPI AEROSPHERE) 9-4.8 MCG/ACT AERO Inhale 2 puffs into the lungs 2 (two) times daily.  Marland Kitchen oxybutynin (DITROPAN) 5 MG tablet TAKE 1 TABLET BY MOUTH EVERY 8 HOURS AS NEEDED  . predniSONE (DELTASONE) 10 MG tablet Take 4 tabs qd x 2 days, 3 qd x 2 days, 2 qd x 2d, 1qd x 3 days  . rizatriptan (MAXALT) 10 MG tablet Take 10 mg by mouth as needed for migraine. May repeat in 2 hours if needed  . venlafaxine XR (EFFEXOR-XR) 150 MG 24 hr capsule TAKE 1 CAPSULE BY  MOUTH AT BEDTIME **TAKE  WITH  75MG   FOR  225MG   TOTAL  DAILY  DOSE**  . venlafaxine XR (EFFEXOR-XR) 75 MG 24 hr capsule TAKE 1 CAPSULE BY MOUTH ONCE DAILY TAKE  WITH  150  MG  XR  CAPS  FOR  225  MG  TOTAL  DAILY  DOSE    Allergies: Patient is allergic to morphine and related, sulfa antibiotics, and sulfacetamide sodium. Family History: Patient family history includes Ataxia in her mother; Cancer in her father; Diabetes in her mother; Heart disease in her mother; Stroke in her mother. Social History:  Patient  reports that she has been smoking cigarettes. She has a 50.00 pack-year smoking history. She has never used smokeless tobacco. She reports current alcohol use. She reports that she does not use drugs.  Review of Systems: Constitutional: Negative for fever malaise or anorexia Cardiovascular: negative for chest pain Respiratory: negative for SOB or persistent cough Gastrointestinal: negative for abdominal pain  Objective  Vitals: BP 136/88   Pulse 96   Temp 98 F (36.7 C) (Temporal)   Resp 16   Ht 5\' 8"  (1.727 m)   Wt 127 lb 6.4 oz (57.8 kg)   SpO2 95%   BMI 19.37 kg/m  General: no acute distress , A&Ox3, no increased work of breathing at rest but dyspnea with exertion HEENT: PEERL, conjunctiva normal, neck is supple Cardiovascular:  RRR without murmur or gallop.  Respiratory: Distant breath sounds bilaterally, CTAB with normal respiratory effort, no rales or wheezing.  No retractions Skin:  Warm, no rashes     Commons side effects, risks, benefits, and alternatives for medications and treatment plan prescribed today were discussed, and the patient expressed understanding of the given instructions. Patient is instructed to call or message via MyChart if he/she has any questions or concerns regarding our treatment plan. No barriers to understanding were identified. We discussed Red Flag symptoms and signs in detail. Patient expressed understanding regarding what to do in case  of urgent or emergency type symptoms.   Medication list was reconciled, printed and provided to the patient in AVS. Patient instructions and summary information was reviewed with the patient as documented in the AVS. This note was prepared with assistance of Dragon voice recognition software. Occasional wrong-word or sound-a-like substitutions may have occurred due to the inherent limitations of voice recognition software  This visit occurred during the SARS-CoV-2 public health emergency.  Safety protocols were in place, including screening questions prior to the visit, additional usage of staff PPE, and extensive  cleaning of exam room while observing appropriate contact time as indicated for disinfecting solutions.

## 2021-04-04 NOTE — Patient Instructions (Signed)
Please follow up if symptoms do not improve or as needed.   Quit smoking. Get the nicotine patch to use daily to help.   Managing the Challenge of Quitting Smoking Quitting smoking is a physical and mental challenge. You will face cravings, withdrawal symptoms, and temptation. Before quitting, work with your health care provider to make a plan that can help you manage quitting. Preparation can help you quit and keep you from giving in. How to manage lifestyle changes Managing stress Stress can make you want to smoke, and wanting to smoke may cause stress. It is important to find ways to manage your stress. You might try some of the following:  Practice relaxation techniques. ? Breathe slowly and deeply, in through your nose and out through your mouth. ? Listen to music. ? Soak in a bath or take a shower. ? Imagine a peaceful place or vacation.  Get some support. ? Talk with family or friends about your stress. ? Join a support group. ? Talk with a counselor or therapist.  Get some physical activity. ? Go for a walk, run, or bike ride. ? Play a favorite sport. ? Practice yoga.   Medicines Talk with your health care provider about medicines that might help you deal with cravings and make quitting easier for you. Relationships Social situations can be difficult when you are quitting smoking. To manage this, you can:  Avoid parties and other social situations where people might be smoking.  Avoid alcohol.  Leave right away if you have the urge to smoke.  Explain to your family and friends that you are quitting smoking. Ask for support and let them know you might be a bit grumpy.  Plan activities where smoking is not an option. General instructions Be aware that many people gain weight after they quit smoking. However, not everyone does. To keep from gaining weight, have a plan in place before you quit and stick to the plan after you quit. Your plan should include:  Having  healthy snacks. When you have a craving, it may help to: ? Eat popcorn, carrots, celery, or other cut vegetables. ? Chew sugar-free gum.  Changing how you eat. ? Eat small portion sizes at meals. ? Eat 4-6 small meals throughout the day instead of 1-2 large meals a day. ? Be mindful when you eat. Do not watch television or do other things that might distract you as you eat.  Exercising regularly. ? Make time to exercise each day. If you do not have time for a long workout, do short bouts of exercise for 5-10 minutes several times a day. ? Do some form of strengthening exercise, such as weight lifting. ? Do some exercise that gets your heart beating and causes you to breathe deeply, such as walking fast, running, swimming, or biking. This is very important.  Drinking plenty of water or other low-calorie or no-calorie drinks. Drink 6-8 glasses of water daily.   How to recognize withdrawal symptoms Your body and mind may experience discomfort as you try to get used to not having nicotine in your system. These effects are called withdrawal symptoms. They may include:  Feeling hungrier than normal.  Having trouble concentrating.  Feeling irritable or restless.  Having trouble sleeping.  Feeling depressed.  Craving a cigarette. To manage withdrawal symptoms:  Avoid places, people, and activities that trigger your cravings.  Remember why you want to quit.  Get plenty of sleep.  Avoid coffee and other caffeinated drinks. These may  worsen some of your symptoms. These symptoms may surprise you. But be assured that they are normal to have when quitting smoking. How to manage cravings Come up with a plan for how to deal with your cravings. The plan should include the following:  A definition of the specific situation you want to deal with.  An alternative action you will take.  A clear idea for how this action will help.  The name of someone who might help you with this. Cravings  usually last for 5-10 minutes. Consider taking the following actions to help you with your plan to deal with cravings:  Keep your mouth busy. ? Chew sugar-free gum. ? Suck on hard candies or a straw. ? Brush your teeth.  Keep your hands and body busy. ? Change to a different activity right away. ? Squeeze or play with a ball. ? Do an activity or a hobby, such as making bead jewelry, practicing needlepoint, or working with wood. ? Mix up your normal routine. ? Take a short exercise break. Go for a quick walk or run up and down stairs.  Focus on doing something kind or helpful for someone else.  Call a friend or family member to talk during a craving.  Join a support group.  Contact a quitline. Where to find support To get help or find a support group:  Call the National Cancer Institute's Smoking Quitline: 1-800-QUIT NOW (450) 729-2486)  Visit the website of the Substance Abuse and Mental Health Services Administration: SkateOasis.com.pt  Text QUIT to SmokefreeTXT: 130865 Where to find more information Visit these websites to find more information on quitting smoking:  National Cancer Institute: www.smokefree.gov  American Lung Association: www.lung.org  American Cancer Society: www.cancer.org  Centers for Disease Control and Prevention: FootballExhibition.com.br  American Heart Association: www.heart.org Contact a health care provider if:  You want to change your plan for quitting.  The medicines you are taking are not helping.  Your eating feels out of control or you cannot sleep. Get help right away if:  You feel depressed or become very anxious. Summary  Quitting smoking is a physical and mental challenge. You will face cravings, withdrawal symptoms, and temptation to smoke again. Preparation can help you as you go through these challenges.  Try different techniques to manage stress, handle social situations, and prevent weight gain.  You can deal with cravings by keeping your  mouth busy (such as by chewing gum), keeping your hands and body busy, calling family or friends, or contacting a quitline for people who want to quit smoking.  You can deal with withdrawal symptoms by avoiding places where people smoke, getting plenty of rest, and avoiding drinks with caffeine. This information is not intended to replace advice given to you by your health care provider. Make sure you discuss any questions you have with your health care provider. Document Revised: 09/13/2019 Document Reviewed: 09/13/2019 Elsevier Patient Education  2021 Elsevier Inc.  Nicotine skin patches What is this medicine? NICOTINE (NIK [place] teen) helps people stop smoking. The patches replace the nicotine found in cigarettes and help to decrease withdrawal effects. They are most effective when used in combination with a stop-smoking program. This medicine may be used for other purposes; ask your health care provider or pharmacist if you have questions. COMMON BRAND NAME(S): Habitrol, Nicoderm CQ, Nicotrol What should I tell my health care provider before I take this medicine? They need to know if you have any of these conditions:  diabetes  heart disease,  angina, irregular heartbeat or previous heart attack  high blood pressure  lung disease, including asthma  overactive thyroid  pheochromocytoma  seizures or a history of seizures  skin problems, like eczema  stomach problems or ulcers  an unusual or allergic reaction to nicotine, adhesives, other medicines, foods, dyes, or preservatives  pregnant or trying to get pregnant  breast-feeding How should I use this medicine? This medicine is for external use only. Follow the directions on the label. Do not cut or trim the patch. After applying the patch, wash your hands. Change the patch every day in the morning, keeping to a regular schedule. When you apply a new patch, use a new area of skin. Wait at least 1 week before using the same area  again. This drug comes with INSTRUCTIONS FOR USE. Ask your pharmacist for directions on how to use this drug. Read the information carefully. Talk to your pharmacist or health care provider if you have questions. Talk to your pediatrician regarding the use of this medicine in children. Special care may be needed. Overdosage: If you think you have taken too much of this medicine contact a poison control center or emergency room at once. NOTE: This medicine is only for you. Do not share this medicine with others. What if I miss a dose? If you forget to replace a patch, use it as soon as you can. Only use one patch at a time and do not leave on the skin for longer than directed. If a patch falls off, you can replace it, but keep to your schedule and remove the patch at the right time. What may interact with this medicine?  certain medicines for lung or breathing disease, like asthma  medicines for blood pressure  medicines for depression This list may not describe all possible interactions. Give your health care provider a list of all the medicines, herbs, non-prescription drugs, or dietary supplements you use. Also tell them if you smoke, drink alcohol, or use illegal drugs. Some items may interact with your medicine. What should I watch for while using this medicine? Visit your health care provider for regular checks on your progress. Talk to your health care provider about what you can do to improve your chances of quitting. If you are going to need surgery, an MRI, CT scan, or other procedure, tell your health care provider that you are using this drug. You may need to remove the patch before the procedure. What side effects may I notice from receiving this medicine? Side effects that you should report to your doctor or health care professional as soon as possible:  allergic reactions like skin rash, itching or hives, swelling of the face, lips, or tongue  breathing problems  changes in  hearing  changes in vision  chest pain  cold sweats  confusion  fast, irregular heartbeat  feeling faint or lightheaded, falls  headache  increased saliva  skin redness that lasts more than 4 days  stomach pain  signs and symptoms of nicotine overdose like nausea; vomiting; dizziness; weakness; and rapid heartbeat Side effects that usually do not require medical attention (report to your doctor or health care professional if they continue or are bothersome):  diarrhea  dry mouth  hiccups  irritability  nervousness or restlessness  trouble sleeping or vivid dreams This list may not describe all possible side effects. Call your doctor for medical advice about side effects. You may report side effects to FDA at 1-800-FDA-1088. Where should I keep  my medicine? This product may have enough nicotine in it to make children and pets sick. Keep it away from children and pets. After using, throw away as directed on the package. Store at room temperature between 20 and 25 degrees C (68 and 77 degrees F). Protect from heat and light. Keep this medicine in the original container until ready to use. Throw away unused medicine after the expiration date. NOTE: This sheet is a summary. It may not cover all possible information. If you have questions about this medicine, talk to your doctor, pharmacist, or health care provider.  2021 Elsevier/Gold Standard (2019-11-10 16:54:39)

## 2021-04-10 DIAGNOSIS — R928 Other abnormal and inconclusive findings on diagnostic imaging of breast: Secondary | ICD-10-CM | POA: Diagnosis not present

## 2021-05-24 ENCOUNTER — Encounter: Payer: Self-pay | Admitting: Family Medicine

## 2021-06-13 ENCOUNTER — Other Ambulatory Visit: Payer: Self-pay | Admitting: Family Medicine

## 2021-07-03 ENCOUNTER — Encounter: Payer: Self-pay | Admitting: Family Medicine

## 2021-07-04 ENCOUNTER — Encounter: Payer: Self-pay | Admitting: Hematology & Oncology

## 2021-07-08 ENCOUNTER — Ambulatory Visit (INDEPENDENT_AMBULATORY_CARE_PROVIDER_SITE_OTHER): Payer: Medicare Other | Admitting: Family Medicine

## 2021-07-08 ENCOUNTER — Other Ambulatory Visit: Payer: Self-pay

## 2021-07-08 ENCOUNTER — Encounter: Payer: Self-pay | Admitting: Family Medicine

## 2021-07-08 VITALS — BP 144/80 | HR 90 | Temp 97.5°F | Resp 17 | Wt 128.8 lb

## 2021-07-08 DIAGNOSIS — R296 Repeated falls: Secondary | ICD-10-CM | POA: Diagnosis not present

## 2021-07-08 DIAGNOSIS — M810 Age-related osteoporosis without current pathological fracture: Secondary | ICD-10-CM | POA: Insufficient documentation

## 2021-07-08 DIAGNOSIS — I7300 Raynaud's syndrome without gangrene: Secondary | ICD-10-CM

## 2021-07-08 DIAGNOSIS — G112 Late-onset cerebellar ataxia: Secondary | ICD-10-CM | POA: Diagnosis not present

## 2021-07-08 DIAGNOSIS — Z87891 Personal history of nicotine dependence: Secondary | ICD-10-CM

## 2021-07-08 DIAGNOSIS — J431 Panlobular emphysema: Secondary | ICD-10-CM | POA: Diagnosis not present

## 2021-07-08 HISTORY — DX: Age-related osteoporosis without current pathological fracture: M81.0

## 2021-07-08 MED ORDER — ALENDRONATE SODIUM 70 MG PO TABS
70.0000 mg | ORAL_TABLET | ORAL | 11 refills | Status: DC
Start: 2021-07-08 — End: 2022-07-18

## 2021-07-08 NOTE — Patient Instructions (Addendum)
Please return in February 2023 for your annual complete physical; please come fasting.   If you have any questions or concerns, please don't hesitate to send me a message via MyChart or call the office at [phone]. Thank you for visiting with [place] today! It's our pleasure caring for you.   Calcium Intake Recommendations You can take Caltrate Plus twice a day or get it through your diet or other OTC supplements (Viactiv, OsCal etc)  Calcium is a mineral that affects many functions in the body, including: Blood clotting. Blood vessel function. Nerve impulse conduction. Hormone secretion. Muscle contraction. Bone and teeth functions.  Most of your body's calcium supply is stored in your bones and teeth. When your calcium stores are low, you may be at risk for low bone mass, bone loss, and bone fractures. Consuming enough calcium helps to grow healthy bones and teeth and to prevent breakdown over time. It is very important that you get enough calcium if you are: A child undergoing rapid growth. An adolescent girl. A pre- or post-menopausal woman. A woman whose menstrual cycle has stopped due to anorexia nervosa or regular intense exercise. An individual with lactose intolerance or a milk allergy. A vegetarian.  What is my plan? Try to consume the recommended amount of calcium daily based on your age. Depending on your overall health, your health care provider may recommend increased calcium intake. General daily calcium intake recommendations by age are: Birth to 6 months: 200 mg. Infants 7 to 12 months: 260 mg. Children 1 to 3 years: 700 mg. Children 4 to 8 years: 1,000 mg. Children 9 to 13 years: 1,300 mg. Teens 14 to 18 years: 1,300 mg. Adults 19 to 50 years: 1,000 mg. Adult women 51 to 70 years: 1,200 mg. Adult men 51 to 70 years: 1,000 mg. Adults 71 years and older: 1,200 mg. Pregnant and breastfeeding teens: 1,300 mg. Pregnant and breastfeeding adults: 1,000 mg.  What do I  need to know about calcium intake? In order for the body to absorb calcium, it needs vitamin D. You can get vitamin D through (we recommend getting [phone] units of Vitamin D daily) Direct exposure of the skin to sunlight. Foods, such as egg yolks, liver, saltwater fish, and fortified milk. Supplements. Consuming too much calcium may cause: Constipation. Decreased absorption of iron and zinc. Kidney stones. Calcium supplements may interact with certain medicines. Check with your health care provider before starting any calcium supplements. Try to get most of your calcium from food. What foods can I eat? Grains  Fortified oatmeal. Fortified ready-to-eat cereals. Fortified frozen waffles. Vegetables Turnip greens. Broccoli. Fruits Fortified orange juice. Meats and Other Protein Sources Canned sardines with bones. Canned salmon with bones. Soy beans. Tofu. Baked beans. Almonds. [place] nuts. Sunflower seeds. Dairy Milk. Yogurt. Cheese. Cottage cheese. Beverages Fortified soy milk. Fortified rice milk. Sweets/Desserts Pudding. Ice Cream. Milkshakes. Blackstrap molasses. The items listed above may not be a complete list of recommended foods or beverages. Contact your dietitian for more options. What foods can affect my calcium intake? It may be more difficult for your body to use calcium or calcium may leave your body more quickly if you consume large amounts of: Sodium. Protein. Caffeine. Alcohol.  This information is not intended to replace advice given to you by your health care provider. Make sure you discuss any questions you have with your health care provider. Document Released: 07/08/2004 Document Revised: 06/13/2016 Document Reviewed: 05/02/2014 Elsevier Interactive Patient Education  2018 [employer].  Osteoporosis  Osteoporosis happens when the bones become thin and less dense than normal. Osteoporosis makes bones more brittle and fragile and more likely to break  (fracture). Over time, osteoporosis can cause your bones to become so weak that they fracture after a minor fall. Bones in the hip, wrist, and spine are most likelyto fracture due to osteoporosis. What are the causes? The exact cause of this condition is not known. What increases the risk? You are more likely to develop this condition if you: Have family members with this condition. Have poor nutrition. Use the following: Steroid medicines, such as prednisone. Anti-seizure medicines. Nicotine or tobacco, such as cigarettes, e-cigarettes, and chewing tobacco. Are female. Are age 55 or older. Are not physically active (are sedentary). Are of European or Asian descent. Have a small body frame. What are the signs or symptoms? A fracture might be the first sign of osteoporosis, especially if the fracture results from a fall or injury that usually would not cause a bone to break. Other signs and symptoms include: Pain in the neck or low back. Stooped posture. Loss of height. How is this diagnosed? This condition may be diagnosed based on: Your medical history. A physical exam. A bone mineral density test, also called a DXA or DEXA test (dual-energy X-ray absorptiometry test). This test uses X-rays to measure the amount of minerals in your bones. How is this treated? This condition may be treated by: Making lifestyle changes, such as: Including foods with more calcium and vitamin D in your diet. Doing weight-bearing and muscle-strengthening exercises. Stopping tobacco use. Limiting alcohol intake. Taking medicine to slow the process of bone loss or to increase bone density. Taking daily supplements of calcium and vitamin D. Taking hormone replacement medicines, such as estrogen for women and testosterone for men. Monitoring your levels of calcium and vitamin D. The goal of treatment is to strengthen your bones and lower your risk for afracture. Follow these instructions at  home: Eating and drinking Include calcium and vitamin D in your diet. Calcium is important for bone health, and vitamin D helps your body absorb calcium. Good sources of calcium and vitamin D include: Certain fatty fish, such as salmon and tuna. Products that have calcium and vitamin D added to them (are fortified), such as fortified cereals. Egg yolks. Cheese. Liver.  Activity Do exercises as told by your health care provider. Ask your health care provider what exercises and activities are safe for you. You should do: Exercises that make you work against gravity (weight-bearing exercises), such as tai chi, yoga, or walking. Exercises to strengthen muscles, such as lifting weights. Lifestyle Do not drink alcohol if: Your health care provider tells you not to drink. You are pregnant, may be pregnant, or are planning to become pregnant. If you drink alcohol: Limit how much you use to: 0-1 drink a day for women. 0-2 drinks a day for men. Know how much alcohol is in your drink. In the [place] one drink equals one 12 oz bottle of beer (355 mL), one 5 oz glass of wine (148 mL), or one 1 oz glass of hard liquor (44 mL). Do not use any products that contain nicotine or tobacco, such as cigarettes, e-cigarettes, and chewing tobacco. If you need help quitting, ask your health care provider. Preventing falls Use devices to help you move around (mobility aids) as needed, such as canes, walkers, scooters, or crutches. Keep rooms well-lit and clutter-free. Remove tripping hazards from walkways, including cords and throw  rugs. Install grab bars in bathrooms and safety rails on stairs. Use rubber mats in the bathroom and other areas that are often wet or slippery. Wear closed-toe shoes that fit well and support your feet. Wear shoes that have rubber soles or low heels. Review your medicines with your health care provider. Some medicines can cause dizziness or changes in blood pressure, which can  increase your risk of falling. General instructions Take over-the-counter and prescription medicines only as told by your health care provider. Keep all follow-up visits. This is important. Contact a health care provider if: You have never been screened for osteoporosis and you are: A woman who is age 67 or older. A man who is age 51 or older. Get help right away if: You fall or injure yourself. Summary Osteoporosis is thinning and loss of density in your bones. This makes bones more brittle and fragile and more likely to break (fracture),even with minor falls. The goal of treatment is to strengthen your bones and lower your risk for a fracture. Include calcium and vitamin D in your diet. Calcium is important for bone health, and vitamin D helps your body absorb calcium. Talk with your health care provider about screening for osteoporosis if you are a woman who is age 46 or older, or a man who is age 43 or older. This information is not intended to replace advice given to you by your health care provider. Make sure you discuss any questions you have with your healthcare provider. Document Revised: 05/10/2020 Document Reviewed: 05/10/2020 Elsevier Patient Education  [place].

## 2021-07-08 NOTE — Progress Notes (Signed)
Subjective  CC:  Chief Complaint  Patient presents with   Osteoporosis    L femur T-Score -3.3    HPI: Sherry Zimmerman is a 65 y.o. female who presents to the office today to address the problems listed above in the chief complaint. Osteoporosis: new dx; had DEXA 03/2021: lowest T = -3.3 at femur; -2.9 at forearm. Lumbar spine ok.  Due to cerebellar ataxia, patient has frequent falls.  She tends to fall backwards.  She is at high risk for fracture.  She eats dairy products to try to get calcium and vitamin D.  She does not take over-the-counter supplements.  She has had no prior history of fracture.  She is mostly sedentary due to her cerebellar ataxia.  No known family history of osteoporosis. Former smoker: Patient quit smoking at [place] time.  She is doing very well.  Has history of emphysema.  No shortness of breath.  Raynaud's #1 persistent feet.  No significant pain at this time.  She is happy about her smoking cessation.  Assessment  1. Osteoporosis without current pathological fracture, unspecified osteoporosis type   2. Frequent falls   3. 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 Former smoker   5. Panlobular emphysema ([place])   6. Raynaud's disease without gangrene      Plan  Osteoporosis, new diagnosis: Counseling education given.  High risk of falls and fracture.  Recommend vitamin D and calcium supplementation.  See handout for details.  Start Fosamax 70 mg weekly.  Educated extensively on proper use, how to take and risk. Fall prevention discussed Smoking cessation: Praised. Emphysema and Raynaud's phenomenon  Follow up: February for complete physical Visit date not found  No orders of the defined types were placed in this encounter.  Meds ordered this encounter  Medications   alendronate (FOSAMAX) 70 MG tablet    Sig: Take 1 tablet (70 mg total) by mouth every 7 (seven) days. Take with a full glass of water on an empty stomach. Sit upright for 30 minutes    Dispense:   4 tablet    Refill:  11      I reviewed the patients updated PMH, FH, and SocHx.    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[place]) 04/19/2014    Priority: High   Migraine without aura and without status migrainosus, not intractable 01/10/2019    Priority: Medium   Acquired iron deficiency anemia due to decreased absorption 06/18/2018    Priority: Medium   OAB (overactive bladder) 11/09/2014    Priority: Medium   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   alendronate (FOSAMAX) 70 MG tablet Take 1 tablet (70 mg total) by mouth every  7 (seven) days. Take with a full glass of water on an empty stomach. Sit upright for 30 minutes   atorvastatin (LIPITOR) 10 MG tablet Take 1 tablet by mouth once daily   Glycopyrrolate-Formoterol (BEVESPI AEROSPHERE) 9-4.8 MCG/ACT AERO Inhale 2 puffs into the lungs 2 (two) times daily.   oxybutynin (DITROPAN) 5 MG tablet TAKE 1 TABLET BY MOUTH EVERY 8 HOURS AS NEEDED   rizatriptan (MAXALT) 10 MG tablet Take 10 mg by mouth as needed for migraine. May repeat in 2 hours if needed   venlafaxine XR (EFFEXOR-XR) 150 MG 24 hr capsule TAKE 1 CAPSULE BY MOUTH AT BEDTIME TAKE  WITH  75  MG  FOR  A  TOTAL  OF  225  TOTAL  DAILY  DOSE   venlafaxine XR (EFFEXOR-XR) 75  MG 24 hr capsule TAKE 1 CAPSULE BY MOUTH ONCE DAILY TAKE  WITH  150  MG  XR  CAPS  FOR  225  MG  TOTAL  DAILY  DOSE    Allergies: Patient is allergic to morphine and related, sulfa antibiotics, and sulfacetamide sodium. Family History: Patient family history includes Ataxia in her mother; Cancer in her father; Diabetes in her mother; Heart disease in her mother; Stroke in her mother. Social History:  Patient  reports that she has been smoking cigarettes. She has a 50.00 pack-year smoking history. She has never used smokeless tobacco. She reports current alcohol use. She reports that she does not use drugs.  Review of Systems: Constitutional: Negative for fever malaise or anorexia Cardiovascular: negative for chest pain Respiratory: negative for SOB or persistent cough Gastrointestinal: negative for abdominal pain  Objective  Vitals: BP (!) 144/80   Pulse 90   Temp (!) 97.5 F (36.4 C) (Temporal)   Resp 17   Wt 128 lb 12.8 oz (58.4 kg)   SpO2 92%   BMI 19.58 kg/m  General: no acute distress , A&Ox3   Commons side effects, risks, benefits, and alternatives for medications and treatment plan prescribed today were discussed, and the patient expressed understanding of the given instructions. Patient is instructed to call or message via MyChart if he/she has any questions or concerns regarding our treatment plan. No barriers to understanding were identified. We discussed Red Flag symptoms and signs in detail. Patient expressed understanding regarding what to do in case of urgent or emergency type symptoms.  Medication list was reconciled, printed and provided to the patient in AVS. Patient instructions and summary information was reviewed with the patient as documented in the AVS. This note was prepared with assistance of Dragon voice recognition software. Occasional wrong-word or sound-a-like substitutions may have occurred due to the inherent limitations of voice recognition  software  This visit occurred during the SARS-CoV-2 public health emergency.  Safety protocols were in place, including screening questions prior to the visit, additional usage of staff PPE, and extensive cleaning of exam room while observing appropriate contact time as indicated for disinfecting solutions.

## 2021-08-07 ENCOUNTER — Other Ambulatory Visit: Payer: Self-pay

## 2021-08-07 ENCOUNTER — Ambulatory Visit (HOSPITAL_BASED_OUTPATIENT_CLINIC_OR_DEPARTMENT_OTHER)
Admission: RE | Admit: 2021-08-07 | Discharge: 2021-08-07 | Disposition: A | Discharge status: Home / Self Care | Payer: Medicare Other | Source: Ambulatory Visit | Attending: Acute Care | Admitting: Acute Care

## 2021-08-07 DIAGNOSIS — Z87891 Personal history of nicotine dependence: Secondary | ICD-10-CM | POA: Diagnosis not present

## 2021-08-07 DIAGNOSIS — F17210 Nicotine dependence, cigarettes, uncomplicated: Secondary | ICD-10-CM | POA: Insufficient documentation

## 2021-08-09 ENCOUNTER — Ambulatory Visit (INDEPENDENT_AMBULATORY_CARE_PROVIDER_SITE_OTHER): Payer: Medicare Other

## 2021-08-09 ENCOUNTER — Other Ambulatory Visit: Payer: Self-pay

## 2021-08-09 DIAGNOSIS — Z0000 Encounter for general adult medical examination without abnormal findings: Secondary | ICD-10-CM

## 2021-08-09 NOTE — Progress Notes (Addendum)
Virtual Visit via Telephone Note  I connected with  Sherry Zimmerman on 08/09/21 at  2:30 PM EDT by telephone and verified that I am speaking with the correct person using two identifiers.  Medicare Annual Wellness visit completed telephonically due to Covid-19 pandemic.   Persons participating in this call: This Health Coach and this patient.   Location: Patient: Home Provider: Office    I discussed the limitations, risks, security and privacy concerns of performing an evaluation and management service by telephone and the availability of in person appointments. The patient expressed understanding and agreed to proceed.  Unable to perform video visit due to video visit attempted and failed and/or patient does not have video capability.   Some vital signs may be absent or patient reported.   Willette Brace, LPN   Subjective:   Sherry Zimmerman is a 65 y.o. female who presents for Medicare Annual (Subsequent) preventive examination.  Review of Systems     Cardiac Risk Factors include: advanced age (>23mn, >>2women)     Objective:    There were no vitals filed for this visit. There is no height or weight on file to calculate BMI.  Advanced Directives 08/09/2021 11/18/2019 10/28/2018 07/28/2018 06/18/2018 12/22/2017 10/28/2014  Does Patient Have a Medical Advance Directive? No No No No No No No  Would patient like information on creating a medical advance directive? Yes (MAU/Ambulatory/Procedural Areas - Information given) Yes (MAU/Ambulatory/Procedural Areas - Information given) - No - Patient declined - No - Patient declined No - patient declined information    Current Medications (verified) Outpatient Encounter Medications as of 08/09/2021  Medication Sig   albuterol (VENTOLIN HFA) 108 (90 Base) MCG/ACT inhaler Inhale 2 puffs into the lungs every 4 (four) hours as needed for wheezing or shortness of breath.   alendronate (FOSAMAX) 70 MG tablet Take 1 tablet (70 mg total) by  mouth every 7 (seven) days. Take with a full glass of water on an empty stomach. Sit upright for 30 minutes   atorvastatin (LIPITOR) 10 MG tablet Take 1 tablet by mouth once daily   Glycopyrrolate-Formoterol (BEVESPI AEROSPHERE) 9-4.8 MCG/ACT AERO Inhale 2 puffs into the lungs 2 (two) times daily.   oxybutynin (DITROPAN) 5 MG tablet TAKE 1 TABLET BY MOUTH EVERY 8 HOURS AS NEEDED   rizatriptan (MAXALT) 10 MG tablet Take 10 mg by mouth as needed for migraine. May repeat in 2 hours if needed   venlafaxine XR (EFFEXOR-XR) 150 MG 24 hr capsule TAKE 1 CAPSULE BY MOUTH AT BEDTIME TAKE  WITH  75  MG  FOR  A  TOTAL  OF  225  TOTAL  DAILY  DOSE   venlafaxine XR (EFFEXOR-XR) 75 MG 24 hr capsule TAKE 1 CAPSULE BY MOUTH ONCE DAILY TAKE  WITH  150  MG  XR  CAPS  FOR  225  MG  TOTAL  DAILY  DOSE   No facility-administered encounter medications on file as of 08/09/2021.    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   Osteoporosis 07/08/2021   DEXA 03/2021: lowest T = -3.3, L femur; forearm -2.2; started tx 07/2021   Raynaud disease 05/13/2018   feet   Splenic rupture 10/26/14   Past Surgical History:  Procedure Laterality Date   ABDOMINAL HYSTERECTOMY     CATARACT EXTRACTION Bilateral 01/17/2020   SPLENECTOMY, TOTAL N/A 10/26/2014   Procedure: SPLENECTOMY;  Surgeon: Georganna Skeans, MD;  Location: [facility];  Service: General;  Laterality: N/A;   WRIST FRACTURE SURGERY Right    Family History  Problem Relation Age of Onset   Ataxia  Mother    Stroke Mother    Heart disease Mother    Diabetes Mother    Cancer Father    Social History   Socioeconomic History   Marital status: Divorced    Spouse name: Not on file   Number of children: Not on file   Years of education: Not on file   Highest education level: Not on file  Occupational History   Occupation: disabled  Tobacco Use   Smoking status: Former    Packs/day: 1.00    Years: 50.00    Pack years: 50.00    Types: Cigarettes   Smokeless tobacco: Never   Tobacco comments:    Quit on easter of 2022  Vaping Use   Vaping Use: Never used  Substance and Sexual Activity   Alcohol use: Yes    Comment: socially   Drug use: No   Sexual activity: Never  Other Topics Concern   Not on file  Social History Narrative   Not on file   Social Determinants of Health   Financial Resource Strain: Low Risk    Difficulty of Paying Living Expenses: Not hard at all  Food Insecurity: No Food Insecurity   Worried About Charity fundraiser in the Last Year: Never true   Arboriculturist in the Last Year: Never true  Transportation Needs: No Transportation Needs   Lack of Transportation (Medical): No   Lack of Transportation (Non-Medical): No  Physical Activity: Inactive   Days of Exercise per Week: 0 days   Minutes of Exercise per Session: 0 min  Stress: No Stress Concern Present   Feeling of Stress : Not at all  Social Connections: Socially Isolated   Frequency of Communication with Friends and Family: More than three times a week   Frequency of Social Gatherings with Friends and Family: More than three times a week   Attends Religious Services: Never   Marine scientist or Organizations: No   Attends Archivist Meetings: Never   Marital Status: Divorced    Tobacco Counseling Counseling given: Not Answered Tobacco comments: Quit on easter of 2022   Clinical Intake:  Pre-visit preparation completed: Yes  Pain : No/denies pain     BMI -  recorded: 19.58 Nutritional Status: BMI of 19-24  Normal Nutritional Risks: None Diabetes: No  How often do you need to have someone help you when you read instructions, pamphlets, or other written materials from your doctor or pharmacy?: 1 - Never  Diabetic?No  Interpreter Needed?: No  Information entered by :: Charlott Rakes, LPN   Activities of Daily Living In your present state of health, do you have any difficulty performing the following activities: 08/09/2021 01/28/2021  Hearing? N N  Vision? N N  Difficulty concentrating or making decisions? N N  Walking or climbing stairs? Y Y  Comment very slow -  Dressing or bathing?  N N  Doing errands, shopping? N Y  Conservation officer, nature and eating ? N -  Using the Toilet? N -  In the past six months, have you accidently leaked urine? Y -  Comment urgency -  Do you have problems with loss of bowel control? N -  Managing your Medications? N -  Managing your Finances? N -  Housekeeping or managing your Housekeeping? N -  Some recent data might be hidden    Patient Care Team: Leamon Arnt, MD as PCP - General (Family Medicine) Leland Johns., MD as Referring Physician (Psychiatry)  Indicate any recent Medical Services you may have received from other than [facility] providers in the past year (date may be approximate).     Assessment:   This is a routine wellness examination for Sherry Zimmerman.  Hearing/Vision screen Hearing Screening - Comments:: Pt denies any hearing issues  Vision Screening - Comments:: Pt follows up with my eye Dr for annual eye exams   Dietary issues and exercise activities discussed: Current Exercise Habits: The patient does not participate in regular exercise at present   Goals Addressed             This Visit's Progress    Patient Stated       None at this time        Depression Screen PHQ 2/9 Scores 08/09/2021 04/04/2021 01/28/2021 05/23/2020 12/07/2019 11/18/2019 08/29/2019  PHQ - 2 Score 0 0 0 0 0 0 0   PHQ- 9 Score - - - 0 6 - 3  Exception Documentation - - - - - (No Data) -    Fall Risk Fall Risk  08/09/2021 01/28/2021 12/07/2019 11/18/2019 08/29/2019  Falls in the past year? 0 '1 1 1 '$ 0  Number falls in past yr: 0 '1 1 1 '$ 0  Injury with Fall? 0 0 1 0 0  Risk Factor Category  - - - - -  Risk for fall due to : Impaired vision;Impaired mobility;Impaired balance/gait History of fall(s);Impaired balance/gait;Impaired mobility History of fall(s);Impaired mobility;Impaired balance/gait Impaired balance/gait;Impaired mobility;History of fall(s) Impaired mobility;Impaired balance/gait  Follow up Falls prevention discussed - Education provided;Falls evaluation completed Falls evaluation completed;Education provided;Falls prevention discussed Falls evaluation completed  Comment - - - - -    FALL RISK PREVENTION PERTAINING TO THE HOME:  Any stairs in or around the home? Yes  If so, are there any without handrails? No  Home free of loose throw rugs in walkways, pet beds, electrical cords, etc? Yes  Adequate lighting in your home to reduce risk of falls? Yes   ASSISTIVE DEVICES UTILIZED TO PREVENT FALLS:  Life alert? No  Use of a cane, walker or w/c? Yes  Grab bars in the bathroom? Yes  Shower chair or bench in shower? Yes  Elevated toilet seat or a handicapped toilet? No   TIMED UP AND GO:  Was the test performed? No .  Cognitive Function:     6CIT Screen 08/09/2021  What Year? 0 points  What month? 0 points  What time? 0 points  Count back from 20 0 points  Months in reverse 0 points  Repeat phrase 0 points  Total Score 0    Immunizations Immunization History  Administered Date(s) Administered   DTaP / HiB 11/09/2014   Influenza Split 11/19/2015   Influenza, Seasonal, Injecte, Preservative Fre 10/20/2014   Influenza,inj,Quad PF,6+ Mos 10/10/2016, 09/11/2017, 08/13/2018, 08/29/2019   Influenza-Unspecified 09/26/2017   Janssen (J&J) SARS-COV-2 Vaccination 03/15/2020    Meningococcal  Conjugate 11/09/2014, 01/05/2015   Pneumococcal Conjugate-13 11/09/2014   Pneumococcal Polysaccharide-23 05/02/2015, 05/23/2020   Tdap 12/08/2008   Zoster Recombinat (Shingrix) 08/31/2019, 12/26/2019    TDAP status: Due, Education has been provided regarding the importance of this vaccine. Advised may receive this vaccine at local pharmacy or Health Dept. Aware to provide a copy of the vaccination record if obtained from local pharmacy or Health Dept. Verbalized acceptance and understanding.  Flu Vaccine status: Due, Education has been provided regarding the importance of this vaccine. Advised may receive this vaccine at local pharmacy or Health Dept. Aware to provide a copy of the vaccination record if obtained from local pharmacy or Health Dept. Verbalized acceptance and understanding.  Pneumococcal vaccine status: Up to date  Covid-19 vaccine status: Completed vaccines  Qualifies for Shingles Vaccine? Yes   Zostavax completed Yes   Shingrix Completed?: Yes  Screening Tests Health Maintenance  Topic Date Due   COVID-19 Vaccine (2 - Janssen risk series) 04/12/2020   INFLUENZA VACCINE  07/08/2021   TETANUS/TDAP  07/08/2022 (Originally 12/08/2018)   MAMMOGRAM  04/10/2022   COLONOSCOPY (Pts 45-27yr Insurance coverage will need to be confirmed)  03/09/2023   DEXA SCAN  04/03/2023   Hepatitis C Screening  Completed   PNA vac Low Risk Adult  Completed   Zoster Vaccines- Shingrix  Completed   HPV VACCINES  Aged Out    Health Maintenance  Health Maintenance Due  Topic Date Due   COVID-19 Vaccine (2 - Janssen risk series) 04/12/2020   INFLUENZA VACCINE  07/08/2021    Colorectal cancer screening: Type of screening: Colonoscopy. Completed 03/08/13. Repeat every 10 years  Mammogram status: Completed 04/10/21. Repeat every year  Bone Density status: Completed 04/02/21. Results reflect: Bone density results: OSTEOPOROSIS. Repeat every 2 years.   Additional  Screening:  Hepatitis C Screening: Completed 05/15/17  Vision Screening: Recommended annual ophthalmology exams for early detection of glaucoma and other disorders of the eye. Is the patient up to date with their annual eye exam?  Yes  Who is the provider or what is the name of the office in which the patient attends annual eye exams? [facility] Dr If pt is not established with a provider, would they like to be referred to a provider to establish care? No .   Dental Screening: Recommended annual dental exams for proper oral hygiene  Community Resource Referral / Chronic Care Management: CRR required this visit?  No   CCM required this visit?  No      Plan:     I have personally reviewed and noted the following in the patient's chart:   Medical and social history Use of alcohol, tobacco or illicit drugs  Current medications and supplements including opioid prescriptions.  Functional ability and status Nutritional status Physical activity Advanced directives List of other physicians Hospitalizations, surgeries, and ER visits in previous 12 months Vitals Screenings to include cognitive, depression, and falls Referrals and appointments  In addition, I have reviewed and discussed with patient certain preventive protocols, quality metrics, and best practice recommendations. A written personalized care plan for preventive services as well as general preventive health recommendations were provided to patient.     TWillette Brace LPN   [id number]  Nurse Notes: None

## 2021-08-09 NOTE — Patient Instructions (Signed)
Ms. Kramar , Thank you for taking time to come for your Medicare Wellness Visit. I appreciate your ongoing commitment to your health goals. Please review the following plan we discussed and let me know if I can assist you in the future.   Screening recommendations/referrals: Colonoscopy: Done 03/08/13 repeat in 10 years  Mammogram: Completed 04/10/21 repeat every year  Bone Density: Done 04/02/21 repeat every 2 years Recommended yearly ophthalmology/optometry visit for glaucoma screening and checkup Recommended yearly dental visit for hygiene and checkup  Vaccinations: Influenza vaccine: Due Pneumococcal vaccine: Completed  Tdap vaccine: Due  Shingles vaccine: Completed 08/31/19 & 12/26/19   Covid-19:Completed 03/15/20  Advanced directives: Please bring a copy of your health care power of attorney and living will to the office at your convenience.  Conditions/risks identified: None at this time  Next appointment: Follow up in one year for your annual wellness visit    Preventive Care 65 Years and Older, Female Preventive care refers to lifestyle choices and visits with your health care provider that can promote health and wellness. What does preventive care include? A yearly physical exam. This is also called an annual well check. Dental exams once or twice a year. Routine eye exams. Ask your health care provider how often you should have your eyes checked. Personal lifestyle choices, including: Daily care of your teeth and gums. Regular physical activity. Eating a healthy diet. Avoiding tobacco and drug use. Limiting alcohol use. Practicing safe sex. Taking low-dose aspirin every day. Taking vitamin and mineral supplements as recommended by your health care provider. What happens during an annual well check? The services and screenings done by your health care provider during your annual well check will depend on your age, overall health, lifestyle risk factors, and family history  of disease. Counseling  Your health care provider may ask you questions about your: Alcohol use. Tobacco use. Drug use. Emotional well-being. Home and relationship well-being. Sexual activity. Eating habits. History of falls. Memory and ability to understand (cognition). Work and work Statistician. Reproductive health. Screening  You may have the following tests or measurements: Height, weight, and BMI. Blood pressure. Lipid and cholesterol levels. These may be checked every 5 years, or more frequently if you are over 67 years old. Skin check. Lung cancer screening. You may have this screening every year starting at age 20 if you have a 30-pack-year history of smoking and currently smoke or have quit within the past 15 years. Fecal occult blood test (FOBT) of the stool. You may have this test every year starting at age 71. Flexible sigmoidoscopy or colonoscopy. You may have a sigmoidoscopy every 5 years or a colonoscopy every 10 years starting at age 7. Hepatitis C blood test. Hepatitis B blood test. Sexually transmitted disease (STD) testing. Diabetes screening. This is done by checking your blood sugar (glucose) after you have not eaten for a while (fasting). You may have this done every 1-3 years. Bone density scan. This is done to screen for osteoporosis. You may have this done starting at age 9. Mammogram. This may be done every 1-2 years. Talk to your health care provider about how often you should have regular mammograms. Talk with your health care provider about your test results, treatment options, and if necessary, the need for more tests. Vaccines  Your health care provider may recommend certain vaccines, such as: Influenza vaccine. This is recommended every year. Tetanus, diphtheria, and acellular pertussis (Tdap, Td) vaccine. You may need a Td booster every 10 years.  Zoster vaccine. You may need this after age 67. Pneumococcal 13-valent conjugate (PCV13) vaccine. One  dose is recommended after age 29. Pneumococcal polysaccharide (PPSV23) vaccine. One dose is recommended after age 59. Talk to your health care provider about which screenings and vaccines you need and how often you need them. This information is not intended to replace advice given to you by your health care provider. Make sure you discuss any questions you have with your health care provider. Document Released: 12/21/2015 Document Revised: 08/13/2016 Document Reviewed: 09/25/2015 Elsevier Interactive Patient Education  2017 [employer] Prevention in the Home Falls can cause injuries. They can happen to people of all ages. There are many things you can do to make your home safe and to help prevent falls. What can I do on the outside of my home? Regularly fix the edges of walkways and driveways and fix any cracks. Remove anything that might make you trip as you walk through a door, such as a raised step or threshold. Trim any bushes or trees on the path to your home. Use bright outdoor lighting. Clear any walking paths of anything that might make someone trip, such as rocks or tools. Regularly check to see if handrails are loose or broken. Make sure that both sides of any steps have handrails. Any raised decks and porches should have guardrails on the edges. Have any leaves, snow, or ice cleared regularly. Use sand or salt on walking paths during winter. Clean up any spills in your garage right away. This includes oil or grease spills. What can I do in the bathroom? Use night lights. Install grab bars by the toilet and in the tub and shower. Do not use towel bars as grab bars. Use non-skid mats or decals in the tub or shower. If you need to sit down in the shower, use a plastic, non-slip stool. Keep the floor dry. Clean up any water that spills on the floor as soon as it happens. Remove soap buildup in the tub or shower regularly. Attach bath mats securely with double-sided  non-slip rug tape. Do not have throw rugs and other things on the floor that can make you trip. What can I do in the bedroom? Use night lights. Make sure that you have a light by your bed that is easy to reach. Do not use any sheets or blankets that are too big for your bed. They should not hang down onto the floor. Have a firm chair that has side arms. You can use this for support while you get dressed. Do not have throw rugs and other things on the floor that can make you trip. What can I do in the kitchen? Clean up any spills right away. Avoid walking on wet floors. Keep items that you use a lot in easy-to-reach places. If you need to reach something above you, use a strong step stool that has a grab bar. Keep electrical cords out of the way. Do not use floor polish or wax that makes floors slippery. If you must use wax, use non-skid floor wax. Do not have throw rugs and other things on the floor that can make you trip. What can I do with my stairs? Do not leave any items on the stairs. Make sure that there are handrails on both sides of the stairs and use them. Fix handrails that are broken or loose. Make sure that handrails are as long as the stairways. Check any carpeting to make sure that  it is firmly attached to the stairs. Fix any carpet that is loose or worn. Avoid having throw rugs at the top or bottom of the stairs. If you do have throw rugs, attach them to the floor with carpet tape. Make sure that you have a light switch at the top of the stairs and the bottom of the stairs. If you do not have them, ask someone to add them for you. What else can I do to help prevent falls? Wear shoes that: Do not have high heels. Have rubber bottoms. Are comfortable and fit you well. Are closed at the toe. Do not wear sandals. If you use a stepladder: Make sure that it is fully opened. Do not climb a closed stepladder. Make sure that both sides of the stepladder are locked into place. Ask  someone to hold it for you, if possible. Clearly mark and make sure that you can see: Any grab bars or handrails. First and last steps. Where the edge of each step is. Use tools that help you move around (mobility aids) if they are needed. These include: Canes. Walkers. Scooters. Crutches. Turn on the lights when you go into a dark area. Replace any light bulbs as soon as they burn out. Set up your furniture so you have a clear path. Avoid moving your furniture around. If any of your floors are uneven, fix them. If there are any pets around you, be aware of where they are. Review your medicines with your doctor. Some medicines can make you feel dizzy. This can increase your chance of falling. Ask your doctor what other things that you can do to help prevent falls. This information is not intended to replace advice given to you by your health care provider. Make sure you discuss any questions you have with your health care provider. Document Released: 09/20/2009 Document Revised: 05/01/2016 Document Reviewed: 12/29/2014 Elsevier Interactive Patient Education  2017 [employer].

## 2021-08-21 ENCOUNTER — Other Ambulatory Visit: Payer: Self-pay | Admitting: Family Medicine

## 2021-08-22 ENCOUNTER — Encounter: Payer: Self-pay | Admitting: *Deleted

## 2021-08-22 DIAGNOSIS — Z87891 Personal history of nicotine dependence: Secondary | ICD-10-CM

## 2021-08-27 ENCOUNTER — Telehealth (HOSPITAL_BASED_OUTPATIENT_CLINIC_OR_DEPARTMENT_OTHER): Payer: Self-pay

## 2021-09-01 NOTE — Progress Notes (Signed)
Result Letter Sent by Doroteo Glassman RN 12 month follow up LDCT ordered Fax results report  sent to PCP  + CAD, needs to follow up with PCP

## 2021-12-12 ENCOUNTER — Other Ambulatory Visit: Payer: Self-pay | Admitting: Family Medicine

## 2022-01-30 ENCOUNTER — Encounter: Payer: Medicare Other | Admitting: Family Medicine

## 2022-02-05 DIAGNOSIS — H43812 Vitreous degeneration, left eye: Secondary | ICD-10-CM | POA: Diagnosis not present

## 2022-03-13 ENCOUNTER — Other Ambulatory Visit: Payer: Self-pay | Admitting: Family Medicine

## 2022-03-26 ENCOUNTER — Other Ambulatory Visit: Payer: Self-pay | Admitting: Family Medicine

## 2022-03-31 ENCOUNTER — Encounter: Payer: Medicare Other | Admitting: Family Medicine

## 2022-05-23 ENCOUNTER — Other Ambulatory Visit: Payer: Self-pay | Admitting: Family Medicine

## 2022-07-04 ENCOUNTER — Other Ambulatory Visit: Payer: Self-pay | Admitting: Family Medicine

## 2022-07-18 ENCOUNTER — Ambulatory Visit (INDEPENDENT_AMBULATORY_CARE_PROVIDER_SITE_OTHER): Payer: Medicare Other | Admitting: Family Medicine

## 2022-07-18 ENCOUNTER — Encounter: Payer: Self-pay | Admitting: Family Medicine

## 2022-07-18 VITALS — BP 162/86 | HR 84 | Temp 98.3°F | Ht 68.0 in | Wt 126.0 lb

## 2022-07-18 DIAGNOSIS — D508 Other iron deficiency anemias: Secondary | ICD-10-CM

## 2022-07-18 DIAGNOSIS — M810 Age-related osteoporosis without current pathological fracture: Secondary | ICD-10-CM

## 2022-07-18 DIAGNOSIS — R030 Elevated blood-pressure reading, without diagnosis of hypertension: Secondary | ICD-10-CM | POA: Diagnosis not present

## 2022-07-18 DIAGNOSIS — Z1231 Encounter for screening mammogram for malignant neoplasm of breast: Secondary | ICD-10-CM | POA: Diagnosis not present

## 2022-07-18 DIAGNOSIS — D75838 Other thrombocytosis: Secondary | ICD-10-CM | POA: Diagnosis not present

## 2022-07-18 DIAGNOSIS — G112 Late-onset cerebellar ataxia: Secondary | ICD-10-CM | POA: Diagnosis not present

## 2022-07-18 DIAGNOSIS — N3281 Overactive bladder: Secondary | ICD-10-CM

## 2022-07-18 DIAGNOSIS — Z9081 Acquired absence of spleen: Secondary | ICD-10-CM

## 2022-07-18 DIAGNOSIS — Z0000 Encounter for general adult medical examination without abnormal findings: Secondary | ICD-10-CM

## 2022-07-18 DIAGNOSIS — Z79899 Other long term (current) drug therapy: Secondary | ICD-10-CM

## 2022-07-18 DIAGNOSIS — F339 Major depressive disorder, recurrent, unspecified: Secondary | ICD-10-CM | POA: Diagnosis not present

## 2022-07-18 DIAGNOSIS — J431 Panlobular emphysema: Secondary | ICD-10-CM

## 2022-07-18 MED ORDER — OXYBUTYNIN CHLORIDE 5 MG PO TABS: 5.0000 mg | ORAL_TABLET | Freq: Two times a day (BID) | ORAL | 3 refills | Status: DC

## 2022-07-18 MED ORDER — VENLAFAXINE HCL ER 150 MG PO CP24: ORAL_CAPSULE | ORAL | 3 refills | Status: DC

## 2022-07-18 MED ORDER — ATORVASTATIN CALCIUM 10 MG PO TABS: 10.0000 mg | ORAL_TABLET | Freq: Every day | ORAL | 3 refills | Status: DC

## 2022-07-18 MED ORDER — VENLAFAXINE HCL ER 75 MG PO CP24: ORAL_CAPSULE | ORAL | 3 refills | Status: DC

## 2022-07-18 MED ORDER — ALENDRONATE SODIUM 70 MG PO TABS: 70.0000 mg | ORAL_TABLET | ORAL | 11 refills | Status: DC

## 2022-07-18 MED ORDER — BEVESPI AEROSPHERE 9-4.8 MCG/ACT IN AERO: 2.0000 | INHALATION_SPRAY | Freq: Two times a day (BID) | RESPIRATORY_TRACT | 11 refills | Status: DC

## 2022-07-18 NOTE — Progress Notes (Signed)
Subjective  Chief Complaint  Patient presents with   Annual Exam    Pt here for Annual exam and is currently fasting     HPI: Sherry Zimmerman is a 66 y.o. female who presents to [facility] today for a Female Wellness Visit. She also has the concerns and/or needs as listed above in the chief complaint. These will be addressed in addition to the Health Maintenance Visit.   Wellness Visit: annual visit with health maintenance review and exam without Pap  HM: overdue for mammogram. Colonoscopy due next year.  Chronic disease f/u and/or acute problem visit: (deemed necessary to be done in addition to the wellness visit): Reviewed multiple medical problems as listed below.  FCA: with falls.  Osteoporosis on fosamax but stopped a few months ago due to constipation. Also on calcium  Emphysema doing well on brevespi. Remains smoke free Depression is controlled on effexor high dose Taking lipitor No h/o HTN: today elevated. No cp or sob. Last year readings were borderline. + FH. Low salt diet. Thin H/o iron deficiency.   Assessment  1. Annual physical exam   2. Encounter for screening mammogram for breast cancer   3. Familial cerebellar ataxia (HCC)   4. Panlobular emphysema ([place])   5. Major depression, recurrent, chronic ([place])   6. On statin therapy due to risk of future cardiovascular event   7. Osteoporosis without current pathological fracture, unspecified osteoporosis type   8. S/P splenectomy   9. OAB (overactive bladder)   10. 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 Elevated blood pressure reading without diagnosis of hypertension   12. Acquired iron deficiency anemia due to decreased absorption      Plan  Female Wellness Visit: Age appropriate Health Maintenance and Prevention measures were discussed with patient. Included topics are cancer screening recommendations, ways to keep healthy (see AVS) including dietary and exercise recommendations,  regular eye and dental care, use of seat belts, and avoidance of moderate alcohol use and tobacco use.  BMI: discussed patient's BMI and encouraged positive lifestyle modifications to help get to or maintain a target BMI. HM needs and immunizations were addressed and ordered. See below for orders. See HM and immunization section for updates. Routine labs and screening tests ordered including cmp, cbc and lipids where appropriate. Discussed recommendations regarding Vit D and calcium supplementation (see AVS)  Chronic disease management visit and/or acute problem visit: FCA: fall prevention discussed.  Osteoporosis: to restart fosamax. May hold calcium for a month. Or change to calcium citrate Continue effexor 225 daily. We discussed possibly decreasing dose but has failed this in the past and remains with stress.  Recheck lipids on statin. Lipitor 10 Oab on ditropan 5 bid and controlled.  Recommend home monitoring of bp. Will recheck in 3 months and start meds if remains elevated.  Recheck iron levels. Continue brevespi for copd. Schedule for annual lung cancer screen.   Follow up: 3 mo to recheck bp  Orders Placed This Encounter  Procedures   MM DIGITAL SCREENING BILATERAL   CBC with Differential/Platelet   Comprehensive metabolic panel   Lipid panel   TSH   Iron, TIBC and Ferritin Panel   Meds ordered this encounter  Medications   atorvastatin (LIPITOR) 10 MG tablet    Sig: Take 1 tablet (10 mg total) by mouth at bedtime.    Dispense:  90 tablet    Refill:  3   Glycopyrrolate-Formoterol (BEVESPI AEROSPHERE) 9-4.8 MCG/ACT AERO  Sig: Inhale 2 puffs into the lungs 2 (two) times daily.    Dispense:  10.7 g    Refill:  11   oxybutynin (DITROPAN) 5 MG tablet    Sig: Take 1 tablet (5 mg total) by mouth 2 (two) times daily.    Dispense:  180 tablet    Refill:  3   venlafaxine XR (EFFEXOR-XR) 150 MG 24 hr capsule    Sig: TAKE 1 CAPSULE BY MOUTH AT BEDTIME **TAKE  WITH  '75MG'$    CAPSULES  FOR  '225MG'$   TOTAL  DAILY  DOSE    Dispense:  90 capsule    Refill:  3   venlafaxine XR (EFFEXOR-XR) 75 MG 24 hr capsule    Sig: TAKE 1 CAPSULE BY MOUTH ONCE DAILY **TAKE  WITH  '150MG'$   CAPSULES  FOR  '225MG'$   TOTAL  DAILY  DOSE    Dispense:  90 capsule    Refill:  3   alendronate (FOSAMAX) 70 MG tablet    Sig: Take 1 tablet (70 mg total) by mouth every 7 (seven) days. Take with a full glass of water on an empty stomach. Sit upright for 30 minutes    Dispense:  4 tablet    Refill:  11      Body mass index is 19.16 kg/m. Wt Readings from Last 3 Encounters:  07/18/22 126 lb (57.2 kg)  07/08/21 128 lb 12.8 oz (58.4 kg)  04/04/21 127 lb 6.4 oz (57.8 kg)     Patient Active Problem List   Diagnosis Date Noted   Osteoporosis 07/08/2021    Priority: High    DEXA 03/2021: lowest T = -3.3, L femur; forearm -2.2; started tx 07/2021    Former smoker 07/08/2021    Priority: High    Quit 03/2021; long term smoker    On statin therapy due to risk of future cardiovascular event 11/18/2019    Priority: High    LDL 115, ASCVD 8.4 2018: started lipitor.    Major depression, recurrent, chronic ([place]) 08/13/2018    Priority: High    Managed with effexor, chronically    Emphysema of lung ([place]) 05/13/2018    Priority: High    Chest ct for lung ca screening 2021    Familial cerebellar ataxia ([place]) 04/19/2014    Priority: High    Overview:  Followed by neurology, Dr. Maisie Fus in [place]; on disability    Migraine without aura and without status migrainosus, not intractable 01/10/2019    Priority: Medium    Acquired iron deficiency anemia due to decreased absorption 06/18/2018    Priority: Medium    OAB (overactive bladder) 11/09/2014    Priority: Medium    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    Priority: Medium     Overview:  Emergent laparaotomy due ruptured spleen after fall 10/2014. Pneumovax, hib and meningio vaccines given 11/2014, needs prevnar 01/2015    Thrombocytosis after  splenectomy 11/18/2019    Priority: Low   Raynaud disease 05/13/2018    Priority: Low    feet    AR (allergic rhinitis) 01/08/2018    Priority: Low   Health Maintenance  Topic Date Due   MAMMOGRAM  04/10/2022   INFLUENZA VACCINE  07/08/2022   COVID-19 Vaccine (2 - Janssen risk series) 08/03/2022 (Originally 04/12/2020)   COLONOSCOPY (Pts 45-53yr Insurance coverage will need to be confirmed)  03/09/2023   DEXA SCAN  04/03/2023   Pneumonia Vaccine 67 Years old  Completed   Hepatitis C Screening  Completed  Zoster Vaccines- Shingrix  Completed   HPV VACCINES  Aged Out   TETANUS/TDAP  Discontinued   Immunization History  Administered Date(s) Administered   DTaP / HiB 11/09/2014   HiB (PRP-T) 11/09/2014   Influenza Split 11/19/2015   Influenza, Seasonal, Injecte, Preservative Fre 10/20/2014   Influenza,inj,Quad PF,6+ Mos 10/10/2016, 09/11/2017, 08/13/2018, 08/29/2019   Influenza-Unspecified 09/26/2017, 10/09/2020   Janssen (J&J) SARS-COV-2 Vaccination 03/15/2020   Meningococcal Conjugate 11/09/2014, 01/05/2015   Pneumococcal Conjugate-13 11/09/2014   Pneumococcal Polysaccharide-23 05/02/2015, 05/23/2020   Tdap 12/08/2008   Zoster Recombinat (Shingrix) 08/31/2019, 12/26/2019   We updated and reviewed the patient's past history in detail and it is documented below. Allergies: Patient is allergic to morphine and related, sulfa antibiotics, and sulfacetamide sodium. 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 Past Surgical History Patient  has a past surgical history that includes  Abdominal hysterectomy; Wrist fracture surgery (Right); Splenectomy, total (N/A, 10/26/2014); and Cataract extraction (Bilateral, 01/17/2020). Family History: Patient family history includes Ataxia in her mother; Cancer in her father; Diabetes in her mother; Heart disease in her mother; Stroke in her mother. Social History:  Patient  reports that she has quit smoking. Her smoking use included cigarettes. She has a 50.00 pack-year smoking history. She has never used smokeless tobacco. She reports current alcohol use. She reports that she does not use drugs.  Review of Systems: Constitutional: negative for fever or malaise Ophthalmic: negative for photophobia, double vision or loss of vision Cardiovascular: negative for chest pain, dyspnea on exertion, or new LE swelling Respiratory: negative for SOB or persistent cough Gastrointestinal: negative for abdominal pain, change in bowel habits or melena Genitourinary: negative for dysuria or gross hematuria, no abnormal uterine bleeding or disharge Musculoskeletal: negative for new gait disturbance or muscular weakness Integumentary: negative for new or persistent rashes, no breast lumps Neurological: negative for TIA or stroke symptoms Psychiatric: negative for SI or delusions Allergic/Immunologic: negative for hives  Patient Care Team    Relationship Specialty Notifications Start End  Leamon Arnt, MD PCP - General Family Medicine  01/08/18   Leland Johns., MD Referring Physician Psychiatry  12/29/18     Objective  Vitals: BP (!) 162/86   Pulse 84   Temp 98.3 F (36.8 C)   Ht '5\' 8"'$  (1.727 m)   Wt 126 lb (57.2 kg)   SpO2 93%   BMI 19.16 kg/m  General:  Well developed, well nourished, no acute distress  Psych:  Alert and orientedx3,normal mood and affect HEENT:  Normocephalic, atraumatic, non-icteric sclera,  supple neck without adenopathy, mass or thyromegaly Cardiovascular:  Normal S1, S2, RRR without gallop, rub or  murmur Respiratory:  Good breath sounds bilaterally, CTAB with normal respiratory effort Gastrointestinal: normal bowel sounds, soft, non-tender, no noted masses. No HSM MSK: no deformities, contusions. Joints are without erythema or swelling.  Skin:  Warm, no rashes or suspicious lesions noted Neurologic:    Mental status is normal.   Commons side effects, risks, benefits, and alternatives for medications and treatment plan prescribed today were discussed, and the patient expressed understanding of the given instructions. Patient is instructed to call or message via MyChart if he/she has any questions or concerns  regarding our treatment plan. No barriers to understanding were identified. We discussed Red Flag symptoms and signs in detail. Patient expressed understanding regarding what to do in case of urgent or emergency type symptoms.  Medication list was reconciled, printed and provided to the patient in AVS. Patient instructions and summary information was reviewed with the patient as documented in the AVS. This note was prepared with assistance of Dragon voice recognition software. Occasional wrong-word or sound-a-like substitutions may have occurred due to the inherent limitations of voice recognition software  This visit occurred during the SARS-CoV-2 public health emergency.  Safety protocols were in place, including screening questions prior to the visit, additional usage of staff PPE, and extensive cleaning of exam room while observing appropriate contact time as indicated for disinfecting solutions.

## 2022-07-18 NOTE — Patient Instructions (Signed)
Please return in 3 months to recheck blood pressure and osteoporosis medication.   Please start checking your blood pressures daily at home and bring in the log for my review. Goal BP is < 140/90.   I will release your lab results to you on your MyChart account with further instructions. You may see the results before I do, but when I review them I will send you a message with my report or have my assistant call you if things need to be discussed. Please reply to my message with any questions. Thank you!   Please schedule your mammogram. It is overdue.   If you have any questions or concerns, please don't hesitate to send me a message via MyChart or call the office at [phone]. Thank you for visiting with [place] today! It's our pleasure caring for you.   Please do these things to maintain good health!  Exercise at least 30-45 minutes a day,  4-5 days a week.  Eat a low-fat diet with lots of fruits and vegetables, up to 7-9 servings per day. Drink plenty of water daily. Try to drink 8 8oz glasses per day. Seatbelts can save your life. Always wear your seatbelt. Place Smoke Detectors on every level of your home and check batteries every year. Schedule an appointment with an eye doctor for an eye exam every 1-2 years Safe sex - use condoms to protect yourself from STDs if you could be exposed to these types of infections. Use birth control if you do not want to become pregnant and are sexually active. Avoid heavy alcohol use. If you drink, keep it to less than 2 drinks/day and not every day. Story.  Choose someone you trust that could speak for you if you became unable to speak for yourself. Depression is common in our stressful world.If you're feeling down or losing interest in things you normally enjoy, please come in for a visit. If anyone is threatening or hurting you, please get help. Physical or Emotional Violence is never OK.

## 2022-07-21 ENCOUNTER — Encounter: Payer: Self-pay | Admitting: Family Medicine

## 2022-07-23 MED ORDER — VALACYCLOVIR HCL 1 G PO TABS: 2000.0000 mg | ORAL_TABLET | Freq: Two times a day (BID) | ORAL | 2 refills | Status: DC

## 2022-07-30 DIAGNOSIS — Z1231 Encounter for screening mammogram for malignant neoplasm of breast: Secondary | ICD-10-CM | POA: Diagnosis not present

## 2022-07-30 LAB — HM MAMMOGRAPHY

## 2022-08-07 ENCOUNTER — Ambulatory Visit (HOSPITAL_BASED_OUTPATIENT_CLINIC_OR_DEPARTMENT_OTHER)
Admission: RE | Admit: 2022-08-07 | Discharge: 2022-08-07 | Disposition: A | Discharge status: Home / Self Care | Payer: Medicare Other | Source: Ambulatory Visit | Attending: Acute Care | Admitting: Acute Care

## 2022-08-07 DIAGNOSIS — Z87891 Personal history of nicotine dependence: Secondary | ICD-10-CM | POA: Insufficient documentation

## 2022-08-08 ENCOUNTER — Other Ambulatory Visit: Payer: Self-pay | Admitting: Acute Care

## 2022-08-08 DIAGNOSIS — Z87891 Personal history of nicotine dependence: Secondary | ICD-10-CM

## 2022-08-08 DIAGNOSIS — Z122 Encounter for screening for malignant neoplasm of respiratory organs: Secondary | ICD-10-CM

## 2022-08-22 ENCOUNTER — Ambulatory Visit (INDEPENDENT_AMBULATORY_CARE_PROVIDER_SITE_OTHER): Payer: Medicare Other

## 2022-08-22 DIAGNOSIS — Z0000 Encounter for general adult medical examination without abnormal findings: Secondary | ICD-10-CM | POA: Diagnosis not present

## 2022-08-22 NOTE — Progress Notes (Signed)
Virtual Visit via Telephone Note  I connected with  Ronnica Denzler on 08/22/22 at  1:00 PM EDT by telephone and verified that I am speaking with the correct person using two identifiers.  Medicare Annual Wellness visit completed telephonically due to Covid-19 pandemic.   Persons participating in this call: This Health Coach and this patient.  Location: Patient: home Provider: office    I discussed the limitations, risks, security and privacy concerns of performing an evaluation and management service by telephone and the availability of in person appointments. The patient expressed understanding and agreed to proceed.  Unable to perform video visit due to video visit attempted and failed and/or patient does not have video capability.   Some vital signs may be absent or patient reported.   Willette Brace, LPN   Subjective:   Tashonda Pinkus is a 66 y.o. female who presents for Medicare Annual (Subsequent) preventive examination.  Review of Systems     Cardiac Risk Factors include: advanced age (>83mn, >>10women);sedentary lifestyle     Objective:    There were no vitals filed for this visit. There is no height or weight on file to calculate BMI.     08/22/2022    1:06 PM 08/09/2021    2:34 PM 11/18/2019   12:37 PM 10/28/2018    2:31 PM 07/28/2018    2:57 PM 06/18/2018    2:19 PM 12/22/2017   10:55 AM  Advanced Directives  Does Patient Have a Medical Advance Directive? No No No No No No No  Would patient like information on creating a medical advance directive? Yes (MAU/Ambulatory/Procedural Areas - Information given) Yes (MAU/Ambulatory/Procedural Areas - Information given) Yes (MAU/Ambulatory/Procedural Areas - Information given)  No - Patient declined  No - Patient declined    Current Medications (verified) Outpatient Encounter Medications as of 08/22/2022  Medication Sig   albuterol (VENTOLIN HFA) 108 (90 Base) MCG/ACT inhaler INHALE 2 PUFFS BY MOUTH EVERY 4 HOURS AS  NEEDED FOR WHEEZING FOR SHORTNESS OF BREATH   alendronate (FOSAMAX) 70 MG tablet Take 1 tablet (70 mg total) by mouth every 7 (seven) days. Take with a full glass of water on an empty stomach. Sit upright for 30 minutes   atorvastatin (LIPITOR) 10 MG tablet Take 1 tablet (10 mg total) by mouth at bedtime.   Glycopyrrolate-Formoterol (BEVESPI AEROSPHERE) 9-4.8 MCG/ACT AERO Inhale 2 puffs into the lungs 2 (two) times daily.   oxybutynin (DITROPAN) 5 MG tablet Take 1 tablet (5 mg total) by mouth 2 (two) times daily.   rizatriptan (MAXALT) 10 MG tablet Take 10 mg by mouth as needed for migraine. May repeat in 2 hours if needed   valACYclovir (VALTREX) 1000 MG tablet Take 2 tablets (2,000 mg total) by mouth 2 (two) times daily. Once, as needed for cold sores   venlafaxine XR (EFFEXOR-XR) 150 MG 24 hr capsule TAKE 1 CAPSULE BY MOUTH AT BEDTIME **TAKE  WITH  '75MG'$   CAPSULES  FOR  '225MG'$   TOTAL  DAILY  DOSE   venlafaxine XR (EFFEXOR-XR) 75 MG 24 hr capsule TAKE 1 CAPSULE BY MOUTH ONCE DAILY **TAKE  WITH  '150MG'$   CAPSULES  FOR  '225MG'$   TOTAL  DAILY  DOSE   No facility-administered encounter medications on file as of 08/22/2022.    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   Osteoporosis 07/08/2021   DEXA 03/2021: lowest T = -3.3, L femur; forearm -2.2; started tx 07/2021   Raynaud disease 05/13/2018   feet   Splenic rupture 10/26/14   Past Surgical History:  Procedure Laterality Date   ABDOMINAL HYSTERECTOMY     CATARACT  EXTRACTION Bilateral 01/17/2020   SPLENECTOMY, TOTAL N/A 10/26/2014   Procedure: SPLENECTOMY;  Surgeon: Georganna Skeans, MD;  Location: [facility];  Service: General;  Laterality: N/A;   WRIST FRACTURE SURGERY Right    Family History  Problem Relation Age of Onset   Ataxia Mother    Stroke Mother    Heart disease Mother    Diabetes Mother    Cancer Father    Social History   Socioeconomic History   Marital status: Divorced    Spouse name: Not on file   Number of children: Not on file   Years of education: Not on file   Highest education level: Not on file  Occupational History   Occupation: disabled  Tobacco Use   Smoking status: Former    Packs/day: 1.00    Years: 50.00    Total pack years: 50.00    Types: Cigarettes   Smokeless tobacco: Never   Tobacco comments:    Quit on easter of 2022  Vaping Use   Vaping Use: Never used  Substance and Sexual Activity   Alcohol use: Yes    Comment: socially   Drug use: No   Sexual activity: Never  Other Topics Concern   Not on file  Social History Narrative   Not on file   Social Determinants of Health   Financial Resource Strain: Low Risk  (08/22/2022)   Overall Financial Resource Strain (CARDIA)    Difficulty of Paying Living Expenses: Not hard at all  Food Insecurity: No Food Insecurity (08/22/2022)   Hunger Vital Sign    Worried About Running Out of Food in the Last Year: Never true    [place] in the Last Year: Never true  Transportation Needs: No Transportation Needs (08/22/2022)   PRAPARE - Hydrologist (Medical): No    Lack of Transportation (Non-Medical): No  Physical Activity: Inactive (08/22/2022)   Exercise Vital Sign    Days of Exercise per Week: 0 days    Minutes of Exercise per Session: 0 min  Stress: No Stress Concern Present (08/22/2022)   [place]    Feeling of Stress : Not at all  Social Connections:  Moderately Isolated (08/22/2022)   Social Connection and Isolation Panel [NHANES]    Frequency of Communication with Friends and Family: More than three times a week    Frequency of Social Gatherings with Friends and Family: Once a week    Attends Religious Services: Never    Marine scientist or Organizations: No    Attends Archivist Meetings: Never    Marital Status: Living with partner    Tobacco Counseling Counseling given: Not Answered Tobacco comments: Quit on easter of 2022   Clinical Intake:  Pre-visit preparation completed: Yes  Pain : No/denies pain  BMI - recorded: 19.16 Nutritional Status: BMI of 19-24  Normal Nutritional Risks: None Diabetes: No  How often do you need to have someone help you when you read instructions, pamphlets, or other written materials from your doctor or pharmacy?: 1 - Never  Diabetic?no  Interpreter Needed?: No  Information entered by :: Charlott Rakes, LPN   Activities of Daily Living    08/22/2022    1:10 PM  In your present state of health, do you have any difficulty performing the following activities:  Hearing? 0  Vision? 0  Difficulty concentrating or making decisions? 0  Walking or climbing stairs? 0  Dressing or bathing? 0  Doing errands, shopping? 0  Preparing Food and eating ? N  Using the Toilet? N  In the past six months, have you accidently leaked urine? N  Do you have problems with loss of bowel control? N  Managing your Medications? N  Managing your Finances? N  Housekeeping or managing your Housekeeping? N    Patient Care Team: Leamon Arnt, MD as PCP - General (Family Medicine) Leland Johns., MD as Referring Physician (Psychiatry)  Indicate any recent Medical Services you may have received from other than [facility] providers in the past year (date may be approximate).     Assessment:   This is a routine wellness examination for Lameisha.  Hearing/Vision screen Hearing Screening -  Comments:: Pt denies any hearing issue Vision Screening - Comments:: Pt follows upwith [facility] Dr Kenton Kingfisher in [place]   Dietary issues and exercise activities discussed: Current Exercise Habits: The patient does not participate in regular exercise at present, Exercise limited by: Other - see comments (limited mobilty)   Goals Addressed             This Visit's Progress    Patient Stated       None at this time        Depression Screen    08/22/2022    1:07 PM 07/18/2022    9:46 AM 08/09/2021    2:33 PM 04/04/2021   11:08 AM 01/28/2021   10:48 AM 05/23/2020    1:41 PM 12/07/2019   10:09 AM  PHQ 2/9 Scores  PHQ - 2 Score 0 0 0 0 0 0 0  PHQ- 9 Score      0 6    Fall Risk    08/22/2022    1:09 PM 07/18/2022    9:51 AM 08/09/2021    2:35 PM 01/28/2021   10:48 AM 12/07/2019   10:11 AM  Fall Risk   Falls in the past year? 1 1 0 1 1  Number falls in past yr: 0 1 0 1 1  Injury with Fall? 0 0 0 0 1  Risk for fall due to : Impaired balance/gait;Impaired mobility;History of fall(s);Impaired vision History of fall(s) Impaired vision;Impaired mobility;Impaired balance/gait History of fall(s);Impaired balance/gait;Impaired mobility History of fall(s);Impaired mobility;Impaired balance/gait  Follow up Falls prevention discussed Falls evaluation completed Falls prevention discussed  Education provided;Falls evaluation completed    FALL RISK PREVENTION PERTAINING TO THE HOME:  Any stairs in or around the home? No  If so, are there any without handrails? No  Home free of loose throw rugs in walkways, pet beds, electrical cords, etc? Yes  Adequate lighting in your home to reduce risk of falls? Yes   ASSISTIVE DEVICES UTILIZED TO PREVENT FALLS:  Life alert? No  Use of a cane, walker or w/c? Yes  Grab bars in the bathroom?  Yes  Shower chair or bench in shower? Yes  Elevated toilet seat or a handicapped toilet? No   TIMED UP AND GO:  Was the test performed? No .   Cognitive  Function:        08/22/2022    1:10 PM 08/09/2021    2:37 PM  6CIT Screen  What Year? 0 points 0 points  What month? 0 points 0 points  What time? 0 points 0 points  Count back from 20 0 points 0 points  Months in reverse 0 points 0 points  Repeat phrase 0 points 0 points  Total Score 0 points 0 points    Immunizations Immunization History  Administered Date(s) Administered   DTaP / HiB 11/09/2014   HIB (PRP-T) 11/09/2014   Influenza Split 11/19/2015   Influenza, Seasonal, Injecte, Preservative Fre 10/20/2014   Influenza,inj,Quad PF,6+ Mos 10/10/2016, 09/11/2017, 08/13/2018, 08/29/2019   Influenza-Unspecified 09/26/2017, 10/09/2020   Janssen (J&J) SARS-COV-2 Vaccination 03/15/2020   Meningococcal Conjugate 11/09/2014, 01/05/2015   Pneumococcal Conjugate-13 11/09/2014   Pneumococcal Polysaccharide-23 05/02/2015, 05/23/2020   Tdap 12/08/2008   Zoster Recombinat (Shingrix) 08/31/2019, 12/26/2019    Tdap discontinued   Flu Vaccine status: Due, Education has been provided regarding the importance of this vaccine. Advised may receive this vaccine at local pharmacy or Health Dept. Aware to provide a copy of the vaccination record if obtained from local pharmacy or Health Dept. Verbalized acceptance and understanding.  Pneumococcal vaccine status: Up to date  Covid-19 vaccine status: Completed vaccines  Qualifies for Shingles Vaccine? Yes   Zostavax completed Yes   Shingrix Completed?: Yes  Screening Tests Health Maintenance  Topic Date Due   COVID-19 Vaccine (2 - Janssen risk series) 04/12/2020   INFLUENZA VACCINE  07/08/2022   COLONOSCOPY (Pts 45-66yr Insurance coverage will need to be confirmed)  03/09/2023   DEXA SCAN  04/03/2023   MAMMOGRAM  07/31/2023   Pneumonia Vaccine 67 Years old  Completed   Hepatitis C Screening  Completed   Zoster Vaccines- Shingrix  Completed   HPV VACCINES  Aged Out   TETANUS/TDAP  Discontinued    Health Maintenance  Health  Maintenance Due  Topic Date Due   COVID-19 Vaccine (2 - Janssen risk series) 04/12/2020   INFLUENZA VACCINE  07/08/2022    Colorectal cancer screening: Type of screening: Colonoscopy. Completed 03/08/13. Repeat every 10 years  Mammogram status: Completed 07/30/22. Repeat every year  Bone Density status: Completed 04/02/21. Results reflect: Bone density results: OSTEOPOROSIS. Repeat every 2 years.  Additional Screening:  Hepatitis C Screening: Completed 05/15/17  Vision Screening: Recommended annual ophthalmology exams for early detection of glaucoma and other disorders of the eye. Is the patient up to date with their annual eye exam?  Yes  Who is the provider or what is the name of the office in which the patient attends annual eye exams? Dr hKenton Kingfisher If pt is not established with a provider, would they like to be referred to a provider to establish care? No .   Dental Screening: Recommended annual dental exams for proper oral hygiene  Community Resource Referral / Chronic Care Management: CRR required this visit?  No   CCM required this visit?  No      Plan:     I have personally reviewed and noted the following in the patient's chart:   Medical and social history Use of alcohol, tobacco or illicit drugs  Current medications and supplements including opioid prescriptions. Patient is not currently taking opioid prescriptions.  Functional ability and status Nutritional status Physical activity Advanced directives List of other physicians Hospitalizations, surgeries, and ER visits in previous 12 months Vitals Screenings to include cognitive, depression, and falls Referrals and appointments  In addition, I have reviewed and discussed with patient certain preventive protocols, quality metrics, and best practice recommendations. A written personalized care plan for preventive services as well as general preventive health recommendations were provided to patient.     Willette Brace, LPN   01/18/1551   Nurse Notes: none

## 2022-08-22 NOTE — Patient Instructions (Signed)
Sherry Zimmerman , Thank you for taking time to come for your Medicare Wellness Visit. I appreciate your ongoing commitment to your health goals. Please review the following plan we discussed and let me know if I can assist you in the future.   Screening recommendations/referrals: Colonoscopy: done 03/08/13 repeat every 10 years  Mammogram: done 07/30/22 repeat every year  Bone Density: done 04/02/21 repeat every 2 years  Recommended yearly ophthalmology/optometry visit for glaucoma screening and checkup Recommended yearly dental visit for hygiene and checkup  Vaccinations: Influenza vaccine: due and discussed  Pneumococcal vaccine: Up to date Tdap vaccine: due  Shingles vaccine: completed   08/31/19, 12/26/19 Covid-19:completed 03/15/20  Advanced directives: Advance directive discussed with you today. I have provided a copy for you to complete at home and have notarized. Once this is complete please bring a copy in to our office so we can scan it into your chart.  Conditions/risks identified: none at this time   Next appointment: Follow up in one year for your annual wellness visit    Preventive Care 65 Years and Older, Female Preventive care refers to lifestyle choices and visits with your health care provider that can promote health and wellness. What does preventive care include? A yearly physical exam. This is also called an annual well check. Dental exams once or twice a year. Routine eye exams. Ask your health care provider how often you should have your eyes checked. Personal lifestyle choices, including: Daily care of your teeth and gums. Regular physical activity. Eating a healthy diet. Avoiding tobacco and drug use. Limiting alcohol use. Practicing safe sex. Taking low-dose aspirin every day. Taking vitamin and mineral supplements as recommended by your health care provider. What happens during an annual well check? The services and screenings done by your health care provider  during your annual well check will depend on your age, overall health, lifestyle risk factors, and family history of disease. Counseling  Your health care provider may ask you questions about your: Alcohol use. Tobacco use. Drug use. Emotional well-being. Home and relationship well-being. Sexual activity. Eating habits. History of falls. Memory and ability to understand (cognition). Work and work Statistician. Reproductive health. Screening  You may have the following tests or measurements: Height, weight, and BMI. Blood pressure. Lipid and cholesterol levels. These may be checked every 5 years, or more frequently if you are over 32 years old. Skin check. Lung cancer screening. You may have this screening every year starting at age 51 if you have a 30-pack-year history of smoking and currently smoke or have quit within the past 15 years. Fecal occult blood test (FOBT) of the stool. You may have this test every year starting at age 54. Flexible sigmoidoscopy or colonoscopy. You may have a sigmoidoscopy every 5 years or a colonoscopy every 10 years starting at age 34. Hepatitis C blood test. Hepatitis B blood test. Sexually transmitted disease (STD) testing. Diabetes screening. This is done by checking your blood sugar (glucose) after you have not eaten for a while (fasting). You may have this done every 1-3 years. Bone density scan. This is done to screen for osteoporosis. You may have this done starting at age 61. Mammogram. This may be done every 1-2 years. Talk to your health care provider about how often you should have regular mammograms. Talk with your health care provider about your test results, treatment options, and if necessary, the need for more tests. Vaccines  Your health care provider may recommend certain vaccines, such  as: Influenza vaccine. This is recommended every year. Tetanus, diphtheria, and acellular pertussis (Tdap, Td) vaccine. You may need a Td booster every  10 years. Zoster vaccine. You may need this after age 24. Pneumococcal 13-valent conjugate (PCV13) vaccine. One dose is recommended after age 82. Pneumococcal polysaccharide (PPSV23) vaccine. One dose is recommended after age 43. Talk to your health care provider about which screenings and vaccines you need and how often you need them. This information is not intended to replace advice given to you by your health care provider. Make sure you discuss any questions you have with your health care provider. Document Released: 12/21/2015 Document Revised: 08/13/2016 Document Reviewed: 09/25/2015 Elsevier Interactive Patient Education  2017 [employer] Prevention in the Home Falls can cause injuries. They can happen to people of all ages. There are many things you can do to make your home safe and to help prevent falls. What can I do on the outside of my home? Regularly fix the edges of walkways and driveways and fix any cracks. Remove anything that might make you trip as you walk through a door, such as a raised step or threshold. Trim any bushes or trees on the path to your home. Use bright outdoor lighting. Clear any walking paths of anything that might make someone trip, such as rocks or tools. Regularly check to see if handrails are loose or broken. Make sure that both sides of any steps have handrails. Any raised decks and porches should have guardrails on the edges. Have any leaves, snow, or ice cleared regularly. Use sand or salt on walking paths during winter. Clean up any spills in your garage right away. This includes oil or grease spills. What can I do in the bathroom? Use night lights. Install grab bars by the toilet and in the tub and shower. Do not use towel bars as grab bars. Use non-skid mats or decals in the tub or shower. If you need to sit down in the shower, use a plastic, non-slip stool. Keep the floor dry. Clean up any water that spills on the floor as soon as it  happens. Remove soap buildup in the tub or shower regularly. Attach bath mats securely with double-sided non-slip rug tape. Do not have throw rugs and other things on the floor that can make you trip. What can I do in the bedroom? Use night lights. Make sure that you have a light by your bed that is easy to reach. Do not use any sheets or blankets that are too big for your bed. They should not hang down onto the floor. Have a firm chair that has side arms. You can use this for support while you get dressed. Do not have throw rugs and other things on the floor that can make you trip. What can I do in the kitchen? Clean up any spills right away. Avoid walking on wet floors. Keep items that you use a lot in easy-to-reach places. If you need to reach something above you, use a strong step stool that has a grab bar. Keep electrical cords out of the way. Do not use floor polish or wax that makes floors slippery. If you must use wax, use non-skid floor wax. Do not have throw rugs and other things on the floor that can make you trip. What can I do with my stairs? Do not leave any items on the stairs. Make sure that there are handrails on both sides of the stairs and use  them. Fix handrails that are broken or loose. Make sure that handrails are as long as the stairways. Check any carpeting to make sure that it is firmly attached to the stairs. Fix any carpet that is loose or worn. Avoid having throw rugs at the top or bottom of the stairs. If you do have throw rugs, attach them to the floor with carpet tape. Make sure that you have a light switch at the top of the stairs and the bottom of the stairs. If you do not have them, ask someone to add them for you. What else can I do to help prevent falls? Wear shoes that: Do not have high heels. Have rubber bottoms. Are comfortable and fit you well. Are closed at the toe. Do not wear sandals. If you use a stepladder: Make sure that it is fully opened.  Do not climb a closed stepladder. Make sure that both sides of the stepladder are locked into place. Ask someone to hold it for you, if possible. Clearly mark and make sure that you can see: Any grab bars or handrails. First and last steps. Where the edge of each step is. Use tools that help you move around (mobility aids) if they are needed. These include: Canes. Walkers. Scooters. Crutches. Turn on the lights when you go into a dark area. Replace any light bulbs as soon as they burn out. Set up your furniture so you have a clear path. Avoid moving your furniture around. If any of your floors are uneven, fix them. If there are any pets around you, be aware of where they are. Review your medicines with your doctor. Some medicines can make you feel dizzy. This can increase your chance of falling. Ask your doctor what other things that you can do to help prevent falls. This information is not intended to replace advice given to you by your health care provider. Make sure you discuss any questions you have with your health care provider. Document Released: 09/20/2009 Document Revised: 05/01/2016 Document Reviewed: 12/29/2014 Elsevier Interactive Patient Education  2017 [employer].

## 2022-09-01 ENCOUNTER — Encounter: Payer: Self-pay | Admitting: *Deleted

## 2022-09-12 ENCOUNTER — Telehealth (HOSPITAL_BASED_OUTPATIENT_CLINIC_OR_DEPARTMENT_OTHER): Payer: Self-pay

## 2022-09-15 ENCOUNTER — Emergency Department (HOSPITAL_COMMUNITY): Payer: Medicare Other

## 2022-09-15 ENCOUNTER — Encounter: Payer: Self-pay | Admitting: Family Medicine

## 2022-09-15 ENCOUNTER — Other Ambulatory Visit: Payer: Self-pay

## 2022-09-15 ENCOUNTER — Emergency Department (HOSPITAL_COMMUNITY)
Admission: EM | Admit: 2022-09-15 | Discharge: 2022-09-15 | Disposition: A | Discharge status: Home / Self Care | Payer: Medicare Other | Attending: Emergency Medicine | Admitting: Emergency Medicine

## 2022-09-15 ENCOUNTER — Ambulatory Visit (INDEPENDENT_AMBULATORY_CARE_PROVIDER_SITE_OTHER): Payer: Medicare Other | Admitting: Family Medicine

## 2022-09-15 VITALS — BP 140/98 | HR 120 | Temp 97.6°F | Ht 68.0 in | Wt 120.2 lb

## 2022-09-15 DIAGNOSIS — I700 Atherosclerosis of aorta: Secondary | ICD-10-CM | POA: Diagnosis not present

## 2022-09-15 DIAGNOSIS — I2510 Atherosclerotic heart disease of native coronary artery without angina pectoris: Secondary | ICD-10-CM | POA: Insufficient documentation

## 2022-09-15 DIAGNOSIS — M6281 Muscle weakness (generalized): Secondary | ICD-10-CM | POA: Insufficient documentation

## 2022-09-15 DIAGNOSIS — Z743 Need for continuous supervision: Secondary | ICD-10-CM | POA: Diagnosis not present

## 2022-09-15 DIAGNOSIS — R6889 Other general symptoms and signs: Secondary | ICD-10-CM | POA: Diagnosis not present

## 2022-09-15 DIAGNOSIS — J439 Emphysema, unspecified: Secondary | ICD-10-CM | POA: Diagnosis not present

## 2022-09-15 DIAGNOSIS — R531 Weakness: Secondary | ICD-10-CM | POA: Diagnosis not present

## 2022-09-15 DIAGNOSIS — Z1152 Encounter for screening for COVID-19: Secondary | ICD-10-CM | POA: Insufficient documentation

## 2022-09-15 DIAGNOSIS — R000 Tachycardia, unspecified: Secondary | ICD-10-CM | POA: Insufficient documentation

## 2022-09-15 DIAGNOSIS — R0602 Shortness of breath: Secondary | ICD-10-CM | POA: Diagnosis not present

## 2022-09-15 DIAGNOSIS — R0600 Dyspnea, unspecified: Secondary | ICD-10-CM

## 2022-09-15 DIAGNOSIS — R4781 Slurred speech: Secondary | ICD-10-CM | POA: Diagnosis not present

## 2022-09-15 DIAGNOSIS — J449 Chronic obstructive pulmonary disease, unspecified: Secondary | ICD-10-CM | POA: Diagnosis not present

## 2022-09-15 DIAGNOSIS — J432 Centrilobular emphysema: Secondary | ICD-10-CM | POA: Insufficient documentation

## 2022-09-15 LAB — URINALYSIS, ROUTINE W REFLEX MICROSCOPIC
Bacteria, UA: NONE SEEN
Bilirubin Urine: NEGATIVE
Glucose, UA: NEGATIVE mg/dL
Ketones, ur: NEGATIVE mg/dL
Nitrite: NEGATIVE
Protein, ur: NEGATIVE mg/dL
Specific Gravity, Urine: 1.011 (ref 1.005–1.030)
pH: 6 (ref 5.0–8.0)

## 2022-09-15 LAB — CBC WITH DIFFERENTIAL/PLATELET
Abs Immature Granulocytes: 0.03 10*3/uL (ref 0.00–0.07)
Basophils Absolute: 0.1 10*3/uL (ref 0.0–0.1)
Basophils Relative: 1 %
Eosinophils Absolute: 0.1 10*3/uL (ref 0.0–0.5)
Eosinophils Relative: 1 %
HCT: 47.6 % — ABNORMAL HIGH (ref 36.0–46.0)
Hemoglobin: 14.8 g/dL (ref 12.0–15.0)
Immature Granulocytes: 0 %
Lymphocytes Relative: 28 %
Lymphs Abs: 3.1 10*3/uL (ref 0.7–4.0)
MCH: 27.7 pg (ref 26.0–34.0)
MCHC: 31.1 g/dL (ref 30.0–36.0)
MCV: 89 fL (ref 80.0–100.0)
Monocytes Absolute: 0.9 10*3/uL (ref 0.1–1.0)
Monocytes Relative: 8 %
Neutro Abs: 6.6 10*3/uL (ref 1.7–7.7)
Neutrophils Relative %: 62 %
Platelets: 513 10*3/uL — ABNORMAL HIGH (ref 150–400)
RBC: 5.35 MIL/uL — ABNORMAL HIGH (ref 3.87–5.11)
RDW: 15.4 % (ref 11.5–15.5)
WBC: 10.8 10*3/uL — ABNORMAL HIGH (ref 4.0–10.5)
nRBC: 0 % (ref 0.0–0.2)

## 2022-09-15 LAB — COMPREHENSIVE METABOLIC PANEL
ALT: 11 U/L (ref 0–44)
AST: 17 U/L (ref 15–41)
Albumin: 4.1 g/dL (ref 3.5–5.0)
Alkaline Phosphatase: 83 U/L (ref 38–126)
Anion gap: 11 (ref 5–15)
BUN: 14 mg/dL (ref 8–23)
CO2: 27 mmol/L (ref 22–32)
Calcium: 9.7 mg/dL (ref 8.9–10.3)
Chloride: 103 mmol/L (ref 98–111)
Creatinine, Ser: 0.82 mg/dL (ref 0.44–1.00)
GFR, Estimated: >60 mL/min (ref >60)
Glucose, Bld: 119 mg/dL — ABNORMAL HIGH (ref 70–99)
Potassium: 3.7 mmol/L (ref 3.5–5.1)
Sodium: 141 mmol/L (ref 135–145)
Total Bilirubin: 0.7 mg/dL (ref 0.3–1.2)
Total Protein: 7.9 g/dL (ref 6.5–8.1)

## 2022-09-15 LAB — D-DIMER, QUANTITATIVE: D-Dimer, Quant: 6.7 ug/mL-FEU — ABNORMAL HIGH (ref 0.00–0.50)

## 2022-09-15 LAB — TROPONIN I (HIGH SENSITIVITY): Troponin I (High Sensitivity): 7 ng/L (ref <18)

## 2022-09-15 LAB — RESP PANEL BY RT-PCR (FLU A&B, COVID) ARPGX2
Influenza A by PCR: NEGATIVE
Influenza B by PCR: NEGATIVE
SARS Coronavirus 2 by RT PCR: NEGATIVE

## 2022-09-15 LAB — CBG MONITORING, ED: Glucose-Capillary: 87 mg/dL (ref 70–99)

## 2022-09-15 LAB — BRAIN NATRIURETIC PEPTIDE: B Natriuretic Peptide: 63.8 pg/mL (ref 0.0–100.0)

## 2022-09-15 MED ORDER — LACTATED RINGERS IV BOLUS
1000.0000 mL | Freq: Once | INTRAVENOUS | Status: AC
  Administered 2022-09-15: 1000 mL via INTRAVENOUS

## 2022-09-15 MED ORDER — SODIUM CHLORIDE (PF) 0.9 % IJ SOLN
INTRAMUSCULAR | Status: AC
  Filled 2022-09-15: qty 50

## 2022-09-15 MED ORDER — IOHEXOL 350 MG/ML SOLN
100.0000 mL | Freq: Once | INTRAVENOUS | Status: AC | PRN
  Administered 2022-09-15: 100 mL via INTRAVENOUS

## 2022-09-15 NOTE — Discharge Instructions (Signed)
If you develop new or worsening weakness, trouble breathing, chest pain, or any other new/concerning symptoms then return to the ER for evaluation.  Otherwise call your doctor tomorrow to set up close outpatient follow-up.

## 2022-09-15 NOTE — ED Notes (Signed)
Patient transported to CT 

## 2022-09-15 NOTE — Progress Notes (Signed)
Subjective  CC:  Chief Complaint  Patient presents with   Fatigue    Pt stated that she has been feeling weak for the past 4 days and has gotten worse.     HPI: Sherry Zimmerman is a 66 y.o. female who presents to the office today to address the problems listed above in the chief complaint. 66 yo complains of feeling weak for last 4 days. Has been in the bed for four days. Tearful. Denies other problems but brother is with her and says she has been out of it. Today is the first day she has gotten up. Was here last month for cpe and did not get blood work drawn. Was feeling well then. She c/o shortness of breath with walking. No chest pain or palpitations. No fevers or uri sxs. PMH with cerebellar ataxia, emphysema, anemia.   Assessment  1. Tachycardia, unspecified   2. General weakness   3. Dyspnea, unspecified type      Plan  Due to above symptoms and patient's appearance, mild AMS intermittently will transport to ER for further evaluation. Follow up: to ER  10/20/2022  No orders of the defined types were placed in this encounter.  No orders of the defined types were placed in this encounter.     I reviewed the patients updated PMH, FH, and SocHx.    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    Priority: High   Migraine without aura and without status migrainosus, not intractable 01/10/2019    Priority: Medium    Acquired iron deficiency anemia due to decreased absorption 06/18/2018    Priority: Medium    OAB (overactive bladder) 11/09/2014    Priority: Medium    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 Take with a full glass of water on an empty stomach. Sit upright for 30 minutes   atorvastatin (LIPITOR) 10 MG tablet Take 1 tablet (10 mg total) by mouth at bedtime.   Glycopyrrolate-Formoterol (BEVESPI AEROSPHERE) 9-4.8 MCG/ACT AERO Inhale 2 puffs into the lungs 2 (two) times daily.   oxybutynin (DITROPAN) 5 MG tablet Take 1 tablet (5 mg total) by mouth 2 (two) times daily.   rizatriptan (MAXALT) 10 MG tablet Take 10 mg by mouth as needed for migraine. May repeat in 2 hours if needed   valACYclovir (VALTREX) 1000 MG tablet Take 2 tablets (2,000 mg total) by mouth 2 (two) times daily. Once, as needed for cold sores   venlafaxine XR (EFFEXOR-XR) 150 MG 24 hr capsule TAKE 1 CAPSULE BY MOUTH AT BEDTIME **TAKE  WITH  $'75MG'i$   CAPSULES  FOR  '225MG'$   TOTAL  DAILY  DOSE   venlafaxine XR (EFFEXOR-XR) 75 MG 24 hr capsule TAKE 1 CAPSULE BY MOUTH ONCE DAILY **TAKE  WITH  '150MG'$   CAPSULES  FOR  '225MG'$   TOTAL  DAILY  DOSE    Allergies: Patient is allergic to morphine and related, sulfa antibiotics, and sulfacetamide sodium. Family History: Patient family history includes Ataxia in her mother; Cancer in her father; Diabetes in her mother; Heart disease in her mother; Stroke in her mother. Social History:  Patient  reports that she has quit smoking. Her smoking use included cigarettes. She has a 50.00 pack-year smoking history. She has never used smokeless tobacco. She reports current alcohol use. She reports that she does not use drugs.  Review of  Systems: Constitutional: Negative for fever malaise or anorexia Cardiovascular: negative for chest pain Respiratory: negative for SOB or persistent cough Gastrointestinal: negative for abdominal pain  Objective  Vitals: BP (!) 140/98   Pulse (!) 120   Temp 97.6 F (36.4 C)   Ht '5\' 8"'$  (1.727 m)   Wt 120 lb 3.2 oz (54.5 kg)   SpO2 92%   BMI 18.28 kg/m  General: AMS at times, appears weak, tearful,  Cardiovascular:  tachycardic, no gallop or murmur Respiratory:  Good breath sounds bilaterally, CTAB with normal respiratory effort Skin:  Warm, no rashes    Commons side effects, risks, benefits, and alternatives for medications and treatment plan prescribed today were discussed, and the patient expressed understanding of the given instructions. Patient is instructed to call or message via MyChart if he/she has any questions or concerns regarding our treatment plan. No barriers to understanding were identified. We discussed Red Flag symptoms and signs in detail. Patient expressed understanding regarding what to do in case of urgent or emergency type symptoms.  Medication list was reconciled, printed and provided to the patient in AVS. Patient instructions and summary information was reviewed with the patient as documented in the AVS. This note was prepared with assistance of Dragon voice recognition software. Occasional wrong-word or sound-a-like substitutions may have occurred due to the inherent limitations of voice recognition software  This visit occurred during the SARS-CoV-2 public health emergency.  Safety protocols were in place, including screening questions prior to the visit, additional usage of staff PPE, and extensive cleaning of exam room while observing appropriate contact time as indicated for disinfecting solutions.

## 2022-09-15 NOTE — ED Notes (Signed)
Pt able to ambulate a small distance with a walker. O2 sat read 93%-95% on room air

## 2022-09-15 NOTE — ED Notes (Signed)
Pt states she cannot ambulate at this time.

## 2022-09-15 NOTE — ED Provider Notes (Signed)
[facility] DEPT Provider Note   CSN: [id number] Arrival date & time: 09/15/22  1254     History  Chief Complaint  Patient presents with   Fatigue    Sherry Zimmerman is a 66 y.o. female.  HPI 66 year old female presents with dyspnea on exertion and generalized weakness.  This has been ongoing for about 4 days.  She has chronic ataxia and falls from this.  She normally walks with a walker.  However even with a walker she is short of breath with only 1 step.  She will have to rest and try again.  There is no chest pain, cough, leg swelling.  The shortness of breath is not really present at rest.  However she also feels diffusely fatigued and weak/no energy.  No headache.  She has not noticed any bloody stools or blood loss anywhere.  Went to her doctor's office and they called EMS to send her here.  Home Medications Prior to Admission medications   Medication Sig Start Date End Date Taking? Authorizing Provider  albuterol (VENTOLIN HFA) 108 (90 Base) MCG/ACT inhaler INHALE 2 PUFFS BY MOUTH EVERY 4 HOURS AS NEEDED FOR WHEEZING FOR SHORTNESS OF BREATH 08/21/21   Leamon Arnt, MD  alendronate (FOSAMAX) 70 MG tablet Take 1 tablet (70 mg total) by mouth every 7 (seven) days. Take with a full glass of water on an empty stomach. Sit upright for 30 minutes 07/18/22   Leamon Arnt, MD  atorvastatin (LIPITOR) 10 MG tablet Take 1 tablet (10 mg total) by mouth at bedtime. 07/18/22   Leamon Arnt, MD  Glycopyrrolate-Formoterol (BEVESPI AEROSPHERE) 9-4.8 MCG/ACT AERO Inhale 2 puffs into the lungs 2 (two) times daily. 07/18/22   Leamon Arnt, MD  oxybutynin (DITROPAN) 5 MG tablet Take 1 tablet (5 mg total) by mouth 2 (two) times daily. 07/18/22   Leamon Arnt, MD  rizatriptan (MAXALT) 10 MG tablet Take 10 mg by mouth as needed for migraine. May repeat in 2 hours if needed    [provider]  valACYclovir (VALTREX) 1000 MG tablet Take 2 tablets (2,000 mg  total) by mouth 2 (two) times daily. Once, as needed for cold sores 07/23/22   Leamon Arnt, MD  venlafaxine XR (EFFEXOR-XR) 150 MG 24 hr capsule TAKE 1 CAPSULE BY MOUTH AT BEDTIME **TAKE  WITH  '75MG'$   CAPSULES  FOR  '225MG'$   TOTAL  DAILY  DOSE 07/18/22   Leamon Arnt, MD  venlafaxine XR (EFFEXOR-XR) 75 MG 24 hr capsule TAKE 1 CAPSULE BY MOUTH ONCE DAILY **TAKE  WITH  '150MG'$   CAPSULES  FOR  '225MG'$   TOTAL  DAILY  DOSE 07/18/22   Leamon Arnt, MD      Allergies    Morphine and related, Sulfa antibiotics, and Sulfacetamide sodium    Review of Systems   Review of Systems  Constitutional:  Positive for fatigue. Negative for fever.  Respiratory:  Positive for shortness of breath. Negative for cough.   Cardiovascular:  Negative for chest pain and leg swelling.  Gastrointestinal:  Negative for abdominal pain.  Neurological:  Positive for weakness. Negative for headaches.    Physical Exam Updated Vital Signs BP (!) 143/95   Pulse (!) 105   Temp 98.1 F (36.7 C) (Oral)   Resp 20   SpO2 92%  Physical Exam Vitals and nursing note reviewed.  Constitutional:      General: She is not in acute distress.    Appearance: She  is well-developed. She is not ill-appearing or diaphoretic.  HENT:     Head: Normocephalic and atraumatic.  Cardiovascular:     Rate and Rhythm: Regular rhythm. Tachycardia present.     Heart sounds: Normal heart sounds.  Pulmonary:     Effort: Pulmonary effort is normal.     Breath sounds: Normal breath sounds. No wheezing.  Abdominal:     General: There is no distension.     Palpations: Abdomen is soft.     Tenderness: There is no abdominal tenderness.  Musculoskeletal:     Right lower leg: No edema.     Left lower leg: No edema.  Skin:    General: Skin is warm and dry.  Neurological:     Mental Status: She is alert and oriented to person, place, and time.     ED Results / Procedures / Treatments   Labs (all labs ordered are listed, but only abnormal results  are displayed) Labs Reviewed  CBC WITH DIFFERENTIAL/PLATELET - Abnormal; Notable for the following components:      Result Value   WBC 10.8 (*)    RBC 5.35 (*)    HCT 47.6 (*)    Platelets 513 (*)    All other components within normal limits  COMPREHENSIVE METABOLIC PANEL - Abnormal; Notable for the following components:   Glucose, Bld 119 (*)    All other components within normal limits  URINALYSIS, ROUTINE W REFLEX MICROSCOPIC - Abnormal; Notable for the following components:   Color, Urine STRAW (*)    Hgb urine dipstick SMALL (*)    Leukocytes,Ua SMALL (*)    All other components within normal limits  D-DIMER, QUANTITATIVE - Abnormal; Notable for the following components:   D-Dimer, Quant 6.70 (*)    All other components within normal limits  RESP PANEL BY RT-PCR (FLU A&B, COVID) [id number]  BRAIN NATRIURETIC PEPTIDE  CBG MONITORING, ED  TROPONIN I (HIGH SENSITIVITY)    EKG EKG Interpretation  Date/Time:  Monday September 15 2022 13:13:22 EDT Ventricular Rate:  107 PR Interval:  136 QRS Duration: 78 QT Interval:  301 QTC Calculation: 402 R Axis:   63 Text Interpretation: Sinus tachycardia RAE, consider biatrial enlargement Probable anterior infarct, age indeterminate nonspecific T wave changes more prominent when compared to 2015 Confirmed by Sherwood Gambler [phone]) on 09/15/2022 2:17:31 PM  Radiology CT Angio Chest PE W and/or Wo Contrast  Result Date: 09/15/2022 CLINICAL DATA:  PE suspected, generalized weakness EXAM: CT ANGIOGRAPHY CHEST WITH CONTRAST TECHNIQUE: Multidetector CT imaging of the chest was performed using the standard protocol during bolus administration of intravenous contrast. Multiplanar CT image reconstructions and MIPs were obtained to evaluate the vascular anatomy. RADIATION DOSE REDUCTION: This exam was performed according to the departmental dose-optimization program which includes automated exposure control, adjustment of the mA and/or kV according to  patient size and/or use of iterative reconstruction technique. CONTRAST:  127m OMNIPAQUE IOHEXOL 350 MG/ML SOLN COMPARISON:  08/07/2022 FINDINGS: Cardiovascular: Satisfactory opacification of the pulmonary arteries to the segmental level. No evidence of pulmonary embolism. Normal heart size. Three-vessel coronary artery calcifications. No pericardial effusion. Aortic atherosclerosis. Mediastinum/Nodes: No enlarged mediastinal, hilar, or axillary lymph nodes. Thyroid gland, trachea, and esophagus demonstrate no significant findings. Lungs/Pleura: Moderate to severe centrilobular emphysema. Bandlike scarring of the left lung base. No pleural effusion or pneumothorax. Upper Abdomen: No acute abnormality. Musculoskeletal: No chest wall abnormality. No acute osseous findings. Review of the MIP images confirms the above findings. IMPRESSION: 1. Negative examination for  pulmonary embolism. 2. Moderate to severe centrilobular emphysema. 3. Coronary artery disease. Aortic Atherosclerosis (ICD10-I70.0) and Emphysema (ICD10-J43.9). Electronically Signed   By: Delanna Ahmadi M.D.   On: 09/15/2022 14:53   DG Chest 2 View  Result Date: 09/15/2022 CLINICAL DATA:  Shortness of breath on exertion, weakness EXAM: CHEST - 2 VIEW COMPARISON:  04/04/2021 FINDINGS: Insert COPD scarring in the left lung base. Right lung clear. No effusions or acute bony abnormality. Heart is normal size. IMPRESSION: COPD/chronic changes.  No active disease. Electronically Signed   By: Rolm Baptise M.D.   On: 09/15/2022 13:55    Procedures Procedures    Medications Ordered in ED Medications  lactated ringers bolus 1,000 mL (0 mLs Intravenous Stopped 09/15/22 1454)  iohexol (OMNIPAQUE) 350 MG/ML injection 100 mL (100 mLs Intravenous Contrast Given 09/15/22 1432)  sodium chloride (PF) 0.9 % injection (  Given by Other 09/15/22 1433)    ED Course/ Medical Decision Making/ A&P                           Medical Decision Making Amount and/or  Complexity of Data Reviewed External Data Reviewed: notes.    Details: PCP notes from earlier today. Labs: ordered.    Details: Mild leukocytosis of unclear etiology.  No anemia, renal failure, or significant electrolyte disturbance.  D-dimer is elevated at 6.7 Radiology: ordered and independent interpretation performed.    Details: No pneumonia on chest x-ray.  CT shows no PE. ECG/medicine tests: independent interpretation performed.    Details: Nonspecific ST/T changes but similar to prior.  Risk Prescription drug management.   Patient is primarily having generalized weakness as well as some dyspnea.  No hypoxia here.  Work-up is unremarkable.  Is been ongoing for about 4 days I think with a negative troponin ACS is unlikely and I do not think a second is needed.  Work-up for PE is negative, no evidence of pneumonia.    No urine symptoms and urine shows only small leukocytes, not consistent with UTI.  Unclear why she is feeling so generally weak though family at the bedside was concerned they might be having some sort of respiratory process recently as well.  No evidence of bacterial pneumonia.  Offered admission/observation for further management and work-up but patient declines and wants to go home.  She was able to ambulate with a walker which is her baseline and had no hypoxia.  No neuro symptoms besides generalized weakness to suggest needing CT head or MRI.  At this point, we discussed that there is no clear etiology but her heart rate is improved since her PCP visit earlier today and she is feeling better and appears stable for discharge.  We will give return precautions.        Final Clinical Impression(s) / ED Diagnoses Final diagnoses:  Generalized weakness    Rx / DC Orders ED Discharge Orders     None         Sherwood Gambler, MD 09/15/22 1545

## 2022-09-15 NOTE — ED Triage Notes (Signed)
Pt BIB by EMS from her PCP office for increased generalized weakness x 4 days. Pt is fatigued, and has had a few falls due to her weakness

## 2022-09-17 ENCOUNTER — Encounter: Payer: Self-pay | Admitting: Family Medicine

## 2022-09-18 ENCOUNTER — Ambulatory Visit (INDEPENDENT_AMBULATORY_CARE_PROVIDER_SITE_OTHER): Payer: Medicare Other | Admitting: Family Medicine

## 2022-09-18 ENCOUNTER — Encounter: Payer: Self-pay | Admitting: Family Medicine

## 2022-09-18 VITALS — BP 120/68 | HR 102 | Temp 97.9°F | Ht 68.0 in | Wt 121.6 lb

## 2022-09-18 DIAGNOSIS — R112 Nausea with vomiting, unspecified: Secondary | ICD-10-CM | POA: Diagnosis not present

## 2022-09-18 DIAGNOSIS — R197 Diarrhea, unspecified: Secondary | ICD-10-CM | POA: Diagnosis not present

## 2022-09-18 DIAGNOSIS — R531 Weakness: Secondary | ICD-10-CM | POA: Diagnosis not present

## 2022-09-18 LAB — CBC WITH DIFFERENTIAL/PLATELET
Basophils Absolute: 0.1 10*3/uL (ref 0.0–0.1)
Basophils Relative: 0.9 % (ref 0.0–3.0)
Eosinophils Absolute: 0.1 10*3/uL (ref 0.0–0.7)
Eosinophils Relative: 0.7 % (ref 0.0–5.0)
HCT: 38.7 % (ref 36.0–46.0)
Hemoglobin: 12.5 g/dL (ref 12.0–15.0)
Lymphocytes Relative: 26.8 % (ref 12.0–46.0)
Lymphs Abs: 2.6 10*3/uL (ref 0.7–4.0)
MCHC: 32.4 g/dL (ref 30.0–36.0)
MCV: 87.2 fl (ref 78.0–100.0)
Monocytes Absolute: 1.1 10*3/uL — ABNORMAL HIGH (ref 0.1–1.0)
Monocytes Relative: 10.9 % (ref 3.0–12.0)
Neutro Abs: 5.9 10*3/uL (ref 1.4–7.7)
Neutrophils Relative %: 60.7 % (ref 43.0–77.0)
Platelets: 374 10*3/uL (ref 150.0–400.0)
RBC: 4.44 Mil/uL (ref 3.87–5.11)
RDW: 15.8 % — ABNORMAL HIGH (ref 11.5–15.5)
WBC: 9.7 10*3/uL (ref 4.0–10.5)

## 2022-09-18 LAB — TSH: TSH: 1.53 u[IU]/mL (ref 0.35–5.50)

## 2022-09-18 LAB — LIPASE: Lipase: 14 U/L (ref 11.0–59.0)

## 2022-09-18 LAB — SEDIMENTATION RATE: Sed Rate: 30 mm/hr (ref 0–30)

## 2022-09-18 NOTE — Patient Instructions (Signed)
Please follow up as scheduled for your next visit with me: 10/20/2022   If you have any questions or concerns, please don't hesitate to send me a message via MyChart or call the office at [phone]. Thank you for visiting with [place] today! It's our pleasure caring for you.

## 2022-09-18 NOTE — Addendum Note (Signed)
Addended by: Loura Back on: 09/18/2022 03:56 PM   Modules accepted: Orders

## 2022-09-18 NOTE — Progress Notes (Signed)
Subjective  CC:  Chief Complaint  Patient presents with   Hospitalization Follow-up    Fatigue. Pt is feeling much better since her ED visit    HPI: Sherry Zimmerman is a 65 y.o. female who presents to the office today to address the problems listed above in the chief complaint. Reviewed ER eval: thorough eval with blood work below, chest xray and chest CT, and EKG: unable to identify cause of global weakness, confusion, and sob/tachy. Since, she had done better.  More alert, less tired, walking w/o difficulty and eating normally. However had urgent loose stool yesterday and one episode of n/vomiting. No abdominal pain. No new sxs.  No visits with results within 1 Day(s) from this visit.  Latest known visit with results is:  Admission on 09/15/2022, Discharged on 09/15/2022  Component Date Value Ref Range Status   WBC 09/15/2022 10.8 (H)  4.0 - 10.5 K/uL Final   RBC 09/15/2022 5.35 (H)  3.87 - 5.11 MIL/uL Final   Hemoglobin 09/15/2022 14.8  12.0 - 15.0 g/dL Final   HCT 09/15/2022 47.6 (H)  36.0 - 46.0 % Final   MCV 09/15/2022 89.0  80.0 - 100.0 fL Final   MCH 09/15/2022 27.7  26.0 - 34.0 pg Final   MCHC 09/15/2022 31.1  30.0 - 36.0 g/dL Final   RDW 09/15/2022 15.4  11.5 - 15.5 % Final   Platelets 09/15/2022 513 (H)  150 - 400 K/uL Final   nRBC 09/15/2022 0.0  0.0 - 0.2 % Final   Neutrophils Relative % 09/15/2022 62  % Final   Neutro Abs 09/15/2022 6.6  1.7 - 7.7 K/uL Final   Lymphocytes Relative 09/15/2022 28  % Final   Lymphs Abs 09/15/2022 3.1  0.7 - 4.0 K/uL Final   Monocytes Relative 09/15/2022 8  % Final   Monocytes Absolute 09/15/2022 0.9  0.1 - 1.0 K/uL Final   Eosinophils Relative 09/15/2022 1  % Final   Eosinophils Absolute 09/15/2022 0.1  0.0 - 0.5 K/uL Final   Basophils Relative 09/15/2022 1  % Final   Basophils Absolute 09/15/2022 0.1  0.0 - 0.1 K/uL Final   Immature Granulocytes 09/15/2022 0  % Final   Abs Immature Granulocytes 09/15/2022 0.03  0.00 - 0.07 K/uL  Final   Troponin I (High Sensitivity) 09/15/2022 7  <18 ng/L Final   Sodium 09/15/2022 141  135 - 145 mmol/L Final   Potassium 09/15/2022 3.7  3.5 - 5.1 mmol/L Final   Chloride 09/15/2022 103  98 - 111 mmol/L Final   CO2 09/15/2022 27  22 - 32 mmol/L Final   Glucose, Bld 09/15/2022 119 (H)  70 - 99 mg/dL Final   BUN 09/15/2022 14  8 - 23 mg/dL Final   Creatinine, Ser 09/15/2022 0.82  0.44 - 1.00 mg/dL Final   Calcium 09/15/2022 9.7  8.9 - 10.3 mg/dL Final   Total Protein 09/15/2022 7.9  6.5 - 8.1 g/dL Final   Albumin 09/15/2022 4.1  3.5 - 5.0 g/dL Final   AST 09/15/2022 17  15 - 41 U/L Final   ALT 09/15/2022 11  0 - 44 U/L Final   Alkaline Phosphatase 09/15/2022 83  38 - 126 U/L Final   Total Bilirubin 09/15/2022 0.7  0.3 - 1.2 mg/dL Final   GFR, Estimated 09/15/2022 >60  >60 mL/min Final   Anion gap 09/15/2022 11  5 - 15 Final   Glucose-Capillary 09/15/2022 87  70 - 99 mg/dL Final   B Natriuretic Peptide 09/15/2022 63.8  0.0 - 100.0 pg/mL Final   SARS Coronavirus 2 by RT PCR 09/15/2022 NEGATIVE  NEGATIVE Final   Influenza A by PCR 09/15/2022 NEGATIVE  NEGATIVE Final   Influenza B by PCR 09/15/2022 NEGATIVE  NEGATIVE Final   Color, Urine 09/15/2022 STRAW (A)  YELLOW Final   APPearance 09/15/2022 CLEAR  CLEAR Final   Specific Gravity, Urine 09/15/2022 1.011  1.005 - 1.030 Final   pH 09/15/2022 6.0  5.0 - 8.0 Final   Glucose, UA 09/15/2022 NEGATIVE  NEGATIVE mg/dL Final   Hgb urine dipstick 09/15/2022 SMALL (A)  NEGATIVE Final   Bilirubin Urine 09/15/2022 NEGATIVE  NEGATIVE Final   Ketones, ur 09/15/2022 NEGATIVE  NEGATIVE mg/dL Final   Protein, ur 09/15/2022 NEGATIVE  NEGATIVE mg/dL Final   Nitrite 09/15/2022 NEGATIVE  NEGATIVE Final   Leukocytes,Ua 09/15/2022 SMALL (A)  NEGATIVE Final   RBC / HPF 09/15/2022 0-5  0 - 5 RBC/hpf Final   WBC, UA 09/15/2022 6-10  0 - 5 WBC/hpf Final   Bacteria, UA 09/15/2022 NONE SEEN  NONE SEEN Final   Squamous Epithelial / LPF 09/15/2022 0-5  0 - 5  Final   Mucus 09/15/2022 PRESENT   Final   Hyaline Casts, UA 09/15/2022 PRESENT   Final   D-Dimer, Quant 09/15/2022 6.70 (H)  0.00 - 0.50 ug/mL-FEU Final    Assessment  1. General weakness   2. Nausea vomiting and diarrhea      Plan  weakness:  unclear cause: Urine studies suggestive of infection but no culture was done so will order. Check tsh, esr, ammonia level, repeat cbc and monitor. Pt and husband aware that if sxs return, to return here or ER for further eval.   Follow up: as scheduled for bp recheck  10/20/2022  Orders Placed This Encounter  Procedures   Urine Culture   Ammonia   Lipase   Sedimentation rate   CBC with Differential/Platelet   TSH   No orders of the defined types were placed in this encounter.     I reviewed the patients updated PMH, FH, and SocHx.    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    Priority: High   Migraine without aura and without status migrainosus, not intractable 01/10/2019    Priority: Medium    Acquired iron deficiency anemia due to decreased absorption 06/18/2018    Priority: Medium    OAB (overactive bladder) 11/09/2014    Priority: Medium    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 Take with a full glass of water on an empty stomach. Sit upright  for 30 minutes   atorvastatin (LIPITOR) 10 MG tablet Take 1 tablet (10 mg total) by mouth at bedtime.   Glycopyrrolate-Formoterol (BEVESPI AEROSPHERE) 9-4.8 MCG/ACT AERO Inhale 2 puffs into the lungs 2 (two) times daily.   oxybutynin (DITROPAN) 5 MG tablet Take 1 tablet (5 mg total) by mouth 2 (two) times daily.   rizatriptan (MAXALT) 10 MG tablet Take 10 mg by mouth as needed for migraine. May repeat in 2 hours if needed   valACYclovir (VALTREX) 1000 MG tablet Take 2 tablets (2,000 mg total) by mouth 2 (two) times daily. Once, as needed for cold sores   venlafaxine XR (EFFEXOR-XR) 150 MG 24 hr capsule TAKE 1 CAPSULE BY MOUTH AT BEDTIME **TAKE  WITH  75MG  CAPSULES  FOR  225MG  TOTAL  DAILY  DOSE   venlafaxine XR (EFFEXOR-XR) 75 MG 24 hr capsule TAKE 1 CAPSULE BY MOUTH ONCE DAILY **TAKE  WITH  150MG  CAPSULES  FOR  225MG  TOTAL  DAILY  DOSE    Allergies: Patient is allergic to morphine and related, sulfa antibiotics, and sulfacetamide sodium. Family History: Patient family history includes Ataxia in her mother; Cancer in her father; Diabetes in her mother; Heart disease in her mother; Stroke in her mother. Social History:  Patient  reports that she has quit smoking. Her smoking use included cigarettes. She has a 50.00 pack-year smoking history. She has never used smokeless tobacco. She reports current alcohol use. She reports that she does not use drugs.  Review of Systems: Constitutional: Negative for fever malaise or anorexia Cardiovascular: negative for chest pain Respiratory: negative for SOB or persistent cough Gastrointestinal: negative for abdominal pain  Objective  Vitals: BP 120/68   Pulse (!) 102   Temp 97.9 F (36.6 C)   Ht _0  (1.727 m)   Wt 121 lb 9.6 oz (55.2 kg)   SpO2 93%   BMI 18.49 kg/m  General: no acute distress , A&Ox3, appears  alert and at baseline again HEENT: PEERL, conjunctiva normal, neck is supple Cardiovascular:  RRR without murmur or gallop.  Respiratory:  distant breath sounds bilaterally, CTAB with normal respiratory effort Skin:  Warm, no rashes Abdomen is soft nontender. Nondistended No edema    Commons side effects, risks, benefits, and alternatives for medications and treatment plan prescribed today were discussed, and the patient expressed understanding of the given instructions. Patient is instructed to call or message via MyChart if he/she has any questions or concerns regarding our treatment plan. No barriers to understanding were identified. We discussed Red Flag symptoms and signs in detail. Patient expressed understanding regarding what to do in case of urgent or emergency type symptoms.  Medication list was reconciled, printed and provided to the patient in AVS. Patient instructions and summary information was reviewed with the patient as documented in the AVS. This note was prepared with assistance of Dragon voice recognition software. Occasional wrong-word or sound-a-like substitutions may have occurred due to the inherent limitations of voice recognition software  This visit occurred during the SARS-CoV-2 public health emergency.  Safety protocols were in place, including screening questions prior to the visit, additional usage of staff PPE, and extensive cleaning of exam room while observing appropriate contact time as indicated for disinfecting solutions.

## 2022-09-19 ENCOUNTER — Other Ambulatory Visit (INDEPENDENT_AMBULATORY_CARE_PROVIDER_SITE_OTHER): Payer: Medicare Other

## 2022-09-19 DIAGNOSIS — R197 Diarrhea, unspecified: Secondary | ICD-10-CM

## 2022-09-19 DIAGNOSIS — Z0000 Encounter for general adult medical examination without abnormal findings: Secondary | ICD-10-CM | POA: Diagnosis not present

## 2022-09-19 DIAGNOSIS — R531 Weakness: Secondary | ICD-10-CM | POA: Diagnosis not present

## 2022-09-19 DIAGNOSIS — R112 Nausea with vomiting, unspecified: Secondary | ICD-10-CM

## 2022-09-19 DIAGNOSIS — D508 Other iron deficiency anemias: Secondary | ICD-10-CM | POA: Diagnosis not present

## 2022-09-19 LAB — COMPREHENSIVE METABOLIC PANEL
ALT: 10 U/L (ref 0–35)
AST: 19 U/L (ref 0–37)
Albumin: 3.8 g/dL (ref 3.5–5.2)
Alkaline Phosphatase: 77 U/L (ref 39–117)
BUN: 17 mg/dL (ref 6–23)
CO2: 31 mEq/L (ref 19–32)
Calcium: 9.7 mg/dL (ref 8.4–10.5)
Chloride: 105 mEq/L (ref 96–112)
Creatinine, Ser: 1.2 mg/dL (ref 0.40–1.20)
GFR: 46.77 mL/min — ABNORMAL LOW (ref >60.00)
Glucose, Bld: 109 mg/dL — ABNORMAL HIGH (ref 70–99)
Potassium: 4.3 mEq/L (ref 3.5–5.1)
Sodium: 143 mEq/L (ref 135–145)
Total Bilirubin: 0.5 mg/dL (ref 0.2–1.2)
Total Protein: 6.6 g/dL (ref 6.0–8.3)

## 2022-09-19 LAB — LIPID PANEL
Cholesterol: 130 mg/dL (ref 0–200)
HDL: 54.4 mg/dL (ref >39.00)
LDL Cholesterol: 51 mg/dL (ref 0–99)
NonHDL: 75.18
Total CHOL/HDL Ratio: 2
Triglycerides: 119 mg/dL (ref 0.0–149.0)
VLDL: 23.8 mg/dL (ref 0.0–40.0)

## 2022-09-19 LAB — AMMONIA: Ammonia: 40 umol/L — ABNORMAL HIGH (ref 11–35)

## 2022-09-20 LAB — IRON,TIBC AND FERRITIN PANEL
%SAT: 23 % (calc) (ref 16–45)
Ferritin: 16 ng/mL (ref 16–288)
Iron: 94 ug/dL (ref 45–160)
TIBC: 402 mcg/dL (calc) (ref 250–450)

## 2022-09-22 LAB — URINE CULTURE
MICRO NUMBER:: 14048470
SPECIMEN QUALITY:: ADEQUATE

## 2022-09-23 MED ORDER — NITROFURANTOIN MONOHYD MACRO 100 MG PO CAPS: 100.0000 mg | ORAL_CAPSULE | Freq: Two times a day (BID) | ORAL | 0 refills | Status: DC

## 2022-09-23 NOTE — Progress Notes (Signed)
Please call patient: I have reviewed his/her lab results. Urine culture shows possible persistent infection; I'm electing to treat: I've ordered 5 days of antibiotics to take. Please let her know and ask if she is still having any symptoms of weakness or fatigue. thanks

## 2022-09-23 NOTE — Addendum Note (Signed)
Addended by: Billey Chang on: 09/23/2022 10:38 AM   Modules accepted: Orders

## 2022-10-08 ENCOUNTER — Encounter (INDEPENDENT_AMBULATORY_CARE_PROVIDER_SITE_OTHER): Payer: Medicare Other | Admitting: Family Medicine

## 2022-10-08 DIAGNOSIS — M25512 Pain in left shoulder: Secondary | ICD-10-CM

## 2022-10-08 NOTE — Telephone Encounter (Signed)
A total of 7 minutes were spent by me to personally review the patient-generated inquiry, review patient records and data pertinent to assessment of the patient's problem, develop a management plan including generation of prescriptions and/or orders, and on subsequent communication with the patient through secure the MyChart portal service. There is no separately reported E/M service related to this service in the past 7 days nor does the patient have an upcoming soonest available appointment for this issue. This work was completed in less than 7 days.      The codes to be used for the E/M service are:  99421 for five-10 minutes of time spent on the inquiry.  

## 2022-10-10 ENCOUNTER — Ambulatory Visit (INDEPENDENT_AMBULATORY_CARE_PROVIDER_SITE_OTHER)
Admission: RE | Admit: 2022-10-10 | Discharge: 2022-10-10 | Disposition: A | Discharge status: Home / Self Care | Payer: Medicare Other | Source: Ambulatory Visit | Attending: Family Medicine | Admitting: Family Medicine

## 2022-10-10 DIAGNOSIS — M25512 Pain in left shoulder: Secondary | ICD-10-CM | POA: Diagnosis not present

## 2022-10-20 ENCOUNTER — Encounter: Payer: Self-pay | Admitting: Family Medicine

## 2022-10-20 ENCOUNTER — Ambulatory Visit (INDEPENDENT_AMBULATORY_CARE_PROVIDER_SITE_OTHER): Payer: Medicare Other | Admitting: Family Medicine

## 2022-10-20 VITALS — BP 124/74 | HR 100 | Temp 98.1°F | Ht 68.0 in | Wt 124.8 lb

## 2022-10-20 DIAGNOSIS — M75102 Unspecified rotator cuff tear or rupture of left shoulder, not specified as traumatic: Secondary | ICD-10-CM

## 2022-10-20 DIAGNOSIS — M12812 Other specific arthropathies, not elsewhere classified, left shoulder: Secondary | ICD-10-CM

## 2022-10-20 DIAGNOSIS — Z23 Encounter for immunization: Secondary | ICD-10-CM | POA: Diagnosis not present

## 2022-10-20 NOTE — Progress Notes (Signed)
Subjective  CC:  Chief Complaint  Patient presents with   Shoulder Pain    HPI: Sherry Zimmerman is a 66 y.o. female who presents to the office today to address the problems listed above in the chief complaint. 15 female with cerebellar ataxia reports she took a fall in mid October, approximately October 15, and since, has had shoulder pain.  She is uncertain how she landed, but at the time of the fall she noted pain.  Since, she has had decreased range of motion, pain with trying to lift the arm, unable to lift the arm up overhead and nighttime pain.  She likes to sleep on her left side but has not been able to.  Pain interferes with sleep.  No neck pain or weakness in the arm.  No paresthesias.  No other injuries.  He had an x-ray done which was negative for bony changes.  No prior history of shoulder problems  Assessment  1. Left rotator cuff tear arthropathy   2. Need for immunization against influenza      Plan  Left rotator cuff arthropathy: Possible partial tear.  Education given.  Steroid injection given.  Routine postprocedure care instructions discussed.  Refer for physical therapy.  If not improving, will refer to specialist.  May use Tylenol as needed for pain. Flu shot given today  Follow up: Return if symptoms worsen or fail to improve.  Visit date not found  Orders Placed This Encounter  Procedures   Flu Vaccine QUAD High Dose(Fluad)   Ambulatory referral to Physical Therapy   No orders of the defined types were placed in this encounter.     I reviewed the patients updated PMH, FH, and SocHx.    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    Priority: High   Migraine without aura and without status migrainosus, not intractable 01/10/2019    Priority: Medium    Acquired iron deficiency anemia due to decreased absorption 06/18/2018    Priority: Medium    OAB (overactive bladder) 11/09/2014    Priority: Medium    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 Take with a full glass of water on an empty stomach. Sit upright for 30 minutes   atorvastatin (LIPITOR) 10 MG tablet Take 1 tablet (10 mg total) by mouth at bedtime.   Glycopyrrolate-Formoterol (BEVESPI AEROSPHERE) 9-4.8 MCG/ACT AERO Inhale 2 puffs into the lungs 2 (two) times daily.   oxybutynin (DITROPAN) 5 MG tablet Take 1 tablet (5 mg total) by mouth 2 (two) times daily.   rizatriptan (MAXALT) 10 MG tablet  Take 10 mg by mouth as needed for migraine. May repeat in 2 hours if needed   valACYclovir (VALTREX) 1000 MG tablet Take 2 tablets (2,000 mg total) by mouth 2 (two) times daily. Once, as needed for cold sores   venlafaxine XR (EFFEXOR-XR) 150 MG 24 hr capsule TAKE 1 CAPSULE BY MOUTH AT BEDTIME **TAKE  WITH  '75MG'$   CAPSULES  FOR  '225MG'$   TOTAL  DAILY  DOSE   venlafaxine XR (EFFEXOR-XR) 75 MG 24 hr capsule TAKE 1 CAPSULE BY MOUTH ONCE DAILY **TAKE  WITH  '150MG'$   CAPSULES  FOR  '225MG'$   TOTAL  DAILY  DOSE    Allergies: Patient is allergic to morphine and related, sulfa antibiotics, and sulfacetamide sodium. Family History: Patient family history includes Ataxia in her mother; Cancer in her father;  Diabetes in her mother; Heart disease in her mother; Stroke in her mother. Social History:  Patient  reports that she has quit smoking. Her smoking use included cigarettes. She has a 50.00 pack-year smoking history. She has never used smokeless tobacco. She reports current alcohol use. She reports that she does not use drugs.  Review of Systems: Constitutional: Negative for fever malaise or anorexia Cardiovascular: negative for chest pain Respiratory: negative for SOB or persistent cough Gastrointestinal: negative for abdominal pain  Objective  Vitals: BP 124/74   Pulse 100   Temp 98.1 F (36.7 C)   Ht '5\' 8"'$  (1.727 m)   Wt 124 lb 12.8 oz (56.6 kg)   SpO2 91%   BMI 18.98 kg/m  General: no acute distress , A&Ox3 Shoulder: Crepitus, posterior subacromial bursal tenderness, pain with abduction greater than 90 degrees.  Mildly limited passive range of motion.  Positive empty can testing, hard for her to hold shoulder up against resistance.  Shoulder Injection Procedure Note  Pre-operative Diagnosis: left shoulder  Post-operative Diagnosis: same  Indications: Symptomatic relief and failed conservative measures  Anesthesia: Lidocaine 1% without epinephrine without added sodium bicarbonate  Procedure Details   Verbal consent was obtained for the procedure. The shoulder was prepped with alcohol  and the skin was anesthetized with cold spray. Using a 22 gauge needle the subacromial space was injected with 4 mL 1% lidocaine and 1 mL of triamcinolone (KENALOG) '40mg'$ /ml under the posterior aspect of the acromion. The injection site was cleansed with topical isopropyl alcohol and a dressing was applied.  Complications:  None; patient tolerated the procedure well.     Commons side effects, risks, benefits, and alternatives for medications and treatment plan prescribed today were discussed, and the patient expressed understanding of the given instructions. Patient is instructed to call or  message via MyChart if he/she has any questions or concerns regarding our treatment plan. No barriers to understanding were identified. We discussed Red Flag symptoms and signs in detail. Patient expressed understanding regarding what to do in case of urgent or emergency type symptoms.  Medication list was reconciled, printed and provided to the patient in AVS. Patient instructions and summary information was reviewed with the patient as documented in the AVS. This note was prepared with assistance of Dragon voice recognition software. Occasional wrong-word or sound-a-like substitutions may have occurred due to the inherent limitations of voice recognition software  This visit occurred during the SARS-CoV-2 public health emergency.  Safety protocols were in place, including screening questions prior to the visit, additional usage of staff PPE, and extensive cleaning of exam room while observing appropriate contact time as indicated for disinfecting solutions.

## 2022-10-20 NOTE — Patient Instructions (Addendum)
Please return if not improving.   We will call you to get you set up for physical therapy.   If you have any questions or concerns, please don't hesitate to send me a message via MyChart or call the office at [phone]. Thank you for visiting with [place] today! It's our pleasure caring for you.   You had a steroid injection today.   Things to be aware of after this injection are listed below: You may experience no significant improvement or even a slight worsening in your symptoms during the first 24 to 48 hours.  After that we expect your symptoms to improve gradually over the next 2 weeks for the medicine to have its maximal effect.  You should continue to have improvement out to 6 weeks after your injection. I recommend icing the site of the injection for 20 minutes  1-2 times the day of your injection You may shower but no swimming, tub bath or Jacuzzi for 24 hours. If your bandage falls off this does not need to be replaced.  It is appropriate to remove the bandage after 4 hours. You may resume light activities as tolerated.     POSSIBLE PROCEDURE SIDE EFFECTS: The side effects of the injection are usually fairly minimal however if you may experience some of the following side effects that are usually self-limited and will is off on their own.  If you are concerned please feel free to call the office with questions:             Increased numbness or tingling             Nausea or vomiting             Swelling or bruising at the injection site    Please call our office if if you experience any of the following symptoms over the next week as these can be signs of infection:              Fever greater than 100.22F             Significant swelling at the injection site             Significant redness or drainage from the injection site    Rotator Cuff Tear  A rotator cuff tear is a partial or complete tear of the cord-like bands (tendons) that connect muscle to bone in the rotator cuff.  The rotator cuff is a group of muscles and tendons that surround the shoulder joint and keep the upper arm bone (humerus) in the shoulder socket. The tear can occur suddenly (acute tear) or can develop over a long period of time (chronic tear). What are the causes? Acute tears may be caused by: A fall, especially on an outstretched arm. Lifting very heavy objects with a jerking motion. Chronic tears may be caused by overuse of the muscles. This may happen in sports, physical work, or activities in which your arm repeatedly moves over your head. What increases the risk? This condition is more likely to occur in: Athletes and workers who frequently use their shoulder or reach over their head. This may include activities such as: Tennis. Baseball and softball. Swimming and rowing. Weight lifting. Construction work. Painting. People who smoke. Older people who have arthritis or poor blood supply. These can make the muscles and tendons weaker. What are the signs or symptoms? Symptoms of this condition depend on the type and severity of the injury: An acute tear may include  a sudden tearing feeling, followed by severe pain that goes from your upper shoulder, down your arm, and toward your elbow. A chronic tear includes a gradual weakness and decreased shoulder motion as the pain gets worse. The pain is usually worse at night. Both types may have symptoms such as: Pain that spreads (radiates) from the shoulder to the upper arm. Swelling and tenderness in front of the shoulder. Decreased range of motion. Pain when: Reaching, pulling, or lifting the arm above the head. Lowering the arm from above the head. Not being able to raise your arm out to the side. Difficulty placing the arm behind your back. How is this diagnosed? This condition is diagnosed with a medical history and physical exam. Imaging tests may also be done, including: X-rays. MRI. Ultrasound. CT or MR arthrogram. During this  test, a contrast material is injected into your shoulder, and then images are taken. How is this treated? Treatment for this condition depends on the type and severity of the condition. In less severe cases, treatment may include: Rest. This may be done with a sling that holds the shoulder still (immobilization). Your health care provider may also recommend avoiding activities that involve lifting your arm over your head. Icing the shoulder. Anti-inflammatory medicines, such as aspirin or ibuprofen. Strengthening and stretching exercises. Your health care provider may recommend specific exercises to improve your range of motion and strengthen your shoulder. In more severe cases, treatment may include: Physical therapy. Steroid injections. Surgery. Follow these instructions at home: Managing pain, stiffness, and swelling     If directed, put ice on the injured area. To do this: If you have a removable sling, remove it as told by your health care provider. Put ice in a plastic bag. Place a towel between your skin and the bag. Leave the ice on for 20 minutes, 2-3 times a day. Remove the ice if your skin turns bright red. This is very important. If you cannot feel pain, heat, or cold, you have a greater risk of damage to the area. Raise (elevate) the injured area above the level of your heart while you are lying down. Find a comfortable sleeping position or sleep on a recliner, if available. Move your fingers often to reduce stiffness and swelling. Once the swelling has gone down, your health care provider may direct you to apply heat to relax the muscles. Use the heat source that your health care provider recommends, such as a moist heat pack or a heating pad. Place a towel between your skin and the heat source. Leave the heat on for 20-30 minutes. Remove the heat if your skin turns bright red. This is especially important if you are unable to feel pain, heat, or cold. You have a greater  risk of getting burned. If you have a sling: Wear the sling as told by your health care provider. Remove it only as told by your health care provider. Loosen the sling if your fingers tingle, become numb, or turn cold and blue. Keep the sling clean. If the sling is not waterproof: Do not let it get wet. Cover it with a watertight covering when you take a bath or a shower. Activity Rest your shoulder as told by your health care provider. Return to your normal activities as told by your health care provider. Ask your health care provider what activities are safe for you. Ask your health care provider when it is safe to drive if you have a sling on your arm.  Do any exercises or stretches as told by your health care provider. General instructions Take over-the-counter and prescription medicines only as told by your health care provider. Do not use any products that contain nicotine or tobacco, such as cigarettes, e-cigarettes, and chewing tobacco. If you need help quitting, ask your health care provider. Keep all follow-up visits. This is important. Contact a health care provider if: Your pain gets worse. You have new pain in your arm, hands, or fingers. Medicine does not help your pain. Get help right away if: Your arm, hand, or fingers are numb or tingling. Your arm, hand, or fingers are swollen or painful or they turn white or blue. Your hand or fingers on your injured arm are colder than your other hand. Summary A rotator cuff tear is a partial or complete tear of the cord-like bands (tendons) that connect muscle to bone in the rotator cuff. The tear can occur suddenly (acute tear) or can develop over a long period of time (chronic tear). Treatment generally includes rest, anti-inflammatory medicines, and icing. In some cases, physical therapy and steroid injections may be needed. In severe cases, surgery may be needed. This information is not intended to replace advice given to you by  your health care provider. Make sure you discuss any questions you have with your health care provider. Document Revised: 03/28/2020 Document Reviewed: 03/28/2020 Elsevier Patient Education  [place].

## 2022-10-22 MED ORDER — TRIAMCINOLONE ACETONIDE 40 MG/ML IJ SUSP
40.0000 mg | Freq: Once | INTRAMUSCULAR | Status: AC
  Administered 2022-10-20: 40 mg via INTRA_ARTICULAR

## 2022-10-22 NOTE — Addendum Note (Signed)
Addended by: Darlina Rumpf A on: 10/22/2022 03:58 PM   Modules accepted: Orders

## 2022-10-23 NOTE — Telephone Encounter (Signed)
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   Plan: check xray to r/o fracture.  Further treatment recs after results If negative, will need office visit for further eval and treatment.

## 2022-11-11 ENCOUNTER — Ambulatory Visit: Payer: Medicare Other | Attending: Family Medicine | Admitting: Physical Therapy

## 2022-11-11 DIAGNOSIS — M25612 Stiffness of left shoulder, not elsewhere classified: Secondary | ICD-10-CM | POA: Diagnosis not present

## 2022-11-11 DIAGNOSIS — M6281 Muscle weakness (generalized): Secondary | ICD-10-CM | POA: Insufficient documentation

## 2022-11-11 DIAGNOSIS — R296 Repeated falls: Secondary | ICD-10-CM | POA: Diagnosis not present

## 2022-11-11 DIAGNOSIS — M75102 Unspecified rotator cuff tear or rupture of left shoulder, not specified as traumatic: Secondary | ICD-10-CM | POA: Diagnosis not present

## 2022-11-11 DIAGNOSIS — M12812 Other specific arthropathies, not elsewhere classified, left shoulder: Secondary | ICD-10-CM | POA: Diagnosis not present

## 2022-11-11 DIAGNOSIS — M25512 Pain in left shoulder: Secondary | ICD-10-CM | POA: Diagnosis not present

## 2022-11-11 NOTE — Therapy (Unsigned)
OUTPATIENT PHYSICAL THERAPY SHOULDER EVALUATION   Patient Name: Sherry Zimmerman MRN: [id number] DOB:November 03, 1956, 66 y.o., female Today's Date: 11/12/2022  END OF SESSION:  PT End of Session - 11/11/22 1531     Visit Number 1    Number of Visits 12    Date for PT Re-Evaluation 01/06/23    Authorization Type [employer] Medicare    Progress Note Due on Visit 10    PT Start Time 1531    PT Stop Time 1615    PT Time Calculation (min) 44 min    Activity Tolerance Patient tolerated treatment well    Behavior During Therapy WFL for tasks assessed/performed             Past Medical History:  Diagnosis Date   Anxiety    AR (allergic rhinitis)    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   Osteoporosis 07/08/2021   DEXA 03/2021: lowest T = -3.3, L femur; forearm -2.2; started tx 07/2021   Raynaud disease 05/13/2018   feet   Splenic rupture 10/26/14   Past Surgical History:  Procedure Laterality Date   ABDOMINAL HYSTERECTOMY     CATARACT EXTRACTION Bilateral 01/17/2020   SPLENECTOMY, TOTAL N/A 10/26/2014   Procedure: SPLENECTOMY;  Surgeon: Georganna Skeans, MD;  Location: [facility] OR;  Service: General;  Laterality: N/A;   WRIST FRACTURE SURGERY Right    Patient Active Problem List   Diagnosis Date Noted   Osteoporosis 07/08/2021   Former smoker 07/08/2021   On statin therapy due to risk of future cardiovascular event 11/18/2019   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   Migraine without aura and without status migrainosus, not intractable 01/10/2019   Major depression, recurrent, chronic ([place]) 08/13/2018   Acquired iron deficiency anemia due to decreased absorption 06/18/2018    Raynaud disease 05/13/2018   Emphysema of lung ([place]) 05/13/2018   AR (allergic rhinitis) 01/08/2018   OAB (overactive bladder) 11/09/2014   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  She reports constant falls due to cerebellar ataxia, almost daily.   She reports she tends to have more falls in evening.  Didn't realize she even hurt her shoulder for a few days, then it started hurting when laying on it, waking at night.  Cortisone shot hurt and then it helped.   PERTINENT HISTORY: Steroid injection L shoulder 10/20/22 for possible partial tear L RTC  Anxiety and depression, cerebellar ataxia, Raynaud disease (feet), osteoporosis, emphysema (former smoker)  PAIN:  Are you having pain? Yes: NPRS scale: 3/10 Pain location: L shoulder  Pain description: hurt all the time, now better, just tender Aggravating factors: lifting - can't lift > 16 oz due to pain,  Relieving factors: motrin, steroid shot  PRECAUTIONS: Fall  WEIGHT BEARING RESTRICTIONS: No  FALLS:  Has patient fallen in last 6 months? Yes. Number of falls at least 15, falls almost daily  LIVING ENVIRONMENT: Lives with: lives with their spouse Lives in: House/apartment Stairs: Yes: External: 1 steps; on right  going up Has following equipment at home: Environmental consultant - 4 wheeled, Shower bench, Grab bars, and 3 wheeled walker  OCCUPATION: Permanent disability  PLOF: Independent with household mobility with device  PATIENT GOALS: improve shoulder pain  NEXT MD VISIT:  no follow-up scheduled  OBJECTIVE:   DIAGNOSTIC FINDINGS:  10/10/22 DG Shoulder left FINDINGS: There is no evidence of fracture or dislocation. There is no evidence of arthropathy or other focal bone abnormality. Soft tissues are unremarkable.  PATIENT SURVEYS:  Quick Dash 56.8% impairment  COGNITION: Overall cognitive status: Within functional limits for tasks assessed  OBSERVATION: Patient enters with 4WRW, bil genu valugum, knee hyperextension in stance phase, accompanied by fiance.  Decreased muscle bulk noted throughout, no apparent distress, however winces with L arm movements, and poor motor control noted raising arm.      SENSATION: Light touch: WFL  POSTURE: Sacral sits, rounded shoulders, forward head.    UPPER EXTREMITY ROM:   Active ROM Right eval Left eval  Shoulder flexion 135 90*  Shoulder extension 90 50*  Shoulder abduction 130 90*  Shoulder adduction    Shoulder internal rotation To T6 Unable due to pain  Shoulder external rotation To Right scapula Several tries need to bring to L shoulder, limited ER *  (Blank rows = not tested) *increased pain  UPPER EXTREMITY MMT:  MMT Right eval Left eval  Shoulder flexion 5 4+*  Shoulder extension    Shoulder abduction 5 3*  Shoulder internal rotation 5 4+*  Shoulder external rotation 5 4+*  Elbow flexion 5 4+*  Elbow extension 5 5  Wrist flexion 5 5  Wrist extension 5 5  Grip strength  good good  (Blank rows = not tested)  *pain with resistance  SHOULDER SPECIAL TESTS: Impingement tests: Painful arc test: positive   JOINT MOBILITY TESTING:  NT  PALPATION:  Increased pain over L corocoid process, biceps and RTC insertion   TODAY'S TREATMENT:  DATE:   11/11/2022 Modalities: [place] x 8 min to L biceps 1 MHZ, 1.2 w/cm2 to continuous to decrease pain.   PATIENT EDUCATION: Education details: findings, POC Person educated: Patient Education method: Explanation Education comprehension: verbalized understanding  HOME EXERCISE PROGRAM: TBA  ASSESSMENT:  CLINICAL IMPRESSION: Patient is a 66 y.o. left hand dominant female who was seen today for physical therapy evaluation and treatment for possible Left RTC tear arthropathy following fall.  She demonstrates pain and tenderness over L biceps tendon attachment at coracoid process and L biceps.  She has cerebellar ataxia and significant postural impairments due to generalized muscle weakness.  She would benefit from skilled physical therapy to decrease L shoulder pain and improve ability to perform ADLs.  Today performed US to L biceps to improve healing and decrease pain.  We also discussed when L shoulder pain improves, also addressing balance and LE strength deficits to decrease falls as well.  She was in agreement with plan.     OBJECTIVE IMPAIRMENTS: Abnormal gait, decreased activity tolerance, decreased balance, decreased coordination, decreased endurance, decreased mobility, difficulty walking, decreased ROM, decreased strength, decreased safety awareness, increased muscle spasms, impaired UE functional use, postural dysfunction, and pain.   ACTIVITY LIMITATIONS: carrying, lifting, bending, sleeping, transfers, bed mobility, dressing, reach over head, hygiene/grooming, and locomotion level  PARTICIPATION LIMITATIONS: meal prep, cleaning, laundry, and community activity  PERSONAL FACTORS: Fitness and 3+ comorbidities: Anxiety and depression, cerebellar ataxia, Raynaud disease (feet), osteoporosis, emphysema (former smoker)  are also affecting patient's  functional outcome.   REHAB POTENTIAL: Good  CLINICAL DECISION MAKING: Evolving/moderate complexity  EVALUATION COMPLEXITY: Moderate  GOALS: Goals reviewed with patient? Yes  SHORT TERM GOALS: Target date: 11/25/2022   Patient will be independent with initial HEP.  Baseline: needs Goal status: INITIAL  2.  Patient will have LE strength/functional strength evaluated.   Baseline: not performed today.  Goal status: INITIAL  3.  Patient will complete TUG.  Baseline: not performed today Goal status: INITIAL   LONG TERM GOALS: Target date: 01/06/2023   Patient will be independent with advanced/ongoing HEP to improve outcomes and carryover.  Baseline:  Goal status: INITIAL  2.  Patient will report 75% improvement in left shoulder pain to improve QOL.  Baseline:  Goal status: INITIAL  3.  Patient to improve Left shoulder AROM to [facility] without pain provocation to allow for increased ease of ADLs.  Baseline: see objective Goal status: INITIAL  4.  Patient will report at least 10% improvement on QuickDash  to demonstrate improved functional ability.  Baseline: 56.8% disability Goal status: INITIAL  6.  Patient will be able to reach behind head and back without increased pain to perform ADLs.     Baseline: unable due to pain Goal status: INITIAL   7. Patient will demonstrate improved functional LE strength with goal TBD to decrease risk of falls.   Baseline: not assessed Goal status: INITIAL  8. Patient will be able to complete TUG in less than 25 seconds with AD in order to decrease fall risk.   Baseline: NT Goal status: INITIAL   PLAN:  PT FREQUENCY: 1-2x/week  PT DURATION: 8 weeks  PLANNED INTERVENTIONS: Therapeutic exercises, Therapeutic activity, Neuromuscular re-education, Balance training, Gait training, Patient/Family education, Self Care, Joint mobilization, Dry Needling, Electrical stimulation, Spinal mobilization, Cryotherapy, Moist heat, Taping, Ultrasound,  Ionotophoresis '4mg'$ /ml Dexamethasone, Manual therapy, and Re-evaluation  PLAN FOR NEXT SESSION: needs initial HEP (isometrics, light resistance), modalities/manual therapy for L shoulder PRN, will assess LE/TUG/5xSTS, mCTSIB  at later date.    Rennie Natter, PT, DPT  11/12/2022, 10:24 AM

## 2022-11-18 ENCOUNTER — Ambulatory Visit: Payer: Medicare Other

## 2022-11-18 DIAGNOSIS — M25612 Stiffness of left shoulder, not elsewhere classified: Secondary | ICD-10-CM | POA: Diagnosis not present

## 2022-11-18 DIAGNOSIS — M25512 Pain in left shoulder: Secondary | ICD-10-CM | POA: Diagnosis not present

## 2022-11-18 DIAGNOSIS — M6281 Muscle weakness (generalized): Secondary | ICD-10-CM

## 2022-11-18 DIAGNOSIS — M75102 Unspecified rotator cuff tear or rupture of left shoulder, not specified as traumatic: Secondary | ICD-10-CM | POA: Diagnosis not present

## 2022-11-18 DIAGNOSIS — R296 Repeated falls: Secondary | ICD-10-CM

## 2022-11-18 DIAGNOSIS — M12812 Other specific arthropathies, not elsewhere classified, left shoulder: Secondary | ICD-10-CM | POA: Diagnosis not present

## 2022-11-18 NOTE — Therapy (Signed)
OUTPATIENT PHYSICAL THERAPY TREATMENT   Patient Name: Sherry Zimmerman MRN: [id number] DOB:05-09-1956, 66 y.o., female Today's Date: 11/18/2022  END OF SESSION:  PT End of Session - 11/18/22 1552     Visit Number 2    Number of Visits 12    Date for PT Re-Evaluation 01/06/23    Authorization Type [employer] Medicare    Progress Note Due on Visit 10    PT Start Time 1533    PT Stop Time 1614    PT Time Calculation (min) 41 min    Activity Tolerance Patient tolerated treatment well    Behavior During Therapy WFL for tasks assessed/performed              Past Medical History:  Diagnosis Date   Anxiety    AR (allergic rhinitis)    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   Osteoporosis 07/08/2021   DEXA 03/2021: lowest T = -3.3, L femur; forearm -2.2; started tx 07/2021   Raynaud disease 05/13/2018   feet   Splenic rupture 10/26/14   Past Surgical History:  Procedure Laterality Date   ABDOMINAL HYSTERECTOMY     CATARACT EXTRACTION Bilateral 01/17/2020   SPLENECTOMY, TOTAL N/A 10/26/2014   Procedure: SPLENECTOMY;  Surgeon: Georganna Skeans, MD;  Location: [facility] OR;  Service: General;  Laterality: N/A;   WRIST FRACTURE SURGERY Right    Patient Active Problem List   Diagnosis Date Noted   Osteoporosis 07/08/2021   Former smoker 07/08/2021   On statin therapy due to risk of future cardiovascular event 11/18/2019   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   Migraine without aura and without status migrainosus, not intractable 01/10/2019   Major depression, recurrent, chronic ([place]) 08/13/2018   Acquired iron deficiency anemia due to decreased absorption 06/18/2018   Raynaud  disease 05/13/2018   Emphysema of lung ([place]) 05/13/2018   AR (allergic rhinitis) 01/08/2018   OAB (overactive bladder) 11/09/2014   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    PCP: Leamon Arnt, MD   REFERRING PROVIDER: Leamon Arnt, MD   REFERRING DIAG: [phone] (ICD-10-CM) - Left rotator cuff tear arthropathy  THERAPY DIAG:  Acute pain of left shoulder  Stiffness of left shoulder, not elsewhere classified  Muscle weakness (generalized)  Repeated falls  Rationale for Evaluation and Treatment: Rehabilitation  ONSET DATE: October , 2023   SUBJECTIVE:  SUBJECTIVE STATEMENT: Just a little pain today  PERTINENT HISTORY: Steroid injection L shoulder 10/20/22 for possible partial tear L RTC  Anxiety and depression, cerebellar ataxia, Raynaud disease (feet), osteoporosis, emphysema (former smoker)  PAIN:  Are you having pain? Yes: NPRS scale: 3/10 Pain location: L shoulder  Pain description: hurt all the time, now better, just tender Aggravating factors: lifting - can't lift > 16 oz due to pain,  Relieving factors: motrin, steroid shot  PRECAUTIONS: Fall  WEIGHT BEARING RESTRICTIONS: No  FALLS:  Has patient fallen in last 6 months? Yes. Number of falls at least 15, falls almost daily  LIVING ENVIRONMENT: Lives with: lives with their spouse Lives in: House/apartment Stairs: Yes: External: 1 steps; on right going up Has following equipment at home: Environmental consultant - 4 wheeled, Shower bench, Grab bars, and 3 wheeled walker  OCCUPATION: Permanent disability  PLOF: Independent with household mobility with device  PATIENT GOALS: improve shoulder pain  NEXT MD VISIT:  no follow-up scheduled  OBJECTIVE:   DIAGNOSTIC FINDINGS:  10/10/22 DG Shoulder left  FINDINGS: There is no evidence of fracture or dislocation. There is no evidence of arthropathy or other focal bone abnormality. Soft tissues are unremarkable.  PATIENT SURVEYS:  Quick Dash 56.8% impairment  COGNITION: Overall cognitive status: Within functional limits for tasks assessed  OBSERVATION: Patient enters with 4WRW, bil genu valugum, knee hyperextension in stance phase, accompanied by fiance.  Decreased muscle bulk noted throughout, no apparent distress, however winces with L arm movements, and poor motor control noted raising arm.      SENSATION: Light touch: WFL  POSTURE: Sacral sits, rounded shoulders, forward head.    UPPER EXTREMITY ROM:   Active ROM Right eval Left eval  Shoulder flexion 135 90*  Shoulder extension 90 50*  Shoulder abduction 130 90*  Shoulder adduction    Shoulder internal rotation To T6 Unable due to pain  Shoulder external rotation To Right scapula Several tries need to bring to L shoulder, limited ER *  (Blank rows = not tested) *increased pain  UPPER EXTREMITY MMT:  MMT Right eval Left eval  Shoulder flexion 5 4+*  Shoulder extension    Shoulder abduction 5 3*  Shoulder internal rotation 5 4+*  Shoulder external rotation 5 4+*  Elbow flexion 5 4+*  Elbow extension 5 5  Wrist flexion 5 5  Wrist extension 5 5  Grip strength  good good  (Blank rows = not tested)  *pain with resistance  SHOULDER SPECIAL TESTS: Impingement tests: Painful arc test: positive   JOINT MOBILITY TESTING:  NT  PALPATION:  Increased pain over L corocoid process, biceps and RTC insertion   TODAY'S TREATMENT:                                                                                                                                         DATE:   11/18/22 TherEx Seated  shoulder rolls x 15 Seated shoulder isometrics flexion, ER/IR 10x5" hold Seated table slides flexion and scaption x 10 each direction Seated scap retraction 10x5"  Manual  Therapy: STM to L ant/middle deltoid, proximal biceps  11/11/2022 Modalities: [place] x 8 min to L biceps 1 MHZ, 1.2 w/cm2 to continuous to decrease pain.   PATIENT EDUCATION: Education details: findings, POC Person educated: Patient Education method: Explanation Education comprehension: verbalized understanding  HOME EXERCISE PROGRAM: Access Code: [email or web address] URL: [email or web address] Date: 11/18/2022 Prepared by: Clarene Essex  Exercises - Seated Shoulder Rolls  - 1 x daily - 7 x weekly - 2 sets - 10 reps - Seated Isometric Shoulder Internal Rotation with Towel  - 1 x daily - 7 x weekly - 2 sets - 5 reps - 5-10 sec hold - Seated Isometric Shoulder External Rotation  - 1 x daily - 7 x weekly - 2 sets - 5 reps - 5-10 sec hold - Seated Isometric Shoulder Flexion  - 1 x daily - 7 x weekly - 2 sets - 5 reps - 5-10 sec hold  ASSESSMENT:  CLINICAL IMPRESSION: Pt responded well to the progression of exercises. We focused on shoulder isometrics and postural facilitation with exercises to improve alignment of her shoulders and decrease pain. MT was done to address trigger points and tension in anterior shoulder. She needed cues with all exercises, did not add scapular retraction to HEP d/t increased pain reported.  OBJECTIVE IMPAIRMENTS: Abnormal gait, decreased activity tolerance, decreased balance, decreased coordination, decreased endurance, decreased mobility, difficulty walking, decreased ROM, decreased strength, decreased safety awareness, increased muscle spasms, impaired UE functional use, postural dysfunction, and pain.   ACTIVITY LIMITATIONS: carrying, lifting, bending, sleeping, transfers, bed mobility, dressing, reach over head, hygiene/grooming, and locomotion level  PARTICIPATION LIMITATIONS: meal prep, cleaning, laundry, and community activity  PERSONAL FACTORS: Fitness and 3+ comorbidities: Anxiety and depression, cerebellar ataxia, Raynaud disease (feet),  osteoporosis, emphysema (former smoker)  are also affecting patient's functional outcome.   REHAB POTENTIAL: Good  CLINICAL DECISION MAKING: Evolving/moderate complexity  EVALUATION COMPLEXITY: Moderate  GOALS: Goals reviewed with patient? Yes  SHORT TERM GOALS: Target date: 11/25/2022   Patient will be independent with initial HEP.  Baseline: needs Goal status: INITIAL  2.  Patient will have LE strength/functional strength evaluated.   Baseline: not performed today.  Goal status: INITIAL  3.  Patient will complete TUG.  Baseline: not performed today Goal status: INITIAL   LONG TERM GOALS: Target date: 01/06/2023   Patient will be independent with advanced/ongoing HEP to improve outcomes and carryover.  Baseline:  Goal status: INITIAL  2.  Patient will report 75% improvement in left shoulder pain to improve QOL.  Baseline:  Goal status: INITIAL  3.  Patient to improve Left shoulder AROM to [facility] without pain provocation to allow for increased ease of ADLs.  Baseline: see objective Goal status: INITIAL  4.  Patient will report at least 10% improvement on QuickDash  to demonstrate improved functional ability.  Baseline: 56.8% disability Goal status: INITIAL  6.  Patient will be able to reach behind head and back without increased pain to perform ADLs.     Baseline: unable due to pain Goal status: INITIAL   7. Patient will demonstrate improved functional LE strength with goal TBD to decrease risk of falls.   Baseline: not assessed Goal status: INITIAL  8. Patient will be able to complete TUG in less than 25 seconds with AD in order to decrease fall risk.  Baseline: NT Goal status: INITIAL   PLAN:  PT FREQUENCY: 1-2x/week  PT DURATION: 8 weeks  PLANNED INTERVENTIONS: Therapeutic exercises, Therapeutic activity, Neuromuscular re-education, Balance training, Gait training, Patient/Family education, Self Care, Joint mobilization, Dry Needling, Electrical  stimulation, Spinal mobilization, Cryotherapy, Moist heat, Taping, Ultrasound, Ionotophoresis '4mg'$ /ml Dexamethasone, Manual therapy, and Re-evaluation  PLAN FOR NEXT SESSION: needs initial HEP (isometrics, light resistance), modalities/manual therapy for L shoulder PRN, will assess LE/TUG/5xSTS, mCTSIB at later date.    Artist Pais, PTA 11/18/2022, 4:35 PM

## 2022-11-20 ENCOUNTER — Ambulatory Visit: Payer: Medicare Other | Admitting: Physical Therapy

## 2022-11-20 ENCOUNTER — Encounter: Payer: Self-pay | Admitting: Physical Therapy

## 2022-11-20 DIAGNOSIS — R296 Repeated falls: Secondary | ICD-10-CM

## 2022-11-20 DIAGNOSIS — M25512 Pain in left shoulder: Secondary | ICD-10-CM

## 2022-11-20 DIAGNOSIS — M25612 Stiffness of left shoulder, not elsewhere classified: Secondary | ICD-10-CM

## 2022-11-20 DIAGNOSIS — M6281 Muscle weakness (generalized): Secondary | ICD-10-CM | POA: Diagnosis not present

## 2022-11-20 DIAGNOSIS — M12812 Other specific arthropathies, not elsewhere classified, left shoulder: Secondary | ICD-10-CM | POA: Diagnosis not present

## 2022-11-20 DIAGNOSIS — M75102 Unspecified rotator cuff tear or rupture of left shoulder, not specified as traumatic: Secondary | ICD-10-CM | POA: Diagnosis not present

## 2022-11-20 NOTE — Therapy (Signed)
OUTPATIENT PHYSICAL THERAPY TREATMENT   Patient Name: Sherry Zimmerman MRN: [id number] DOB:Apr 09, 1956, 66 y.o., female Today's Date: 11/20/2022  END OF SESSION:  PT End of Session - 11/20/22 1319     Visit Number 3    Number of Visits 12    Date for PT Re-Evaluation 01/06/23    Authorization Type [employer]HC Medicare    Progress Note Due on Visit 10    PT Start Time 1320    PT Stop Time 1400    PT Time Calculation (min) 40 min    Activity Tolerance Patient tolerated treatment well    Behavior During Therapy WFL for tasks assessed/performed              Past Medical History:  Diagnosis Date   Anxiety    AR (allergic rhinitis)    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   Osteoporosis 07/08/2021   DEXA 03/2021: lowest T = -3.3, L femur; forearm -2.2; started tx 07/2021   Raynaud disease 05/13/2018   feet   Splenic rupture 10/26/14   Past Surgical History:  Procedure Laterality Date   ABDOMINAL HYSTERECTOMY     CATARACT EXTRACTION Bilateral 01/17/2020   SPLENECTOMY, TOTAL N/A 10/26/2014   Procedure: SPLENECTOMY;  Surgeon: Georganna Skeans, MD;  [facility] OR;  Service: General;  Laterality: N/A;   WRIST FRACTURE SURGERY Right    Patient Active Problem List   Diagnosis Date Noted   Osteoporosis 07/08/2021   Former smoker 07/08/2021   On statin therapy due to risk of future cardiovascular event 11/18/2019   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   Migraine without aura and without status migrainosus, not intractable 01/10/2019   Major depression, recurrent, chronic ([place]) 08/13/2018   Acquired iron deficiency anemia due to decreased absorption 06/18/2018   Raynaud  disease 05/13/2018   Emphysema of lung ([place]) 05/13/2018   AR (allergic rhinitis) 01/08/2018   OAB (overactive bladder) 11/09/2014   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    PERTINENT HISTORY: Steroid injection L shoulder 10/20/22 for possible partial tear L RTC  Anxiety and depression, cerebellar ataxia, Raynaud disease (feet), osteoporosis, emphysema (former smoker)  PAIN:  Are you having pain? Yes: NPRS scale: 4/10 Pain location: L shoulder  Pain description: hurt all the time, now better, just tender Aggravating factors: lifting - can't lift > 16 oz due to pain,  Relieving factors: motrin, steroid shot  PRECAUTIONS: Fall  WEIGHT BEARING RESTRICTIONS: No  FALLS:  Has patient fallen in last 6 months? Yes. Number of falls at least 15, falls almost daily  LIVING ENVIRONMENT: Lives with: lives with their spouse Lives in: House/apartment Stairs: Yes: External: 1 steps; on right going up Has following equipment at home: Environmental consultant - 4 wheeled, Shower bench, Grab bars, and 3 wheeled walker  OCCUPATION: Permanent disability  PLOF: Independent with household mobility with device  PATIENT GOALS: improve shoulder  pain  NEXT MD VISIT:  no follow-up scheduled  OBJECTIVE:   DIAGNOSTIC FINDINGS:  10/10/22 DG Shoulder left FINDINGS: There is no evidence of fracture or dislocation. There is no evidence of arthropathy or other focal bone abnormality. Soft tissues are unremarkable.  PATIENT SURVEYS:  Quick Dash 56.8% impairment  COGNITION: Overall cognitive status: Within functional limits for tasks assessed  OBSERVATION: Patient enters with 4WRW, bil genu valugum, knee hyperextension in stance phase, accompanied by fiance.  Decreased muscle bulk noted throughout, no apparent distress, however winces with L arm movements, and poor motor control noted raising arm.      SENSATION: Light touch: WFL  POSTURE: Sacral sits, rounded shoulders, forward head.    UPPER EXTREMITY ROM:   Active ROM Right eval Left eval  Shoulder flexion 135 90*  Shoulder extension 90 50*  Shoulder abduction 130 90*  Shoulder adduction    Shoulder internal rotation To T6 Unable due to pain  Shoulder external rotation To Right scapula Several tries need to bring to L shoulder, limited ER *  (Blank rows = not tested) *increased pain  UPPER EXTREMITY MMT:  MMT Right eval Left eval  Shoulder flexion 5 4+*  Shoulder extension    Shoulder abduction 5 3*  Shoulder internal rotation 5 4+*  Shoulder external rotation 5 4+*  Elbow flexion 5 4+*  Elbow extension 5 5  Wrist flexion 5 5  Wrist extension 5 5  Grip strength  good good  (Blank rows = not tested)  *pain with resistance  SHOULDER SPECIAL TESTS: Impingement tests: Painful arc test: positive   JOINT MOBILITY TESTING:  NT  PALPATION:  Increased pain over L corocoid process, biceps and RTC insertion   TODAY'S TREATMENT:  DATE:  11/20/2022 Therapeutic Exercise: to improve strength and mobility.  Demo, verbal and tactile  cues throughout for technique. Shoulder rolls - throughout session  Seated table slides scaption x 20 L Seated table slides IR x 20 L Seated shoulder isometrics  L flexion, elbow extension, ER/IR 5x5" hold each,  Seated scap retraction 2 x 5 5 sec hold Putty pulls/twists - yellow Modalities: [place] x 8 min to L biceps 1 MHZ, 1.2 w/cm2 to continuous to decrease pain. Manual Therapy: to decrease muscle spasm and pain and improve mobility STM/TPR to L UT, levator scapule, cervical paraspinals and rhomboids.   11/18/22 TherEx Seated shoulder rolls x 15 Seated shoulder isometrics flexion, ER/IR 10x5" hold Seated table slides flexion and scaption x 10 each direction Seated scap retraction 10x5"  Manual Therapy: STM to L ant/middle deltoid, proximal biceps  11/11/2022 Modalities: [place] x 8 min to L biceps 1 MHZ, 1.2 w/cm2 to continuous to decrease pain.   PATIENT EDUCATION: Education details: review HEP, issued Yellow theraputty Person educated: Patient Education method: Explanation Education comprehension: verbalized understanding  HOME EXERCISE PROGRAM: Access Code: [email or web address] URL: [email or web address] Date: 11/20/2022 Prepared by: Glenetta Hew  Exercises - Seated Shoulder Rolls  - 1 x daily - 7 x weekly - 2 sets - 10 reps - Seated Isometric Shoulder Internal Rotation with Towel  - 1 x daily - 7 x weekly - 2 sets - 5 reps - 5-10 sec hold - Seated Isometric Shoulder External Rotation  - 1 x daily - 7 x weekly - 2 sets - 5 reps - 5-10 sec hold - Seated Isometric Shoulder Flexion  - 1 x daily - 7 x weekly - 2 sets - 5 reps - 5-10 sec hold - Seated Isometric Elbow Extension  - 1 x daily - 7 x weekly - 2 sets - 5 reps - 5 - 10 sec  hold - Seated Scapular Retraction  - 1 x daily - 7 x weekly - 2 sets - 5 reps - 5-10 sec  hold - Putty Squeezes  - 1 x daily - 7 x weekly - 3 sets - 10 reps  ASSESSMENT:  CLINICAL IMPRESSION: Patient initially reported increased shoulder  pain, but the reported that she did not feel exercises or interventions had aggravated it.  She reports good compliance with initial HEP.  Today progressed exercises, cuing for form especially with table slides, to use body and not not push into painful ROM, afterwards reported decreased discomfort.  Added yellow therapy putty to work biceps, followed by manual therapy to periscapular and cervical muscles to decrease muscle tension and tightness, and [place] to L biceps to decrease pain.  Emmerie Adrian continues to demonstrate potential for improvement and would benefit from continued skilled therapy to address impairments.    OBJECTIVE IMPAIRMENTS: Abnormal gait, decreased activity tolerance, decreased balance, decreased coordination, decreased endurance, decreased mobility, difficulty walking, decreased ROM, decreased strength, decreased safety awareness, increased muscle spasms, impaired UE functional use, postural dysfunction, and pain.   ACTIVITY LIMITATIONS: carrying, lifting, bending, sleeping, transfers, bed mobility, dressing, reach over head, hygiene/grooming, and locomotion level  PARTICIPATION LIMITATIONS: meal prep, cleaning, laundry, and community activity  PERSONAL FACTORS: Fitness and 3+ comorbidities: Anxiety and depression, cerebellar ataxia, Raynaud disease (feet), osteoporosis, emphysema (former smoker)  are also affecting patient's functional outcome.   REHAB POTENTIAL: Good  CLINICAL DECISION MAKING: Evolving/moderate complexity  EVALUATION COMPLEXITY: Moderate  GOALS: Goals reviewed with patient? Yes  SHORT TERM GOALS: Target date: 11/25/2022   Patient will  be independent with initial HEP.  Baseline: needs Goal status: IN PROGRESS 11/20/22- reports compliance, cues needed  2.  Patient will have LE strength/functional strength evaluated.   Baseline: not performed today.  Goal status: INITIAL  3.  Patient will complete TUG.  Baseline: not performed today Goal status:  INITIAL   LONG TERM GOALS: Target date: 01/06/2023   Patient will be independent with advanced/ongoing HEP to improve outcomes and carryover.  Baseline:  Goal status: IN PROGRESS  2.  Patient will report 75% improvement in left shoulder pain to improve QOL.  Baseline:  Goal status: IN PROGRESS  3.  Patient to improve Left shoulder AROM to [facility] without pain provocation to allow for increased ease of ADLs.  Baseline: see objective Goal status: IN PROGRESS  4.  Patient will report at least 10% improvement on QuickDash  to demonstrate improved functional ability.  Baseline: 56.8% disability Goal status: IN PROGRESS  6.  Patient will be able to reach behind head and back without increased pain to perform ADLs.     Baseline: unable due to pain Goal status: IN PROGRESS   7. Patient will demonstrate improved functional LE strength with goal TBD to decrease risk of falls.   Baseline: not assessed Goal status: INITIAL  8. Patient will be able to complete TUG in less than 25 seconds with AD in order to decrease fall risk.   Baseline: NT Goal status: IN PROGRESS   PLAN:  PT FREQUENCY: 1-2x/week  PT DURATION: 8 weeks  PLANNED INTERVENTIONS: Therapeutic exercises, Therapeutic activity, Neuromuscular re-education, Balance training, Gait training, Patient/Family education, Self Care, Joint mobilization, Dry Needling, Electrical stimulation, Spinal mobilization, Cryotherapy, Moist heat, Taping, Ultrasound, Ionotophoresis '4mg'$ /ml Dexamethasone, Manual therapy, and Re-evaluation  PLAN FOR NEXT SESSION: continue to progress L shoulder strength and ROM as tolerated,  modalities/manual therapy for L shoulder PRN, will assess LE/TUG/5xSTS, mCTSIB at later date.    Rennie Natter, PT, DPT 11/20/2022, 3:09 PM

## 2022-11-25 ENCOUNTER — Ambulatory Visit: Payer: Medicare Other

## 2022-11-27 ENCOUNTER — Encounter: Payer: Medicare Other | Admitting: Physical Therapy

## 2022-12-02 ENCOUNTER — Ambulatory Visit: Payer: Medicare Other

## 2022-12-04 IMAGING — CT CT CHEST LUNG CANCER SCREENING LOW DOSE W/O CM
2 of 3 series · 15 of 36 positions shown, 18 images · non-contrast
Comparison: Low-dose lung cancer screening chest CT 08/06/2020.

CLINICAL DATA: 65-year-old female former smoker (quit in March 2021) with 51 pack-year history of smoking. Lung cancer screening
examination.

EXAM:
CT CHEST WITHOUT CONTRAST LOW-DOSE FOR LUNG CANCER SCREENING
TECHNIQUE: Multidetector CT imaging of the chest was performed following the
standard protocol without IV contrast.

[Series 2: axial st · axial · 0.68mm/px · z∈[-280,-40]mm · 12 of 58 slices shown, 15 images]
[im 5/58  mediastinal]
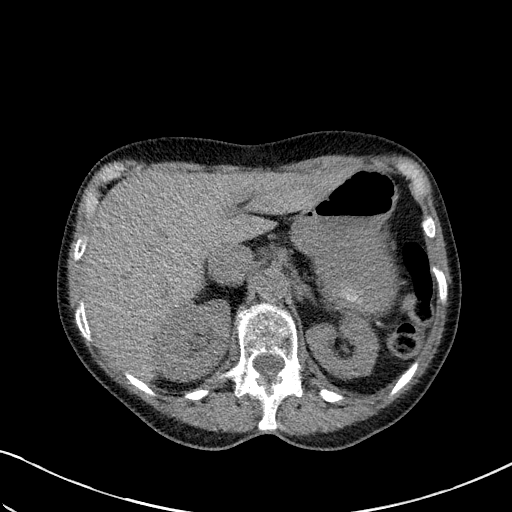
[im 5/58  lung]
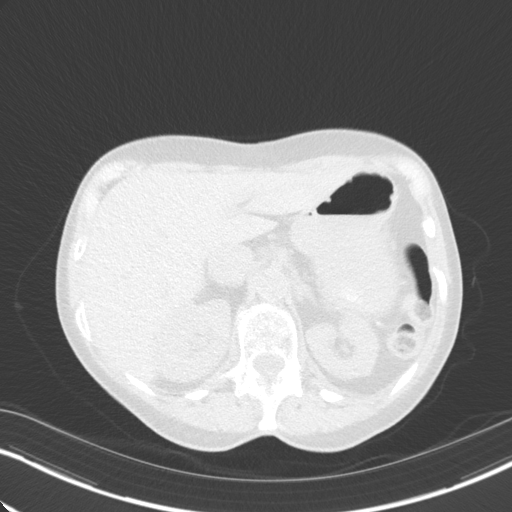
[im 9/58  lung]
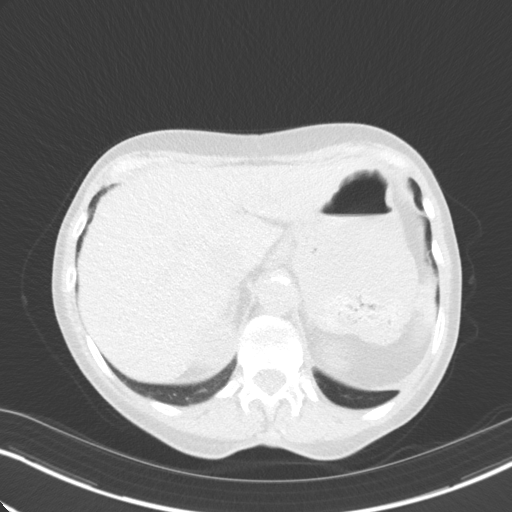
[im 13/58  lung]
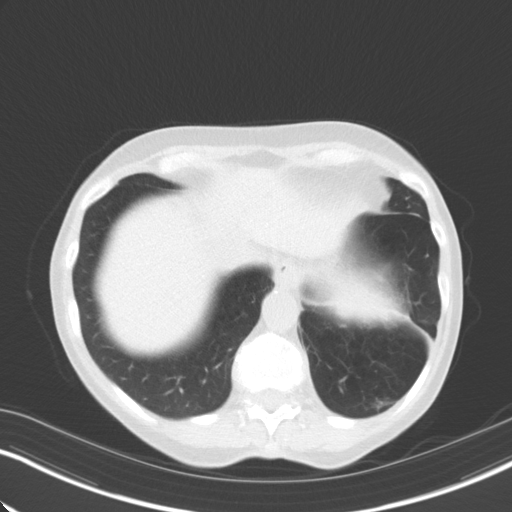
[im 17/58  lung]
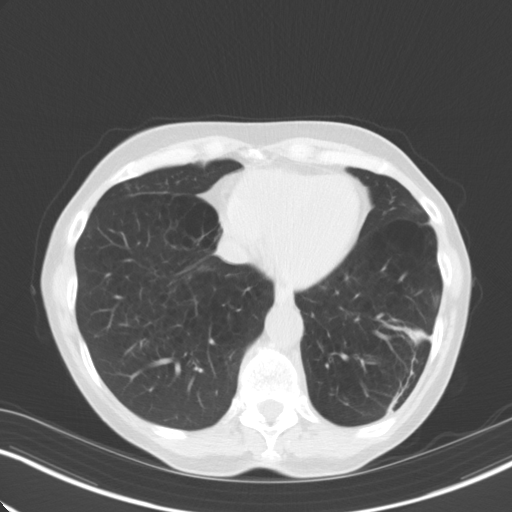
[im 22/58  mediastinal]
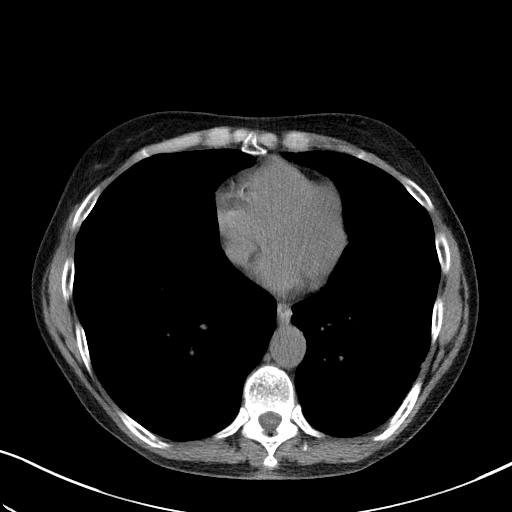
[im 22/58  lung]
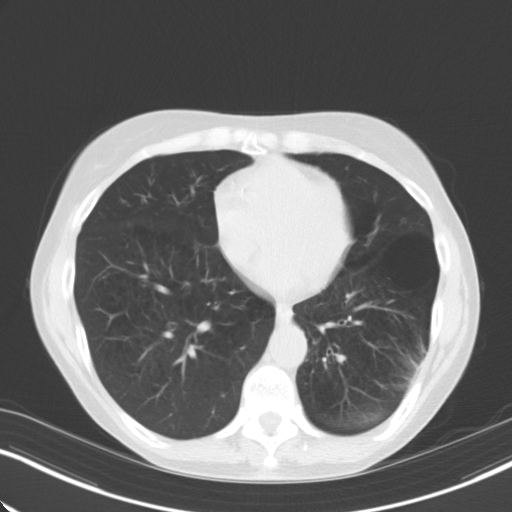
[im 26/58  lung]
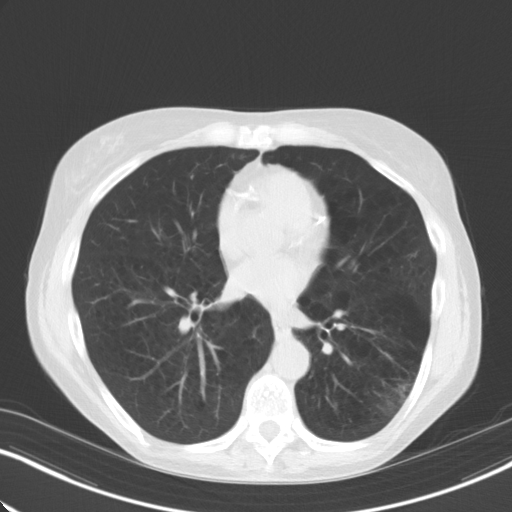
[im 32/58  lung]
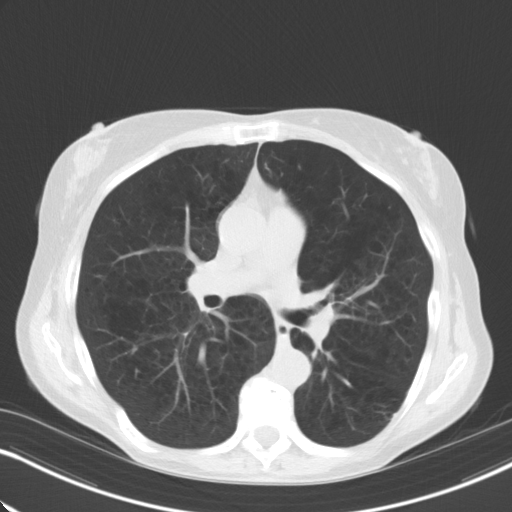
[im 36/58  lung]
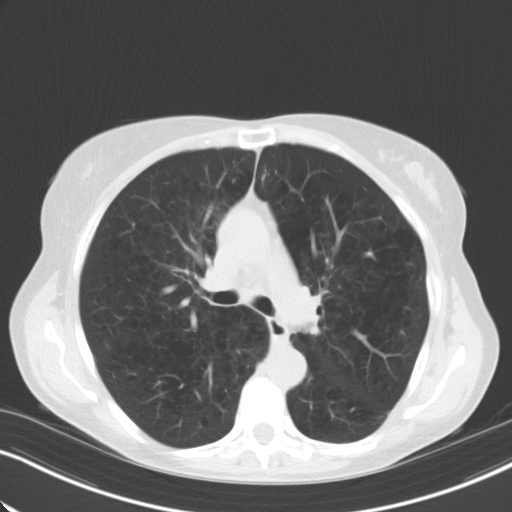
[im 41/58  mediastinal]
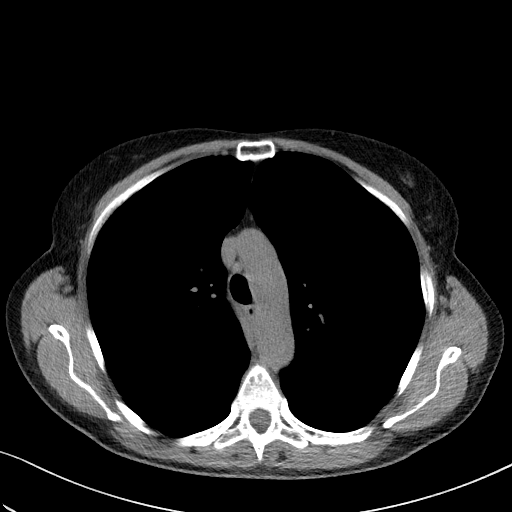
[im 41/58  lung]
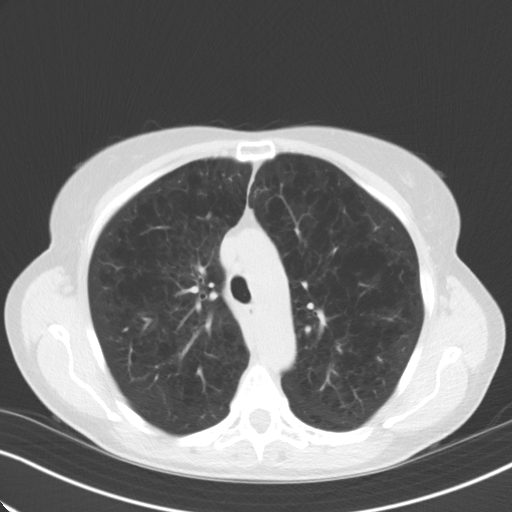
[im 45/58  lung]
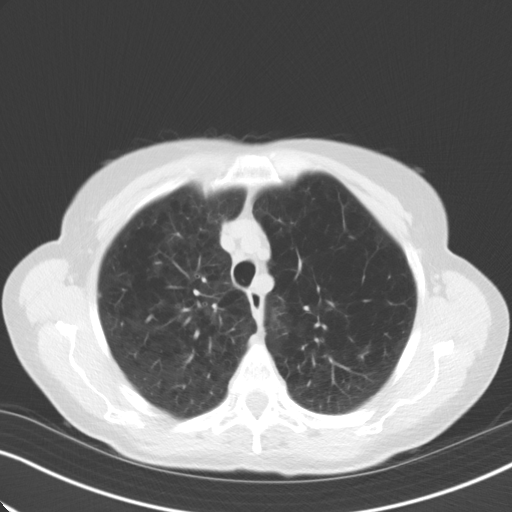
[im 49/58  lung]
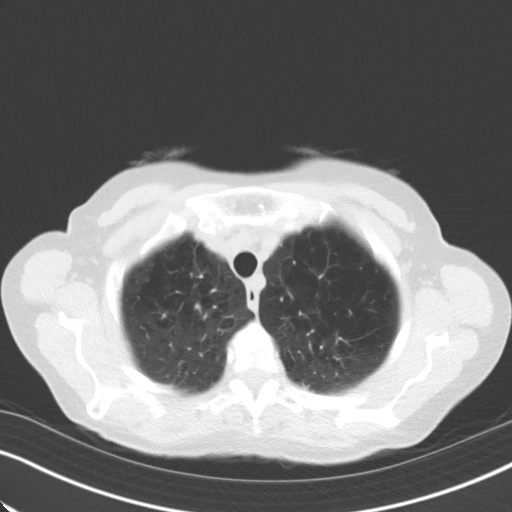
[im 53/58  lung]
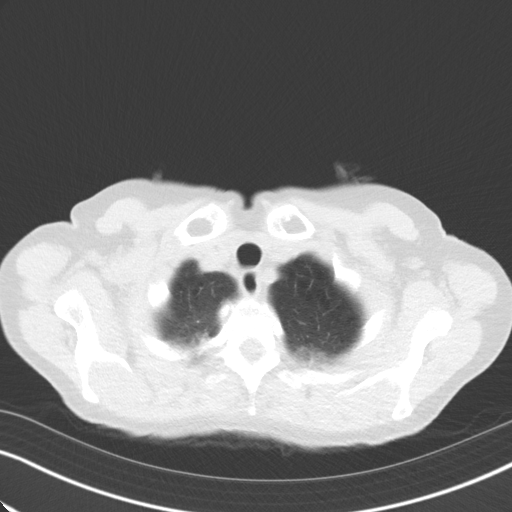

[Series 5: coronal · coronal · 0.60mm/px · 3 of 246 slices shown]
[im 50/246  lung]
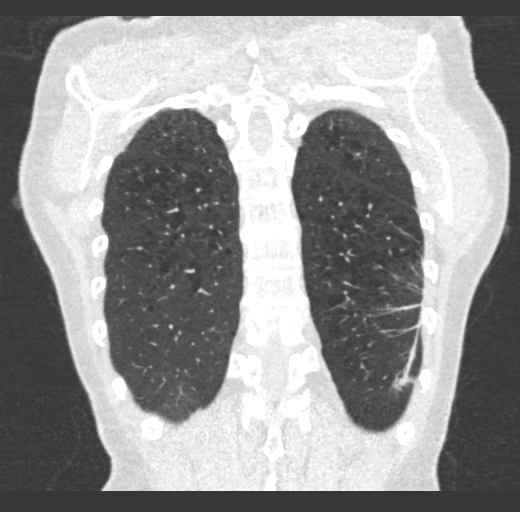
[im 99/246  lung]
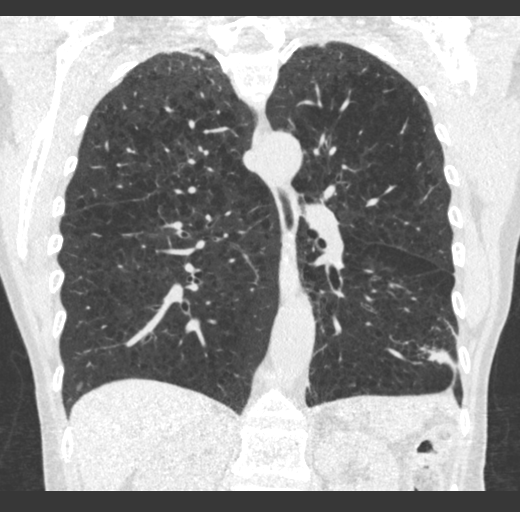
[im 148/246  lung]
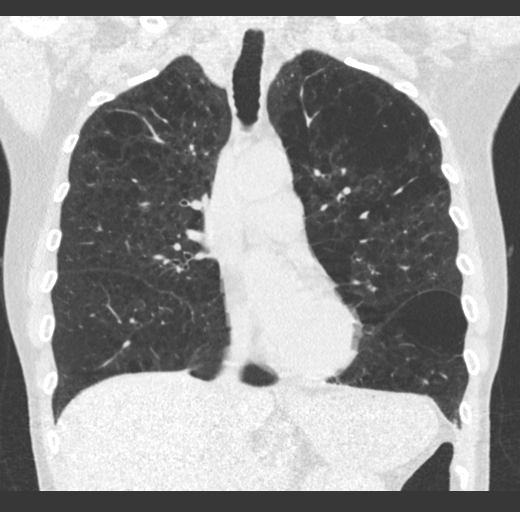

[15 of 36 positions shown; findings below may reference images not displayed]

FINDINGS: Cardiovascular: Heart size is normal. There is no significant
pericardial fluid, thickening or pericardial calcification. There is
aortic atherosclerosis, as well as atherosclerosis of the great
vessels of the mediastinum and the coronary arteries, including
calcified atherosclerotic plaque in the left main, left anterior
descending and right coronary arteries.

Mediastinum/Nodes: No pathologically enlarged mediastinal or hilar
lymph nodes. Please note that accurate exclusion of hilar adenopathy
is limited on noncontrast CT scans. Esophagus is unremarkable in
appearance. No axillary lymphadenopathy.

Lungs/Pleura: Multiple small pulmonary nodules are noted in the
lungs bilaterally, similar in size, number and distribution to the
prior study, largest of which is in the posterior aspect of the
right upper lobe (axial image 75 of series 3), with a volume derived
mean diameter 4.2 mm. No larger more suspicious appearing pulmonary
nodules or masses are noted. No acute consolidative airspace
disease. No pleural effusions. Diffuse bronchial wall thickening
with moderate centrilobular and paraseptal emphysema.

Upper Abdomen: Aortic atherosclerosis.

Musculoskeletal: There are no aggressive appearing lytic or blastic
lesions noted in the visualized portions of the skeleton.
IMPRESSION: 1. Lung-RADS 2S, benign appearance or behavior. Continue annual
screening with low-dose chest CT without contrast in 12 months.
2. The "S" modifier above refers to potentially clinically
significant non lung cancer related findings. Specifically, there is
aortic atherosclerosis, in addition to left main and 2 vessel
coronary artery disease. Please note that although the presence of
coronary artery calcium documents the presence of coronary artery
disease, the severity of this disease and any potential stenosis
cannot be assessed on this non-gated CT examination. Assessment for
potential risk factor modification, dietary therapy or pharmacologic
therapy may be warranted, if clinically indicated.
3. Mild diffuse bronchial wall thickening with moderate
centrilobular and paraseptal emphysema; imaging findings suggestive
of underlying COPD.

Aortic Atherosclerosis (D37JV-43E.E) and Emphysema ([id number].Q).

## 2022-12-09 ENCOUNTER — Encounter: Payer: Medicare Other | Admitting: Physical Therapy

## 2022-12-16 ENCOUNTER — Encounter: Payer: Self-pay | Admitting: Physical Therapy

## 2022-12-16 ENCOUNTER — Ambulatory Visit: Payer: Medicare Other | Attending: Family Medicine | Admitting: Physical Therapy

## 2022-12-16 DIAGNOSIS — M25512 Pain in left shoulder: Secondary | ICD-10-CM

## 2022-12-16 DIAGNOSIS — M25612 Stiffness of left shoulder, not elsewhere classified: Secondary | ICD-10-CM

## 2022-12-16 DIAGNOSIS — M6281 Muscle weakness (generalized): Secondary | ICD-10-CM | POA: Diagnosis not present

## 2022-12-16 DIAGNOSIS — R296 Repeated falls: Secondary | ICD-10-CM

## 2022-12-16 NOTE — Therapy (Signed)
OUTPATIENT PHYSICAL THERAPY TREATMENT   Patient Name: Sherry Zimmerman MRN: [id number] DOB:1956/03/19, 67 y.o., female Today's Date: 12/16/2022  END OF SESSION:  PT End of Session - 12/16/22 1317     Visit Number 4    Number of Visits 12    Date for PT Re-Evaluation 01/06/23    Authorization Type [employer]    Progress Note Due on Visit 10    PT Start Time 1317    PT Stop Time 1400    PT Time Calculation (min) 43 min    Activity Tolerance Patient tolerated treatment well    Behavior During Therapy WFL for tasks assessed/performed              Past Medical History:  Diagnosis Date   Anxiety    AR (allergic rhinitis)    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   Osteoporosis 07/08/2021   DEXA 03/2021: lowest T = -3.3, L femur; forearm -2.2; started tx 07/2021   Raynaud disease 05/13/2018   feet   Splenic rupture 10/26/14   Past Surgical History:  Procedure Laterality Date   ABDOMINAL HYSTERECTOMY     CATARACT EXTRACTION Bilateral 01/17/2020   SPLENECTOMY, TOTAL N/A 10/26/2014   Procedure: SPLENECTOMY;  Surgeon: Georganna Skeans, MD;  Location: [facility] OR;  Service: General;  Laterality: N/A;   WRIST FRACTURE SURGERY Right    Patient Active Problem List   Diagnosis Date Noted   Osteoporosis 07/08/2021   Former smoker 07/08/2021   On statin therapy due to risk of future cardiovascular event 11/18/2019   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   Migraine without aura and without status migrainosus, not intractable 01/10/2019   Major depression, recurrent, chronic ([place]) 08/13/2018   Acquired iron deficiency anemia due to decreased absorption 06/18/2018   Raynaud disease  05/13/2018   Emphysema of lung ([place]) 05/13/2018   AR (allergic rhinitis) 01/08/2018   OAB (overactive bladder) 11/09/2014   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    Reports 1 new fall since last session trying to clean bathroom.    PERTINENT HISTORY: Steroid injection L shoulder 10/20/22 for possible partial tear L RTC  Anxiety and depression, cerebellar ataxia, Raynaud disease (feet), osteoporosis, emphysema (former smoker)  PAIN:  Are you having pain? Yes: NPRS scale: 7/10 Pain location: L shoulder  Pain description: hurt all the time, now better, just tender Aggravating factors: lifting - can't lift > 16 oz due to pain,  Relieving factors: motrin, steroid shot  PRECAUTIONS: Fall  WEIGHT BEARING RESTRICTIONS: No  FALLS:  Has patient fallen in last 6 months? Yes. Number of falls at least 15, falls almost daily  LIVING ENVIRONMENT: Lives with: lives with their spouse Lives in: House/apartment Stairs: Yes: External: 1 steps; on right going up Has following equipment at home: Environmental consultant - 4 wheeled, Shower bench, Grab bars, and 3 wheeled walker  OCCUPATION: Permanent disability  PLOF: Independent with household mobility with device  PATIENT GOALS: improve  shoulder pain  NEXT MD VISIT:  no follow-up scheduled  OBJECTIVE:   DIAGNOSTIC FINDINGS:  10/10/22 DG Shoulder left FINDINGS: There is no evidence of fracture or dislocation. There is no evidence of arthropathy or other focal bone abnormality. Soft tissues are unremarkable.  PATIENT SURVEYS:  Quick Dash 56.8% impairment  COGNITION: Overall cognitive status: Within functional limits for tasks assessed  OBSERVATION: Patient enters with 4WRW, bil genu valugum, knee hyperextension in stance phase, accompanied by fiance.  Decreased muscle bulk noted throughout, no apparent distress, however winces with L arm movements, and poor motor control noted raising arm.      SENSATION: Light touch: WFL  POSTURE: Sacral sits, rounded shoulders, forward head.    UPPER EXTREMITY ROM:   Active ROM Right eval Left eval  Shoulder flexion 135 90*  Shoulder extension 90 50*  Shoulder abduction 130 90*  Shoulder adduction    Shoulder internal rotation To T6 Unable due to pain  Shoulder external rotation To Right scapula Several tries need to bring to L shoulder, limited ER *  (Blank rows = not tested) *increased pain  UPPER EXTREMITY MMT:  MMT Right eval Left eval  Shoulder flexion 5 4+*  Shoulder extension    Shoulder abduction 5 3*  Shoulder internal rotation 5 4+*  Shoulder external rotation 5 4+*  Elbow flexion 5 4+*  Elbow extension 5 5  Wrist flexion 5 5  Wrist extension 5 5  Grip strength  good good  (Blank rows = not tested)  *pain with resistance  SHOULDER SPECIAL TESTS: Impingement tests: Painful arc test: positive   JOINT MOBILITY TESTING:  NT  PALPATION:  Increased pain over L corocoid process, biceps and RTC insertion   TODAY'S TREATMENT:  DATE:  12/16/2022 Therapeutic Exercise: to improve strength and mobility.  Demo, verbal and  tactile cues throughout for technique. UBE L1 x 4 min forward Seated rows YTB 2 x 10 Seated bil shoulder ER  x 20 YTB Seated shoulder IR YTB x 15 YTB Arm press with cane in supine x 20 Shoulder flexion in supine with cane x 15 - cues for slow controlled movement in pain free ROM Manual Therapy: to decrease muscle spasm and pain and improve mobility STM/TPR to L shoulder deltoid and biceps, skilled palpation and monitoring during dry needling. Trigger Point Dry-Needling  Treatment instructions: Expect mild to moderate muscle soreness. S/S of pneumothorax if dry needled over a lung field, and to seek immediate medical attention should they occur. Patient verbalized understanding of these instructions and education. Patient Consent Given: Yes Education handout provided: Yes Muscles treated: L biceps Electrical stimulation performed: No Parameters: N/A Treatment response/outcome: Twitch Response Elicited and Palpable Increase in Muscle Length   11/20/2022 Therapeutic Exercise: to improve strength and mobility.  Demo, verbal and tactile cues throughout for technique. Shoulder rolls - throughout session  Seated table slides scaption x 20 L Seated table slides IR x 20 L Seated shoulder isometrics  L flexion, elbow extension, ER/IR 5x5" hold each,  Seated scap retraction 2 x 5 5 sec hold Putty pulls/twists - yellow Modalities: [place] x 8 min to L biceps 1 MHZ, 1.2 w/cm2 to continuous to decrease pain. Manual Therapy: to decrease muscle spasm and pain and improve mobility STM/TPR to L UT, levator scapule, cervical paraspinals and rhomboids.   11/18/22 TherEx Seated shoulder rolls x 15 Seated shoulder isometrics flexion, ER/IR 10x5" hold Seated table slides flexion and scaption x 10 each direction Seated scap retraction 10x5"  Manual Therapy: STM to L ant/middle deltoid, proximal biceps  11/11/2022 Modalities: [place] x 8 min to L biceps 1 MHZ, 1.2 w/cm2 to continuous to decrease  pain.   PATIENT EDUCATION: Education details: review HEP, issued Yellow theraband Person educated: Patient Education method: Explanation Education comprehension: verbalized understanding  HOME EXERCISE PROGRAM: Access Code: [email or web address] URL: [email or web address] Date: 12/16/2022 Prepared by: Glenetta Hew  Exercises - Seated Shoulder Row with Anchored Resistance  - 1 x daily - 7 x weekly - 2 sets - 10 reps - Seated Shoulder Internal Rotation with Anchored Resistance  - 1 x daily - 7 x weekly - 2 sets - 10 reps - Seated Bilateral Shoulder External Rotation with Resistance  - 1 x daily - 7 x weekly - 2 sets - 10 reps - Supine Shoulder Press with Dowel  - 1 x daily - 7 x weekly - 2 sets - 10 reps - Supine Shoulder Flexion AAROM with Hands Clasped  - 1 x daily - 7 x weekly - 2 sets - 10 reps  ASSESSMENT:  CLINICAL IMPRESSION: Sherry Zimmerman tolerated progression of exercises today without complaint but did need a lot of cuing to perform slowly and in pain free ROM and not jerk, as jerking did increase her pain.  With overhead movements she did better using hands grasped than cane to control L shoulder movement.  She continues to report pain primarily in L biceps, after education agreed to trial of TrDN in addition to manual therapy, tolerated well with less tightness in muscle following.   Sherry Zimmerman continues to demonstrate potential for improvement and would benefit from continued skilled therapy to address impairments.    OBJECTIVE IMPAIRMENTS: Abnormal gait, decreased activity tolerance, decreased balance, decreased coordination, decreased endurance,  decreased mobility, difficulty walking, decreased ROM, decreased strength, decreased safety awareness, increased muscle spasms, impaired UE functional use, postural dysfunction, and pain.   ACTIVITY LIMITATIONS: carrying, lifting, bending, sleeping, transfers, bed mobility, dressing, reach over head, hygiene/grooming,  and locomotion level  PARTICIPATION LIMITATIONS: meal prep, cleaning, laundry, and community activity  PERSONAL FACTORS: Fitness and 3+ comorbidities: Anxiety and depression, cerebellar ataxia, Raynaud disease (feet), osteoporosis, emphysema (former smoker)  are also affecting patient's functional outcome.   REHAB POTENTIAL: Good  CLINICAL DECISION MAKING: Evolving/moderate complexity  EVALUATION COMPLEXITY: Moderate  GOALS: Goals reviewed with patient? Yes  SHORT TERM GOALS: Target date: 11/25/2022   Patient will be independent with initial HEP.  Baseline: needs Goal status: IN PROGRESS 11/20/22- reports compliance, cues needed  2.  Patient will have LE strength/functional strength evaluated.   Baseline: not performed today.  Goal status: INITIAL  3.  Patient will complete TUG.  Baseline: not performed today Goal status: INITIAL   LONG TERM GOALS: Target date: 01/06/2023   Patient will be independent with advanced/ongoing HEP to improve outcomes and carryover.  Baseline:  Goal status: IN PROGRESS  2.  Patient will report 75% improvement in left shoulder pain to improve QOL.  Baseline:  Goal status: IN PROGRESS  3.  Patient to improve Left shoulder AROM to [facility] without pain provocation to allow for increased ease of ADLs.  Baseline: see objective Goal status: IN PROGRESS  4.  Patient will report at least 10% improvement on QuickDash  to demonstrate improved functional ability.  Baseline: 56.8% disability Goal status: IN PROGRESS  6.  Patient will be able to reach behind head and back without increased pain to perform ADLs.     Baseline: unable due to pain Goal status: IN PROGRESS   7. Patient will demonstrate improved functional LE strength with goal TBD to decrease risk of falls.   Baseline: not assessed Goal status: INITIAL  8. Patient will be able to complete TUG in less than 25 seconds with AD in order to decrease fall risk.   Baseline: NT Goal status: IN  PROGRESS   PLAN:  PT FREQUENCY: 1-2x/week  PT DURATION: 8 weeks  PLANNED INTERVENTIONS: Therapeutic exercises, Therapeutic activity, Neuromuscular re-education, Balance training, Gait training, Patient/Family education, Self Care, Joint mobilization, Dry Needling, Electrical stimulation, Spinal mobilization, Cryotherapy, Moist heat, Taping, Ultrasound, Ionotophoresis '4mg'$ /ml Dexamethasone, Manual therapy, and Re-evaluation  PLAN FOR NEXT SESSION: continue to progress L shoulder strength and ROM as tolerated,  modalities/manual therapy for L shoulder PRN, will assess LE/TUG/5xSTS, mCTSIB at later date.    Rennie Natter, PT, DPT 12/16/2022, 3:35 PM

## 2022-12-17 NOTE — Therapy (Incomplete)
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   Osteoporosis 07/08/2021   DEXA 03/2021: lowest T = -3.3, L femur; forearm -2.2; started tx 07/2021   Raynaud disease 05/13/2018   feet   Splenic rupture 10/26/14   Past Surgical History:  Procedure Laterality Date   ABDOMINAL HYSTERECTOMY     CATARACT EXTRACTION Bilateral 01/17/2020   SPLENECTOMY, TOTAL N/A 10/26/2014   Procedure: SPLENECTOMY;  Surgeon: Georganna Skeans, MD;  Location: [facility] OR;  Service: General;  Laterality: N/A;   WRIST FRACTURE SURGERY Right    Patient Active Problem List   Diagnosis Date Noted   Osteoporosis 07/08/2021   Former smoker 07/08/2021   On statin therapy due to risk of future cardiovascular event 11/18/2019   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   Migraine without aura and without status migrainosus, not intractable 01/10/2019   Major depression, recurrent, chronic ([place]) 08/13/2018   Acquired iron deficiency anemia due to decreased absorption 06/18/2018   Raynaud disease 05/13/2018   Emphysema of lung ([place]) 05/13/2018   AR (allergic rhinitis) 01/08/2018   OAB (overactive bladder) 11/09/2014   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  Rationale for Evaluation and Treatment: Rehabilitation  ONSET DATE: October , 2023   SUBJECTIVE:                                                                                                                                                                                      SUBJECTIVE STATEMENT: ***   PERTINENT HISTORY: Steroid injection L shoulder 10/20/22 for possible partial tear L RTC  Anxiety and depression, cerebellar ataxia, Raynaud disease (feet), osteoporosis, emphysema (former smoker)  PAIN:  Are you having pain? Yes: NPRS scale: 7/10 Pain location: L shoulder  Pain description: hurt all the time, now better, just tender Aggravating factors: lifting - can't lift > 16 oz due to pain,  Relieving factors: motrin, steroid shot  PRECAUTIONS: Fall  WEIGHT BEARING RESTRICTIONS: No  FALLS:  Has patient fallen in last 6 months? Yes. Number of falls at least 15, falls almost daily  LIVING ENVIRONMENT: Lives with: lives with their spouse Lives in: House/apartment Stairs: Yes: External: 1 steps; on right going up Has following equipment at home: Environmental consultant - 4 wheeled, Shower bench, Grab bars, and 3 wheeled walker  OCCUPATION: Permanent disability  PLOF: Independent with household mobility with device  PATIENT GOALS: improve shoulder pain  NEXT MD VISIT:  no follow-up scheduled  OBJECTIVE:   DIAGNOSTIC FINDINGS:  10/10/22 DG Shoulder left FINDINGS: There is no evidence of fracture or dislocation. There is no evidence of arthropathy or other focal bone abnormality. Soft tissues are unremarkable.  PATIENT SURVEYS:  Quick Dash 56.8% impairment  COGNITION: Overall cognitive status: Within functional limits for tasks assessed  OBSERVATION: Patient enters with 4WRW, bil genu valugum, knee hyperextension in stance phase, accompanied by fiance.  Decreased muscle bulk noted throughout, no apparent distress, however winces with L arm movements, and poor motor  control noted raising arm.      SENSATION: Light touch: WFL  POSTURE: Sacral sits, rounded shoulders, forward head.    UPPER EXTREMITY ROM:   Active ROM Right eval Left eval  Shoulder flexion 135 90*  Shoulder extension 90 50*  Shoulder abduction 130 90*  Shoulder adduction    Shoulder internal rotation To T6 Unable due to pain  Shoulder external rotation To Right scapula Several tries need to bring to L shoulder, limited ER *  (Blank rows = not tested) *increased pain  UPPER EXTREMITY MMT:  MMT Right eval Left eval  Shoulder flexion 5 4+*  Shoulder extension    Shoulder abduction 5 3*  Shoulder internal rotation 5 4+*  Shoulder external rotation 5 4+*  Elbow flexion 5 4+*  Elbow extension 5 5  Wrist flexion 5 5  Wrist extension 5 5  Grip strength  good good  (Blank rows = not tested)  *pain with resistance  SHOULDER SPECIAL TESTS: Impingement tests: Painful arc test: positive   JOINT MOBILITY TESTING:  NT  PALPATION:  Increased pain over L corocoid process, biceps and RTC insertion   TODAY'S TREATMENT:                                                                                                                                         DATE:   12/18/2022 Therapeutic Exercise: to improve strength and mobility.  Demo, verbal and tactile cues throughout for technique. UBE L1 x 4 min forward     12/16/2022 Therapeutic Exercise: to improve strength and mobility.  Demo, verbal and tactile cues throughout for technique. UBE L1 x 4 min forward Seated rows YTB 2 x 10 Seated bil shoulder ER  x 20 YTB Seated shoulder IR YTB x 15 YTB Arm press with cane in  supine x 20 Shoulder flexion in supine with cane x 15 - cues for slow controlled movement in pain free ROM Manual Therapy: to decrease muscle spasm and pain and improve mobility STM/TPR to L shoulder deltoid and biceps, skilled palpation and monitoring during dry needling. Trigger Point Dry-Needling  Treatment  instructions: Expect mild to moderate muscle soreness. S/S of pneumothorax if dry needled over a lung field, and to seek immediate medical attention should they occur. Patient verbalized understanding of these instructions and education. Patient Consent Given: Yes Education handout provided: Yes Muscles treated: L biceps Electrical stimulation performed: No Parameters: N/A Treatment response/outcome: Twitch Response Elicited and Palpable Increase in Muscle Length   11/20/2022 Therapeutic Exercise: to improve strength and mobility.  Demo, verbal and tactile cues throughout for technique. Shoulder rolls - throughout session  Seated table slides scaption x 20 L Seated table slides IR x 20 L Seated shoulder isometrics  L flexion, elbow extension, ER/IR 5x5" hold each,  Seated scap retraction 2 x 5 5 sec hold Putty pulls/twists - yellow Modalities: [place] x 8 min to L biceps 1 MHZ, 1.2 w/cm2 to continuous to decrease pain. Manual Therapy: to decrease muscle spasm and pain and improve mobility STM/TPR to L UT, levator scapule, cervical paraspinals and rhomboids.    PATIENT EDUCATION: Education details: review HEP, issued Yellow theraband Person educated: Patient Education method: Explanation Education comprehension: verbalized understanding  HOME EXERCISE PROGRAM: Access Code: [email or web address] URL: [email or web address] Date: 12/16/2022 Prepared by: Glenetta Hew  Exercises - Seated Shoulder Row with Anchored Resistance  - 1 x daily - 7 x weekly - 2 sets - 10 reps - Seated Shoulder Internal Rotation with Anchored Resistance  - 1 x daily - 7 x weekly - 2 sets - 10 reps - Seated Bilateral Shoulder External Rotation with Resistance  - 1 x daily - 7 x weekly - 2 sets - 10 reps - Supine Shoulder Press with Dowel  - 1 x daily - 7 x weekly - 2 sets - 10 reps - Supine Shoulder Flexion AAROM with Hands Clasped  - 1 x daily - 7 x weekly - 2 sets - 10 reps  ASSESSMENT:  CLINICAL  IMPRESSION: ***  OBJECTIVE IMPAIRMENTS: Abnormal gait, decreased activity tolerance, decreased balance, decreased coordination, decreased endurance, decreased mobility, difficulty walking, decreased ROM, decreased strength, decreased safety awareness, increased muscle spasms, impaired UE functional use, postural dysfunction, and pain.   ACTIVITY LIMITATIONS: carrying, lifting, bending, sleeping, transfers, bed mobility, dressing, reach over head, hygiene/grooming, and locomotion level  PARTICIPATION LIMITATIONS: meal prep, cleaning, laundry, and community activity  PERSONAL FACTORS: Fitness and 3+ comorbidities: Anxiety and depression, cerebellar ataxia, Raynaud disease (feet), osteoporosis, emphysema (former smoker)  are also affecting patient's functional outcome.   REHAB POTENTIAL: Good  CLINICAL DECISION MAKING: Evolving/moderate complexity  EVALUATION COMPLEXITY: Moderate  GOALS: Goals reviewed with patient? Yes  SHORT TERM GOALS: Target date: 11/25/2022   Patient will be independent with initial HEP.  Baseline: needs Goal status: IN PROGRESS 11/20/22- reports compliance, cues needed  2.  Patient will have LE strength/functional strength evaluated.   Baseline: not performed today.  Goal status: INITIAL  3.  Patient will complete TUG.  Baseline: not performed today Goal status: INITIAL   LONG TERM GOALS: Target date: 01/06/2023   Patient will be independent with advanced/ongoing HEP to improve outcomes and carryover.  Baseline:  Goal status: IN PROGRESS  2.  Patient will report 75% improvement in left shoulder pain to improve QOL.  Baseline:  Goal  status: IN PROGRESS  3.  Patient to improve Left shoulder AROM to [facility] without pain provocation to allow for increased ease of ADLs.  Baseline: see objective Goal status: IN PROGRESS  4.  Patient will report at least 10% improvement on QuickDash  to demonstrate improved functional ability.  Baseline: 56.8%  disability Goal status: IN PROGRESS  6.  Patient will be able to reach behind head and back without increased pain to perform ADLs.     Baseline: unable due to pain Goal status: IN PROGRESS   7. Patient will demonstrate improved functional LE strength with goal TBD to decrease risk of falls.   Baseline: not assessed Goal status: INITIAL  8. Patient will be able to complete TUG in less than 25 seconds with AD in order to decrease fall risk.   Baseline: NT Goal status: IN PROGRESS   PLAN:  PT FREQUENCY: 1-2x/week  PT DURATION: 8 weeks  PLANNED INTERVENTIONS: Therapeutic exercises, Therapeutic activity, Neuromuscular re-education, Balance training, Gait training, Patient/Family education, Self Care, Joint mobilization, Dry Needling, Electrical stimulation, Spinal mobilization, Cryotherapy, Moist heat, Taping, Ultrasound, Ionotophoresis '4mg'$ /ml Dexamethasone, Manual therapy, and Re-evaluation  PLAN FOR NEXT SESSION: continue to progress L shoulder strength and ROM as tolerated,  modalities/manual therapy for L shoulder PRN, will assess LE/TUG/5xSTS, mCTSIB at later date.    Braeley Buskey, PT, DPT 12/17/2022, 8:45 PM

## 2022-12-18 ENCOUNTER — Ambulatory Visit: Payer: Medicare Other | Admitting: Physical Therapy

## 2022-12-22 ENCOUNTER — Encounter: Payer: Self-pay | Admitting: Family Medicine

## 2022-12-22 NOTE — Therapy (Incomplete)
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   Osteoporosis 07/08/2021   DEXA 03/2021: lowest T = -3.3, L femur; forearm -2.2; started tx 07/2021   Raynaud disease 05/13/2018   feet   Splenic rupture 10/26/14   Past Surgical History:  Procedure Laterality Date   ABDOMINAL HYSTERECTOMY     CATARACT EXTRACTION Bilateral 01/17/2020   SPLENECTOMY, TOTAL N/A 10/26/2014   Procedure: SPLENECTOMY;  Surgeon: Georganna Skeans, MD;  Location: [facility] OR;  Service: General;  Laterality: N/A;   WRIST FRACTURE SURGERY Right    Patient Active Problem List   Diagnosis Date Noted   Osteoporosis 07/08/2021   Former smoker 07/08/2021   On statin therapy due to risk of future cardiovascular event 11/18/2019   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   Migraine without aura and without status migrainosus, not intractable 01/10/2019   Major depression, recurrent, chronic ([place]) 08/13/2018   Acquired iron deficiency anemia due to decreased absorption 06/18/2018   Raynaud disease 05/13/2018   Emphysema of lung ([place]) 05/13/2018   AR (allergic rhinitis) 01/08/2018   OAB (overactive bladder) 11/09/2014   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  Rationale for Evaluation and Treatment: Rehabilitation  ONSET DATE: October , 2023   SUBJECTIVE:                                                                                                                                                                                      SUBJECTIVE STATEMENT: ***   PERTINENT HISTORY: Steroid injection L shoulder 10/20/22 for possible partial tear L RTC  Anxiety and depression, cerebellar ataxia, Raynaud disease (feet), osteoporosis, emphysema (former smoker)  PAIN:  Are you having pain? Yes: NPRS scale: 7/10 Pain location: L shoulder  Pain description: hurt all the time, now better, just tender Aggravating factors: lifting - can't lift > 16 oz due to pain,  Relieving factors: motrin, steroid shot  PRECAUTIONS: Fall  WEIGHT BEARING RESTRICTIONS: No  FALLS:  Has patient fallen in last 6 months? Yes. Number of falls at least 15, falls almost daily  LIVING ENVIRONMENT: Lives with: lives with their spouse Lives in: House/apartment Stairs: Yes: External: 1 steps; on right going up Has following equipment at home: Environmental consultant - 4 wheeled, Shower bench, Grab bars, and 3 wheeled walker  OCCUPATION: Permanent disability  PLOF: Independent with household mobility with device  PATIENT GOALS: improve shoulder pain  NEXT MD VISIT:  no follow-up scheduled  OBJECTIVE:   DIAGNOSTIC FINDINGS:  10/10/22 DG Shoulder left FINDINGS: There is no evidence of fracture or dislocation. There is no evidence of arthropathy or other focal bone abnormality. Soft tissues are unremarkable.  PATIENT SURVEYS:  Quick Dash 56.8% impairment  COGNITION: Overall cognitive status: Within functional limits for tasks assessed  OBSERVATION: Patient enters with 4WRW, bil genu valugum, knee hyperextension in stance phase, accompanied by fiance.  Decreased muscle bulk noted throughout, no apparent distress, however winces with L arm movements, and poor motor  control noted raising arm.      SENSATION: Light touch: WFL  POSTURE: Sacral sits, rounded shoulders, forward head.    UPPER EXTREMITY ROM:   Active ROM Right eval Left eval  Shoulder flexion 135 90*  Shoulder extension 90 50*  Shoulder abduction 130 90*  Shoulder adduction    Shoulder internal rotation To T6 Unable due to pain  Shoulder external rotation To Right scapula Several tries need to bring to L shoulder, limited ER *  (Blank rows = not tested) *increased pain  UPPER EXTREMITY MMT:  MMT Right eval Left eval  Shoulder flexion 5 4+*  Shoulder extension    Shoulder abduction 5 3*  Shoulder internal rotation 5 4+*  Shoulder external rotation 5 4+*  Elbow flexion 5 4+*  Elbow extension 5 5  Wrist flexion 5 5  Wrist extension 5 5  Grip strength  good good  (Blank rows = not tested)  *pain with resistance  SHOULDER SPECIAL TESTS: Impingement tests: Painful arc test: positive   JOINT MOBILITY TESTING:  NT  PALPATION:  Increased pain over L corocoid process, biceps and RTC insertion   TODAY'S TREATMENT:                                                                                                                                         DATE:   12/23/2022 Therapeutic Exercise: to improve strength and mobility.  Demo, verbal and tactile cues throughout for technique. UBE L1 x 4 min forward     12/16/2022 Therapeutic Exercise: to improve strength and mobility.  Demo, verbal and tactile cues throughout for technique. UBE L1 x 4 min forward Seated rows YTB 2 x 10 Seated bil shoulder ER  x 20 YTB Seated shoulder IR YTB x 15 YTB Arm press with cane in  supine x 20 Shoulder flexion in supine with cane x 15 - cues for slow controlled movement in pain free ROM Manual Therapy: to decrease muscle spasm and pain and improve mobility STM/TPR to L shoulder deltoid and biceps, skilled palpation and monitoring during dry needling. Trigger Point Dry-Needling  Treatment  instructions: Expect mild to moderate muscle soreness. S/S of pneumothorax if dry needled over a lung field, and to seek immediate medical attention should they occur. Patient verbalized understanding of these instructions and education. Patient Consent Given: Yes Education handout provided: Yes Muscles treated: L biceps Electrical stimulation performed: No Parameters: N/A Treatment response/outcome: Twitch Response Elicited and Palpable Increase in Muscle Length   11/20/2022 Therapeutic Exercise: to improve strength and mobility.  Demo, verbal and tactile cues throughout for technique. Shoulder rolls - throughout session  Seated table slides scaption x 20 L Seated table slides IR x 20 L Seated shoulder isometrics  L flexion, elbow extension, ER/IR 5x5" hold each,  Seated scap retraction 2 x 5 5 sec hold Putty pulls/twists - yellow Modalities: [place] x 8 min to L biceps 1 MHZ, 1.2 w/cm2 to continuous to decrease pain. Manual Therapy: to decrease muscle spasm and pain and improve mobility STM/TPR to L UT, levator scapule, cervical paraspinals and rhomboids.    PATIENT EDUCATION: Education details: review HEP, issued Yellow theraband Person educated: Patient Education method: Explanation Education comprehension: verbalized understanding  HOME EXERCISE PROGRAM: Access Code: [email or web address] URL: [email or web address] Date: 12/16/2022 Prepared by: Glenetta Hew  Exercises - Seated Shoulder Row with Anchored Resistance  - 1 x daily - 7 x weekly - 2 sets - 10 reps - Seated Shoulder Internal Rotation with Anchored Resistance  - 1 x daily - 7 x weekly - 2 sets - 10 reps - Seated Bilateral Shoulder External Rotation with Resistance  - 1 x daily - 7 x weekly - 2 sets - 10 reps - Supine Shoulder Press with Dowel  - 1 x daily - 7 x weekly - 2 sets - 10 reps - Supine Shoulder Flexion AAROM with Hands Clasped  - 1 x daily - 7 x weekly - 2 sets - 10 reps  ASSESSMENT:  CLINICAL  IMPRESSION: ***  OBJECTIVE IMPAIRMENTS: Abnormal gait, decreased activity tolerance, decreased balance, decreased coordination, decreased endurance, decreased mobility, difficulty walking, decreased ROM, decreased strength, decreased safety awareness, increased muscle spasms, impaired UE functional use, postural dysfunction, and pain.   ACTIVITY LIMITATIONS: carrying, lifting, bending, sleeping, transfers, bed mobility, dressing, reach over head, hygiene/grooming, and locomotion level  PARTICIPATION LIMITATIONS: meal prep, cleaning, laundry, and community activity  PERSONAL FACTORS: Fitness and 3+ comorbidities: Anxiety and depression, cerebellar ataxia, Raynaud disease (feet), osteoporosis, emphysema (former smoker)  are also affecting patient's functional outcome.   REHAB POTENTIAL: Good  CLINICAL DECISION MAKING: Evolving/moderate complexity  EVALUATION COMPLEXITY: Moderate  GOALS: Goals reviewed with patient? Yes  SHORT TERM GOALS: Target date: 11/25/2022   Patient will be independent with initial HEP.  Baseline: needs Goal status: IN PROGRESS 11/20/22- reports compliance, cues needed  2.  Patient will have LE strength/functional strength evaluated.   Baseline: not performed today.  Goal status: INITIAL  3.  Patient will complete TUG.  Baseline: not performed today Goal status: INITIAL   LONG TERM GOALS: Target date: 01/06/2023   Patient will be independent with advanced/ongoing HEP to improve outcomes and carryover.  Baseline:  Goal status: IN PROGRESS  2.  Patient will report 75% improvement in left shoulder pain to improve QOL.  Baseline:  Goal  status: IN PROGRESS  3.  Patient to improve Left shoulder AROM to [facility] without pain provocation to allow for increased ease of ADLs.  Baseline: see objective Goal status: IN PROGRESS  4.  Patient will report at least 10% improvement on QuickDash  to demonstrate improved functional ability.  Baseline: 56.8%  disability Goal status: IN PROGRESS  6.  Patient will be able to reach behind head and back without increased pain to perform ADLs.     Baseline: unable due to pain Goal status: IN PROGRESS   7. Patient will demonstrate improved functional LE strength with goal TBD to decrease risk of falls.   Baseline: not assessed Goal status: INITIAL  8. Patient will be able to complete TUG in less than 25 seconds with AD in order to decrease fall risk.   Baseline: NT Goal status: IN PROGRESS   PLAN:  PT FREQUENCY: 1-2x/week  PT DURATION: 8 weeks  PLANNED INTERVENTIONS: Therapeutic exercises, Therapeutic activity, Neuromuscular re-education, Balance training, Gait training, Patient/Family education, Self Care, Joint mobilization, Dry Needling, Electrical stimulation, Spinal mobilization, Cryotherapy, Moist heat, Taping, Ultrasound, Ionotophoresis '4mg'$ /ml Dexamethasone, Manual therapy, and Re-evaluation  PLAN FOR NEXT SESSION: continue to progress L shoulder strength and ROM as tolerated,  modalities/manual therapy for L shoulder PRN, will assess LE/TUG/5xSTS, mCTSIB at later date.    Jesilyn Easom, PT 12/17/2022, 8:45 PM

## 2022-12-23 ENCOUNTER — Ambulatory Visit (INDEPENDENT_AMBULATORY_CARE_PROVIDER_SITE_OTHER): Payer: Medicare Other | Admitting: Family Medicine

## 2022-12-23 ENCOUNTER — Encounter: Payer: Self-pay | Admitting: Family Medicine

## 2022-12-23 ENCOUNTER — Ambulatory Visit: Payer: Medicare Other | Admitting: Physical Therapy

## 2022-12-23 VITALS — BP 146/84 | HR 104 | Temp 97.8°F | Ht 68.0 in | Wt 119.6 lb

## 2022-12-23 DIAGNOSIS — R051 Acute cough: Secondary | ICD-10-CM | POA: Diagnosis not present

## 2022-12-23 DIAGNOSIS — J431 Panlobular emphysema: Secondary | ICD-10-CM

## 2022-12-23 DIAGNOSIS — J441 Chronic obstructive pulmonary disease with (acute) exacerbation: Secondary | ICD-10-CM | POA: Diagnosis not present

## 2022-12-23 LAB — POC COVID19 BINAXNOW: SARS Coronavirus 2 Ag: NEGATIVE

## 2022-12-23 MED ORDER — PREDNISONE 20 MG PO TABS: ORAL_TABLET | ORAL | 0 refills | Status: DC

## 2022-12-23 MED ORDER — GUAIFENESIN-CODEINE 100-10 MG/5ML PO SOLN: 5.0000 mL | Freq: Four times a day (QID) | ORAL | 0 refills | Status: DC | PRN

## 2022-12-23 MED ORDER — AZITHROMYCIN 250 MG PO TABS: ORAL_TABLET | ORAL | 0 refills | Status: DC

## 2022-12-23 NOTE — Progress Notes (Signed)
Subjective  CC:  Chief Complaint  Patient presents with   Cough    Pt  stated  that he has been coughing for about 2 weeks now. It has not gotten any worse    HPI: Sherry Zimmerman is a 67 y.o. female who presents to the office today to address the problems listed above in the chief complaint. Approx 7-10 days of bronchitis sxs: productive hacking cough, associated with DOE and wheezing responsive to albuterol. Has emphysema on breztri and prn albuterol. No pleuritic cp, high fevers or dyspnea at rest. Eating ok. Husband with same although he has started to improve.     Assessment  1. COPD with acute exacerbation ([place])   2. Acute cough   3. Panlobular emphysema ([place])      Plan  Copd with exacerbation/bronchitis:  education given. No respiratory distress. Zpak, pred burst trial and cough meds. Continue use of inhalers and monitor for improvement.   Follow up: prn  Visit date not found  Orders Placed This Encounter  Procedures   POC COVID-19   Meds ordered this encounter  Medications   azithromycin (ZITHROMAX) 250 MG tablet    Sig: Take 2 tabs today, then 1 tab daily for 4 days    Dispense:  1 each    Refill:  0   predniSONE (DELTASONE) 20 MG tablet    Sig: Take 3 tabs daily for 5 days    Dispense:  15 tablet    Refill:  0   guaiFENesin-codeine 100-10 MG/5ML syrup    Sig: Take 5 mLs by mouth every 6 (six) hours as needed for cough.    Dispense:  240 mL    Refill:  0      I reviewed the patients updated PMH, FH, and SocHx.    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    Priority: High   Migraine  without aura and without status migrainosus, not intractable 01/10/2019    Priority: Medium    Acquired iron deficiency anemia due to decreased absorption 06/18/2018    Priority: Medium    OAB (overactive bladder) 11/09/2014    Priority: Medium    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 Take with a full glass of water on an empty stomach. Sit upright for 30 minutes   atorvastatin (LIPITOR) 10 MG tablet Take 1 tablet (10 mg total) by mouth at bedtime.   azithromycin (ZITHROMAX) 250 MG tablet Take 2 tabs today, then 1 tab daily for 4 days   Glycopyrrolate-Formoterol (BEVESPI AEROSPHERE) 9-4.8 MCG/ACT AERO Inhale 2 puffs into the  lungs 2 (two) times daily.   guaiFENesin-codeine 100-10 MG/5ML syrup Take 5 mLs by mouth every 6 (six) hours as needed for cough.   oxybutynin (DITROPAN) 5 MG tablet Take 1 tablet (5 mg total) by mouth 2 (two) times daily.   predniSONE (DELTASONE) 20 MG tablet Take 3 tabs daily for 5 days   rizatriptan (MAXALT) 10 MG tablet Take 10 mg by mouth as needed for migraine. May repeat in 2 hours if needed   valACYclovir (VALTREX) 1000 MG tablet Take 2 tablets (2,000 mg total) by mouth 2 (two) times daily. Once, as needed for cold sores   venlafaxine XR (EFFEXOR-XR) 150 MG 24 hr capsule TAKE 1 CAPSULE BY MOUTH AT BEDTIME **TAKE  WITH  '75MG'$   CAPSULES  FOR  '225MG'$   TOTAL  DAILY  DOSE   venlafaxine XR (EFFEXOR-XR) 75 MG 24 hr capsule TAKE 1 CAPSULE BY MOUTH ONCE DAILY **TAKE  WITH  '150MG'$   CAPSULES  FOR  '225MG'$   TOTAL  DAILY  DOSE     Allergies: Patient is allergic to morphine and related, sulfa antibiotics, and sulfacetamide sodium. Family History: Patient family history includes Ataxia in her mother; Cancer in her father; Diabetes in her mother; Heart disease in her mother; Stroke in her mother. Social History:  Patient  reports that she has quit smoking. Her smoking use included cigarettes. She has a 50.00 pack-year smoking history. She has never used smokeless tobacco. She reports current alcohol use. She reports that she does not use drugs.  Review of Systems: Constitutional: Negative for fever malaise or anorexia Cardiovascular: negative for chest pain Respiratory: negative for SOB or persistent cough Gastrointestinal: negative for abdominal pain  Objective  Vitals: BP (!) 146/84   Pulse (!) 104   Temp 97.8 F (36.6 C)   Ht '5\' 8"'$  (1.727 m)   Wt 119 lb 9.6 oz (54.3 kg)   SpO2 92%   BMI 18.19 kg/m  General: no acute distress , A&Ox3, no respiratory distress but states she got winded from walking to exam room from waiting room HEENT: PEERL, conjunctiva normal, neck is supple Cardiovascular:  RRR without murmur or gallop.  Respiratory:  distant breath sounds bilaterally, CTAB without rales or wheezing Skin:  Warm, no rashes  Negative covid in office today [facility] side effects, risks, benefits, and alternatives for medications and treatment plan prescribed today were discussed, and the patient expressed understanding of the given instructions. Patient is instructed to call or message via MyChart if he/she has any questions or concerns regarding our treatment plan. No barriers to understanding were identified. We discussed Red Flag symptoms and signs in detail. Patient expressed understanding regarding what to do in case of urgent or emergency type symptoms.  Medication list was reconciled, printed and provided to the patient in AVS. Patient instructions and summary information was reviewed with the patient as  documented in the AVS. This note was prepared with assistance of Dragon voice recognition software. Occasional wrong-word or sound-a-like substitutions may have occurred due to the inherent limitations of voice recognition software

## 2022-12-23 NOTE — Patient Instructions (Signed)
Please follow up if symptoms do not improve or as needed.   

## 2022-12-25 ENCOUNTER — Ambulatory Visit: Payer: Medicare Other | Admitting: Physical Therapy

## 2022-12-30 ENCOUNTER — Ambulatory Visit: Payer: Medicare Other | Admitting: Physical Therapy

## 2023-01-01 ENCOUNTER — Ambulatory Visit: Payer: Medicare Other

## 2023-01-05 NOTE — Therapy (Signed)
OUTPATIENT PHYSICAL THERAPY TREATMENT   Patient Name: Sherry Zimmerman MRN: [id number] DOB:Feb 23, 1956, 67 y.o., female Today's Date: 01/06/2023  END OF SESSION:  PT End of Session - 01/06/23 1253     Visit Number 5    Number of Visits 12    Date for PT Re-Evaluation 02/18/23    Authorization Type [employer] Medicare    Progress Note Due on Visit 15    PT Start Time 1300    PT Stop Time 1348    PT Time Calculation (min) 48 min    Activity Tolerance Patient tolerated treatment well    Behavior During Therapy WFL for tasks assessed/performed               Past Medical History:  Diagnosis Date   Anxiety    AR (allergic rhinitis)    Cerebellar ataxia ([place]) 2010   Depression    Iron deficiency anemia due to chronic blood loss 06/18/2018   Major depression, recurrent, chronic ([place]) 08/13/2018   Managed with effexor, chronically   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   Osteoporosis 07/08/2021   DEXA 03/2021: lowest T = -3.3, L femur; forearm -2.2; started tx 07/2021   Raynaud disease 05/13/2018   feet   Splenic rupture 10/26/14   Past Surgical History:  Procedure Laterality Date   ABDOMINAL HYSTERECTOMY     CATARACT EXTRACTION Bilateral 01/17/2020   SPLENECTOMY, TOTAL N/A 10/26/2014   Procedure: SPLENECTOMY;  Surgeon: Georganna Skeans, MD;  [facility] OR;  Service: General;  Laterality: N/A;   WRIST FRACTURE SURGERY Right    Patient Active Problem List   Diagnosis Date Noted   Osteoporosis 07/08/2021   Former smoker 07/08/2021   On statin therapy due to risk of future cardiovascular event 11/18/2019   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   Migraine without aura and without status migrainosus, not intractable 01/10/2019   Major depression, recurrent, chronic ([place]) 08/13/2018   Acquired iron deficiency anemia due to decreased absorption 06/18/2018   Raynaud  disease 05/13/2018   Emphysema of lung ([place]) 05/13/2018   AR (allergic rhinitis) 01/08/2018   OAB (overactive bladder) 11/09/2014   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 She has missed PT due to bronchitis. Still experiencing frequent falls. Pain in shoulder is mainly with lifting with left and lying on it. She is semi compliant with HEP.  Left hand dominant.   PERTINENT HISTORY: Steroid injection L shoulder 10/20/22 for possible partial tear L RTC  Anxiety and depression, cerebellar ataxia, Raynaud disease (feet), osteoporosis, emphysema (former smoker)  PAIN:  Are you having pain? Yes: NPRS scale: 4/10 Pain location: L shoulder  Pain description: hurt all the time, now better, just tender Aggravating factors: lifting - can't lift > 16 oz due to pain,  Relieving factors: motrin, steroid shot  PRECAUTIONS: Fall  WEIGHT BEARING RESTRICTIONS: No  FALLS:  Has patient fallen in last 6 months? Yes. Number of falls at least 15, falls almost daily  LIVING ENVIRONMENT: Lives with: lives with their spouse Lives in: House/apartment Stairs: Yes: External: 1 steps; on right going up Has following equipment at home: Environmental consultant - 4 wheeled, Shower bench, Grab bars, and 3 wheeled walker  OCCUPATION: Permanent  disability  PLOF: Independent with household mobility with device  PATIENT GOALS: improve shoulder pain  NEXT MD VISIT:  no follow-up scheduled  OBJECTIVE:   DIAGNOSTIC FINDINGS:  10/10/22 DG Shoulder left FINDINGS: There is no evidence of fracture or dislocation. There is no evidence of arthropathy or other focal bone abnormality. Soft tissues are unremarkable.  PATIENT SURVEYS:  Quick Dash 56.8% impairment  COGNITION: Overall cognitive status: Within functional limits for tasks assessed  OBSERVATION: Patient enters with 4WRW, bil genu valugum, knee hyperextension in stance phase, accompanied by fiance.  Decreased muscle bulk noted throughout, no apparent distress, however winces with L arm movements, and poor motor control noted raising arm.      SENSATION: Light touch: WFL  POSTURE: Sacral sits, rounded shoulders, forward head.    UPPER EXTREMITY ROM:   Active ROM Right eval Left eval Left A/PROM 01/06/23  Shoulder flexion 135 90* 110/120 *  Shoulder extension 90 50* NT*  Shoulder abduction 130 90* 94/119*  Shoulder adduction     Shoulder internal rotation To T6 Unable due to pain Functionally thumb to PSIS 38/52*  Shoulder external rotation To Right scapula Several tries need to bring to L shoulder, limited ER * Able to reach behind head 68/68*  (Blank rows = not tested) *increased pain  UPPER EXTREMITY MMT:  MMT Right eval Left eval Left 01/06/23  Shoulder flexion 5 4+* 4+*  Shoulder extension   Releases due to pain  Shoulder abduction 5 3* 5*  Shoulder internal rotation 5 4+* 4+  Shoulder external rotation 5 4+* 4+*  Elbow flexion 5 4+* 4+  Elbow extension 5 5   Wrist flexion 5 5   Wrist extension 5 5   Grip strength  good good   (Blank rows = not tested)  *pain with resistance  SHOULDER SPECIAL TESTS: Impingement tests: Painful arc test: positive   JOINT MOBILITY TESTING:  NT  PALPATION:  Increased pain over L corocoid process, biceps and RTC  insertion   TODAY'S TREATMENT:  DATE:   01/06/2023 Therapeutic Exercise: to improve strength and mobility.  Demo, verbal and tactile cues throughout for technique. UBE L1 x 4 min forward ROM with TB flex and abd x 10 ea seated ROM flex with cane x 5 Seated rows YTB 2 x 10 Seated ext YTB, 2x 10 Seated horizontal ABD YTB 2x10 Seated bil shoulder ER  x 20 YTB Seated shoulder IR YTB x 20 YTB SDLY ER x 15 Supine Serratus Push x 20 L  Strength and ROM reassessed   12/16/2022 Therapeutic Exercise: to improve strength and mobility.  Demo, verbal and tactile cues throughout for technique. UBE L1 x 4 min forward Seated rows YTB 2 x 10 Seated bil shoulder ER  x 20 YTB Seated shoulder IR YTB x 15 YTB Arm press with cane in supine x 20 Shoulder flexion in supine with cane x 15 - cues for slow controlled movement in pain free ROM Manual Therapy: to decrease muscle spasm and pain and improve mobility STM/TPR to L shoulder deltoid and biceps, skilled palpation and monitoring during dry needling. Trigger Point Dry-Needling  Treatment instructions: Expect mild to moderate muscle soreness. S/S of pneumothorax if dry needled over a lung field, and to seek immediate medical attention should they occur. Patient verbalized understanding of these instructions and education. Patient Consent Given: Yes Education handout provided: Yes Muscles treated: L biceps Electrical stimulation performed: No Parameters: N/A Treatment response/outcome: Twitch Response Elicited and Palpable Increase in Muscle Length   11/20/2022 Therapeutic Exercise: to improve strength and mobility.  Demo, verbal and tactile cues throughout for technique. Shoulder rolls - throughout session  Seated table slides scaption x 20 L Seated table slides IR x 20 L Seated shoulder isometrics  L flexion, elbow  extension, ER/IR 5x5" hold each,  Seated scap retraction 2 x 5 5 sec hold Putty pulls/twists - yellow Modalities: [place] x 8 min to L biceps 1 MHZ, 1.2 w/cm2 to continuous to decrease pain. Manual Therapy: to decrease muscle spasm and pain and improve mobility STM/TPR to L UT, levator scapule, cervical paraspinals and rhomboids.    PATIENT EDUCATION: Education details: update HEP, issued Yellow theraband Person educated: Patient Education method: Explanation Education comprehension: verbalized understanding  HOME EXERCISE PROGRAM: Access Code: [email or web address] URL: [email or web address] Date: 01/06/2023 Prepared by: Almyra Free  Exercises - Seated Shoulder Rolls  - 1 x daily - 7 x weekly - 2 sets - 10 reps - Seated Isometric Shoulder Internal Rotation with Towel  - 1 x daily - 7 x weekly - 2 sets - 5 reps - 5-10 sec hold - Seated Isometric Shoulder External Rotation  - 1 x daily - 7 x weekly - 2 sets - 5 reps - 5-10 sec hold - Seated Isometric Shoulder Flexion  - 1 x daily - 7 x weekly - 2 sets - 5 reps - 5-10 sec hold - Seated Isometric Elbow Extension  - 1 x daily - 7 x weekly - 2 sets - 5 reps - 5 - 10 sec  hold - Seated Scapular Retraction  - 1 x daily - 7 x weekly - 2 sets - 5 reps - 5-10 sec  hold - Putty Squeezes  - 1 x daily - 7 x weekly - 3 sets - 10 reps - Seated Shoulder Row with Anchored Resistance  - 1 x daily - 7 x weekly - 2 sets - 10 reps - Seated Shoulder Internal Rotation with Anchored Resistance  - 1 x daily - 7 x weekly - 2  sets - 10 reps - Seated Bilateral Shoulder External Rotation with Resistance  - 1 x daily - 7 x weekly - 2 sets - 10 reps - Supine Shoulder Press with Dowel  - 1 x daily - 7 x weekly - 2 sets - 10 reps - Supine Shoulder Flexion AAROM with Hands Clasped  - 1 x daily - 7 x weekly - 2 sets - 10 reps - Seated Shoulder Extension and Scapular Retraction with Resistance  - 1 x daily - 7 x weekly - 2 sets - 10 reps - Seated Shoulder Horizontal Abduction  with Resistance - Palms Down  - 1 x daily - 7 x weekly - 2 sets - 10 reps  ASSESSMENT:  CLINICAL IMPRESSION: Yanette Tripoli returns to PT following a 3 week absence due to illness. She demonstrates ongoing limitations in L shoulder ROM and strength and pain with end range shoulder movements and with resistance during MMT. She has made some improvements with functional ROM and is now able to reach behind her head and behind her back (though not fully). She also has increased tone and trigger points in her L UT, IS and lateral scapular muscles. She would benefit from additional DN in these areas next session. [place] continues to demonstrate potential for improvement and would benefit from continued skilled therapy to address impairments. I recommend 2x/wk for 6 wks due to lapse in original POC.   OBJECTIVE IMPAIRMENTS: Abnormal gait, decreased activity tolerance, decreased balance, decreased coordination, decreased endurance, decreased mobility, difficulty walking, decreased ROM, decreased strength, decreased safety awareness, increased muscle spasms, impaired UE functional use, postural dysfunction, and pain.   ACTIVITY LIMITATIONS: carrying, lifting, bending, sleeping, transfers, bed mobility, dressing, reach over head, hygiene/grooming, and locomotion level  PARTICIPATION LIMITATIONS: meal prep, cleaning, laundry, and community activity  PERSONAL FACTORS: Fitness and 3+ comorbidities: Anxiety and depression, cerebellar ataxia, Raynaud disease (feet), osteoporosis, emphysema (former smoker)  are also affecting patient's functional outcome.   REHAB POTENTIAL: Good  CLINICAL DECISION MAKING: Evolving/moderate complexity  EVALUATION COMPLEXITY: Moderate  GOALS: Goals reviewed with patient? Yes  SHORT TERM GOALS: Target date: 11/25/2022   Patient will be independent with initial HEP.  Baseline: needs Goal status: IN PROGRESS 11/20/22- reports compliance, cues needed  2.  Patient will have  LE strength/functional strength evaluated.   Baseline: not performed today.  Goal status: INITIAL  3.  Patient will complete TUG.  Baseline: not performed today Goal status: INITIAL   LONG TERM GOALS: Target date: 01/06/2023 extended to   Patient will be independent with advanced/ongoing HEP to improve outcomes and carryover.  Baseline:  Goal status: IN PROGRESS  2.  Patient will report 75% improvement in left shoulder pain to improve QOL.  Baseline:  Goal status: IN PROGRESS  3.  Patient to improve Left shoulder AROM to [facility] without pain provocation to allow for increased ease of ADLs.  Baseline: see objective Goal status: IN PROGRESS  4.  Patient will report at least 10% improvement on QuickDash  to demonstrate improved functional ability.  Baseline: 56.8% disability Goal status: IN PROGRESS  6.  Patient will be able to reach behind head and back without increased pain to perform ADLs.     Baseline: unable due to pain Goal status: IN PROGRESS   7. Patient will demonstrate improved functional LE strength with goal TBD to decrease risk of falls.   Baseline: not assessed Goal status: INITIAL  8. Patient will be able to complete TUG in less than 25 seconds  with AD in order to decrease fall risk.   Baseline: NT Goal status: IN PROGRESS   PLAN:  PT FREQUENCY: 1-2x/week  PT DURATION: 8 weeks  PLANNED INTERVENTIONS: Therapeutic exercises, Therapeutic activity, Neuromuscular re-education, Balance training, Gait training, Patient/Family education, Self Care, Joint mobilization, Dry Needling, Electrical stimulation, Spinal mobilization, Cryotherapy, Moist heat, Taping, Ultrasound, Ionotophoresis '4mg'$ /ml Dexamethasone, Manual therapy, and Re-evaluation  PLAN FOR NEXT SESSION: continue to progress L shoulder strength and ROM as tolerated,  modalities/manual therapy for L shoulder PRN, will assess LE/TUG/5xSTS, mCTSIB at later date.    Aryanah Enslow, PT 01/06/2023, 3:00 PM

## 2023-01-06 ENCOUNTER — Encounter: Payer: Self-pay | Admitting: Physical Therapy

## 2023-01-06 ENCOUNTER — Ambulatory Visit: Payer: Medicare Other | Admitting: Physical Therapy

## 2023-01-06 DIAGNOSIS — R296 Repeated falls: Secondary | ICD-10-CM

## 2023-01-06 DIAGNOSIS — M6281 Muscle weakness (generalized): Secondary | ICD-10-CM

## 2023-01-06 DIAGNOSIS — M25612 Stiffness of left shoulder, not elsewhere classified: Secondary | ICD-10-CM | POA: Diagnosis not present

## 2023-01-06 DIAGNOSIS — M25512 Pain in left shoulder: Secondary | ICD-10-CM

## 2023-01-13 ENCOUNTER — Ambulatory Visit: Payer: Medicare Other | Attending: Family Medicine

## 2023-01-13 DIAGNOSIS — M25612 Stiffness of left shoulder, not elsewhere classified: Secondary | ICD-10-CM

## 2023-01-13 DIAGNOSIS — R296 Repeated falls: Secondary | ICD-10-CM

## 2023-01-13 DIAGNOSIS — M6281 Muscle weakness (generalized): Secondary | ICD-10-CM

## 2023-01-13 DIAGNOSIS — M25512 Pain in left shoulder: Secondary | ICD-10-CM

## 2023-01-13 NOTE — Therapy (Signed)
OUTPATIENT PHYSICAL THERAPY TREATMENT   Patient Name: Sherry Zimmerman MRN: [id number] DOB:01/11/1956, 67 y.o., female Today's Date: 01/13/2023  END OF SESSION:  PT End of Session - 01/13/23 1619     Visit Number 6    Number of Visits 12    Date for PT Re-Evaluation 02/18/23    Authorization Type [employer]    Progress Note Due on Visit 15    PT Start Time 1532    PT Stop Time 1612    PT Time Calculation (min) 40 min    Activity Tolerance Patient tolerated treatment well    Behavior During Therapy WFL for tasks assessed/performed                Past Medical History:  Diagnosis Date   Anxiety    AR (allergic rhinitis)    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   Osteoporosis 07/08/2021   DEXA 03/2021: lowest T = -3.3, L femur; forearm -2.2; started tx 07/2021   Raynaud disease 05/13/2018   feet   Splenic rupture 10/26/14   Past Surgical History:  Procedure Laterality Date   ABDOMINAL HYSTERECTOMY     CATARACT EXTRACTION Bilateral 01/17/2020   SPLENECTOMY, TOTAL N/A 10/26/2014   Procedure: SPLENECTOMY;  Surgeon: Georganna Skeans, MD;  Location: [facility] OR;  Service: General;  Laterality: N/A;   WRIST FRACTURE SURGERY Right    Patient Active Problem List   Diagnosis Date Noted   Osteoporosis 07/08/2021   Former smoker 07/08/2021   On statin therapy due to risk of future cardiovascular event 11/18/2019   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   Migraine without aura and without status migrainosus, not intractable 01/10/2019   Major depression, recurrent, chronic ([place]) 08/13/2018   Acquired iron deficiency anemia due to decreased absorption 06/18/2018   Raynaud  disease 05/13/2018   Emphysema of lung ([place]) 05/13/2018   AR (allergic rhinitis) 01/08/2018   OAB (overactive bladder) 11/09/2014   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 No changes with L shoulder today.   Left hand dominant.   PERTINENT HISTORY: Steroid injection L shoulder 10/20/22 for possible partial tear L RTC  Anxiety and depression, cerebellar ataxia, Raynaud disease (feet), osteoporosis, emphysema (former smoker)  PAIN:  Are you having pain? Yes: NPRS scale: 4/10 Pain location: L shoulder  Pain description: hurt all the time, now better, just tender Aggravating factors: lifting - can't lift > 16 oz due to pain,  Relieving factors: motrin, steroid shot  PRECAUTIONS: Fall  WEIGHT BEARING RESTRICTIONS: No  FALLS:  Has patient fallen in last 6 months? Yes. Number of falls at least 15, falls almost daily  LIVING ENVIRONMENT: Lives with: lives with their spouse Lives in: House/apartment Stairs: Yes: External: 1 steps; on right going up Has following equipment at home: Environmental consultant - 4 wheeled, Shower bench, Grab bars, and 3 wheeled walker  OCCUPATION: Permanent disability  PLOF: Independent with household mobility with device  PATIENT GOALS: improve shoulder pain  NEXT MD VISIT:  no follow-up  scheduled  OBJECTIVE:   DIAGNOSTIC FINDINGS:  10/10/22 DG Shoulder left FINDINGS: There is no evidence of fracture or dislocation. There is no evidence of arthropathy or other focal bone abnormality. Soft tissues are unremarkable.  PATIENT SURVEYS:  Quick Dash 56.8% impairment  COGNITION: Overall cognitive status: Within functional limits for tasks assessed  OBSERVATION: Patient enters with 4WRW, bil genu valugum, knee hyperextension in stance phase, accompanied by fiance.  Decreased muscle bulk noted throughout, no apparent distress, however winces with L arm movements, and poor motor control noted raising arm.      SENSATION: Light touch: WFL  POSTURE: Sacral sits, rounded shoulders, forward head.    UPPER EXTREMITY ROM:   Active ROM Right eval Left eval Left A/PROM 01/06/23  Shoulder flexion 135 90* 110/120 *  Shoulder extension 90 50* NT*  Shoulder abduction 130 90* 94/119*  Shoulder adduction     Shoulder internal rotation To T6 Unable due to pain Functionally thumb to PSIS 38/52*  Shoulder external rotation To Right scapula Several tries need to bring to L shoulder, limited ER * Able to reach behind head 68/68*  (Blank rows = not tested) *increased pain  UPPER EXTREMITY MMT:  MMT Right eval Left eval Left 01/06/23  Shoulder flexion 5 4+* 4+*  Shoulder extension   Releases due to pain  Shoulder abduction 5 3* 5*  Shoulder internal rotation 5 4+* 4+  Shoulder external rotation 5 4+* 4+*  Elbow flexion 5 4+* 4+  Elbow extension 5 5   Wrist flexion 5 5   Wrist extension 5 5   Grip strength  good good   (Blank rows = not tested)  *pain with resistance  SHOULDER SPECIAL TESTS: Impingement tests: Painful arc test: positive   JOINT MOBILITY TESTING:  NT  PALPATION:  Increased pain over L corocoid process, biceps and RTC insertion   TODAY'S TREATMENT:  DATE:  01/13/23 Therapeutic Exercise: to improve strength and mobility.  Demo, verbal and tactile cues throughout for technique. Nustep [id number] Supine shoulder flexion with cane x 10  Supine chest press with cane x 10  Supine horizontal ABD RTB x 10  Supine Alt diagonals RTB x 10 S/L L shoulder abduction x 10  S/L L shoulder ER x 10 towel at side  01/06/2023 Therapeutic Exercise: to improve strength and mobility.  Demo, verbal and tactile cues throughout for technique. UBE L1 x 4 min forward ROM with TB flex and abd x 10 ea seated ROM flex with cane x 5 Seated rows YTB 2 x 10 Seated ext YTB, 2x 10 Seated horizontal ABD YTB 2x10 Seated bil shoulder ER  x 20 YTB Seated shoulder IR YTB x 20 YTB SDLY ER x 15 Supine Serratus Push x 20 L  Strength and ROM reassessed   12/16/2022 Therapeutic Exercise: to improve strength and mobility.  Demo, verbal and tactile cues throughout for technique. UBE L1 x 4 min forward Seated rows YTB 2 x 10 Seated bil shoulder ER  x 20 YTB Seated shoulder IR YTB x 15 YTB Arm press with cane in supine x 20 Shoulder flexion in supine with cane x 15 - cues for slow controlled movement in pain free ROM Manual Therapy: to decrease muscle spasm and pain and improve mobility STM/TPR to L shoulder deltoid and biceps, skilled palpation and monitoring during dry needling. Trigger Point Dry-Needling  Treatment instructions: Expect mild to moderate muscle soreness. S/S of pneumothorax if dry needled over a lung field, and to seek immediate medical attention should they occur. Patient verbalized understanding of these instructions and education. Patient Consent Given: Yes Education handout provided: Yes Muscles treated: L biceps Electrical stimulation performed: No Parameters: N/A Treatment response/outcome: Twitch Response Elicited and Palpable Increase in Muscle Length   11/20/2022 Therapeutic Exercise: to improve strength and mobility.   Demo, verbal and tactile cues throughout for technique. Shoulder rolls - throughout session  Seated table slides scaption x 20 L Seated table slides IR x 20 L Seated shoulder isometrics  L flexion, elbow extension, ER/IR 5x5" hold each,  Seated scap retraction 2 x 5 5 sec hold Putty pulls/twists - yellow Modalities: UKoreax 8 min to L biceps 1 MHZ, 1.2 w/cm2 to continuous to decrease pain. Manual Therapy: to decrease muscle spasm and pain and improve mobility STM/TPR to L UT, levator scapule, cervical paraspinals and rhomboids.    PATIENT EDUCATION: Education details: update HEP, issued Yellow theraband Person educated: Patient Education method: Explanation Education comprehension: verbalized understanding  HOME EXERCISE PROGRAM: Access Code: [email or web address]: [email or web address] Date: 01/06/2023 Prepared by: JAlmyra Free Exercises - Seated Shoulder Rolls  - 1 x daily - 7 x weekly - 2 sets - 10 reps - Seated Isometric Shoulder Internal Rotation with Towel  - 1 x daily - 7 x weekly - 2 sets - 5 reps - 5-10 sec hold - Seated Isometric Shoulder External Rotation  - 1 x daily - 7 x weekly - 2 sets - 5 reps - 5-10 sec hold - Seated Isometric Shoulder Flexion  - 1 x daily - 7 x weekly - 2 sets - 5 reps - 5-10 sec hold - Seated Isometric Elbow Extension  - 1 x daily - 7 x weekly - 2 sets - 5 reps - 5 - 10 sec  hold - Seated Scapular Retraction  - 1 x daily - 7 x weekly - 2 sets - 5 reps -  5-10 sec  hold - Putty Squeezes  - 1 x daily - 7 x weekly - 3 sets - 10 reps - Seated Shoulder Row with Anchored Resistance  - 1 x daily - 7 x weekly - 2 sets - 10 reps - Seated Shoulder Internal Rotation with Anchored Resistance  - 1 x daily - 7 x weekly - 2 sets - 10 reps - Seated Bilateral Shoulder External Rotation with Resistance  - 1 x daily - 7 x weekly - 2 sets - 10 reps - Supine Shoulder Press with Dowel  - 1 x daily - 7 x weekly - 2 sets - 10 reps - Supine Shoulder Flexion AAROM with Hands  Clasped  - 1 x daily - 7 x weekly - 2 sets - 10 reps - Seated Shoulder Extension and Scapular Retraction with Resistance  - 1 x daily - 7 x weekly - 2 sets - 10 reps - Seated Shoulder Horizontal Abduction with Resistance - Palms Down  - 1 x daily - 7 x weekly - 2 sets - 10 reps  ASSESSMENT:  CLINICAL IMPRESSION: Progressed TE to improve L shoulder strength and ROM. Provided instruction as needed with exercises to correct form and for slow/controlled movement. Able to progress TB exercises with no issues. Will continue progressing exercises to improve function.   OBJECTIVE IMPAIRMENTS: Abnormal gait, decreased activity tolerance, decreased balance, decreased coordination, decreased endurance, decreased mobility, difficulty walking, decreased ROM, decreased strength, decreased safety awareness, increased muscle spasms, impaired UE functional use, postural dysfunction, and pain.   ACTIVITY LIMITATIONS: carrying, lifting, bending, sleeping, transfers, bed mobility, dressing, reach over head, hygiene/grooming, and locomotion level  PARTICIPATION LIMITATIONS: meal prep, cleaning, laundry, and community activity  PERSONAL FACTORS: Fitness and 3+ comorbidities: Anxiety and depression, cerebellar ataxia, Raynaud disease (feet), osteoporosis, emphysema (former smoker)  are also affecting patient's functional outcome.   REHAB POTENTIAL: Good  CLINICAL DECISION MAKING: Evolving/moderate complexity  EVALUATION COMPLEXITY: Moderate  GOALS: Goals reviewed with patient? Yes  SHORT TERM GOALS: Target date: 11/25/2022   Patient will be independent with initial HEP.  Baseline: needs Goal status: IN PROGRESS 11/20/22- reports compliance, cues needed  2.  Patient will have LE strength/functional strength evaluated.   Baseline: not performed today.  Goal status: INITIAL  3.  Patient will complete TUG.  Baseline: not performed today Goal status: INITIAL   LONG TERM GOALS: Target date: 01/06/2023  extended to   Patient will be independent with advanced/ongoing HEP to improve outcomes and carryover.  Baseline:  Goal status: IN PROGRESS  2.  Patient will report 75% improvement in left shoulder pain to improve QOL.  Baseline:  Goal status: IN PROGRESS  3.  Patient to improve Left shoulder AROM to [facility] without pain provocation to allow for increased ease of ADLs.  Baseline: see objective Goal status: IN PROGRESS  4.  Patient will report at least 10% improvement on QuickDash  to demonstrate improved functional ability.  Baseline: 56.8% disability Goal status: IN PROGRESS  6.  Patient will be able to reach behind head and back without increased pain to perform ADLs.     Baseline: unable due to pain Goal status: IN PROGRESS   7. Patient will demonstrate improved functional LE strength with goal TBD to decrease risk of falls.   Baseline: not assessed Goal status: INITIAL  8. Patient will be able to complete TUG in less than 25 seconds with AD in order to decrease fall risk.   Baseline: NT Goal status: IN PROGRESS  PLAN:  PT FREQUENCY: 1-2x/week  PT DURATION: 8 weeks  PLANNED INTERVENTIONS: Therapeutic exercises, Therapeutic activity, Neuromuscular re-education, Balance training, Gait training, Patient/Family education, Self Care, Joint mobilization, Dry Needling, Electrical stimulation, Spinal mobilization, Cryotherapy, Moist heat, Taping, Ultrasound, Ionotophoresis '4mg'$ /ml Dexamethasone, Manual therapy, and Re-evaluation  PLAN FOR NEXT SESSION: continue to progress L shoulder strength and ROM as tolerated,  modalities/manual therapy for L shoulder PRN, will assess LE/TUG/5xSTS, mCTSIB at later date.    Artist Pais, PTA 01/13/2023, 4:26 PM

## 2023-01-20 ENCOUNTER — Encounter: Payer: Self-pay | Admitting: Physical Therapy

## 2023-01-20 ENCOUNTER — Ambulatory Visit: Payer: Medicare Other | Admitting: Physical Therapy

## 2023-01-20 DIAGNOSIS — R296 Repeated falls: Secondary | ICD-10-CM | POA: Diagnosis not present

## 2023-01-20 DIAGNOSIS — M6281 Muscle weakness (generalized): Secondary | ICD-10-CM

## 2023-01-20 DIAGNOSIS — M25612 Stiffness of left shoulder, not elsewhere classified: Secondary | ICD-10-CM

## 2023-01-20 DIAGNOSIS — M25512 Pain in left shoulder: Secondary | ICD-10-CM | POA: Diagnosis not present

## 2023-01-20 NOTE — Therapy (Signed)
OUTPATIENT PHYSICAL THERAPY TREATMENT   Patient Name: Sherry Zimmerman MRN: [id number] DOB:[birth year], 67 y.o., female Today's Date: 01/20/2023  END OF SESSION:  PT End of Session - 01/20/23 1707     Visit Number 7    Number of Visits 12    Date for PT Re-Evaluation 02/18/23    Authorization Type [employer]    Progress Note Due on Visit 15    PT Start Time 1705    PT Stop Time 1749    PT Time Calculation (min) 44 min    Activity Tolerance Patient tolerated treatment well    Behavior During Therapy WFL for tasks assessed/performed                Past Medical History:  Diagnosis Date   Anxiety    AR (allergic rhinitis)    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   Osteoporosis 07/08/2021   DEXA 03/2021: lowest T = -3.3, L femur; forearm -2.2; started tx 07/2021   Raynaud disease 05/13/2018   feet   Splenic rupture 10/26/14   Past Surgical History:  Procedure Laterality Date   ABDOMINAL HYSTERECTOMY     CATARACT EXTRACTION Bilateral 01/17/2020   SPLENECTOMY, TOTAL N/A 10/26/2014   Procedure: SPLENECTOMY;  Surgeon: Georganna Skeans, MD;  Location: [facility] OR;  Service: General;  Laterality: N/A;   WRIST FRACTURE SURGERY Right    Patient Active Problem List   Diagnosis Date Noted   Osteoporosis 07/08/2021   Former smoker 07/08/2021   On statin therapy due to risk of future cardiovascular event 11/18/2019   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   Migraine without aura and without status migrainosus, not intractable 01/10/2019   Major depression, recurrent, chronic ([place]) 08/13/2018   Acquired iron deficiency anemia due to decreased absorption 06/18/2018   Raynaud  disease 05/13/2018   Emphysema of lung ([place]) 05/13/2018   AR (allergic rhinitis) 01/08/2018   OAB (overactive bladder) 11/09/2014   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  Doesn't seem to be improving.  Denies new falls.    Left hand dominant.   PERTINENT HISTORY: Steroid injection L shoulder 10/20/22 for possible partial tear L RTC  Anxiety and depression, cerebellar ataxia, Raynaud disease (feet), osteoporosis, emphysema (former smoker)  PAIN:  Are you having pain? Yes: NPRS scale: 2/10 Pain location: L shoulder  Pain description: hurt all the time, now better, just tender Aggravating factors: lifting - can't lift > 16 oz due to pain,  Relieving factors: motrin, steroid shot  PRECAUTIONS: Fall  WEIGHT BEARING RESTRICTIONS: No  FALLS:  Has patient fallen in last 6 months? Yes. Number of falls at least 15, falls almost daily  LIVING ENVIRONMENT: Lives with: lives with their spouse Lives in: House/apartment Stairs: Yes: External: 1 steps; on right going up Has following equipment at home: Environmental consultant - 4 wheeled, Shower bench, Grab bars, and 3 wheeled walker  OCCUPATION: Permanent disability  PLOF: Independent with household mobility with device  PATIENT GOALS:  improve shoulder pain  NEXT MD VISIT:  no follow-up scheduled  OBJECTIVE:   DIAGNOSTIC FINDINGS:  10/10/22 DG Shoulder left FINDINGS: There is no evidence of fracture or dislocation. There is no evidence of arthropathy or other focal bone abnormality. Soft tissues are unremarkable.  PATIENT SURVEYS:  Quick Dash 56.8% impairment  COGNITION: Overall cognitive status: Within functional limits for tasks assessed  OBSERVATION: Patient enters with 4WRW, bil genu valugum, knee hyperextension in stance phase, accompanied by fiance.  Decreased muscle bulk noted throughout, no apparent distress, however winces with L arm movements, and poor motor control noted raising arm.      SENSATION: Light touch: WFL  POSTURE: Sacral sits, rounded shoulders, forward head.    UPPER EXTREMITY ROM:   Active ROM Right eval Left eval Left A/PROM 01/06/23  Shoulder flexion 135 90* 110/120 *  Shoulder extension 90 50* NT*  Shoulder abduction 130 90* 94/119*  Shoulder adduction     Shoulder internal rotation To T6 Unable due to pain Functionally thumb to PSIS 38/52*  Shoulder external rotation To Right scapula Several tries need to bring to L shoulder, limited ER * Able to reach behind head 68/68*  (Blank rows = not tested) *increased pain  UPPER EXTREMITY MMT:  MMT Right eval Left eval Left 01/06/23  Shoulder flexion 5 4+* 4+*  Shoulder extension   Releases due to pain  Shoulder abduction 5 3* 5*  Shoulder internal rotation 5 4+* 4+  Shoulder external rotation 5 4+* 4+*  Elbow flexion 5 4+* 4+  Elbow extension 5 5   Wrist flexion 5 5   Wrist extension 5 5   Grip strength  good good   (Blank rows = not tested)  *pain with resistance  SHOULDER SPECIAL TESTS: Impingement tests: Painful arc test: positive   JOINT MOBILITY TESTING:  NT  PALPATION:  Increased pain over L corocoid process, biceps and RTC insertion   TODAY'S TREATMENT:  DATE:  01/20/2023 Therapeutic Exercise: to improve strength and mobility.  Demo, verbal and tactile cues throughout for technique. Table slides flexion x 20, scaption x 20, IR/ER x 20, ball rolls on table  CW x 20, CCW x 20 Manual Therapy: to decrease muscle spasm and pain and improve mobility STM/TPR to L infraspinatus, K-taping to L shoulder to support (3 strips), skilled palpation and monitoring during dry needling. Trigger Point Dry-Needling  Treatment instructions: Expect mild to moderate muscle soreness. S/S of pneumothorax if dry needled over a lung field, and to seek immediate medical attention should they occur. Patient verbalized understanding of these instructions and education. Patient Consent Given: Yes Education handout provided: Previously provided Muscles treated: L infraspinatus Electrical stimulation performed: No Parameters: N/A Treatment response/outcome: Twitch Response Elicited and Palpable Increase in Muscle Length  01/13/23 Therapeutic Exercise: to improve strength and mobility.  Demo, verbal and tactile cues throughout for technique. Nustep [id number] Supine shoulder flexion with cane x 10  Supine chest press with cane x 10  Supine horizontal ABD RTB x 10  Supine Alt diagonals RTB x 10 S/L L shoulder abduction x 10  S/L L shoulder ER x 10 towel at side  01/06/2023 Therapeutic Exercise: to improve strength and mobility.  Demo, verbal and tactile cues throughout for technique. UBE L1 x 4 min forward ROM with TB flex and abd x 10 ea seated ROM flex with cane x 5 Seated rows YTB 2 x 10 Seated ext YTB, 2x 10 Seated horizontal ABD YTB 2x10 Seated bil shoulder ER  x 20 YTB Seated shoulder IR YTB x 20 YTB SDLY ER x 15 Supine Serratus Push x 20 L  Strength and ROM reassessed  PATIENT EDUCATION: Education details: Kinesiotaping Person educated: Patient Education method: Explanation and  Handouts Education comprehension: verbalized understanding  HOME EXERCISE PROGRAM: Access Code: [email or web address]: [email or web address] Date: 01/06/2023 Prepared by: JAlmyra Free Exercises - Seated Shoulder Rolls  - 1 x daily - 7 x weekly - 2 sets - 10 reps - Seated Isometric Shoulder Internal Rotation with Towel  - 1 x daily - 7 x weekly - 2 sets - 5 reps - 5-10 sec hold - Seated Isometric Shoulder External Rotation  - 1 x daily - 7 x weekly - 2 sets - 5 reps - 5-10 sec hold - Seated Isometric Shoulder Flexion  - 1 x daily - 7 x weekly - 2 sets - 5 reps - 5-10 sec hold - Seated Isometric Elbow Extension  - 1 x daily - 7 x weekly - 2 sets - 5 reps - 5 - 10 sec  hold - Seated Scapular Retraction  - 1 x daily - 7 x weekly - 2 sets - 5 reps - 5-10 sec  hold - Putty Squeezes  - 1 x daily - 7 x weekly - 3 sets - 10 reps - Seated Shoulder Row with Anchored Resistance  - 1 x daily - 7 x weekly - 2 sets - 10 reps - Seated Shoulder Internal Rotation with Anchored Resistance  - 1 x daily - 7 x weekly - 2 sets - 10 reps - Seated Bilateral Shoulder External Rotation with Resistance  - 1 x daily - 7 x weekly - 2 sets - 10 reps - Supine Shoulder Press with Dowel  - 1 x daily - 7 x weekly - 2 sets - 10 reps - Supine Shoulder Flexion AAROM with Hands Clasped  - 1 x daily - 7 x weekly - 2 sets -  10 reps - Seated Shoulder Extension and Scapular Retraction with Resistance  - 1 x daily - 7 x weekly - 2 sets - 10 reps - Seated Shoulder Horizontal Abduction with Resistance - Palms Down  - 1 x daily - 7 x weekly - 2 sets - 10 reps  ASSESSMENT:  CLINICAL IMPRESSION: Shawnna Borton reports continued L shoulder pain with all overhead movements, but good compliance with HEP.   Noted trigger point in L infraspinatus, patient brought up interest in trialing TrDN again, good twitch response in infraspinatus.  Finished with K-tape to L shoulder to improve support, as noted significant crepitus with all movements.   Andralyn Gaunce continues to demonstrate potential for improvement and would benefit from continued skilled therapy to address impairments.      OBJECTIVE IMPAIRMENTS: Abnormal gait, decreased activity tolerance, decreased balance, decreased coordination, decreased endurance, decreased mobility, difficulty walking, decreased ROM, decreased strength, decreased safety awareness, increased muscle spasms, impaired UE functional use, postural dysfunction, and pain.   ACTIVITY LIMITATIONS: carrying, lifting, bending, sleeping, transfers, bed mobility, dressing, reach over head, hygiene/grooming, and locomotion level  PARTICIPATION LIMITATIONS: meal prep, cleaning, laundry, and community activity  PERSONAL FACTORS: Fitness and 3+ comorbidities: Anxiety and depression, cerebellar ataxia, Raynaud disease (feet), osteoporosis, emphysema (former smoker)  are also affecting patient's functional outcome.   REHAB POTENTIAL: Good  CLINICAL DECISION MAKING: Evolving/moderate complexity  EVALUATION COMPLEXITY: Moderate  GOALS: Goals reviewed with patient? Yes  SHORT TERM GOALS: Target date: 11/25/2022   Patient will be independent with initial HEP.  Baseline: needs Goal status: IN PROGRESS 11/20/22- reports compliance, cues needed  2.  Patient will have LE strength/functional strength evaluated.   Baseline: not performed today.  Goal status: INITIAL  3.  Patient will complete TUG.  Baseline: not performed today Goal status: INITIAL   LONG TERM GOALS: Target date: 01/06/2023 extended to   Patient will be independent with advanced/ongoing HEP to improve outcomes and carryover.  Baseline:  Goal status: IN PROGRESS  2.  Patient will report 75% improvement in left shoulder pain to improve QOL.  Baseline:  Goal status: IN PROGRESS  3.  Patient to improve Left shoulder AROM to [facility] without pain provocation to allow for increased ease of ADLs.  Baseline: see objective Goal status: IN  PROGRESS  4.  Patient will report at least 10% improvement on QuickDash  to demonstrate improved functional ability.  Baseline: 56.8% disability Goal status: IN PROGRESS  6.  Patient will be able to reach behind head and back without increased pain to perform ADLs.     Baseline: unable due to pain Goal status: IN PROGRESS   7. Patient will demonstrate improved functional LE strength with goal TBD to decrease risk of falls.   Baseline: not assessed Goal status: INITIAL  8. Patient will be able to complete TUG in less than 25 seconds with AD in order to decrease fall risk.   Baseline: NT Goal status: IN PROGRESS   PLAN:  PT FREQUENCY: 1-2x/week  PT DURATION: 8 weeks  PLANNED INTERVENTIONS: Therapeutic exercises, Therapeutic activity, Neuromuscular re-education, Balance training, Gait training, Patient/Family education, Self Care, Joint mobilization, Dry Needling, Electrical stimulation, Spinal mobilization, Cryotherapy, Moist heat, Taping, Ultrasound, Ionotophoresis 7m/ml Dexamethasone, Manual therapy, and Re-evaluation  PLAN FOR NEXT SESSION: continue to progress L shoulder strength and ROM as tolerated,  modalities/manual therapy for L shoulder PRN, will assess LE/TUG/5xSTS, mCTSIB at later date.    ERennie Natter PT, DPT  01/20/2023, 6:03 PM

## 2023-01-20 NOTE — Patient Instructions (Signed)
   Kinesiology tape  What is kinesiology tape?  There are many brands of kinesiology tape. KTape, Rock Tape, Body Sport, Dynamic tape, to name a few.  It is an elasticized tape designed to support the body's natural healing process. This tape provides stability and support to muscles and joints without restricting motion.  It can also help decrease swelling in the area of application.  How does it work?  The tape microscopically lifts and decompresses the skin to allow for drainage of lymph (swelling) to flow away from area, reducing inflammation. The tape has the ability to help re-educate the neuromuscular system by targeting specific receptors in the skin. The presence of the tape increases the body's awareness of posture and body mechanics.  Do not use with:  . Open wounds . Skin lesions . Adhesive allergies  In some rare cases, mild/moderate skin irritation can occur. This can include redness, itchiness, or hives. If this occurs, immediately remove tape and consult your primary care physician if symptoms are severe or do not resolve within 2 days.  Safe removal of the tape:  To remove tape safely, hold nearby skin with one hand and gentle roll tape down with other hand. You can apply oil or conditioner to tape while in shower prior to removal to loosen adhesive. DO NOT swiftly rip tape off like a band-aid, as this could cause skin tears and additional skin irritation.     For questions, please contact your therapist at:  [facility] [place] [address]  [address] [place], [place], [address] Phone: [phone]   Fax:  [phone]     

## 2023-01-21 ENCOUNTER — Ambulatory Visit: Payer: Medicare Other | Admitting: Physical Therapy

## 2023-01-26 NOTE — Therapy (Signed)
OUTPATIENT PHYSICAL THERAPY TREATMENT   Patient Name: Sherry Zimmerman MRN: [id number] DOB:[birth year], 67 y.o., female Today's Date: 01/27/2023  END OF SESSION:  PT End of Session - 01/27/23 1351     Visit Number 8    Number of Visits 12    Date for PT Re-Evaluation 02/18/23    Authorization Type [employer]    Progress Note Due on Visit 15    PT Start Time 1350    PT Stop Time 1435    PT Time Calculation (min) 45 min    Activity Tolerance Patient tolerated treatment well    Behavior During Therapy WFL for tasks assessed/performed                Past Medical History:  Diagnosis Date   Anxiety    AR (allergic rhinitis)    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   Osteoporosis 07/08/2021   DEXA 03/2021: lowest T = -3.3, L femur; forearm -2.2; started tx 07/2021   Raynaud disease 05/13/2018   feet   Splenic rupture 10/26/14   Past Surgical History:  Procedure Laterality Date   ABDOMINAL HYSTERECTOMY     CATARACT EXTRACTION Bilateral 01/17/2020   SPLENECTOMY, TOTAL N/A 10/26/2014   Procedure: SPLENECTOMY;  Surgeon: Georganna Skeans, MD;  Location: [facility] OR;  Service: General;  Laterality: N/A;   WRIST FRACTURE SURGERY Right    Patient Active Problem List   Diagnosis Date Noted   Osteoporosis 07/08/2021   Former smoker 07/08/2021   On statin therapy due to risk of future cardiovascular event 11/18/2019   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   Migraine without aura and without status migrainosus, not intractable 01/10/2019   Major depression, recurrent, chronic ([place]) 08/13/2018   Acquired iron deficiency anemia due to decreased absorption 06/18/2018   Raynaud  disease 05/13/2018   Emphysema of lung ([place]) 05/13/2018   AR (allergic rhinitis) 01/08/2018   OAB (overactive bladder) 11/09/2014   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   Denies new falls.    Left hand dominant.   PERTINENT HISTORY: Steroid injection L shoulder 10/20/22 for possible partial tear L RTC  Anxiety and depression, cerebellar ataxia, Raynaud disease (feet), osteoporosis, emphysema (former smoker)  PAIN:  Are you having pain? Yes: NPRS scale: 2/10 Pain location: L shoulder  Pain description: hurt all the time, now better, just tender Aggravating factors: lifting - can't lift > 16 oz due to pain,  Relieving factors: motrin, steroid shot  PRECAUTIONS: Fall  WEIGHT BEARING RESTRICTIONS: No  FALLS:  Has patient fallen in last 6 months? Yes. Number of falls at least 15, falls almost daily  LIVING ENVIRONMENT: Lives with: lives with their spouse Lives in: House/apartment Stairs: Yes: External: 1 steps; on right going up Has following equipment at home: Environmental consultant - 4 wheeled, Shower bench, Grab bars, and 3 wheeled walker  OCCUPATION: Permanent disability  PLOF: Independent with household mobility with device  PATIENT GOALS: improve shoulder  pain  NEXT MD VISIT:  no follow-up scheduled  OBJECTIVE:   DIAGNOSTIC FINDINGS:  10/10/22 DG Shoulder left FINDINGS: There is no evidence of fracture or dislocation. There is no evidence of arthropathy or other focal bone abnormality. Soft tissues are unremarkable.  PATIENT SURVEYS:  Quick Dash 56.8% impairment  COGNITION: Overall cognitive status: Within functional limits for tasks assessed  OBSERVATION: Patient enters with 4WRW, bil genu valugum, knee hyperextension in stance phase, accompanied by fiance.  Decreased muscle bulk noted throughout, no apparent distress, however winces with L arm movements, and poor motor control noted raising arm.      SENSATION: Light touch: WFL  POSTURE: Sacral sits, rounded shoulders, forward head.    UPPER EXTREMITY ROM:   Active ROM Right eval Left eval Left A/PROM 01/06/23  Shoulder flexion 135 90* 110/120 *  Shoulder extension 90 50* NT*  Shoulder abduction 130 90* 94/119*  Shoulder adduction     Shoulder internal rotation To T6 Unable due to pain Functionally thumb to PSIS 38/52*  Shoulder external rotation To Right scapula Several tries need to bring to L shoulder, limited ER * Able to reach behind head 68/68*  (Blank rows = not tested) *increased pain  UPPER EXTREMITY MMT:  MMT Right eval Left eval Left 01/06/23  Shoulder flexion 5 4+* 4+*  Shoulder extension   Releases due to pain  Shoulder abduction 5 3* 5*  Shoulder internal rotation 5 4+* 4+  Shoulder external rotation 5 4+* 4+*  Elbow flexion 5 4+* 4+  Elbow extension 5 5   Wrist flexion 5 5   Wrist extension 5 5   Grip strength  good good   (Blank rows = not tested)  *pain with resistance  SHOULDER SPECIAL TESTS: Impingement tests: Painful arc test: positive   JOINT MOBILITY TESTING:  NT  PALPATION:  Increased pain over L corocoid process, biceps and RTC insertion   TODAY'S TREATMENT:  DATE:   01/06/2023 Therapeutic Exercise: to improve strength and mobility.  Demo, verbal and tactile cues throughout for technique. Nustep L 6 x 6 min Standing flex to 2nd shelf with 1# weight x 8 Seated flex/scap/abd x 10 in pain free range (needs cues to avoid going into pain) Supine serratus push 2# x 20  Manual Therapy: to decrease muscle spasm and pain and improve mobility STM to left UT IS, rhomboids and lats, skilled palpation and monitoring during dry needling. Trigger Point Dry-Needling  Treatment instructions: Expect mild to moderate muscle soreness. S/S of pneumothorax if dry needled over a lung field, and to seek immediate medical attention should they occur. Patient verbalized understanding of these instructions and education. Patient Consent Given: Yes Education handout provided: Previously provided Muscles treated: L subscapularis, lats, infraspinatus and UT Electrical stimulation performed: No Parameters: N/A Treatment response/outcome: Twitch Response Elicited and Palpable Increase in Muscle Length   01/20/2023 Therapeutic Exercise: to improve strength and mobility.  Demo, verbal and tactile cues throughout for technique. Table slides flexion x 20, scaption x 20, IR/ER x 20, ball rolls on table  CW x 20, CCW x 20 Manual Therapy: to decrease muscle spasm and pain and improve mobility STM/TPR to L infraspinatus, K-taping to L shoulder to support (3 strips), skilled palpation and monitoring during dry needling. Trigger Point Dry-Needling  Treatment instructions: Expect mild to moderate muscle soreness. S/S of pneumothorax if dry needled over a lung field, and to seek immediate medical attention should they occur. Patient verbalized understanding of these instructions and education. Patient Consent Given: Yes Education handout provided: Previously provided Muscles treated: L infraspinatus Electrical stimulation  performed: No Parameters: N/A Treatment response/outcome: Twitch Response Elicited and Palpable Increase in Muscle Length  01/13/23 Therapeutic Exercise: to improve strength and mobility.  Demo, verbal and tactile cues throughout for technique. Nustep L4x28mn Supine shoulder flexion with cane x 10  Supine chest press with cane x 10  Supine horizontal ABD RTB x 10  Supine Alt diagonals RTB x 10 S/L L shoulder abduction x 10  S/L L shoulder ER x 10 towel at side  01/06/2023 Therapeutic Exercise: to improve strength and mobility.  Demo, verbal and tactile cues throughout for technique. UBE L1 x 4 min forward ROM with TB flex and abd x 10 ea seated ROM flex with cane x 5 Seated rows YTB 2 x 10 Seated ext YTB, 2x 10 Seated horizontal ABD YTB 2x10 Seated bil shoulder ER  x 20 YTB Seated shoulder IR YTB x 20 YTB SDLY ER x 15 Supine Serratus Push x 20 L  Strength and ROM reassessed  PATIENT EDUCATION: Education details: Kinesiotaping Person educated: Patient Education method: Explanation and Handouts Education comprehension: verbalized understanding  HOME EXERCISE PROGRAM: Access Code: [email or web address]: [email or web address] Date: 01/06/2023 Prepared by: JAlmyra Free Exercises - Seated Shoulder Rolls  - 1 x daily - 7 x weekly - 2 sets - 10 reps - Seated Isometric Shoulder Internal Rotation with Towel  - 1 x daily - 7 x weekly - 2 sets - 5 reps - 5-10 sec hold - Seated Isometric Shoulder External Rotation  - 1 x daily - 7 x weekly - 2 sets - 5 reps - 5-10 sec hold - Seated Isometric Shoulder Flexion  - 1 x daily - 7 x weekly - 2 sets - 5 reps - 5-10 sec hold - Seated Isometric Elbow Extension  - 1 x daily - 7 x weekly - 2 sets - 5 reps - 5 -  10 sec  hold - Seated Scapular Retraction  - 1 x daily - 7 x weekly - 2 sets - 5 reps - 5-10 sec  hold - Putty Squeezes  - 1 x daily - 7 x weekly - 3 sets - 10 reps - Seated Shoulder Row with Anchored Resistance  - 1 x daily - 7 x weekly -  2 sets - 10 reps - Seated Shoulder Internal Rotation with Anchored Resistance  - 1 x daily - 7 x weekly - 2 sets - 10 reps - Seated Bilateral Shoulder External Rotation with Resistance  - 1 x daily - 7 x weekly - 2 sets - 10 reps - Supine Shoulder Press with Dowel  - 1 x daily - 7 x weekly - 2 sets - 10 reps - Supine Shoulder Flexion AAROM with Hands Clasped  - 1 x daily - 7 x weekly - 2 sets - 10 reps - Seated Shoulder Extension and Scapular Retraction with Resistance  - 1 x daily - 7 x weekly - 2 sets - 10 reps - Seated Shoulder Horizontal Abduction with Resistance - Palms Down  - 1 x daily - 7 x weekly - 2 sets - 10 reps  ASSESSMENT:  CLINICAL IMPRESSION: [place] reported some relief with previous DN. She has improved mobility, but still reports pain at end range and with resistance. Attempted some light resistance with functional movements, but limited by pain. Additional DN to L parascapular muscles with excellent response. She would benefit from further DN to L UT. [place] continues to demonstrate potential for improvement and would benefit from continued skilled therapy to address impairments.      OBJECTIVE IMPAIRMENTS: Abnormal gait, decreased activity tolerance, decreased balance, decreased coordination, decreased endurance, decreased mobility, difficulty walking, decreased ROM, decreased strength, decreased safety awareness, increased muscle spasms, impaired UE functional use, postural dysfunction, and pain.   ACTIVITY LIMITATIONS: carrying, lifting, bending, sleeping, transfers, bed mobility, dressing, reach over head, hygiene/grooming, and locomotion level  PARTICIPATION LIMITATIONS: meal prep, cleaning, laundry, and community activity  PERSONAL FACTORS: Fitness and 3+ comorbidities: Anxiety and depression, cerebellar ataxia, Raynaud disease (feet), osteoporosis, emphysema (former smoker)  are also affecting patient's functional outcome.   REHAB POTENTIAL: Good  CLINICAL DECISION  MAKING: Evolving/moderate complexity  EVALUATION COMPLEXITY: Moderate  GOALS: Goals reviewed with patient? Yes  SHORT TERM GOALS: Target date: 11/25/2022   Patient will be independent with initial HEP.  Baseline: needs Goal status: IN PROGRESS 11/20/22- reports compliance, cues needed  2.  Patient will have LE strength/functional strength evaluated.   Baseline: not performed today.  Goal status: INITIAL  3.  Patient will complete TUG.  Baseline: not performed today Goal status: INITIAL   LONG TERM GOALS: Target date: 01/06/2023 extended to 02/18/23  Patient will be independent with advanced/ongoing HEP to improve outcomes and carryover.  Baseline:  Goal status: IN PROGRESS  2.  Patient will report 75% improvement in left shoulder pain to improve QOL.  Baseline:  Goal status: IN PROGRESS  3.  Patient to improve Left shoulder AROM to [facility] without pain provocation to allow for increased ease of ADLs.  Baseline: see objective Goal status: IN PROGRESS  4.  Patient will report at least 10% improvement on QuickDash  to demonstrate improved functional ability.  Baseline: 56.8% disability Goal status: IN PROGRESS  6.  Patient will be able to reach behind head and back without increased pain to perform ADLs.     Baseline: unable due to pain Goal status: IN  PROGRESS   7. Patient will demonstrate improved functional LE strength with goal TBD to decrease risk of falls.   Baseline: not assessed Goal status: INITIAL  8. Patient will be able to complete TUG in less than 25 seconds with AD in order to decrease fall risk.   Baseline: NT Goal status: IN PROGRESS   PLAN:  PT FREQUENCY: 1-2x/week  PT DURATION: 8 weeks  PLANNED INTERVENTIONS: Therapeutic exercises, Therapeutic activity, Neuromuscular re-education, Balance training, Gait training, Patient/Family education, Self Care, Joint mobilization, Dry Needling, Electrical stimulation, Spinal mobilization, Cryotherapy, Moist  heat, Taping, Ultrasound, Ionotophoresis 70m/ml Dexamethasone, Manual therapy, and Re-evaluation  PLAN FOR NEXT SESSION: continue to progress L shoulder strength and ROM as tolerated,  modalities/manual therapy for L shoulder PRN, will assess LE/TUG/5xSTS, mCTSIB at later date.    Pierre Dellarocco, PT 01/27/2023, 2:46 PM

## 2023-01-27 ENCOUNTER — Encounter: Payer: Self-pay | Admitting: Physical Therapy

## 2023-01-27 ENCOUNTER — Ambulatory Visit: Payer: Medicare Other | Admitting: Physical Therapy

## 2023-01-27 DIAGNOSIS — M25612 Stiffness of left shoulder, not elsewhere classified: Secondary | ICD-10-CM | POA: Diagnosis not present

## 2023-01-27 DIAGNOSIS — M6281 Muscle weakness (generalized): Secondary | ICD-10-CM

## 2023-01-27 DIAGNOSIS — M25512 Pain in left shoulder: Secondary | ICD-10-CM

## 2023-01-27 DIAGNOSIS — R296 Repeated falls: Secondary | ICD-10-CM | POA: Diagnosis not present

## 2023-02-03 ENCOUNTER — Ambulatory Visit: Payer: Medicare Other

## 2023-02-03 DIAGNOSIS — M6281 Muscle weakness (generalized): Secondary | ICD-10-CM | POA: Diagnosis not present

## 2023-02-03 DIAGNOSIS — M25612 Stiffness of left shoulder, not elsewhere classified: Secondary | ICD-10-CM | POA: Diagnosis not present

## 2023-02-03 DIAGNOSIS — M25512 Pain in left shoulder: Secondary | ICD-10-CM | POA: Diagnosis not present

## 2023-02-03 DIAGNOSIS — R296 Repeated falls: Secondary | ICD-10-CM | POA: Diagnosis not present

## 2023-02-03 NOTE — Therapy (Signed)
OUTPATIENT PHYSICAL THERAPY TREATMENT   Patient Name: Sherry Zimmerman MRN: [id number] DOB:24-Jan-1956, 67 y.o., female Today's Date: 02/03/2023  END OF SESSION:  PT End of Session - 02/03/23 1325     Visit Number 9    Number of Visits 12    Date for PT Re-Evaluation 02/18/23    Authorization Type [employer]    Progress Note Due on Visit 15    PT Start Time 1316    PT Stop Time 1400    PT Time Calculation (min) 44 min    Activity Tolerance Patient tolerated treatment well    Behavior During Therapy WFL for tasks assessed/performed                 Past Medical History:  Diagnosis Date   Anxiety    AR (allergic rhinitis)    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   Osteoporosis 07/08/2021   DEXA 03/2021: lowest T = -3.3, L femur; forearm -2.2; started tx 07/2021   Raynaud disease 05/13/2018   feet   Splenic rupture 10/26/14   Past Surgical History:  Procedure Laterality Date   ABDOMINAL HYSTERECTOMY     CATARACT EXTRACTION Bilateral 01/17/2020   SPLENECTOMY, TOTAL N/A 10/26/2014   Procedure: SPLENECTOMY;  Surgeon: Georganna Skeans, MD;  Location: [facility] OR;  Service: General;  Laterality: N/A;   WRIST FRACTURE SURGERY Right    Patient Active Problem List   Diagnosis Date Noted   Osteoporosis 07/08/2021   Former smoker 07/08/2021   On statin therapy due to risk of future cardiovascular event 11/18/2019   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   Migraine without aura and without status migrainosus, not intractable 01/10/2019   Major depression, recurrent, chronic ([place]) 08/13/2018   Acquired iron deficiency anemia due to decreased absorption 06/18/2018   Raynaud  disease 05/13/2018   Emphysema of lung ([place]) 05/13/2018   AR (allergic rhinitis) 01/08/2018   OAB (overactive bladder) 11/09/2014   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  Left hand dominant.   PERTINENT HISTORY: Steroid injection L shoulder 10/20/22 for possible partial tear L RTC  Anxiety and depression, cerebellar ataxia, Raynaud disease (feet), osteoporosis, emphysema (former smoker)  PAIN:  Are you having pain? Yes: NPRS scale: 0/10 Pain location: L shoulder  Pain description: hurt all the time, now better, just tender Aggravating factors: lifting - can't lift > 16 oz due to pain,  Relieving factors: motrin, steroid shot  PRECAUTIONS: Fall  WEIGHT BEARING RESTRICTIONS: No  FALLS:  Has patient fallen in last 6 months? Yes. Number of falls at least 15, falls almost daily  LIVING ENVIRONMENT: Lives with: lives with their spouse Lives in: House/apartment Stairs: Yes: External: 1 steps; on right going up Has following equipment at home: Environmental consultant - 4 wheeled, Shower bench, Grab bars, and 3 wheeled walker  OCCUPATION: Permanent disability  PLOF: Independent with household mobility with device  PATIENT GOALS: improve  shoulder pain  NEXT MD VISIT:  no follow-up scheduled  OBJECTIVE:   DIAGNOSTIC FINDINGS:  10/10/22 DG Shoulder left FINDINGS: There is no evidence of fracture or dislocation. There is no evidence of arthropathy or other focal bone abnormality. Soft tissues are unremarkable.  PATIENT SURVEYS:  Quick Dash 56.8% impairment  COGNITION: Overall cognitive status: Within functional limits for tasks assessed  OBSERVATION: Patient enters with 4WRW, bil genu valugum, knee hyperextension in stance phase, accompanied by fiance.  Decreased muscle bulk noted throughout, no apparent distress, however winces with L arm movements, and poor motor control noted raising arm.      SENSATION: Light touch: WFL  POSTURE: Sacral sits, rounded shoulders, forward head.    UPPER EXTREMITY ROM:   Active ROM Right eval Left eval Left A/PROM 01/06/23 Left 02/03/23  Shoulder flexion 135 90* 110/120 * 140  Shoulder extension 90 50* NT*   Shoulder abduction 130 90* 94/119* 135- mild pain  Shoulder adduction      Shoulder internal rotation To T6 Unable due to pain Functionally thumb to PSIS 38/52* FIR to L4-L5  Shoulder external rotation To Right scapula Several tries need to bring to L shoulder, limited ER * Able to reach behind head 68/68* FER to T1  (Blank rows = not tested) *increased pain  UPPER EXTREMITY MMT:  MMT Right eval Left eval Left 01/06/23  Shoulder flexion 5 4+* 4+*  Shoulder extension   Releases due to pain  Shoulder abduction 5 3* 5*  Shoulder internal rotation 5 4+* 4+  Shoulder external rotation 5 4+* 4+*  Elbow flexion 5 4+* 4+  Elbow extension 5 5   Wrist flexion 5 5   Wrist extension 5 5   Grip strength  good good   (Blank rows = not tested)  *pain with resistance  SHOULDER SPECIAL TESTS: Impingement tests: Painful arc test: positive   JOINT MOBILITY TESTING:  NT  PALPATION:  Increased pain over L corocoid process, biceps and RTC insertion   TODAY'S TREATMENT:  DATE:  02/03/23 Therapeutic Exercise: to improve strength and mobility.  Demo, verbal and tactile cues throughout for technique. UBE L1.0 3 min fwd/3 min back Wall slides flexion x 10  Wall slides abduction x 10  Seated ER with RTB 2x10 Seated horiz ABD RTB 2x10 Seated row RTB 2x15 Seated IR AAROM with towel behind back x 10 Church pew x 10 Retro step x 10 - cues to avoid hyperextending knee Standing calf raise x 10  01/06/2023 Therapeutic Exercise: to improve strength and mobility.  Demo, verbal and tactile cues throughout for technique. Nustep L 6 x 6 min Standing flex to 2nd shelf with 1# weight x 8 Seated flex/scap/abd x 10 in pain free range (needs cues to avoid going into pain) Supine serratus push 2# x 20  Manual Therapy: to decrease muscle spasm and pain and improve mobility STM to left UT IS, rhomboids and lats, skilled palpation and monitoring during dry needling. Trigger Point Dry-Needling  Treatment instructions: Expect mild to moderate muscle soreness. S/S of pneumothorax if dry needled over a lung field, and to seek immediate medical attention should they occur. Patient verbalized understanding of these instructions and education. Patient Consent Given: Yes Education handout provided: Previously provided Muscles treated: L subscapularis, lats, infraspinatus and UT Electrical stimulation performed: No Parameters: N/A Treatment response/outcome: Twitch Response Elicited and Palpable Increase in Muscle Length   01/20/2023 Therapeutic Exercise: to improve strength and mobility.  Demo, verbal and tactile cues throughout for technique. Table slides flexion x 20, scaption x 20, IR/ER x 20, ball rolls on table  CW x 20, CCW x 20 Manual Therapy: to decrease muscle spasm and pain and improve mobility STM/TPR to L infraspinatus, K-taping to L shoulder  to support (3 strips), skilled palpation and monitoring during dry needling. Trigger Point Dry-Needling  Treatment instructions: Expect mild to moderate muscle soreness. S/S of pneumothorax if dry needled over a lung field, and to seek immediate medical attention should they occur. Patient verbalized understanding of these instructions and education. Patient Consent Given: Yes Education handout provided: Previously provided Muscles treated: L infraspinatus Electrical stimulation performed: No Parameters: N/A Treatment response/outcome: Twitch Response Elicited and Palpable Increase in Muscle Length  01/13/23 Therapeutic Exercise: to improve strength and mobility.  Demo, verbal and tactile cues throughout for technique. Nustep L4x50mn Supine shoulder flexion with cane x 10  Supine chest press with cane x 10  Supine horizontal ABD RTB x 10  Supine Alt diagonals RTB x 10 S/L L shoulder abduction x 10  S/L L shoulder ER x 10 towel at side  01/06/2023 Therapeutic Exercise: to improve strength and mobility.  Demo, verbal and tactile cues throughout for technique. UBE L1 x 4 min forward ROM with TB flex and abd x 10 ea seated ROM flex with cane x 5 Seated rows YTB 2 x 10 Seated ext YTB, 2x 10 Seated horizontal ABD YTB 2x10 Seated bil shoulder ER  x 20 YTB Seated shoulder IR YTB x 20 YTB SDLY ER x 15 Supine Serratus Push x 20 L  Strength and ROM reassessed  PATIENT EDUCATION: Education details: Kinesiotaping Person educated: Patient Education method: Explanation and Handouts Education comprehension: verbalized understanding  HOME EXERCISE PROGRAM: Access Code: [email or web address]: [email or web address] Date: 01/06/2023 Prepared by: JAlmyra Free Exercises - Seated Shoulder Rolls  - 1 x daily - 7 x weekly - 2 sets - 10 reps - Seated Isometric Shoulder Internal Rotation with Towel  - 1 x daily - 7 x weekly - 2 sets -  5 reps - 5-10 sec hold - Seated Isometric Shoulder External  Rotation  - 1 x daily - 7 x weekly - 2 sets - 5 reps - 5-10 sec hold - Seated Isometric Shoulder Flexion  - 1 x daily - 7 x weekly - 2 sets - 5 reps - 5-10 sec hold - Seated Isometric Elbow Extension  - 1 x daily - 7 x weekly - 2 sets - 5 reps - 5 - 10 sec  hold - Seated Scapular Retraction  - 1 x daily - 7 x weekly - 2 sets - 5 reps - 5-10 sec  hold - Putty Squeezes  - 1 x daily - 7 x weekly - 3 sets - 10 reps - Seated Shoulder Row with Anchored Resistance  - 1 x daily - 7 x weekly - 2 sets - 10 reps - Seated Shoulder Internal Rotation with Anchored Resistance  - 1 x daily - 7 x weekly - 2 sets - 10 reps - Seated Bilateral Shoulder External Rotation with Resistance  - 1 x daily - 7 x weekly - 2 sets - 10 reps - Supine Shoulder Press with Dowel  - 1 x daily - 7 x weekly - 2 sets - 10 reps - Supine Shoulder Flexion AAROM with Hands Clasped  - 1 x daily - 7 x weekly - 2 sets - 10 reps - Seated Shoulder Extension and Scapular Retraction with Resistance  - 1 x daily - 7 x weekly - 2 sets - 10 reps - Seated Shoulder Horizontal Abduction with Resistance - Palms Down  - 1 x daily - 7 x weekly - 2 sets - 10 reps  ASSESSMENT:  CLINICAL IMPRESSION: Earlie showed good tolerance for exercises today. Continued working on strengthening for postural and RTC muscles. Cues required to avoid bicep curling with rows. Tactile cues for slow/controlled movement during throughout session. Incorporated balance exercises to improve proprioception and stability. Cues given with these exercises to avoid hyperextending knees. Pt continues to demonstrate potential for improvement and would benefit from continued skilled physical therapy to address impairments.     OBJECTIVE IMPAIRMENTS: Abnormal gait, decreased activity tolerance, decreased balance, decreased coordination, decreased endurance, decreased mobility, difficulty walking, decreased ROM, decreased strength, decreased safety awareness, increased muscle spasms,  impaired UE functional use, postural dysfunction, and pain.   ACTIVITY LIMITATIONS: carrying, lifting, bending, sleeping, transfers, bed mobility, dressing, reach over head, hygiene/grooming, and locomotion level  PARTICIPATION LIMITATIONS: meal prep, cleaning, laundry, and community activity  PERSONAL FACTORS: Fitness and 3+ comorbidities: Anxiety and depression, cerebellar ataxia, Raynaud disease (feet), osteoporosis, emphysema (former smoker)  are also affecting patient's functional outcome.   REHAB POTENTIAL: Good  CLINICAL DECISION MAKING: Evolving/moderate complexity  EVALUATION COMPLEXITY: Moderate  GOALS: Goals reviewed with patient? Yes  SHORT TERM GOALS: Target date: 11/25/2022   Patient will be independent with initial HEP.  Baseline: needs Goal status: IN PROGRESS 11/20/22- reports compliance, cues needed  2.  Patient will have LE strength/functional strength evaluated.   Baseline: not performed today.  Goal status: INITIAL  3.  Patient will complete TUG.  Baseline: not performed today Goal status: INITIAL   LONG TERM GOALS: Target date: 01/06/2023 extended to 02/18/23  Patient will be independent with advanced/ongoing HEP to improve outcomes and carryover.  Baseline:  Goal status: IN PROGRESS  2.  Patient will report 75% improvement in left shoulder pain to improve QOL.  Baseline:  Goal status: IN PROGRESS  3.  Patient to improve Left shoulder AROM  to [facility] without pain provocation to allow for increased ease of ADLs.  Baseline: see objective Goal status: IN PROGRESS- 02/03/23 mild pain with ABD and FIR  4.  Patient will report at least 10% improvement on QuickDash  to demonstrate improved functional ability.  Baseline: 56.8% disability Goal status: IN PROGRESS  6.  Patient will be able to reach behind head and back without increased pain to perform ADLs.     Baseline: unable due to pain Goal status: IN PROGRESS   7. Patient will demonstrate improved  functional LE strength with goal TBD to decrease risk of falls.   Baseline: not assessed Goal status: INITIAL  8. Patient will be able to complete TUG in less than 25 seconds with AD in order to decrease fall risk.   Baseline: NT Goal status: IN PROGRESS   PLAN:  PT FREQUENCY: 1-2x/week  PT DURATION: 8 weeks  PLANNED INTERVENTIONS: Therapeutic exercises, Therapeutic activity, Neuromuscular re-education, Balance training, Gait training, Patient/Family education, Self Care, Joint mobilization, Dry Needling, Electrical stimulation, Spinal mobilization, Cryotherapy, Moist heat, Taping, Ultrasound, Ionotophoresis '4mg'$ /ml Dexamethasone, Manual therapy, and Re-evaluation  PLAN FOR NEXT SESSION: continue to progress L shoulder strength and ROM as tolerated,  modalities/manual therapy for L shoulder PRN, will assess LE/TUG/5xSTS, mCTSIB at later date.    Artist Pais, PTA 02/03/2023, 2:02 PM

## 2023-02-04 NOTE — Therapy (Signed)
OUTPATIENT PHYSICAL THERAPY TREATMENT  PROGRESS NOTE  Dates of Reporting Period: 01/06/23 to 02/05/23    Patient Name: Sherry Zimmerman MRN: [id number] DOB:15-Aug-1956, 67 y.o., female Today's Date: 02/05/2023  END OF SESSION:  PT End of Session - 02/05/23 1352     Visit Number 10    Number of Visits 12    Date for PT Re-Evaluation 02/18/23    Authorization Type [employer] Medicare    Progress Note Due on Visit 20    PT Start Time 1348    PT Stop Time 1435    PT Time Calculation (min) 47 min    Activity Tolerance Patient tolerated treatment well    Behavior During Therapy WFL for tasks assessed/performed                 Past Medical History:  Diagnosis Date   Anxiety    AR (allergic rhinitis)    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   Osteoporosis 07/08/2021   DEXA 03/2021: lowest T = -3.3, L femur; forearm -2.2; started tx 07/2021   Raynaud disease 05/13/2018   feet   Splenic rupture 10/26/14   Past Surgical History:  Procedure Laterality Date   ABDOMINAL HYSTERECTOMY     CATARACT EXTRACTION Bilateral 01/17/2020   SPLENECTOMY, TOTAL N/A 10/26/2014   Procedure: SPLENECTOMY;  Surgeon: Georganna Skeans, MD;  Location: [facility] OR;  Service: General;  Laterality: N/A;   WRIST FRACTURE SURGERY Right    Patient Active Problem List   Diagnosis Date Noted   Osteoporosis 07/08/2021   Former smoker 07/08/2021   On statin therapy due to risk of future cardiovascular event 11/18/2019   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   Migraine without aura and without status migrainosus, not intractable 01/10/2019   Major depression, recurrent, chronic ([place]) 08/13/2018   Acquired iron  deficiency anemia due to decreased absorption 06/18/2018   Raynaud disease 05/13/2018   Emphysema of lung ([place]) 05/13/2018   AR (allergic rhinitis) 01/08/2018   OAB (overactive bladder) 11/09/2014   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 Able to do her hair and don her bra.    Left hand dominant.   PERTINENT HISTORY: Steroid injection L shoulder 10/20/22 for possible partial tear L RTC  Anxiety and depression, cerebellar ataxia, Raynaud disease (feet), osteoporosis, emphysema (former smoker)  PAIN:  Are you having pain? Yes: NPRS scale: 0/10 Pain location: L shoulder  Pain description: hurt all the time, now better, just tender Aggravating factors: lifting - can't lift > 16 oz due to pain,  Relieving factors: motrin, steroid shot  PRECAUTIONS: Fall  WEIGHT BEARING RESTRICTIONS: No  FALLS:  Has patient fallen in last 6 months? Yes. Number of falls at least 15, falls almost daily  LIVING ENVIRONMENT: Lives with: lives with their spouse Lives in: House/apartment Stairs: Yes: External: 1 steps; on right going up Has following equipment at home: Environmental consultant - 4 wheeled, Shower bench, Grab bars, and 3 wheeled walker  OCCUPATION: Permanent disability  PLOF: Independent  with household mobility with device  PATIENT GOALS: improve shoulder pain  NEXT MD VISIT:  no follow-up scheduled  OBJECTIVE:   DIAGNOSTIC FINDINGS:  10/10/22 DG Shoulder left FINDINGS: There is no evidence of fracture or dislocation. There is no evidence of arthropathy or other focal bone abnormality. Soft tissues are unremarkable.  PATIENT SURVEYS:  Quick Dash 56.8% impairment ;  02/05/23 40.9 / 100 = 40.9 % impairment  COGNITION: Overall cognitive status: Within functional limits for tasks assessed  OBSERVATION: Patient enters with 4WRW, bil genu valugum, knee hyperextension in stance phase, accompanied by fiance.  Decreased muscle bulk noted throughout, no apparent distress, however winces with L arm movements, and poor motor control noted raising arm.      SENSATION: Light touch: WFL  POSTURE: Sacral sits, rounded shoulders, forward head.    UPPER EXTREMITY ROM:   Active ROM Right eval Left eval Left A/PROM 01/06/23 Left 02/03/23  Shoulder flexion 135 90* 110/120 * 140  Shoulder extension 90 50* NT*   Shoulder abduction 130 90* 94/119* 135- mild pain  Shoulder adduction      Shoulder internal rotation To T6 Unable due to pain Functionally thumb to PSIS 38/52* FIR to L4-L5  Shoulder external rotation To Right scapula Several tries need to bring to L shoulder, limited ER * Able to reach behind head 68/68* FER to T1  (Blank rows = not tested) *increased pain  UPPER EXTREMITY MMT:  MMT Right eval Left eval Left 01/06/23  Shoulder flexion 5 4+* 4+*  Shoulder extension   Releases due to pain  Shoulder abduction 5 3* 5*  Shoulder internal rotation 5 4+* 4+  Shoulder external rotation 5 4+* 4+*  Elbow flexion 5 4+* 4+  Elbow extension 5 5   Wrist flexion 5 5   Wrist extension 5 5   Grip strength  good good   (Blank rows = not tested)  *pain with resistance  SHOULDER SPECIAL TESTS: Impingement tests: Painful arc test: positive   JOINT MOBILITY TESTING:   NT  PALPATION:  Increased pain over L corocoid process, biceps and RTC insertion  LOWER EXTREMITY MMT:  02/05/23  MMT Right eval Left eval  Hip flexion 5 4+  Hip extension 4+ 4+  Hip abduction 4+ 4+  Hip adduction 4+ 4+  Knee flexion 5 5  Knee extension 4+ 4+  Ankle dorsiflexion 5 5   (Blank rows = not tested)  LE ROM:  WNL B with normal muscle flexibility  02/05/23  TUG: 22.3 with Rollator   TODAY'S TREATMENT:                                                                                                                                         DATE:   02/05/23 Therapeutic Exercise: to improve strength and mobility.  Demo, verbal and tactile cues throughout for technique. UBE L1.0 fwd only today Sit to stand x 10 Bridge x 10 Bridge with ABD GTB x 10 Supine hip ABD x 3 ea leg; also did in standing at ledge x 3 (pt given either/or for HEP) Seated hip ADD squeeze into pillow x 5 with 5 sec hold Church pew x 10 in front of mat table Worked on balance with weight shifts ant and post at wall to encourage hip strategy. Standing with toes on incline (blue balance beam) - worked on challenging balance with change in COG and perturbations Standing balance with perturbations level ground  Therapeutic Activities: Goals assessed for progress note; LE assessed, Katina Dung    02/03/23 Therapeutic Exercise: to improve strength and mobility.  Demo, verbal and tactile cues throughout for technique. UBE L1.0 3 min fwd/3 min back Wall slides flexion x 10  Wall slides abduction x 10  Seated ER with RTB 2x10 Seated horiz ABD RTB 2x10 Seated row RTB 2x15 Seated IR AAROM with towel behind back x 10 Church pew x 10 Retro step x 10 - cues to avoid hyperextending knee Standing calf raise x 10  01/06/2023 Therapeutic Exercise: to improve strength and mobility.  Demo, verbal and tactile cues throughout for technique. Nustep L 6 x 6 min Standing flex to 2nd shelf with 1# weight x 8 Seated  flex/scap/abd x 10 in pain free range (needs cues to avoid going into pain) Supine serratus push 2# x 20  Manual Therapy: to decrease muscle spasm and pain and improve mobility STM to left UT IS, rhomboids and lats, skilled palpation and monitoring during dry needling. Trigger Point Dry-Needling  Treatment instructions: Expect mild to moderate muscle soreness. S/S of pneumothorax if dry needled over a lung field, and to seek immediate medical attention should they occur. Patient verbalized understanding of these instructions and education. Patient Consent Given: Yes Education handout provided: Previously provided Muscles treated: L subscapularis, lats, infraspinatus and UT Electrical stimulation performed: No Parameters: N/A Treatment response/outcome: Twitch Response Elicited and Palpable Increase in Muscle Length  PATIENT EDUCATION: Education details:HEP update Person educated: Patient Education method: Explanation, Demonstration, Verbal cues, and Handouts Education comprehension: verbalized understanding and returned demonstration  HOME EXERCISE PROGRAM: Access Code: [email or web address] URL: [email or web address] Date: 02/05/2023 Prepared by: [place] with Hip Abduction and Resistance - Ground Touches  - 1 x daily - 2 x weekly - 3 sets - 10 reps - Sit to Stand  - 2 x daily - 7 x weekly - 1 sets - 10 reps - Supine Hip  Abduction  - 1 x daily - 2 x weekly - 3 sets - 10 reps - Seated Hip Adduction Isometrics with Ball  - 1 x daily - 2 x weekly - 3 sets - 10 reps - 5 sec  hold - Standing Hip Abduction with Counter Support  - 1 x daily - 2 x weekly - 1-3 sets - 10 reps  ASSESSMENT:  CLINICAL IMPRESSION: Samiksha Vario is making functional improvements in her L shoulder as demonstrated by her ability to fix her hair and don/doff her bra independently. Additionally she can raise her arm OH. However, she continues to have pain with resistance in OH activities and  still is unable to reach behind her back without pain or functional ROM. Overall she reports 40% improvement in her shoulder. Her LE was assessed today and she demonstrates fairly good strength, but she gives out with MMT. She also demonstrates good core strength with MMT and perturbations. HEP was progressed with LE strengthening exercises. We also worked on balance today to help prevent falls. She would like to work on being able to squat without falling to the ground, so new goal was set to address this. Cherysh reports a fall earlier this week from the commode and required assistance to get up. She denies injury. Kyler continues to demonstrate potential for improvement and would benefit from continued skilled therapy to address impairments.    OBJECTIVE IMPAIRMENTS: Abnormal gait, decreased activity tolerance, decreased balance, decreased coordination, decreased endurance, decreased mobility, difficulty walking, decreased ROM, decreased strength, decreased safety awareness, increased muscle spasms, impaired UE functional use, postural dysfunction, and pain.   ACTIVITY LIMITATIONS: carrying, lifting, bending, sleeping, transfers, bed mobility, dressing, reach over head, hygiene/grooming, and locomotion level  PARTICIPATION LIMITATIONS: meal prep, cleaning, laundry, and community activity  PERSONAL FACTORS: Fitness and 3+ comorbidities: Anxiety and depression, cerebellar ataxia, Raynaud disease (feet), osteoporosis, emphysema (former smoker)  are also affecting patient's functional outcome.   REHAB POTENTIAL: Good  CLINICAL DECISION MAKING: Evolving/moderate complexity  EVALUATION COMPLEXITY: Moderate  GOALS: Goals reviewed with patient? Yes  SHORT TERM GOALS: Target date: 11/25/2022   Patient will be independent with initial HEP.  Baseline: needs Goal status: IN PROGRESS 11/20/22- reports compliance, cues needed  2.  Patient will have LE strength/functional strength evaluated.   Baseline:  not performed today.  Goal status: PARTIALLY MET 02/05/23   3.  Patient will complete TUG.  Baseline: not performed today Goal status: MET 02/05/23   LONG TERM GOALS: Target date: 01/06/2023 extended to 02/18/23  Patient will be independent with advanced/ongoing HEP to improve outcomes and carryover.  Baseline:  Goal status: IN PROGRESS  2.  Patient will report 75% improvement in left shoulder pain to improve QOL.  Baseline: 02/05/23 40% improvement Goal status: IN PROGRESS  3.  Patient to improve Left shoulder AROM to [facility] without pain provocation to allow for increased ease of ADLs.  Baseline: see objective Goal status: IN PROGRESS- 02/03/23 mild pain with ABD and FIR; 02/05/23 unable to perform flexion with resistance without pain  4.  Patient will report at least 10% improvement on QuickDash  to demonstrate improved functional ability.  Baseline: 56.8% disability at eval; 02/05/23 40.9 / 100 = 40.9 % Goal status: MET  6.  Patient will be able to reach behind head and back without increased pain to perform ADLs.     Baseline: unable due to pain; 02/05/23 limited to L4/5 with pain with IR; MET ER - able to fix hair Goal status:  IN PROGRESS   7. Patient will demonstrate improved functional LE strength with goal TBD to decrease risk of falls.   Baseline: not assessed Goal status: INITIAL  8. Patient will be able to complete TUG in less than 25 seconds with AD in order to decrease fall risk.   Baseline: 02/05/23 22 seconds Goal status: MET  9.  Patient will be able to squat safely to ground or low stool to be able to garden and perform other functional activities. Baseline: TBD Goal status: INITIAL    PLAN:  PT FREQUENCY: 1-2x/week  PT DURATION: 8 weeks  PLANNED INTERVENTIONS: Therapeutic exercises, Therapeutic activity, Neuromuscular re-education, Balance training, Gait training, Patient/Family education, Self Care, Joint mobilization, Dry Needling, Electrical stimulation,  Spinal mobilization, Cryotherapy, Moist heat, Taping, Ultrasound, Ionotophoresis '4mg'$ /ml Dexamethasone, Manual therapy, and Re-evaluation  PLAN FOR NEXT SESSION: continue to progress L shoulder strength and ROM as tolerated,  modalities/manual therapy for L shoulder PRN, Assess ability to squat to floor (use mats on floor for safety) for goal #9 and 5xSTS for goal #7 and complete goal info.    Jourdyn Hasler, PT 02/05/2023, 3:31 PM

## 2023-02-05 ENCOUNTER — Ambulatory Visit: Payer: Medicare Other | Admitting: Physical Therapy

## 2023-02-05 ENCOUNTER — Encounter: Payer: Self-pay | Admitting: Physical Therapy

## 2023-02-05 DIAGNOSIS — M25512 Pain in left shoulder: Secondary | ICD-10-CM | POA: Diagnosis not present

## 2023-02-05 DIAGNOSIS — M6281 Muscle weakness (generalized): Secondary | ICD-10-CM | POA: Diagnosis not present

## 2023-02-05 DIAGNOSIS — R296 Repeated falls: Secondary | ICD-10-CM | POA: Diagnosis not present

## 2023-02-05 DIAGNOSIS — M25612 Stiffness of left shoulder, not elsewhere classified: Secondary | ICD-10-CM

## 2023-02-10 ENCOUNTER — Ambulatory Visit: Payer: Medicare Other

## 2023-02-12 ENCOUNTER — Ambulatory Visit: Payer: Medicare Other | Admitting: Physical Therapy

## 2023-02-17 ENCOUNTER — Encounter: Payer: Self-pay | Admitting: Physical Therapy

## 2023-02-17 ENCOUNTER — Ambulatory Visit: Payer: Medicare Other | Attending: Family Medicine | Admitting: Physical Therapy

## 2023-02-17 DIAGNOSIS — M25612 Stiffness of left shoulder, not elsewhere classified: Secondary | ICD-10-CM

## 2023-02-17 DIAGNOSIS — M25512 Pain in left shoulder: Secondary | ICD-10-CM | POA: Diagnosis not present

## 2023-02-17 DIAGNOSIS — M6281 Muscle weakness (generalized): Secondary | ICD-10-CM | POA: Diagnosis not present

## 2023-02-17 DIAGNOSIS — R296 Repeated falls: Secondary | ICD-10-CM | POA: Diagnosis not present

## 2023-02-17 NOTE — Therapy (Signed)
OUTPATIENT PHYSICAL THERAPY TREATMENT PHYSICAL THERAPY DISCHARGE SUMMARY  Visits from Start of Care: 11  Current functional level related to goals / functional outcomes: Decreased L shoulder pain, improved ADLs, QuickDash improved from 56.8% to 40.9% impairment.    Remaining deficits: Impaired gait and balance, falls, L shoulder pain with overhead use   Education / Equipment: HEP, theraband  Plan: Patient agrees to discharge.  Patient is being discharged due to meeting the stated rehab goals and by request.  She is independent with HEP and will continue to work on strengthening at home.        Patient Name: Sherry Zimmerman MRN: [id number] DOB:1956/12/03, 67 y.o., female Today's Date: 02/17/2023  END OF SESSION:  PT End of Session - 02/17/23 1317     Visit Number 11    Number of Visits 12    Date for PT Re-Evaluation 02/18/23    Authorization Type [employer] Medicare    Progress Note Due on Visit 20    PT Start Time 1317    PT Stop Time 1402    PT Time Calculation (min) 45 min    Activity Tolerance Patient tolerated treatment well    Behavior During Therapy WFL for tasks assessed/performed                 Past Medical History:  Diagnosis Date   Anxiety    AR (allergic rhinitis)    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   Osteoporosis 07/08/2021   DEXA 03/2021: lowest T = -3.3, L femur; forearm -2.2; started tx 07/2021   Raynaud disease 05/13/2018   feet   Splenic rupture 10/26/14   Past Surgical History:  Procedure Laterality Date   ABDOMINAL HYSTERECTOMY     CATARACT EXTRACTION Bilateral 01/17/2020   SPLENECTOMY, TOTAL N/A 10/26/2014   Procedure: SPLENECTOMY;   Surgeon: Georganna Skeans, MD;  Location: [facility] OR;  Service: General;  Laterality: N/A;   WRIST FRACTURE SURGERY Right    Patient Active Problem List   Diagnosis Date Noted   Osteoporosis 07/08/2021   Former smoker 07/08/2021   On statin therapy due to risk of future cardiovascular event 11/18/2019   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   Migraine without aura and without status migrainosus, not intractable 01/10/2019   Major depression, recurrent, chronic ([place]) 08/13/2018   Acquired iron deficiency anemia due to decreased absorption 06/18/2018   Raynaud disease 05/13/2018   Emphysema of lung ([place]) 05/13/2018   AR (allergic rhinitis) 01/08/2018   OAB (overactive bladder) 11/09/2014   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  Feels her shoulder is about as good as its going to get, but it is a lot better than it was.   She's been doing exercises at home and tries to work on balance as well.  Ready to take a break from therapy.      Left hand dominant.   PERTINENT HISTORY: Steroid injection L shoulder 10/20/22 for possible partial tear L RTC  Anxiety and depression, cerebellar ataxia, Raynaud disease (feet), osteoporosis, emphysema (former smoker)  PAIN:  Are  you having pain? Yes: NPRS scale: 0/10 Pain location: L shoulder  Pain description: hurt all the time, now better, just tender Aggravating factors: lifting - can't lift > 16 oz due to pain,  Relieving factors: motrin, steroid shot  PRECAUTIONS: Fall  WEIGHT BEARING RESTRICTIONS: No  FALLS:  Has patient fallen in last 6 months? Yes. Number of falls at least 15, falls almost daily  LIVING ENVIRONMENT: Lives with: lives with their spouse Lives in: House/apartment Stairs: Yes: External: 1 steps; on right going up Has following equipment at home: Environmental consultant - 4 wheeled, Shower bench, Grab bars, and 3 wheeled walker  OCCUPATION: Permanent disability  PLOF: Independent with household mobility with device  PATIENT GOALS: improve shoulder pain  NEXT MD VISIT:  no follow-up scheduled  OBJECTIVE:   DIAGNOSTIC FINDINGS:  10/10/22 DG Shoulder left FINDINGS: There is no evidence of fracture or dislocation. There is no evidence of arthropathy or other focal bone abnormality. Soft tissues are unremarkable.  PATIENT SURVEYS:  Quick Dash 56.8% impairment ;  02/05/23 40.9 / 100 = 40.9 % impairment  COGNITION: Overall cognitive status: Within functional limits for tasks assessed  OBSERVATION: Patient enters with 4WRW, bil genu valugum, knee hyperextension in stance phase, accompanied by fiance.  Decreased muscle bulk noted throughout, no apparent distress, however winces with L arm movements, and poor motor control noted raising arm.      SENSATION: Light touch: WFL  POSTURE: Sacral sits, rounded shoulders, forward head.    UPPER EXTREMITY ROM:   Active ROM Right eval Left eval Left A/PROM 01/06/23 Left 02/03/23  Shoulder flexion 135 90* 110/120 * 140  Shoulder extension 90 50* NT*   Shoulder abduction 130 90* 94/119* 135- mild pain  Shoulder adduction      Shoulder internal rotation To T6 Unable due to pain Functionally thumb to PSIS 38/52* FIR to L4-L5  Shoulder external rotation  To Right scapula Several tries need to bring to L shoulder, limited ER * Able to reach behind head 68/68* FER to T1  (Blank rows = not tested) *increased pain  UPPER EXTREMITY MMT:  MMT Right eval Left eval Left 01/06/23  Shoulder flexion 5 4+* 4+*  Shoulder extension   Releases due to pain  Shoulder abduction 5 3* 5*  Shoulder internal rotation 5 4+* 4+  Shoulder external rotation 5 4+* 4+*  Elbow flexion 5 4+* 4+  Elbow extension 5 5   Wrist flexion 5 5   Wrist extension 5 5   Grip strength  good good   (Blank rows = not tested)  *pain with resistance  SHOULDER SPECIAL TESTS: Impingement tests: Painful arc test: positive   JOINT MOBILITY TESTING:  NT  PALPATION:  Increased pain over L corocoid process, biceps and RTC insertion  LOWER EXTREMITY MMT:  02/05/23  MMT Right eval Left eval  Hip flexion 5 4+  Hip extension  4+ 4+  Hip abduction 4+ 4+  Hip adduction 4+ 4+  Knee flexion 5 5  Knee extension 4+ 4+  Ankle dorsiflexion 5 5   (Blank rows = not tested)  LE ROM:  WNL B with normal muscle flexibility  02/05/23  TUG: 22.3 with Rollator   TODAY'S TREATMENT:                                                                                                                                         DATE:   02/17/2023 Therapeutic Exercise: to improve strength and mobility.  Demo, verbal and tactile cues throughout for technique. Nustep L4 x 8 min  Review of HEP Seated march with RTB 2 x 10 Seated hip abduction RTB 2 x 10 Seated LAQ RTB 2 x 10 bil  Manual Therapy: to decrease muscle spasm and pain and improve mobility STM to bil UT, LS, skilled palpation and monitoring during dry needling. Trigger Point Dry-Needling  Treatment instructions: Expect mild to moderate muscle soreness. S/S of pneumothorax if dry needled over a lung field, and to seek immediate medical attention should they occur. Patient verbalized understanding of these instructions and education. Patient  Consent Given: Yes Education handout provided: Previously provided Muscles treated: L UT, L supraspinatus, L lateral deltoid Electrical stimulation performed: No Parameters: N/A Treatment response/outcome: Twitch Response Elicited and Palpable Increase in Muscle Length  02/05/23 Therapeutic Exercise: to improve strength and mobility.  Demo, verbal and tactile cues throughout for technique. UBE L1.0 fwd only today Sit to stand x 10 Bridge x 10 Bridge with ABD GTB x 10 Supine hip ABD x 3 ea leg; also did in standing at ledge x 3 (pt given either/or for HEP) Seated hip ADD squeeze into pillow x 5 with 5 sec hold Church pew x 10 in front of mat table Worked on balance with weight shifts ant and post at wall to encourage hip strategy. Standing with toes on incline (blue balance beam) - worked on challenging balance with change in COG and perturbations Standing balance with perturbations level ground  Therapeutic Activities: Goals assessed for progress note; LE assessed, Katina Dung  02/03/23 Therapeutic Exercise: to improve strength and mobility.  Demo, verbal and tactile cues throughout for technique. UBE L1.0 3 min fwd/3 min back Wall slides flexion x 10  Wall slides abduction x 10  Seated ER with RTB 2x10 Seated horiz ABD RTB 2x10 Seated row RTB 2x15 Seated IR AAROM with towel behind back x 10 Church pew x 10 Retro step x 10 - cues to avoid hyperextending knee Standing calf raise x 10  01/06/2023 Therapeutic Exercise: to improve strength and mobility.  Demo, verbal and tactile cues throughout for technique. Nustep L 6 x 6 min Standing flex to 2nd shelf with 1# weight x 8 Seated flex/scap/abd x 10 in pain free range (needs cues to avoid going into pain) Supine serratus  push 2# x 20  Manual Therapy: to decrease muscle spasm and pain and improve mobility STM to left UT IS, rhomboids and lats, skilled palpation and monitoring during dry needling. Trigger Point Dry-Needling  Treatment  instructions: Expect mild to moderate muscle soreness. S/S of pneumothorax if dry needled over a lung field, and to seek immediate medical attention should they occur. Patient verbalized understanding of these instructions and education. Patient Consent Given: Yes Education handout provided: Previously provided Muscles treated: L subscapularis, lats, infraspinatus and UT Electrical stimulation performed: No Parameters: N/A Treatment response/outcome: Twitch Response Elicited and Palpable Increase in Muscle Length  PATIENT EDUCATION: Education details:HEP update Person educated: Patient Education method: Explanation, Demonstration, Verbal cues, and Handouts Education comprehension: verbalized understanding and returned demonstration  HOME EXERCISE PROGRAM: Access Code: [email or web address] URL: [email or web address] Date: 02/17/2023 Prepared by: Glenetta Hew  Exercises - Seated Shoulder Rolls  - 1 x daily - 7 x weekly - 2 sets - 10 reps - Seated Isometric Shoulder Internal Rotation with Towel  - 1 x daily - 7 x weekly - 2 sets - 5 reps - 5-10 sec hold - Seated Isometric Shoulder External Rotation  - 1 x daily - 7 x weekly - 2 sets - 5 reps - 5-10 sec hold - Seated Isometric Shoulder Flexion  - 1 x daily - 7 x weekly - 2 sets - 5 reps - 5-10 sec hold - Seated Isometric Elbow Extension  - 1 x daily - 7 x weekly - 2 sets - 5 reps - 5 - 10 sec  hold - Seated Scapular Retraction  - 1 x daily - 7 x weekly - 2 sets - 5 reps - 5-10 sec  hold - Putty Squeezes  - 1 x daily - 7 x weekly - 3 sets - 10 reps - Seated Shoulder Row with Anchored Resistance  - 1 x daily - 7 x weekly - 2 sets - 10 reps - Seated Shoulder Internal Rotation with Anchored Resistance  - 1 x daily - 7 x weekly - 2 sets - 10 reps - Seated Bilateral Shoulder External Rotation with Resistance  - 1 x daily - 7 x weekly - 2 sets - 10 reps - Supine Shoulder Press with Dowel  - 1 x daily - 7 x weekly - 2 sets - 10 reps -  Supine Shoulder Flexion AAROM with Hands Clasped  - 1 x daily - 7 x weekly - 2 sets - 10 reps - Seated Shoulder Extension and Scapular Retraction with Resistance  - 1 x daily - 7 x weekly - 2 sets - 10 reps - Seated Shoulder Horizontal Abduction with Resistance - Palms Down  - 1 x daily - 7 x weekly - 2 sets - 10 reps - Bridge with Hip Abduction and Resistance - Ground Touches  - 1 x daily - 2 x weekly - 3 sets - 10 reps - Sit to Stand  - 2 x daily - 7 x weekly - 1 sets - 10 reps - Supine Hip Abduction  - 1 x daily - 2 x weekly - 3 sets - 10 reps - Seated Hip Adduction Isometrics with Ball  - 1 x daily - 2 x weekly - 3 sets - 10 reps - 5 sec  hold - Standing Hip Abduction with Counter Support  - 1 x daily - 2 x weekly - 1-3 sets - 10 reps - Seated Hip Abduction with Resistance  - 1 x daily - 7 x weekly -  2-3 sets - 10 reps - Seated Knee Extension with Resistance  - 1 x daily - 7 x weekly - 2-3 sets - 10 reps - Seated March with Resistance  - 1 x daily - 7 x weekly - 2-3 sets - 10 reps  ASSESSMENT:  CLINICAL IMPRESSION: Doyla Smoak reports that she feels she has functionally made as much progress as likely to with L shoulder, she no longer has pain at rest and only has pain with reaching overhead.  She has been working on balance at home with progressed HEP and feels that she can do exercises independently.   Today we reviewed and progressed her exercises, issued RTB tied to use around legs to do seated exercises, and explained could use with supine exercises as well.  Also discussed floor bike/pedaler as another way to maintain LE strength safely.  She requested TrDN to L shoulder again as she did feel it was beneficial, with good twitch response today.  Discharged to HEP by request.     OBJECTIVE IMPAIRMENTS: Abnormal gait, decreased activity tolerance, decreased balance, decreased coordination, decreased endurance, decreased mobility, difficulty walking, decreased ROM, decreased strength,  decreased safety awareness, increased muscle spasms, impaired UE functional use, postural dysfunction, and pain.   ACTIVITY LIMITATIONS: carrying, lifting, bending, sleeping, transfers, bed mobility, dressing, reach over head, hygiene/grooming, and locomotion level  PARTICIPATION LIMITATIONS: meal prep, cleaning, laundry, and community activity  PERSONAL FACTORS: Fitness and 3+ comorbidities: Anxiety and depression, cerebellar ataxia, Raynaud disease (feet), osteoporosis, emphysema (former smoker)  are also affecting patient's functional outcome.   REHAB POTENTIAL: Good  CLINICAL DECISION MAKING: Evolving/moderate complexity  EVALUATION COMPLEXITY: Moderate  GOALS: Goals reviewed with patient? Yes  SHORT TERM GOALS: Target date: 11/25/2022   Patient will be independent with initial HEP.  Baseline: needs Goal status: IN PROGRESS 11/20/22- reports compliance, cues needed  2.  Patient will have LE strength/functional strength evaluated.   Baseline: not performed today.  Goal status: PARTIALLY MET 02/05/23   3.  Patient will complete TUG.  Baseline: not performed today Goal status: MET 02/05/23   LONG TERM GOALS: Target date: 01/06/2023 extended to 02/18/23  Patient will be independent with advanced/ongoing HEP to improve outcomes and carryover.  Baseline:  Goal status: IN PROGRESS  2.  Patient will report 75% improvement in left shoulder pain to improve QOL.  Baseline: 02/05/23 40% improvement Goal status: IN PROGRESS  3.  Patient to improve Left shoulder AROM to [facility] without pain provocation to allow for increased ease of ADLs.  Baseline: see objective Goal status: IN PROGRESS- 02/03/23 mild pain with ABD and FIR; 02/05/23 unable to perform flexion with resistance without pain  4.  Patient will report at least 10% improvement on QuickDash  to demonstrate improved functional ability.  Baseline: 56.8% disability at eval; 02/05/23 40.9 / 100 = 40.9 % Goal status: MET  6.  Patient  will be able to reach behind head and back without increased pain to perform ADLs.     Baseline: unable due to pain; 02/05/23 limited to L4/5 with pain with IR; MET ER - able to fix hair Goal status: IN PROGRESS   7. Patient will demonstrate improved functional LE strength with goal TBD to decrease risk of falls.   Baseline: not assessed Goal status:  IN PROGRESS  8. Patient will be able to complete TUG in less than 25 seconds with AD in order to decrease fall risk.   Baseline: 02/05/23 22 seconds Goal status: MET  9.  Patient will be able to squat safely to ground or low stool to be able to garden and perform other functional activities. Baseline: TBD Goal status: IN PROGRESS    PLAN:  PT FREQUENCY: 1-2x/week  PT DURATION: 8 weeks  PLANNED INTERVENTIONS: Therapeutic exercises, Therapeutic activity, Neuromuscular re-education, Balance training, Gait training, Patient/Family education, Self Care, Joint mobilization, Dry Needling, Electrical stimulation, Spinal mobilization, Cryotherapy, Moist heat, Taping, Ultrasound, Ionotophoresis '4mg'$ /ml Dexamethasone, Manual therapy, and Re-evaluation  PLAN FOR NEXT SESSION:discharge to [place], PT, DPT  02/17/2023, 2:05 PM

## 2023-03-13 ENCOUNTER — Emergency Department (HOSPITAL_BASED_OUTPATIENT_CLINIC_OR_DEPARTMENT_OTHER)
Admission: EM | Admit: 2023-03-13 | Discharge: 2023-03-13 | Disposition: A | Discharge status: Home / Self Care | Payer: Medicare Other | Attending: Emergency Medicine | Admitting: Emergency Medicine

## 2023-03-13 ENCOUNTER — Encounter (HOSPITAL_BASED_OUTPATIENT_CLINIC_OR_DEPARTMENT_OTHER): Payer: Self-pay

## 2023-03-13 ENCOUNTER — Other Ambulatory Visit: Payer: Self-pay

## 2023-03-13 ENCOUNTER — Emergency Department (HOSPITAL_BASED_OUTPATIENT_CLINIC_OR_DEPARTMENT_OTHER): Payer: Medicare Other

## 2023-03-13 DIAGNOSIS — L089 Local infection of the skin and subcutaneous tissue, unspecified: Secondary | ICD-10-CM | POA: Diagnosis not present

## 2023-03-13 DIAGNOSIS — Z87891 Personal history of nicotine dependence: Secondary | ICD-10-CM | POA: Insufficient documentation

## 2023-03-13 DIAGNOSIS — L03114 Cellulitis of left upper limb: Secondary | ICD-10-CM | POA: Diagnosis not present

## 2023-03-13 DIAGNOSIS — M79632 Pain in left forearm: Secondary | ICD-10-CM | POA: Diagnosis not present

## 2023-03-13 DIAGNOSIS — M79602 Pain in left arm: Secondary | ICD-10-CM | POA: Diagnosis present

## 2023-03-13 MED ORDER — CEPHALEXIN 500 MG PO CAPS: 500.0000 mg | ORAL_CAPSULE | Freq: Four times a day (QID) | ORAL | 0 refills | Status: AC

## 2023-03-13 MED ORDER — DOXYCYCLINE HYCLATE 100 MG PO CAPS: 100.0000 mg | ORAL_CAPSULE | Freq: Two times a day (BID) | ORAL | 0 refills | Status: AC

## 2023-03-13 NOTE — ED Provider Notes (Signed)
[place] [facility] AT [facility] Provider Note  CSN: [id number] Arrival date & time: 03/13/23 1510  Chief Complaint(s) Fall  HPI Sherry Zimmerman is a 67 y.o. female history of chronic cerebellar ataxia presenting to the emergency department left arm pain.  She reports that she fell Monday, lost balance. Reports similar falls due to chronic ataxia.  Developed a wound to the left arm.  She did not hit her head or neck, has no back pain or chest pain or belly pain or pain in her right arm or lower extremities.  She has been ambulating at baseline.  She presents today because she reports that since then she has developed some redness to her left arm and increased pain as well as some drainage.  She has been putting some antibiotic ointment on the arm.  No fevers or chills.   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 Authorizing Provider  cephALEXin (KEFLEX) 500 MG capsule Take 1 capsule (500 mg total) by mouth 4 (four) times daily for 7 days. 03/13/23 03/20/23 Yes Lonell GrandchildScheving, Dodge Ator L, MD  doxycycline (VIBRAMYCIN) 100 MG capsule Take 1 capsule (100 mg total) by mouth 2 (two) times daily for 7 days. 03/13/23 03/20/23 Yes Lonell GrandchildScheving, Honestii Marton L, MD  albuterol (VENTOLIN HFA) 108 (90 Base) MCG/ACT inhaler INHALE 2 PUFFS BY MOUTH EVERY 4 HOURS AS NEEDED FOR WHEEZING FOR SHORTNESS OF BREATH 08/21/21   Willow OraAndy, Camille L, MD  alendronate (FOSAMAX) 70 MG tablet Take 1 tablet (70 mg total) by mouth every 7 (seven) days. Take with a full glass of water on an empty stomach. Sit upright for 30 minutes 07/18/22   Willow OraAndy, Camille L, MD  atorvastatin (LIPITOR) 10 MG tablet Take 1 tablet (10 mg total) by mouth at bedtime. 07/18/22   Willow OraAndy, Camille L, MD  azithromycin (ZITHROMAX) 250 MG tablet Take 2 tabs today, then 1 tab daily for 4 days 12/23/22   Willow OraAndy, Camille L, MD  Glycopyrrolate-Formoterol (BEVESPI AEROSPHERE) 9-4.8 MCG/ACT AERO Inhale 2 puffs into the lungs 2 (two) times daily. 07/18/22   Willow OraAndy, Camille L, MD  guaiFENesin-codeine 100-10 MG/5ML syrup Take 5 mLs by mouth every 6 (six) hours as needed  for cough. 12/23/22   Willow Ora, MD  oxybutynin (DITROPAN) 5 MG tablet Take 1 tablet (5 mg total) by mouth 2 (two) times daily. 07/18/22   Willow Ora, MD  predniSONE (DELTASONE) 20 MG tablet Take 3 tabs daily for 5 days 12/23/22   Willow Ora, MD  rizatriptan (MAXALT) 10 MG tablet Take 10 mg by mouth as needed for migraine. May repeat in 2 hours if needed    [provider]  valACYclovir (VALTREX) 1000 MG tablet Take 2 tablets (2,000 mg total) by mouth 2 (two) times daily. Once, as needed for cold sores  07/23/22   Willow Ora, MD  venlafaxine XR (EFFEXOR-XR) 150 MG 24 hr capsule TAKE 1 CAPSULE BY MOUTH AT BEDTIME **TAKE  WITH  75MG   CAPSULES  FOR  225MG   TOTAL  DAILY  DOSE 07/18/22   Willow Ora, MD  venlafaxine XR (EFFEXOR-XR) 75 MG 24 hr capsule TAKE 1 CAPSULE BY MOUTH ONCE DAILY **TAKE  WITH  150MG   CAPSULES  FOR  225MG   TOTAL  DAILY  DOSE 07/18/22   Willow Ora, MD                                                                                                                                    Past Surgical History Past Surgical History:  Procedure Laterality Date   ABDOMINAL HYSTERECTOMY     CATARACT EXTRACTION Bilateral 01/17/2020   SPLENECTOMY, TOTAL N/A 10/26/2014   Procedure: SPLENECTOMY;  Surgeon: Violeta Gelinas, MD;  Location: [facility] OR;  Service: General;  Laterality: N/A;   WRIST FRACTURE SURGERY Right    Family History Family History  Problem Relation Age of Onset   Ataxia Mother    Stroke Mother    Heart disease Mother    Diabetes Mother    Cancer Father     Social History Social History   Tobacco Use   Smoking status: Former    Packs/day: 1.00    Years: 50.00    Additional pack years: 0.00    Total pack years: 50.00    Types: Cigarettes   Smokeless tobacco: Never   Tobacco comments:    Quit on easter of 2022  Vaping Use   Vaping Use: Never used  Substance Use Topics   Alcohol use: Yes    Comment: socially   Drug use: No   Allergies Morphine and related, Sulfa antibiotics, and Sulfacetamide sodium  Review of Systems Review of Systems  All other systems reviewed and are negative.   Physical Exam Vital Signs  I have reviewed the triage vital signs BP (!) 142/82 (BP Location: Right Arm)   Pulse 100   Temp 97.7 F (36.5 C) (Oral)   Resp 14   Ht 5\' 8"  (1.727 m)   Wt 59 kg   SpO2 93%   BMI 19.77 kg/m  Physical Exam Vitals and nursing note reviewed.  Constitutional:      General: She is not in acute distress.    Appearance: She is  well-developed.  HENT:     Head: Normocephalic and atraumatic.     Mouth/Throat:     Mouth: Mucous membranes are moist.  Eyes:     Pupils: Pupils are equal, round, and reactive to light.  Cardiovascular:     Rate and Rhythm: Normal rate and regular rhythm.     Heart sounds: No murmur heard. Pulmonary:     Effort: Pulmonary effort is normal. No respiratory distress.     Breath sounds: Normal breath sounds.  Abdominal:     General: Abdomen is flat.     Palpations: Abdomen is soft.     Tenderness: There is no abdominal tenderness.  Musculoskeletal:        General: No tenderness or signs of injury.     Right lower leg: No edema.     Left lower leg: No edema.  Skin:    General: Skin is warm and dry.     Comments: On the left arm, there is approximately 10 x 4 cm wound consistent with old skin tear, with purulent drainage and mild surrounding erythema and warmth.  Mild tenderness.  Neurological:     General: No focal deficit present.     Mental Status: She is alert. Mental status is at baseline.     Comments: Ataxic speech  Psychiatric:        Mood and Affect: Mood normal.        Behavior: Behavior normal.     ED Results and Treatments Labs (all labs ordered are listed, but only abnormal results are displayed) Labs Reviewed - No data to display                                                                                                                        Radiology DG Forearm Left  Result Date: 03/13/2023 CLINICAL DATA:  Fall, forearm pain EXAM: LEFT FOREARM - 2 VIEW COMPARISON:  None Available. FINDINGS: There is no evidence of fracture or other focal bone lesions. Soft tissues are unremarkable. IMPRESSION: Negative. Electronically Signed   By: Charlett NoseKevin  Dover M.D.   On: 03/13/2023 15:47    Pertinent labs & imaging results that were available during my care of the patient were reviewed by me and considered in my medical decision making (see MDM for details).  Medications  Ordered in ED Medications - No data to display  Procedures Procedures  (including critical care time)  Medical Decision Making / ED Course   MDM:  67 year old female presenting to the emergency department with wound.  On exam, patient has wound to the left forearm.  X-ray negative for fracture.  She has normal range of motion at the wrist and elbow without tenderness.  Wound has purulent drainage with surrounding erythema, mild warmth and tenderness.  Presentation concerning for wound infection.  Will prescribe Keflex and doxycycline to cover for strep and staph given purulence.  Advise close follow-up with primary care doctor for wound recheck.  No evidence of necrotizing infection, no fevers to suggest systemic infection. Provided wound care supplies. Will discharge patient to home. All questions answered. Patient comfortable with plan of discharge. Return precautions discussed with patient and specified on the after visit summary.       Imaging Studies ordered: I ordered imaging studies including XR arm On my interpretation imaging demonstrates no fracture I independently visualized and interpreted imaging. I agree with the radiologist interpretation   Medicines ordered and prescription drug management: Meds ordered this encounter  Medications   cephALEXin (KEFLEX) 500 MG capsule    Sig: Take 1 capsule (500 mg total) by mouth 4 (four) times daily for 7 days.    Dispense:  28 capsule    Refill:  0   doxycycline (VIBRAMYCIN) 100 MG capsule    Sig: Take 1 capsule (100 mg total) by mouth 2 (two) times daily for 7 days.    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   Osteoporosis 07/08/2021   DEXA 03/2021: lowest T = -3.3, L femur; forearm -2.2; started tx 07/2021   Raynaud disease 05/13/2018   feet   Splenic rupture 10/26/14      Dispostion: Disposition decision including need for hospitalization was considered, and patient discharged from emergency department.    Final Clinical Impression(s) / ED Diagnoses Final diagnoses:  Wound infection     This chart was dictated using voice recognition software.  Despite best efforts to proofread,  errors can occur which can change the documentation meaning.    Lonell Grandchild, MD 03/13/23 [phone]

## 2023-03-13 NOTE — ED Notes (Signed)
Gave dressing supplies per provider order. Redressed wound before DC

## 2023-03-13 NOTE — ED Triage Notes (Signed)
States had a fall on Monday. Has ataxia, states falls frequently. Fell backwards and hit left arm on the doorframe, denies other injury. Denies hitting head/LOC. Arm wrapped in coban & gauze, states she has a skin tear.

## 2023-03-13 NOTE — Discharge Instructions (Addendum)
We evaluated you for your arm wounds.  Your x-ray was negative for fracture.  We have started you on 2 different antibiotics for your wound to cover for different bacteria.  We have prescribed you 7 days of treatment.  Please follow-up with your primary care doctor for recheck of your wound.  If you develop any new symptoms such as worsening or spreading redness, worsening pain, fevers or chills, increased swelling, or any other concerning symptoms, please return to the emergency department for a recheck of your wound.

## 2023-05-29 ENCOUNTER — Other Ambulatory Visit: Payer: Self-pay | Admitting: Acute Care

## 2023-05-29 DIAGNOSIS — Z122 Encounter for screening for malignant neoplasm of respiratory organs: Secondary | ICD-10-CM

## 2023-05-29 DIAGNOSIS — Z87891 Personal history of nicotine dependence: Secondary | ICD-10-CM

## 2023-06-29 ENCOUNTER — Encounter: Payer: Self-pay | Admitting: Internal Medicine

## 2023-06-29 ENCOUNTER — Other Ambulatory Visit (HOSPITAL_BASED_OUTPATIENT_CLINIC_OR_DEPARTMENT_OTHER): Payer: Self-pay

## 2023-06-29 ENCOUNTER — Ambulatory Visit (INDEPENDENT_AMBULATORY_CARE_PROVIDER_SITE_OTHER): Payer: Medicare Other | Admitting: Internal Medicine

## 2023-06-29 ENCOUNTER — Encounter (HOSPITAL_BASED_OUTPATIENT_CLINIC_OR_DEPARTMENT_OTHER): Payer: Self-pay

## 2023-06-29 ENCOUNTER — Encounter: Payer: Self-pay | Admitting: Hematology & Oncology

## 2023-06-29 ENCOUNTER — Inpatient Hospital Stay (HOSPITAL_BASED_OUTPATIENT_CLINIC_OR_DEPARTMENT_OTHER)
Admission: EM | Admit: 2023-06-29 | Discharge: 2023-07-02 | DRG: 177 | Disposition: A | Discharge status: Home / Self Care | Payer: Medicare Other | Attending: Internal Medicine | Admitting: Internal Medicine

## 2023-06-29 ENCOUNTER — Other Ambulatory Visit: Payer: Self-pay

## 2023-06-29 ENCOUNTER — Emergency Department (HOSPITAL_BASED_OUTPATIENT_CLINIC_OR_DEPARTMENT_OTHER): Payer: Medicare Other

## 2023-06-29 VITALS — BP 144/89 | HR 102 | Temp 98.0°F | Ht 68.0 in

## 2023-06-29 DIAGNOSIS — R9431 Abnormal electrocardiogram [ECG] [EKG]: Secondary | ICD-10-CM | POA: Diagnosis not present

## 2023-06-29 DIAGNOSIS — J9600 Acute respiratory failure, unspecified whether with hypoxia or hypercapnia: Secondary | ICD-10-CM | POA: Diagnosis present

## 2023-06-29 DIAGNOSIS — R0602 Shortness of breath: Secondary | ICD-10-CM | POA: Diagnosis not present

## 2023-06-29 DIAGNOSIS — R5383 Other fatigue: Secondary | ICD-10-CM

## 2023-06-29 DIAGNOSIS — R051 Acute cough: Secondary | ICD-10-CM

## 2023-06-29 DIAGNOSIS — Z79899 Other long term (current) drug therapy: Secondary | ICD-10-CM

## 2023-06-29 DIAGNOSIS — Z9081 Acquired absence of spleen: Secondary | ICD-10-CM

## 2023-06-29 DIAGNOSIS — Z885 Allergy status to narcotic agent status: Secondary | ICD-10-CM | POA: Diagnosis not present

## 2023-06-29 DIAGNOSIS — R531 Weakness: Secondary | ICD-10-CM

## 2023-06-29 DIAGNOSIS — I2510 Atherosclerotic heart disease of native coronary artery without angina pectoris: Secondary | ICD-10-CM | POA: Diagnosis not present

## 2023-06-29 DIAGNOSIS — U071 COVID-19: Principal | ICD-10-CM | POA: Diagnosis present

## 2023-06-29 DIAGNOSIS — J439 Emphysema, unspecified: Secondary | ICD-10-CM | POA: Diagnosis not present

## 2023-06-29 DIAGNOSIS — Z8249 Family history of ischemic heart disease and other diseases of the circulatory system: Secondary | ICD-10-CM | POA: Diagnosis not present

## 2023-06-29 DIAGNOSIS — F17210 Nicotine dependence, cigarettes, uncomplicated: Secondary | ICD-10-CM | POA: Diagnosis not present

## 2023-06-29 DIAGNOSIS — G119 Hereditary ataxia, unspecified: Secondary | ICD-10-CM

## 2023-06-29 DIAGNOSIS — Z882 Allergy status to sulfonamides status: Secondary | ICD-10-CM

## 2023-06-29 DIAGNOSIS — Z833 Family history of diabetes mellitus: Secondary | ICD-10-CM

## 2023-06-29 DIAGNOSIS — R627 Adult failure to thrive: Secondary | ICD-10-CM | POA: Diagnosis not present

## 2023-06-29 DIAGNOSIS — Z7983 Long term (current) use of bisphosphonates: Secondary | ICD-10-CM

## 2023-06-29 DIAGNOSIS — R000 Tachycardia, unspecified: Secondary | ICD-10-CM | POA: Diagnosis not present

## 2023-06-29 DIAGNOSIS — R064 Hyperventilation: Secondary | ICD-10-CM | POA: Diagnosis not present

## 2023-06-29 DIAGNOSIS — M810 Age-related osteoporosis without current pathological fracture: Secondary | ICD-10-CM | POA: Diagnosis present

## 2023-06-29 DIAGNOSIS — Z7951 Long term (current) use of inhaled steroids: Secondary | ICD-10-CM

## 2023-06-29 DIAGNOSIS — J431 Panlobular emphysema: Secondary | ICD-10-CM | POA: Diagnosis not present

## 2023-06-29 DIAGNOSIS — Z8616 Personal history of COVID-19: Secondary | ICD-10-CM | POA: Diagnosis not present

## 2023-06-29 DIAGNOSIS — Z90710 Acquired absence of both cervix and uterus: Secondary | ICD-10-CM

## 2023-06-29 DIAGNOSIS — R918 Other nonspecific abnormal finding of lung field: Secondary | ICD-10-CM | POA: Diagnosis not present

## 2023-06-29 DIAGNOSIS — Z823 Family history of stroke: Secondary | ICD-10-CM

## 2023-06-29 HISTORY — DX: Chronic obstructive pulmonary disease, unspecified: J44.9

## 2023-06-29 LAB — COMPREHENSIVE METABOLIC PANEL
ALT: 27 U/L (ref 0–44)
AST: 33 U/L (ref 15–41)
Albumin: 3.7 g/dL (ref 3.5–5.0)
Alkaline Phosphatase: 100 U/L (ref 38–126)
Anion gap: 10 (ref 5–15)
BUN: 16 mg/dL (ref 8–23)
CO2: 27 mmol/L (ref 22–32)
Calcium: 9.3 mg/dL (ref 8.9–10.3)
Chloride: 103 mmol/L (ref 98–111)
Creatinine, Ser: 0.63 mg/dL (ref 0.44–1.00)
GFR, Estimated: >60 mL/min (ref >60)
Glucose, Bld: 97 mg/dL (ref 70–99)
Potassium: 4.2 mmol/L (ref 3.5–5.1)
Sodium: 140 mmol/L (ref 135–145)
Total Bilirubin: 0.4 mg/dL (ref 0.3–1.2)
Total Protein: 6.9 g/dL (ref 6.5–8.1)

## 2023-06-29 LAB — I-STAT VENOUS BLOOD GAS, ED
Acid-Base Excess: 4 mmol/L — ABNORMAL HIGH (ref 0.0–2.0)
Bicarbonate: 30 mmol/L — ABNORMAL HIGH (ref 20.0–28.0)
Calcium, Ion: 1.21 mmol/L (ref 1.15–1.40)
HCT: 45 % (ref 36.0–46.0)
Hemoglobin: 15.3 g/dL — ABNORMAL HIGH (ref 12.0–15.0)
O2 Saturation: 40 %
Patient temperature: 98.2
Potassium: 4.1 mmol/L (ref 3.5–5.1)
Sodium: 139 mmol/L (ref 135–145)
TCO2: 31 mmol/L (ref 22–32)
pCO2, Ven: 48.9 mmHg (ref 44–60)
pH, Ven: 7.395 (ref 7.25–7.43)
pO2, Ven: 23 mmHg — CL (ref 32–45)

## 2023-06-29 LAB — CBC WITH DIFFERENTIAL/PLATELET
Abs Immature Granulocytes: 0.03 10*3/uL (ref 0.00–0.07)
Basophils Absolute: 0.1 10*3/uL (ref 0.0–0.1)
Basophils Relative: 1 %
Eosinophils Absolute: 0.1 10*3/uL (ref 0.0–0.5)
Eosinophils Relative: 1 %
HCT: 41.4 % (ref 36.0–46.0)
Hemoglobin: 12.8 g/dL (ref 12.0–15.0)
Immature Granulocytes: 0 %
Lymphocytes Relative: 39 %
Lymphs Abs: 3.6 10*3/uL (ref 0.7–4.0)
MCH: 25.9 pg — ABNORMAL LOW (ref 26.0–34.0)
MCHC: 30.9 g/dL (ref 30.0–36.0)
MCV: 83.8 fL (ref 80.0–100.0)
Monocytes Absolute: 0.7 10*3/uL (ref 0.1–1.0)
Monocytes Relative: 7 %
Neutro Abs: 4.9 10*3/uL (ref 1.7–7.7)
Neutrophils Relative %: 52 %
Platelets: 412 10*3/uL — ABNORMAL HIGH (ref 150–400)
RBC: 4.94 MIL/uL (ref 3.87–5.11)
RDW: 15.8 % — ABNORMAL HIGH (ref 11.5–15.5)
WBC: 9.3 10*3/uL (ref 4.0–10.5)
nRBC: 0 % (ref 0.0–0.2)

## 2023-06-29 LAB — MAGNESIUM: Magnesium: 2 mg/dL (ref 1.7–2.4)

## 2023-06-29 LAB — POC COVID19 BINAXNOW: SARS Coronavirus 2 Ag: POSITIVE — AB

## 2023-06-29 LAB — LACTIC ACID, PLASMA
Lactic Acid, Venous: 1.5 mmol/L (ref 0.5–1.9)
Lactic Acid, Venous: 3.1 mmol/L (ref 0.5–1.9)

## 2023-06-29 MED ORDER — VENLAFAXINE HCL ER 75 MG PO CP24
225.0000 mg | ORAL_CAPSULE | Freq: Every day | ORAL | Status: DC
  Administered 2023-06-30 – 2023-07-02 (×3): 225 mg via ORAL
  Filled 2023-06-29 (×3): qty 1

## 2023-06-29 MED ORDER — METHYLPREDNISOLONE SODIUM SUCC 125 MG IJ SOLR
1.0000 mg/kg | Freq: Two times a day (BID) | INTRAMUSCULAR | Status: DC
  Administered 2023-06-29 – 2023-07-02 (×5): 58.75 mg via INTRAVENOUS
  Filled 2023-06-29 (×6): qty 2

## 2023-06-29 MED ORDER — ATORVASTATIN CALCIUM 10 MG PO TABS
10.0000 mg | ORAL_TABLET | Freq: Every day | ORAL | Status: DC
  Administered 2023-06-29 – 2023-07-01 (×3): 10 mg via ORAL
  Filled 2023-06-29 (×3): qty 1

## 2023-06-29 MED ORDER — UMECLIDINIUM BROMIDE 62.5 MCG/ACT IN AEPB
1.0000 | INHALATION_SPRAY | Freq: Every day | RESPIRATORY_TRACT | Status: DC
  Administered 2023-06-30 – 2023-07-02 (×3): 1 via RESPIRATORY_TRACT
  Filled 2023-06-29: qty 7

## 2023-06-29 MED ORDER — ALBUTEROL SULFATE (2.5 MG/3ML) 0.083% IN NEBU: 2.5000 mg | INHALATION_SOLUTION | RESPIRATORY_TRACT | Status: DC | PRN

## 2023-06-29 MED ORDER — IPRATROPIUM-ALBUTEROL 0.5-2.5 (3) MG/3ML IN SOLN
3.0000 mL | Freq: Four times a day (QID) | RESPIRATORY_TRACT | Status: DC
  Administered 2023-06-29: 3 mL via RESPIRATORY_TRACT
  Filled 2023-06-29: qty 3

## 2023-06-29 MED ORDER — ENOXAPARIN SODIUM 40 MG/0.4ML IJ SOSY
40.0000 mg | PREFILLED_SYRINGE | INTRAMUSCULAR | Status: DC
  Administered 2023-06-29 – 2023-07-01 (×3): 40 mg via SUBCUTANEOUS
  Filled 2023-06-29 (×3): qty 0.4

## 2023-06-29 MED ORDER — LACTATED RINGERS IV SOLN: INTRAVENOUS | Status: DC

## 2023-06-29 MED ORDER — SODIUM CHLORIDE 0.9% FLUSH
3.0000 mL | Freq: Two times a day (BID) | INTRAVENOUS | Status: DC
  Administered 2023-06-29 – 2023-07-02 (×6): 3 mL via INTRAVENOUS

## 2023-06-29 MED ORDER — ARFORMOTEROL TARTRATE 15 MCG/2ML IN NEBU
15.0000 ug | INHALATION_SOLUTION | Freq: Two times a day (BID) | RESPIRATORY_TRACT | Status: DC
  Administered 2023-06-29 – 2023-07-02 (×6): 15 ug via RESPIRATORY_TRACT
  Filled 2023-06-29 (×6): qty 2

## 2023-06-29 MED ORDER — ACETAMINOPHEN 650 MG RE SUPP: 650.0000 mg | Freq: Four times a day (QID) | RECTAL | Status: DC | PRN

## 2023-06-29 MED ORDER — ACETAMINOPHEN 325 MG PO TABS
650.0000 mg | ORAL_TABLET | Freq: Four times a day (QID) | ORAL | Status: DC | PRN
  Administered 2023-06-29 – 2023-07-01 (×3): 650 mg via ORAL
  Filled 2023-06-29 (×4): qty 2

## 2023-06-29 MED ORDER — VENLAFAXINE HCL ER 150 MG PO CP24: 150.0000 mg | ORAL_CAPSULE | Freq: Every day | ORAL | Status: DC

## 2023-06-29 MED ORDER — ADULT MULTIVITAMIN W/MINERALS CH
1.0000 | ORAL_TABLET | Freq: Every day | ORAL | Status: DC
  Administered 2023-06-29 – 2023-07-02 (×4): 1 via ORAL
  Filled 2023-06-29 (×4): qty 1

## 2023-06-29 MED ORDER — VENLAFAXINE HCL ER 75 MG PO CP24: 75.0000 mg | ORAL_CAPSULE | Freq: Every day | ORAL | Status: DC

## 2023-06-29 MED ORDER — OXYBUTYNIN CHLORIDE 5 MG PO TABS
5.0000 mg | ORAL_TABLET | Freq: Two times a day (BID) | ORAL | Status: DC
  Administered 2023-06-29 – 2023-07-02 (×6): 5 mg via ORAL
  Filled 2023-06-29 (×6): qty 1

## 2023-06-29 MED ORDER — IPRATROPIUM-ALBUTEROL 0.5-2.5 (3) MG/3ML IN SOLN
3.0000 mL | Freq: Once | RESPIRATORY_TRACT | Status: AC
  Administered 2023-06-29: 3 mL via RESPIRATORY_TRACT
  Filled 2023-06-29: qty 3

## 2023-06-29 MED ORDER — IPRATROPIUM-ALBUTEROL 0.5-2.5 (3) MG/3ML IN SOLN: 3.0000 mL | Freq: Two times a day (BID) | RESPIRATORY_TRACT | Status: DC

## 2023-06-29 MED ORDER — ONDANSETRON HCL 4 MG PO TABS: 4.0000 mg | ORAL_TABLET | Freq: Four times a day (QID) | ORAL | Status: DC | PRN

## 2023-06-29 MED ORDER — ONDANSETRON HCL 4 MG/2ML IJ SOLN: 4.0000 mg | Freq: Four times a day (QID) | INTRAMUSCULAR | Status: DC | PRN

## 2023-06-29 MED ORDER — PREDNISONE 50 MG PO TABS
50.0000 mg | ORAL_TABLET | Freq: Every day | ORAL | Status: DC
  Filled 2023-06-29: qty 1

## 2023-06-29 NOTE — ED Notes (Signed)
One set of cultures was sent to the lab.Marland KitchenMarland Kitchen

## 2023-06-29 NOTE — Plan of Care (Signed)
  Problem: Education: Goal: Knowledge of General Education information will improve Description: Including pain rating scale, medication(s)/side effects and non-pharmacologic comfort measures Outcome: Progressing   Problem: Health Behavior/Discharge Planning: Goal: Ability to manage health-related needs will improve Outcome: Progressing   Problem: Clinical Measurements: Goal: Ability to maintain clinical measurements within normal limits will improve Outcome: Progressing Goal: Respiratory complications will improve Outcome: Progressing   Problem: Coping: Goal: Level of anxiety will decrease Outcome: Progressing   Problem: Safety: Goal: Ability to remain free from injury will improve Outcome: Progressing   Problem: Skin Integrity: Goal: Risk for impaired skin integrity will decrease Outcome: Progressing   Problem: Education: Goal: Knowledge of risk factors and measures for prevention of condition will improve Outcome: Progressing   Problem: Coping: Goal: Psychosocial and spiritual needs will be supported Outcome: Progressing   Problem: Respiratory: Goal: Will maintain a patent airway Outcome: Progressing Goal: Complications related to the disease process, condition or treatment will be avoided or minimized Outcome: Progressing   Problem: Clinical Measurements: Goal: Will remain free from infection Outcome: Not Progressing Note: Admitted with covid    Problem: Activity: Goal: Risk for activity intolerance will decrease Outcome: Not Progressing   Problem: Nutrition: Goal: Adequate nutrition will be maintained Outcome: Not Progressing

## 2023-06-29 NOTE — ED Triage Notes (Signed)
Patient here POV from PCP Office.  Endorses SOB, Cough, Fatigue over 2 Weeks. Seen today and diagnosed with COVID-19. Sent for further assessment. No recent fevers.  NAD Noted during Triage. A&Ox4. GCS 15. BIB Wheelchair.

## 2023-06-29 NOTE — ED Notes (Signed)
Report given to the next RN... 

## 2023-06-29 NOTE — ED Notes (Signed)
Pt bed assignment ready; Called [facility], waiting for transport

## 2023-06-29 NOTE — Care Plan (Signed)
This is a 67 year old lady with history of hyperlipidemia and ataxia who went to see her PCP today with 9-day history of shortness of breath cough and significant weakness.  She was tested positive for COVID but she appeared septic clinically and her ataxia had gotten worse so she was directed to go to the emergency department.  Upon arrival to ED, I was informed by ED provider Marita Kansas, that patient at times is tachycardic and appears to be having dyspnea, although she is not hypoxic but patient is profoundly weak to the point that she is unable to get out of the bed.  Typically she uses walker.  Patient was tested positive for COVID pneumonia.  She has been given DuoNeb in the emergency department.  EDP requested admission mainly secondary to generalized weakness and patient's inability to get out of the bed secondary to COVID infection.  Her boyfriend was at the bedside in ED.  Admission has been accepted under [facility] as observation status.  EDP to remain responsible for all patient care until patient arrives at [facility] or [facility].  [facility] to resume care afterwards.  nursing staff, please call patient placement once patient arrives to the floor so appropriate hospitalist can be paged for full admission.

## 2023-06-29 NOTE — Patient Instructions (Signed)
It was a pleasure seeing you today! Your health and satisfaction are our top priorities.   Glenetta Hew, MD    Next Steps: Go straight to emergency room for now to be evaluated and monitored further for severe COVID.  [x]  Flexible Follow-Up: We recommend a follow up with your primary care, a specialist, or me within 1-2 weeks for ensuring your problem resolves. This allows for progress monitoring and treatment adjustments.   Making the Most of Our Focused (20 minute) Follow Up Appointments:  [x]   Clearly state your top concerns at the beginning of the visit to focus our discussion [x]   If you anticipate you will need more time, please inform the front desk during scheduling - we can book multiple appointments in the same week. [x]   If you have transportation problems- use our convenient video appointments or ask about transportation support. [x]   We can get down to business faster if you use MyChart to update information before the visit and submit non-urgent questions before your visit. Thank you for taking the time to provide details through MyChart.  Let our nurse know and she can import this information into your encounter documents.  Arrival and Wait Times: [x]   Arriving on time ensures that everyone receives prompt attention. [x]   Early morning (8a) and afternoon (1p) appointments tend to have shortest wait times. [x]   Unfortunately, we cannot delay appointments for late arrivals or hold slots during phone calls.  Getting Answers and Following Up  [x]   Simple Questions & Concerns: For quick questions or basic follow-up after your visit, reach [place] at [phone] or MyChart messaging. [x]   Complex Concerns: If your concern is more complex, scheduling an appointment might be best. Discuss this with the staff to find the most suitable option. [x]   Lab & Imaging Results: We'll contact you directly if results are abnormal or you don't use MyChart. Most normal results will be on MyChart  within 2-3 business days, with a review message from Dr. Jon Billings. Haven't heard back in 2 weeks? Need results sooner? Contact us at [phone]. [x]   Referrals: Our referral coordinator will manage specialist referrals. The specialist's office should contact you within 2 weeks to schedule an appointment. Call us if you haven't heard from them after 2 weeks.  Staying Connected  [x]   MyChart: Activate your MyChart for the fastest way to access results and message [place]. See the last page of this paperwork for instructions on how to activate.  Bring to Your Next Appointment  [x]   Medications: Please bring all your medication bottles to your next appointment to ensure we have an accurate record of your prescriptions. [x]   Health Diaries: If you're monitoring any health conditions at home, keeping a diary of your readings can be very helpful for discussions at your next appointment.  Billing  [x]   X-ray & Lab Orders: These are billed by separate companies. Contact the invoicing company directly for questions or concerns. [x]   Visit Charges: Discuss any billing inquiries with our administrative services team.  Your Satisfaction Matters  [x]   Share Your Experience: We strive for your satisfaction! If you have any complaints, or preferably compliments, please let Dr. Jon Billings know directly or contact our Practice Administrators, Edwena Felty or [employer], by asking at the front desk.   Reviewing Your Records  [x]   Review this early draft of your clinical encounter notes below and the final encounter summary tomorrow on MyChart after its been completed.   Acute cough -  POC COVID-19 BinaxNow  Lethargic -     POC COVID-19 BinaxNow  Labored breathing  Tachycardia  Listlessness

## 2023-06-29 NOTE — Hospital Course (Addendum)
  This is a 67 year old lady with history of hyperlipidemia and ataxia who went to see her PCP today with 9-day history of shortness of breath cough and significant weakness.  She was tested positive for COVID but she appeared septic clinically and her ataxia had gotten worse so she was directed to go to the emergency department.  Upon arrival to ED, I was informed by ED provider Marita Kansas, that patient at times is tachycardic and appears to be having dyspnea, although she is not hypoxic but patient is profoundly weak to the point that she is unable to get out of the bed.  Typically she uses walker.  Patient was tested positive for COVID pneumonia.  She has been given DuoNeb in the emergency department.  EDP requested admission mainly secondary to generalized weakness and patient's inability to get out of the bed secondary to COVID infection.  Her boyfriend was at the bedside in ED.  Admission has been accepted under [facility] as observation status.  EDP to remain responsible for all patient care until patient arrives at [facility] or [facility].  [facility] to resume care afterwards.   nursing staff, please call patient placement once patient arrives to the floor so appropriate hospitalist can be paged for full admission.     67 year old female presents today for concern of COVID infection with significant weakness and increased work of breathing.  Was seen at PCP office and referred to the ED.  Patient has looks ill appearing.   CBC is unremarkable without leukocytosis or anemia.  CMP is unremarkable.  VBG without acute concerns.  Without lactic acidosis.  Magnesium 2.0.  Chest x-ray shows small left-sided pleural effusion.  No obvious pneumonia or other acute cardiopulmonary concerns.  Given the significant weakness from patient's baseline and difficulty with ADLs discussed with hospitalist who will accept patient for admission.  Patient and partner at bedside are in agreement with this plan.

## 2023-06-29 NOTE — ED Notes (Signed)
Family advised that patient will be transported to [facility] for admit

## 2023-06-29 NOTE — H&P (Signed)
History and Physical    Patient: Sherry Zimmerman RJJ:[id number] DOB: 1956-01-06 DOA: 06/29/2023 DOS: the patient was seen and examined on 06/29/2023 PCP: Willow Ora, MD  Patient coming from: Home  Chief Complaint:  Chief Complaint  Patient presents with   Cough   HPI:  67 year old woman PMH including COPD, cerebellar ataxia, presented to [facility] care today with 2-week history of fatigue, weight loss, poor appetite, shortness of breath and cough.  SpO2 was 93% on room air per chart and rapid COVID was positive.  She was referred to the emergency department for further evaluation.  She was noted to be ill-appearing and in respiratory distress and was referred for admission.  Review of Systems: Negative for fever, chills, sweats, vision changes, sore throat, rash, new muscle aches, chest pain, dysuria, bleeding, nausea, vomiting.  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   Past Surgical History:  Procedure Laterality Date   ABDOMINAL HYSTERECTOMY     CATARACT EXTRACTION Bilateral 01/17/2020   SPLENECTOMY, TOTAL N/A 10/26/2014   Procedure: SPLENECTOMY;  Surgeon: Violeta Gelinas, MD;  Location: [facility] OR;  Service: General;  Laterality: N/A;   WRIST FRACTURE SURGERY Right    Social History:  reports that she has been smoking cigarettes. She has a 50 pack-year smoking history.  She has never used smokeless tobacco. She reports that she does not currently use alcohol. She reports that she does not use drugs.  Allergies  Allergen Reactions   Morphine And Codeine Other (See Comments)    Reactions not recalled by the patient   Sulfa Antibiotics Nausea And Vomiting and Other (See Comments)    "Made me sick"    Family History  Problem Relation Age of Onset   Ataxia Mother    Stroke Mother    Heart disease Mother    Diabetes Mother    Cancer Father     Prior to Admission medications   Medication Sig Start Date End Date Taking? Authorizing Provider  albuterol (VENTOLIN HFA) 108 (90 Base) MCG/ACT inhaler INHALE 2 PUFFS BY MOUTH EVERY 4 HOURS AS NEEDED FOR WHEEZING FOR SHORTNESS OF BREATH 08/21/21   Willow Ora, MD  alendronate (FOSAMAX) 70 MG tablet Take 1 tablet (70 mg total) by mouth every 7 (seven) days. Take with a full glass of water on an empty stomach. Sit upright for 30 minutes 07/18/22   Willow Ora, MD  atorvastatin (LIPITOR) 10 MG tablet Take 1 tablet (10 mg total) by mouth at bedtime. 07/18/22   Willow Ora, MD  Glycopyrrolate-Formoterol (BEVESPI AEROSPHERE) 9-4.8 MCG/ACT AERO Inhale 2 puffs into the lungs 2 (two) times daily. 07/18/22   Willow Ora, MD  oxybutynin (DITROPAN) 5 MG tablet Take 1 tablet (5 mg total) by mouth 2 (two) times daily. 07/18/22   Willow Ora, MD  rizatriptan (MAXALT)  10 MG tablet Take 10 mg by mouth as needed for migraine. May repeat in 2 hours if needed    [provider]  valACYclovir (VALTREX) 1000 MG tablet Take 2 tablets (2,000 mg total) by mouth 2 (two) times daily. Once, as needed for cold sores 07/23/22   Willow Ora, MD  venlafaxine XR (EFFEXOR-XR) 150 MG 24 hr capsule TAKE 1 CAPSULE BY MOUTH AT BEDTIME **TAKE  WITH  75MG   CAPSULES  FOR  225MG   TOTAL  DAILY  DOSE 07/18/22   Willow Ora, MD  venlafaxine XR (EFFEXOR-XR) 75 MG 24 hr capsule TAKE 1 CAPSULE BY MOUTH ONCE DAILY **TAKE  WITH  150MG    CAPSULES  FOR  225MG   TOTAL  DAILY  DOSE 07/18/22   Willow Ora, MD    Physical Exam: Vitals:   06/29/23 1500 06/29/23 1517 06/29/23 1615 06/29/23 1638  BP: (!) 156/90  (!) 174/83   Pulse: 91  100   Resp: 14  18   Temp:  98 F (36.7 C) 98.1 F (36.7 C)   TempSrc:  Oral Oral   SpO2: 93%  94% 94%  Weight:      Height:       Physical Exam Vitals reviewed.  Constitutional:      General: She is not in acute distress.    Appearance: She is ill-appearing. She is not toxic-appearing.  HENT:     Head: Normocephalic.  Cardiovascular:     Rate and Rhythm: Normal rate and regular rhythm.     Heart sounds: No murmur heard. Pulmonary:     Effort: Pulmonary effort is normal. No respiratory distress.     Breath sounds: No wheezing, rhonchi or rales.  Abdominal:     General: There is no distension.     Palpations: Abdomen is soft.     Tenderness: There is no abdominal tenderness. There is no guarding.  Musculoskeletal:     Right lower leg: No edema.     Left lower leg: No edema.     Comments: Moves all extremities  Neurological:     Mental Status: She is alert.     Comments: Slightly dysarthric at baseline  Psychiatric:        Mood and Affect: Mood normal.        Behavior: Behavior normal.        Thought Content: Thought content normal.     Data Reviewed: AF, BP, HR stable SpO2 91-94 RA CMP stable Lactic acid WNL CBC noted CXR independently reviewed: tiny left pleural effusion EKG independent review SR, old anterior MI  Assessment and Plan: Acute respiratory distress without hypoxia (SpO2 93% on room air) secondary to COVID Failure to thrive Generalized weakness Symptoms anywhere from 9 to 14 days.  Unclear when COVID began.  Chest x-ray nonacute and respiratory status currently appears stable.  Start IV steroids.  No indication for remdesivir.  Beyond the window for Paxlovid. PT consultation Dietitian consultation  COPD Ongoing cigarette smoker half pack per  day COPD appears stable.  Bronchodilators.  Will recommend smoking cessation.  Cerebellar ataxia with chronic dysarthria    Advance Care Planning: Full code  Consults: none   Family Communication: none  Severity of Illness: The appropriate patient status for this patient is OBSERVATION. Observation status is judged to be reasonable and necessary in order to provide the required intensity of service to ensure the patient's safety. The patient's presenting symptoms, physical exam findings, and initial radiographic and laboratory data  in the context of their medical condition is felt to place them at decreased risk for further clinical deterioration. Furthermore, it is anticipated that the patient will be medically stable for discharge from the hospital within 2 midnights of admission.   Author: Brendia Sacks, MD 06/29/2023 6:12 PM  For on call review [email or web address].

## 2023-06-29 NOTE — ED Provider Notes (Signed)
[place] EMERGENCY DEPARTMENT AT [facility] Provider Note   CSN: [id number] Arrival date & time: 06/29/23  1225     History  Chief Complaint  Patient presents with   Cough    Sherry Zimmerman is a 67 y.o. female.  67 year old female presents today for concern of COVID infection with increased work of breathing and significant weakness.  She presents from PCP office.  She was noted to have significant weakness, drop in baseline O2 sats to 93%.  Significant increase in work of breathing.  Patient states symptoms have been ongoing now for 2 weeks.  Boyfriend who is at bedside believes it has been more like 9 days.  At baseline she would ambulate with a walker but within the past week her weakness has progressively worsened to the point she cannot ambulate independently now.  Has had her COVID vaccinations.  The history is provided by the patient. No language interpreter was used.       Home Medications Prior to Admission medications   Medication Sig Start Date End Date Taking? Authorizing Provider  albuterol (VENTOLIN HFA) 108 (90 Base) MCG/ACT inhaler INHALE 2 PUFFS BY MOUTH EVERY 4 HOURS AS NEEDED FOR WHEEZING FOR SHORTNESS OF BREATH 08/21/21   Willow Ora, MD  alendronate (FOSAMAX) 70 MG tablet Take 1 tablet (70 mg total) by mouth every 7 (seven) days. Take with a full glass of water on an empty stomach. Sit upright for 30 minutes 07/18/22   Willow Ora, MD  atorvastatin (LIPITOR) 10 MG tablet Take 1 tablet (10 mg total) by mouth at bedtime. 07/18/22   Willow Ora, MD  Glycopyrrolate-Formoterol (BEVESPI AEROSPHERE) 9-4.8 MCG/ACT AERO Inhale 2 puffs into the lungs 2 (two) times daily. 07/18/22   Willow Ora, MD  oxybutynin (DITROPAN) 5 MG tablet Take 1 tablet (5 mg total) by mouth 2 (two) times daily. 07/18/22   Willow Ora, MD  rizatriptan (MAXALT) 10 MG tablet Take 10 mg by mouth as needed for migraine. May repeat in 2 hours if needed    [provider]  valACYclovir (VALTREX) 1000 MG tablet Take 2 tablets (2,000 mg total) by mouth 2 (two) times daily. Once, as needed for cold sores 07/23/22   Willow Ora, MD  venlafaxine XR (EFFEXOR-XR) 150 MG 24 hr capsule TAKE 1 CAPSULE BY MOUTH AT BEDTIME **TAKE  WITH  75MG   CAPSULES  FOR  225MG   TOTAL  DAILY  DOSE 07/18/22   Willow Ora, MD  venlafaxine XR (EFFEXOR-XR) 75 MG 24 hr capsule TAKE 1 CAPSULE BY MOUTH ONCE DAILY **TAKE  WITH  150MG   CAPSULES  FOR  225MG   TOTAL  DAILY  DOSE 07/18/22   Willow Ora, MD      Allergies    Morphine and codeine, Sulfa antibiotics, and Sulfacetamide sodium    Review of Systems   Review of Systems  Constitutional:  Negative for fever.  Respiratory:  Positive for cough and shortness of breath.   Cardiovascular:  Negative for chest pain.  Gastrointestinal:  Negative for abdominal pain.  Neurological:  Positive for weakness.  All other systems reviewed and are negative.   Physical Exam Updated Vital Signs BP (!) 157/101   Pulse 94   Temp 98.2 F (36.8 C) (Oral)   Resp (!) 25   Ht 5\' 8"  (1.727 m)   Wt 59 kg   SpO2 93%   BMI 19.78 kg/m  Physical Exam Vitals and nursing note reviewed.  Constitutional:      General: She is not in acute distress.    Appearance: Normal appearance. She is ill-appearing.  HENT:     Head: Normocephalic and atraumatic.     Nose: Nose normal.  Eyes:     General: No scleral icterus.    Extraocular Movements: Extraocular movements intact.     Conjunctiva/sclera: Conjunctivae normal.  Cardiovascular:     Rate and Rhythm: Regular rhythm. Tachycardia present.     Heart sounds: Normal heart sounds.  Pulmonary:     Effort: Respiratory distress (Mild to moderate increase in work of breathing.) present.     Breath sounds: No wheezing.  Abdominal:     General: There is no distension.     Palpations: Abdomen is soft.     Tenderness: There is no abdominal tenderness.  Musculoskeletal:        General: Normal  range of motion.     Cervical back: Normal range of motion.  Skin:    General: Skin is warm and dry.  Neurological:     General: No focal deficit present.     Mental Status: She is alert. Mental status is at baseline.     ED Results / Procedures / Treatments   Labs (all labs ordered are listed, but only abnormal results are displayed) Labs Reviewed  BLOOD GAS, VENOUS  CBC WITH DIFFERENTIAL/PLATELET  COMPREHENSIVE METABOLIC PANEL  LACTIC ACID, PLASMA  LACTIC ACID, PLASMA  MAGNESIUM    EKG EKG Interpretation Date/Time:  Monday June 29 2023 12:34:14 EDT Ventricular Rate:  96 PR Interval:  139 QRS Duration:  80 QT Interval:  343 QTC Calculation: 434 R Axis:   77  Text Interpretation: Sinus rhythm Confirmed by Cathren Laine ([id number]) on 06/29/2023 12:59:21 PM  Radiology No results found.  Procedures Procedures    Medications Ordered in ED Medications  ipratropium-albuterol (DUONEB) 0.5-2.5 (3) MG/3ML nebulizer solution 3 mL (has no administration in time range)    ED Course/ Medical Decision Making/ A&P Clinical Course as of 06/29/23 1334  Mon Jun 29, 2023  1240 BP(!): 162/94 [AA]    Clinical Course User Index [AA] Marita Kansas, PA-C                             Medical Decision Making Amount and/or Complexity of Data Reviewed Labs: ordered. Radiology: ordered.  Risk Prescription drug management. Decision regarding hospitalization.   67 year old female presents today for concern of COVID infection with significant weakness and increased work of breathing.  Was seen at PCP office and referred to the ED.  Patient has looks ill appearing.  CBC is unremarkable without leukocytosis or anemia.  CMP is unremarkable.  VBG without acute concerns.  Without lactic acidosis.  Magnesium 2.0.  Chest x-ray shows small left-sided pleural effusion.  No obvious pneumonia or other acute cardiopulmonary concerns.  Given the significant weakness from patient's baseline and  difficulty with ADLs discussed with hospitalist who will accept patient for admission.  Patient and partner at bedside are in agreement with this plan.   Final Clinical Impression(s) / ED Diagnoses Final diagnoses:  COVID-19    Rx / DC Orders ED Discharge Orders     None         Marita Kansas, PA-C 06/29/23 1502    Cathren Laine, MD 06/29/23 [phone]

## 2023-06-29 NOTE — Progress Notes (Signed)
[facility]: [phone]   Routine Medical Office Visit  Patient:  Sherry Zimmerman      Age: 67 y.o.       Sex:  female  Date:   06/29/2023 Patient Care Team: Willow Ora, MD as PCP - General (Family Medicine) Delories Heinz., MD as Referring Physician (Psychiatry) Today's Healthcare Provider: Lula Olszewski, MD   Assessment and Plan:   Possible sepsis, inability to care for self, need for supplemental oxygen.    COVID-19: Severe symptoms including nausea, lack of appetite, cough, labored breathing, and extreme fatigue have rendered her bedridden for over a week. With an oxygen saturation at 93% and a resting heart rate of 102, she exhibits signs of respiratory distress. A positive rapid COVID-19 test confirms the diagnosis. We will immediately refer her to the emergency department for evaluation and possible admission, considering the severity of symptoms and the potential need for supplemental oxygen. Despite being outside the typical 5-day window, we will consider Paxlovid treatment due to the severity of her illness.  Ataxia: She has a chronic condition of ataxia, which may be exacerbated by her current illness. There are no specific changes or acute concerns at this visit, so we will continue with the current management plan.  General Health Maintenance / Followup Plans: We will follow up after the resolution of the acute illness to reassess her overall health status.      [name] was seen today for nausea, no appetite, cough, lethargic and labored breathing.  Acute cough -     POC COVID-19 BinaxNow  Lethargic -     POC COVID-19 BinaxNow  Labored breathing  Tachycardia  Listlessness    Offered ambulance but husband felt he could safely transport her to [facility].  Future Appointments  Date Time Provider Department Center  08/11/2023 10:30 AM [facility]  08/28/2023  1:00 PM [facility] ANNUAL WELLNESS VISIT 1  [facility]           Clinical Presentation:    67 y.o. 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 Her reasons/main concerns/chief complaints for today's office visit are Nausea (Started feeling sick last Saturday. ), No appetite (No fever.), Cough (Productive with mucus. Took Robitussin with no relief.), lethargic (Giving a blank stare, walking ability has decreased.), and Labored breathing   AI-Extracted: Discussed the use of AI scribe software for clinical note transcription with the patient, who gave verbal consent to proceed.  History of Present Illness   The patient, with a known history of ataxia, presented with a severe illness following a [place] the [place]. Approximately two weeks after returning home, the patient's sister was hospitalized with a viral infection, and the patient subsequently developed similar symptoms. The patient has been bedridden for over a week, experiencing persistent nausea, loss of appetite, cough, and labored breathing. The onset of symptoms was approximately eight to nine days prior to the consultation.  The patient's cough has been present for most of the illness duration, but not since the onset. The patient also reported feeling extremely weak, with a significant decrease in mobility and energy levels. The patient's condition  has not improved over the course of the illness, and she has been unable to maintain her usual level of activity or self-care.  The patient's family member, who accompanied her to the  consultation, also reported feeling unwell, but not to the same severity as the patient. The patient's family member had been providing care for the patient at home.  The patient's oxygen levels and heart rate were abnormal, indicating potential respiratory distress. The patient's cough was also suggestive of potential pneumonia.  The patient's family member expressed willingness to have the patient admitted to the hospital if necessary, recognizing the severity of her condition.        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 Outpatient Medications Prior to Visit  Medication Sig   albuterol (VENTOLIN HFA) 108 (90 Base) MCG/ACT inhaler INHALE 2 PUFFS BY MOUTH EVERY 4 HOURS AS NEEDED FOR WHEEZING FOR SHORTNESS OF BREATH   alendronate (FOSAMAX) 70 MG tablet Take 1 tablet (70 mg total) by mouth every 7 (seven) days. Take with a full glass of water on an empty stomach. Sit upright for 30 minutes   atorvastatin (LIPITOR) 10 MG tablet Take 1 tablet (10 mg total) by mouth at bedtime.   Glycopyrrolate-Formoterol (BEVESPI AEROSPHERE) 9-4.8 MCG/ACT AERO Inhale 2 puffs into the lungs 2 (two) times daily.   oxybutynin (DITROPAN) 5 MG tablet Take 1 tablet (5 mg total) by mouth 2 (two) times daily.   rizatriptan (MAXALT) 10 MG tablet Take 10 mg by mouth as needed for migraine. May repeat in 2 hours if needed   valACYclovir (VALTREX) 1000 MG tablet Take 2 tablets (2,000 mg total) by mouth 2 (two) times daily. Once, as needed for cold sores   venlafaxine XR (EFFEXOR-XR) 150 MG 24 hr capsule TAKE 1 CAPSULE BY MOUTH AT  BEDTIME **TAKE  WITH  75MG   CAPSULES  FOR  225MG   TOTAL  DAILY  DOSE   venlafaxine XR (EFFEXOR-XR) 75 MG 24 hr capsule TAKE 1 CAPSULE BY MOUTH ONCE DAILY **TAKE  WITH  150MG   CAPSULES  FOR  225MG   TOTAL  DAILY  DOSE   [DISCONTINUED] azithromycin (ZITHROMAX) 250 MG tablet Take 2 tabs today, then 1 tab daily for 4 days   [DISCONTINUED] guaiFENesin-codeine 100-10 MG/5ML syrup Take 5 mLs by mouth every 6 (six) hours as needed for cough.   [DISCONTINUED] predniSONE (DELTASONE) 20 MG tablet Take 3 tabs daily for 5 days   No facility-administered medications prior to visit.         Clinical Data Analysis:   Physical Exam  BP (!) 144/89 (BP Location: Right Arm, Patient Position: Sitting)   Pulse (!) 102   Temp 98 F (36.7 C) (Temporal)   Ht 5\' 8"  (1.727 m)   SpO2 93%   BMI 19.77 kg/m  Wt Readings from Last 10 Encounters:  03/13/23 130 lb (59 kg)  12/23/22 119 lb 9.6 oz (54.3 kg)  10/20/22 124 lb 12.8 oz (56.6 kg)  09/18/22 121 lb 9.6 oz (55.2 kg)  09/15/22 120 lb 3.2 oz (54.5 kg)  07/18/22 126 lb (57.2 kg)  07/08/21 128 lb 12.8 oz (58.4 kg)  04/04/21 127 lb 6.4 oz (57.8 kg)  01/28/21 127 lb  9.6 oz (57.9 kg)  08/06/20 121 lb (54.9 kg)   Vital signs reviewed.  Nursing notes reviewed. Weight trend reviewed. Abnormalities and Problem-Specific physical exam findings:  listless, weak, super slow and unsteady gait with walker, too tired to even speak, labored breathing with minimal exertion.  General Appearance:  =  Well-groomed, ill-appearing female.  Well proportioned with no abnormal fat distribution.  weak muscle tone. Skin: Clear and suspect dehydrated. Pulmonary:  Normal work of breathing at rest, no respiratory distress apparent. SpO2: 93 %  Musculoskeletal: All extremities are intact.  Neurological:  Awake, alert, oriented, and engaged.  Obvious severe gait disturbance.  Sensorium seems clouded.   Psychiatric:   toolistless to speak. Hardly any words made.  Results Reviewed:       Results for orders placed or performed in visit on 06/29/23  POC COVID-19  Result Value Ref Range   SARS Coronavirus 2 Ag Positive (A) Negative    Office Visit on 06/29/2023  Component Date Value   SARS Coronavirus 2 Ag 06/29/2023 Positive (A)   Office Visit on 12/23/2022  Component Date Value   SARS Coronavirus 2 Ag 12/23/2022 Negative   Lab on 09/19/2022  Component Date Value   Ammonia 09/19/2022 40 (H)    Iron 09/19/2022 94    TIBC 09/19/2022 402    %SAT 09/19/2022 23    Ferritin 09/19/2022 16    Cholesterol 09/19/2022 130    Triglycerides 09/19/2022 119.0    HDL 09/19/2022 54.40    VLDL 09/19/2022 23.8    LDL Cholesterol 09/19/2022 51    Total CHOL/HDL Ratio 09/19/2022 2    NonHDL 09/19/2022 75.18    Sodium 09/19/2022 143    Potassium 09/19/2022 4.3    Chloride 09/19/2022 105    CO2 09/19/2022 31    Glucose, Bld 09/19/2022 109 (H)    BUN 09/19/2022 17    Creatinine, Ser 09/19/2022 1.20    Total Bilirubin 09/19/2022 0.5    Alkaline Phosphatase 09/19/2022 77    AST 09/19/2022 19    ALT 09/19/2022 10    Total Protein 09/19/2022 6.6    Albumin 09/19/2022 3.8    GFR 09/19/2022 46.77 (L)    Calcium 09/19/2022 9.7   Office Visit on 09/18/2022  Component Date Value   Lipase 09/18/2022 14.0    Sed Rate 09/18/2022 30    MICRO NUMBER: 09/18/2022 [id number]    SPECIMEN QUALITY: 09/18/2022 Adequate    Sample Source 09/18/2022 URINE, CLEAN CATCH    STATUS: 09/18/2022 FINAL    ISOLATE 1: 09/18/2022 Citrobacter braakii (A)    COMMENT: 09/18/2022 Additional non-predominating organism(s) isolated. These organisms, commonly found on external and internal genitalia, are considered colonizers. No further testing performed.    WBC 09/18/2022 9.7    RBC 09/18/2022 4.44    Hemoglobin 09/18/2022 12.5    HCT 09/18/2022 38.7    MCV 09/18/2022 87.2    MCHC 09/18/2022 32.4    RDW 09/18/2022 15.8 (H)    Platelets 09/18/2022 374.0    Neutrophils Relative % 09/18/2022 60.7     Lymphocytes Relative 09/18/2022 26.8    Monocytes Relative 09/18/2022 10.9    Eosinophils Relative 09/18/2022 0.7    Basophils Relative 09/18/2022 0.9    Neutro Abs 09/18/2022 5.9    Lymphs Abs 09/18/2022 2.6    Monocytes Absolute 09/18/2022 1.1 (H)    Eosinophils Absolute 09/18/2022 0.1    Basophils Absolute 09/18/2022 0.1    TSH 09/18/2022 1.53   Admission on 09/15/2022, Discharged on 09/15/2022  Component Date Value   WBC 09/15/2022 10.8 (H)    RBC 09/15/2022 5.35 (H)    Hemoglobin 09/15/2022 14.8    HCT 09/15/2022 47.6 (H)    MCV 09/15/2022 89.0    MCH 09/15/2022 27.7    MCHC 09/15/2022 31.1    RDW 09/15/2022 15.4    Platelets 09/15/2022 513 (H)    nRBC 09/15/2022 0.0    Neutrophils Relative % 09/15/2022 62    Neutro Abs 09/15/2022 6.6    Lymphocytes Relative 09/15/2022 28    Lymphs Abs 09/15/2022 3.1    Monocytes Relative 09/15/2022 8    Monocytes Absolute 09/15/2022 0.9    Eosinophils Relative 09/15/2022 1    Eosinophils Absolute 09/15/2022 0.1    Basophils Relative 09/15/2022 1    Basophils Absolute 09/15/2022 0.1    Immature Granulocytes 09/15/2022 0    Abs Immature Granulocytes 09/15/2022 0.03    Troponin I (High Sensiti* 09/15/2022 7    Sodium 09/15/2022 141    Potassium 09/15/2022 3.7    Chloride 09/15/2022 103    CO2 09/15/2022 27    Glucose, Bld 09/15/2022 119 (H)    BUN 09/15/2022 14    Creatinine, Ser 09/15/2022 0.82    Calcium 09/15/2022 9.7    Total Protein 09/15/2022 7.9    Albumin 09/15/2022 4.1    AST 09/15/2022 17    ALT 09/15/2022 11    Alkaline Phosphatase 09/15/2022 83    Total Bilirubin 09/15/2022 0.7    GFR, Estimated 09/15/2022 >60    Anion gap 09/15/2022 11    Glucose-Capillary 09/15/2022 87    B Natriuretic Peptide 09/15/2022 63.8    SARS Coronavirus 2 by RT* 09/15/2022 NEGATIVE    Influenza A by PCR 09/15/2022 NEGATIVE    Influenza B by PCR 09/15/2022 NEGATIVE    Color, Urine 09/15/2022 STRAW (A)    APPearance 09/15/2022 CLEAR     Specific Gravity, Urine 09/15/2022 1.011    pH 09/15/2022 6.0    Glucose, UA 09/15/2022 NEGATIVE    Hgb urine dipstick 09/15/2022 SMALL (A)    Bilirubin Urine 09/15/2022 NEGATIVE    Ketones, ur 09/15/2022 NEGATIVE    Protein, ur 09/15/2022 NEGATIVE    Nitrite 09/15/2022 NEGATIVE    Leukocytes,Ua 09/15/2022 SMALL (A)    RBC / HPF 09/15/2022 0-5    WBC, UA 09/15/2022 6-10    Bacteria, UA 09/15/2022 NONE SEEN    Squamous Epithelial / HPF 09/15/2022 0-5    Mucus 09/15/2022 PRESENT    Hyaline Casts, UA 09/15/2022 PRESENT    D-Dimer, Quant 09/15/2022 6.70 (H)   Abstract on 08/04/2022  Component Date Value   HM Mammogram 07/30/2022 0-4 [facility]    No image results found.   No results found.     This encounter employed real-time, collaborative documentation. The patient actively reviewed and updated their medical record on a shared screen, ensuring transparency and facilitating joint problem-solving for the problem list, overview, and plan. This approach promotes accurate, informed care. The treatment plan was discussed and reviewed in detail, including medication safety, potential side effects, and all patient questions. We confirmed understanding and comfort with the plan. Follow-up instructions were established, including contacting the office for any concerns, returning if symptoms worsen, persist, or new symptoms develop, and precautions for potential emergency department visits. ----------------------------------------------------- Lula Olszewski, MD  06/29/2023 12:19 PM  [facility]:  [phone]

## 2023-06-30 DIAGNOSIS — J431 Panlobular emphysema: Secondary | ICD-10-CM | POA: Diagnosis not present

## 2023-06-30 DIAGNOSIS — Z7983 Long term (current) use of bisphosphonates: Secondary | ICD-10-CM | POA: Diagnosis not present

## 2023-06-30 DIAGNOSIS — U071 COVID-19: Secondary | ICD-10-CM | POA: Diagnosis not present

## 2023-06-30 DIAGNOSIS — Z8249 Family history of ischemic heart disease and other diseases of the circulatory system: Secondary | ICD-10-CM | POA: Diagnosis not present

## 2023-06-30 DIAGNOSIS — R627 Adult failure to thrive: Secondary | ICD-10-CM | POA: Diagnosis not present

## 2023-06-30 DIAGNOSIS — Z79899 Other long term (current) drug therapy: Secondary | ICD-10-CM | POA: Diagnosis not present

## 2023-06-30 DIAGNOSIS — Z8616 Personal history of COVID-19: Secondary | ICD-10-CM | POA: Diagnosis not present

## 2023-06-30 DIAGNOSIS — M810 Age-related osteoporosis without current pathological fracture: Secondary | ICD-10-CM | POA: Diagnosis present

## 2023-06-30 DIAGNOSIS — R531 Weakness: Secondary | ICD-10-CM | POA: Diagnosis not present

## 2023-06-30 DIAGNOSIS — I2510 Atherosclerotic heart disease of native coronary artery without angina pectoris: Secondary | ICD-10-CM | POA: Diagnosis present

## 2023-06-30 DIAGNOSIS — J9600 Acute respiratory failure, unspecified whether with hypoxia or hypercapnia: Secondary | ICD-10-CM | POA: Diagnosis present

## 2023-06-30 DIAGNOSIS — G119 Hereditary ataxia, unspecified: Secondary | ICD-10-CM | POA: Diagnosis present

## 2023-06-30 DIAGNOSIS — Z833 Family history of diabetes mellitus: Secondary | ICD-10-CM | POA: Diagnosis not present

## 2023-06-30 DIAGNOSIS — Z90710 Acquired absence of both cervix and uterus: Secondary | ICD-10-CM | POA: Diagnosis not present

## 2023-06-30 DIAGNOSIS — Z885 Allergy status to narcotic agent status: Secondary | ICD-10-CM | POA: Diagnosis not present

## 2023-06-30 DIAGNOSIS — Z823 Family history of stroke: Secondary | ICD-10-CM | POA: Diagnosis not present

## 2023-06-30 DIAGNOSIS — Z882 Allergy status to sulfonamides status: Secondary | ICD-10-CM | POA: Diagnosis not present

## 2023-06-30 DIAGNOSIS — Z7951 Long term (current) use of inhaled steroids: Secondary | ICD-10-CM | POA: Diagnosis not present

## 2023-06-30 DIAGNOSIS — Z9081 Acquired absence of spleen: Secondary | ICD-10-CM | POA: Diagnosis not present

## 2023-06-30 DIAGNOSIS — F17210 Nicotine dependence, cigarettes, uncomplicated: Secondary | ICD-10-CM | POA: Diagnosis present

## 2023-06-30 DIAGNOSIS — J439 Emphysema, unspecified: Secondary | ICD-10-CM | POA: Diagnosis present

## 2023-06-30 LAB — HIV ANTIBODY (ROUTINE TESTING W REFLEX): HIV Screen 4th Generation wRfx: NONREACTIVE

## 2023-06-30 MED ORDER — ENSURE ENLIVE PO LIQD
237.0000 mL | Freq: Two times a day (BID) | ORAL | Status: DC
  Administered 2023-06-30 – 2023-07-02 (×4): 237 mL via ORAL

## 2023-06-30 MED ORDER — TRAMADOL HCL 50 MG PO TABS
50.0000 mg | ORAL_TABLET | Freq: Two times a day (BID) | ORAL | Status: DC | PRN
  Administered 2023-06-30: 50 mg via ORAL
  Filled 2023-06-30: qty 1

## 2023-06-30 MED ORDER — ASPIRIN-ACETAMINOPHEN-CAFFEINE 250-250-65 MG PO TABS
1.0000 | ORAL_TABLET | Freq: Once | ORAL | Status: AC
  Administered 2023-06-30: 1 via ORAL
  Filled 2023-06-30: qty 1

## 2023-06-30 NOTE — Progress Notes (Signed)
  Progress Note   Patient: Sherry Zimmerman AVW:[id number] DOB: 06/30/1956 DOA: 06/29/2023     0 DOS: the patient was seen and examined on 06/30/2023   Brief hospital course: 67 year old woman PMH including COPD, cerebellar ataxia, presented to [place] primary care today with 2-week history of fatigue, weight loss, poor appetite, shortness of breath and cough. SpO2 was 93% on room air per chart and rapid COVID was positive. She was referred to the emergency department for further evaluation. She was noted to be ill-appearing and in respiratory distress and was referred for admission.  Treating for COVID with IV steroids with clinical improvement.  Likely home in 48 hours.  Assessment and Plan: Acute respiratory distress without hypoxia (SpO2 93% on room air) secondary to COVID Failure to thrive Generalized weakness Symptoms anywhere from 9 to 14 days.  Unclear when COVID began.  Chest x-ray nonacute and respiratory status currently appears stable.  Responding well to IV steroids.  No indication for remdesivir.  Beyond the window for Paxlovid. Plan HH PTOT   COPD Ongoing cigarette smoker half pack per day COPD appears stable.  Bronchodilators.  Will recommend smoking cessation.   Cerebellar ataxia with chronic dysarthria Stable  Overall much better, likely home in 48 hours or sooner     Subjective:  Feels a lot better today. Breathing ok. Has more energy.  Physical Exam: Vitals:   06/29/23 1931 06/30/23 0614 06/30/23 0755 06/30/23 1216  BP: (!) 166/75 139/79  (!) 156/89  Pulse: 94 93  (!) 108  Resp: 18 19  18   Temp: 97.9 F (36.6 C) 98.7 F (37.1 C)  97.8 F (36.6 C)  TempSrc: Oral   Oral  SpO2: 90% 99% 95% 91%  Weight:      Height:       Physical Exam Vitals reviewed.  Constitutional:      General: She is not in acute distress.    Appearance: She is not ill-appearing or toxic-appearing.  Cardiovascular:     Rate and Rhythm: Normal rate and regular rhythm.     Heart  sounds: No murmur heard. Pulmonary:     Effort: Pulmonary effort is normal. No respiratory distress.     Breath sounds: No wheezing, rhonchi or rales.  Neurological:     Mental Status: She is alert.  Psychiatric:        Mood and Affect: Mood normal.        Behavior: Behavior normal.     Data Reviewed: Lactic acid 3.1, ordered by EDP, unclear why as first was WNL  Family Communication:   Disposition: Status is: Inpatient     Time spent: 20 minutes  Author: Brendia Sacks, MD 06/30/2023 6:45 PM  For on call review [email or web address].

## 2023-06-30 NOTE — Evaluation (Signed)
Occupational Therapy Evaluation Patient Details Name: Sherry Zimmerman MRN: [id number] DOB: December 05, 1956 Today's Date: 06/30/2023   History of Present Illness Patient is a 67 year old woman who presented to [facility] primary care 06/29/23 with 2-week history of fatigue, weight loss, poor appetite, shortness of breath and cough.  SpO2 was 93% on room air per chart and rapid COVID was positive.PMH including COPD, cerebellar ataxia,   Clinical Impression   Patient is a 67 year old female who was admitted for above. Patient was living at home with spouse with independence in ADLs prior level. Currently, Patient was noted to have decreased functional activity tolerance, decreased endurance, decreased standing balance, decreased safety awareness, and decreased knowledge of AD/AE impacting participation in ADLs. Patient to transition home with [facility] services and family support at time of d/c. Patient would continue to benefit from skilled OT services at this time while admitted and after d/c to address noted deficits in order to improve overall safety and independence in ADLs.       Recommendations for follow up therapy are one component of a multi-disciplinary discharge planning process, led by the attending physician.  Recommendations may be updated based on patient status, additional functional criteria and insurance authorization.   Assistance Recommended at Discharge Intermittent Supervision/Assistance  Patient can return home with the following A little help with walking and/or transfers;A little help with bathing/dressing/bathroom;Assistance with cooking/housework;Direct supervision/assist for medications management;Assist for transportation;Help with stairs or ramp for entrance;Direct supervision/assist for financial management    Functional Status Assessment  Patient has had a recent decline in their functional status and demonstrates the ability to make significant improvements in function in a  reasonable and predictable amount of time.  Equipment Recommendations  None recommended by OT       Precautions / Restrictions Precautions Precautions: Fall Precaution Comments: has had multiple falls Restrictions Weight Bearing Restrictions: No      Mobility Bed Mobility Overal bed mobility: Needs Assistance Bed Mobility: Supine to Sit, Sit to Supine     Supine to sit: Min guard Sit to supine: Min guard   General bed mobility comments: with increased time.           Balance Overall balance assessment: Needs assistance, History of Falls Sitting-balance support: Bilateral upper extremity supported, Feet supported Sitting balance-Leahy Scale: Fair     Standing balance support: Bilateral upper extremity supported, During functional activity, Reliant on assistive device for balance Standing balance-Leahy Scale: Poor       ADL either performed or assessed with clinical judgement   ADL Overall ADL's : Needs assistance/impaired Eating/Feeding: Modified independent;Sitting   Grooming: Brushing hair;Min guard;Sitting Grooming Details (indicate cue type and reason): patients comb from this facility was not strong enough to get through tangles in patients hair. patient reported that grandson was bringing her a brush later. Upper Body Bathing: Min guard;Sitting   Lower Body Bathing: Minimal assistance;Sitting/lateral leans   Upper Body Dressing : Min guard;Sitting   Lower Body Dressing: Min guard;Minimal assistance;Sitting/lateral leans   Toilet Transfer: Minimal assistance;Stand-pivot;Rolling walker (2 wheels);BSC/3in1 Toilet Transfer Details (indicate cue type and reason): with cues for proper hand placement. patient attempted to sit prior to completion of turn. Toileting- Clothing Manipulation and Hygiene: Minimal assistance;Sitting/lateral lean                Pertinent Vitals/Pain Pain Assessment Pain Assessment: Faces Faces Pain Scale: Hurts whole lot Pain  Location: headache Pain Descriptors / Indicators: Headache Pain Intervention(s): Limited activity within patient's tolerance, Monitored during session  Hand Dominance Left   Extremity/Trunk Assessment Upper Extremity Assessment Upper Extremity Assessment: Overall WFL for tasks assessed   Lower Extremity Assessment Lower Extremity Assessment: Defer to PT evaluation   Cervical / Trunk Assessment Cervical / Trunk Assessment: Kyphotic   Communication     Cognition Arousal/Alertness: Awake/alert Behavior During Therapy: WFL for tasks assessed/performed Overall Cognitive Status: Within Functional Limits for tasks assessed                      Home Living Family/patient expects to be discharged to:: Private residence Living Arrangements: Spouse/significant other Available Help at Discharge: Family;Available 24 hours/day Type of Home: Mobile home Home Access: Ramped entrance Entrance Stairs-Number of Steps: 3 Entrance Stairs-Rails: Right;Left Home Layout: One level     Bathroom Shower/Tub: Tub/shower unit;Walk-in shower   Bathroom Toilet: Standard     Home Equipment: Rollator (4 wheels);Shower seat - built in;Hand held shower head          Prior Functioning/Environment Prior Level of Function : Independent/Modified Independent               ADLs Comments: performs ADL's        OT Problem List: Decreased activity tolerance;Impaired balance (sitting and/or standing);Decreased coordination;Decreased safety awareness;Decreased knowledge of precautions;Decreased knowledge of use of DME or AE      OT Treatment/Interventions: Self-care/ADL training;Energy conservation;Therapeutic exercise;DME and/or AE instruction;Therapeutic activities;Patient/family education;Balance training    OT Goals(Current goals can be found in the care plan section) Acute Rehab OT Goals Patient Stated Goal: to go home OT Goal Formulation: With patient Time For Goal Achievement:  07/14/23 Potential to Achieve Goals: Fair  OT Frequency: Min 1X/week       AM-PAC OT "6 Clicks" Daily Activity     Outcome Measure Help from another person eating meals?: A Little Help from another person taking care of personal grooming?: A Little Help from another person toileting, which includes using toliet, bedpan, or urinal?: A Little Help from another person bathing (including washing, rinsing, drying)?: A Little Help from another person to put on and taking off regular upper body clothing?: A Little Help from another person to put on and taking off regular lower body clothing?: A Little 6 Click Score: 18   End of Session Equipment Utilized During Treatment: Gait belt;Rolling walker (2 wheels) Nurse Communication: Patient requests pain meds (for head ache)  Activity Tolerance: Patient tolerated treatment well Patient left: in bed;with call bell/phone within reach;with bed alarm set  OT Visit Diagnosis: Unsteadiness on feet (R26.81);Other abnormalities of gait and mobility (R26.89)                Time: 4098-1191 OT Time Calculation (min): 15 min Charges:  OT General Charges $OT Visit: 1 Visit OT Evaluation $OT Eval Low Complexity: 1 Low  Rami Waddle OTR/L, MS Acute Rehabilitation Department Office# [phone]   Selinda Flavin 06/30/2023, 5:06 PM

## 2023-06-30 NOTE — Evaluation (Signed)
Physical Therapy Evaluation Patient Details Name: Sherry Zimmerman MRN: [id number] DOB: 01/28/1956 Today's Date: 06/30/2023  History of Present Illness  67 year old woman PMH including COPD, cerebellar ataxia, presented to [place] primary care 06/29/23 with 2-week history of fatigue, weight loss, poor appetite, shortness of breath and cough.  SpO2 was 93% on room air per chart and rapid COVID was positive.  Clinical Impression  Pt admitted with above diagnosis.  Pt currently with functional limitations due to the deficits listed below (see PT Problem List). Pt will benefit from acute skilled PT to increase their independence and safety with mobility to allow discharge.     The patient  reports modified independence PTA, using rollator, and will have family support at DC. Today, patient presents with generalized weakness and malaise but  did  stand and  pivot to [facility] then to recliner. Patient  presents with ataxia but  manages to to stand with UE support.  Continue PT for mobility.  HR 115, SPO2 on RA 95% after mobility.      Assistance Recommended at Discharge Intermittent Supervision/Assistance  If plan is discharge home, recommend the following:  Can travel by private vehicle  A little help with walking and/or transfers;A little help with bathing/dressing/bathroom;Assistance with cooking/housework;Assist for transportation;Help with stairs or ramp for entrance        Equipment Recommendations None recommended by PT  Recommendations for Other Services    OT   Functional Status Assessment Patient has had a recent decline in their functional status and demonstrates the ability to make significant improvements in function in a reasonable and predictable amount of time.     Precautions / Restrictions Precautions Precautions: Fall Precaution Comments: has had multiple falls Restrictions Weight Bearing Restrictions: No      Mobility  Bed Mobility Overal bed mobility: Needs  Assistance Bed Mobility: Supine to Sit     Supine to sit: Min guard     General bed mobility comments: extra time, noted ataxia of ;legs>arms    Transfers Overall transfer level: Needs assistance Equipment used: Rolling walker (2 wheels) Transfers: Sit to/from Stand, Bed to chair/wheelchair/BSC Sit to Stand: Min assist Stand pivot transfers: Min assist         General transfer comment: transfer  with stand pivot to [facility], use of RW to step to recliner, noted ataxia    Ambulation/Gait                  Stairs            Wheelchair Mobility     Tilt Bed    Modified Rankin (Stroke Patients Only)       Balance Overall balance assessment: Needs assistance, History of Falls Sitting-balance support: Bilateral upper extremity supported, Feet supported Sitting balance-Leahy Scale: Fair     Standing balance support: Bilateral upper extremity supported, During functional activity, Reliant on assistive device for balance Standing balance-Leahy Scale: Poor                               Pertinent Vitals/Pain Pain Assessment Pain Assessment: Faces Faces Pain Scale: Hurts little more Pain Location: generalized Pain Descriptors / Indicators: Discomfort    Home Living Family/patient expects to be discharged to:: Private residence Living Arrangements: Spouse/significant other;Other relatives Available Help at Discharge: Family;Available 24 hours/day Type of Home: Mobile home Home Access: Stairs to enter Entrance Stairs-Rails: Right;Left Entrance Stairs-Number of Steps: 3   Home Layout: One level  Home Equipment: Rollator (4 wheels);Shower seat - built in;Hand held shower head      Prior Function Prior Level of Function : Independent/Modified Independent             Mobility Comments: uses rollator exclusively ADLs Comments: performs ADL's     Hand Dominance   Dominant Hand: Left    Extremity/Trunk Assessment   Upper Extremity  Assessment Upper Extremity Assessment: RUE deficits/detail;LUE deficits/detail RUE Deficits / Details: noted ataxia,dysmetria but able to perform self care LUE Deficits / Details: similar to right    Lower Extremity Assessment Lower Extremity Assessment: RLE deficits/detail;LLE deficits/detail RLE Deficits / Details: atacic, grosly WFL for standing /pivoting LLE Deficits / Details: similar to right, ataxic    Cervical / Trunk Assessment Cervical / Trunk Assessment: Kyphotic  Communication   Communication:  (voice affected by ataxia)  Cognition Arousal/Alertness: Awake/alert Behavior During Therapy: WFL for tasks assessed/performed Overall Cognitive Status: Within Functional Limits for tasks assessed                                          General Comments      Exercises     Assessment/Plan    PT Assessment Patient needs continued PT services  PT Problem List Decreased activity tolerance;Decreased strength;Decreased mobility;Decreased coordination       PT Treatment Interventions DME instruction;Therapeutic activities;Balance training;Gait training;Functional mobility training;Therapeutic exercise;Patient/family education    PT Goals (Current goals can be found in the Care Plan section)  Acute Rehab PT Goals Patient Stated Goal: go home, feel better Time For Goal Achievement: 07/14/23 Potential to Achieve Goals: Good    Frequency Min 1X/week     Co-evaluation               AM-PAC PT "6 Clicks" Mobility  Outcome Measure Help needed turning from your back to your side while in a flat bed without using bedrails?: A Little Help needed moving from lying on your back to sitting on the side of a flat bed without using bedrails?: A Little Help needed moving to and from a bed to a chair (including a wheelchair)?: A Little Help needed standing up from a chair using your arms (e.g., wheelchair or bedside chair)?: A Lot Help needed to walk in hospital  room?: Total Help needed climbing 3-5 steps with a railing? : Total 6 Click Score: 13    End of Session   Activity Tolerance: Patient limited by fatigue Patient left: in chair;with call bell/phone within reach;with chair alarm set Nurse Communication: Mobility status PT Visit Diagnosis: Unsteadiness on feet (R26.81);History of falling (Z91.81);Difficulty in walking, not elsewhere classified (R26.2);Other symptoms and signs involving the nervous system (R29.898)    Time: 2952-8413 PT Time Calculation (min) (ACUTE ONLY): 44 min   Charges:   PT Evaluation $PT Eval Low Complexity: 1 Low PT Treatments $Therapeutic Activity: 8-22 mins $Self Care/Home Management: 8-22 PT General Charges $$ ACUTE PT VISIT: 1 Visit         Blanchard Kelch PT Acute Rehabilitation Services Office [phone] Weekend pager-[phone]   Rada Hay 06/30/2023, 12:02 PM

## 2023-06-30 NOTE — Progress Notes (Signed)
Initial Nutrition Assessment  INTERVENTION:   -Ensure Plus High Protein po BID, each supplement provides 350 kcal and 20 grams of protein.   -Multivitamin with minerals daily  NUTRITION DIAGNOSIS:   Increased nutrient needs related to chronic illness as evidenced by estimated needs.  GOAL:   Patient will meet greater than or equal to 90% of their needs  MONITOR:   PO intake, Supplement acceptance, Labs, Weight trends, I & O's  REASON FOR ASSESSMENT:   Consult Assessment of nutrition requirement/status  ASSESSMENT:   67 year old woman PMH including COPD, cerebellar ataxia, presented to [place] primary care today with 2-week history of fatigue, weight loss, poor appetite, shortness of breath and cough.  SpO2 was 93% on room air per chart and rapid COVID was positive.  Unable to speak with patient at time of visit.  Per chart review, pt has been having COVID symptoms for ~2 weeks. Pt reported poor appetite and has been losing weight as a result.  Per weight records, no weight loss noted. Will continue to monitor weight trends.  Will order Ensure supplements given increased needs from COPD and acute COVID infection.    Medications: Multivitamin with minerals daily, Lactated ringers  Labs reviewed.  NUTRITION - FOCUSED PHYSICAL EXAM:  Unable to complete at this time.  Diet Order:   Diet Order             Diet regular Room service appropriate? Yes; Fluid consistency: Thin  Diet effective now                   EDUCATION NEEDS:   No education needs have been identified at this time  Skin:  Skin Assessment: Reviewed RN Assessment  Last BM:  7/23- type 1  Height:   Ht Readings from Last 1 Encounters:  06/29/23 5\' 8"  (1.727 m)    Weight:   Wt Readings from Last 1 Encounters:  06/29/23 59 kg    BMI:  Body mass index is 19.78 kg/m.  Estimated Nutritional Needs:   Kcal:  1750-1950  Protein:  85-95g  Fluid:  1.9L/day  Tilda Franco, MS, RD,  LDN Inpatient Clinical Dietitian Contact information available via Amion

## 2023-06-30 NOTE — Progress Notes (Signed)
PT demonstrated hands on understanding of Flutter device. Non productive cough at this time.

## 2023-07-01 DIAGNOSIS — J431 Panlobular emphysema: Secondary | ICD-10-CM | POA: Diagnosis not present

## 2023-07-01 DIAGNOSIS — U071 COVID-19: Secondary | ICD-10-CM | POA: Diagnosis not present

## 2023-07-01 NOTE — Plan of Care (Signed)

## 2023-07-01 NOTE — Progress Notes (Signed)
Physical Therapy Treatment Patient Details Name: Sherry Zimmerman MRN: [id number] DOB: Dec 10, 1955 Today's Date: 07/01/2023   History of Present Illness Patient is a 67 year old woman who presented to [place] primary care 06/29/23 with 2-week history of fatigue, weight loss, poor appetite, shortness of breath and cough.  SpO2 was 93% on room air per chart and rapid COVID was positive.PMH including COPD, cerebellar ataxia,    PT Comments  Pt on COVID precautions  Assisted OOB to amb in hallway.  General Gait Details: tyolerated an increased distance in hallway using Rollator amb 45 feet x 2 with one seated rest break.  Dyspnea 1/4.  mild cough.  avg RA 96% Pt plans to return home with Boy Friend.  Has a Rollator.     Assistance Recommended at Discharge Intermittent Supervision/Assistance  If plan is discharge home, recommend the following:  Can travel by private vehicle    A little help with walking and/or transfers;A little help with bathing/dressing/bathroom;Assistance with cooking/housework;Assist for transportation;Help with stairs or ramp for entrance      Equipment Recommendations  None recommended by PT    Recommendations for Other Services       Precautions / Restrictions Precautions Precautions: Fall Precaution Comments: has had multiple falls, Hx Cerellar Ataxia Restrictions Weight Bearing Restrictions: No     Mobility  Bed Mobility Overal bed mobility: Modified Independent             General bed mobility comments: pt self able with increased time    Transfers Overall transfer level: Needs assistance Equipment used: Rolling walker (2 wheels) Transfers: Sit to/from Stand Sit to Stand: Supervision, Min guard           General transfer comment: mild ataxia and initial posterior lean    Ambulation/Gait Ambulation/Gait assistance: Min guard, Min assist Gait Distance (Feet): 90 Feet (45 feet x 2) Assistive device: Rollator (4 wheels) Gait  Pattern/deviations: Step-through pattern, Drifts right/left, Ataxic Gait velocity: decreased     General Gait Details: tyolerated an increased distance in hallway using Rollator amb 45 feet x 2 with one seated rest break.  Dyspnea 1/4.  mild cough.  avg RA 96%   Stairs             Wheelchair Mobility     Tilt Bed    Modified Rankin (Stroke Patients Only)       Balance                                            Cognition Arousal/Alertness: Awake/alert Behavior During Therapy: WFL for tasks assessed/performed Overall Cognitive Status: Within Functional Limits for tasks assessed                                 General Comments: AxO x 3 very pleasant Lady who lives with her Boy Friend and now he has COVID        Exercises      General Comments        Pertinent Vitals/Pain Pain Assessment Pain Assessment: No/denies pain    Home Living                          Prior Function            PT Goals (current goals can now be  found in the care plan section) Progress towards PT goals: Progressing toward goals    Frequency    Min 1X/week      PT Plan Current plan remains appropriate    Co-evaluation              AM-PAC PT "6 Clicks" Mobility   Outcome Measure  Help needed turning from your back to your side while in a flat bed without using bedrails?: None Help needed moving from lying on your back to sitting on the side of a flat bed without using bedrails?: None Help needed moving to and from a bed to a chair (including a wheelchair)?: None Help needed standing up from a chair using your arms (e.g., wheelchair or bedside chair)?: None Help needed to walk in hospital room?: A Little Help needed climbing 3-5 steps with a railing? : A Little 6 Click Score: 22    End of Session Equipment Utilized During Treatment: Gait belt Activity Tolerance: Patient limited by fatigue Patient left: in bed;with call  bell/phone within reach Nurse Communication: Mobility status PT Visit Diagnosis: Unsteadiness on feet (R26.81);History of falling (Z91.81);Difficulty in walking, not elsewhere classified (R26.2);Other symptoms and signs involving the nervous system (R29.898)     Time: 1515-1540 PT Time Calculation (min) (ACUTE ONLY): 25 min  Charges:    $Gait Training: 8-22 mins $Therapeutic Activity: 8-22 mins PT General Charges $$ ACUTE PT VISIT: 1 Visit                     Felecia Shelling  PTA Acute  Rehabilitation Services Office M-F          [phone]

## 2023-07-01 NOTE — Progress Notes (Signed)
Mobility Specialist - Progress Note   07/01/23 1024  Mobility  Activity Transferred from bed to chair  Level of Assistance Standby assist, set-up cues, supervision of patient - no hands on  Assistive Device Front wheel walker  Distance Ambulated (ft) 5 ft  Activity Response Tolerated well  Mobility Referral Yes  $Mobility charge 1 Mobility  Mobility Specialist Start Time (ACUTE ONLY) 1015  Mobility Specialist Stop Time (ACUTE ONLY) 1023  Mobility Specialist Time Calculation (min) (ACUTE ONLY) 8 min   Pt received in bed and agreeable to transfer to recliner. No complaints during session. Pt to recliner after session with all needs met.    [facility]

## 2023-07-01 NOTE — Progress Notes (Signed)
[facility] PROGRESS NOTE    Progress Note  Sherry Zimmerman  ZOX:[id number] DOB: 1956/01/09 DOA: 06/29/2023 PCP: Willow Ora, MD     Brief Narrative:   Sherry Zimmerman is an 67 y.o. female past medical history of COPD, cerebellar ataxia went to his PCP for 2 weeks of fatigue weight loss poor appetite and shortness of breath COVID test was positive was satting 93% on room air referred to the ED   Assessment/Plan:   Acute respiratory distress without hypoxia secondary to COVID-19/failure to thrive: She has been satting greater 93% on room air. It is unclear when COVID began, imaging showed no acute findings. He was started on IV steroids which is responding well.  Transition steroids to orals. PT OT has been consulted recommended home health PT. Overall she is much better probably home in 24 hours.  She relates her appetite is improving.  COPD: Continue inhalers appear stable.  Cerebellar ataxia with chronic dysarthria, Stable.  DVT prophylaxis: lovenox Family Communication:none Status is: Inpatient Remains inpatient appropriate because: Acute COVID-19 infection    Code Status:     Code Status Orders  (From admission, onward)           Start     Ordered   06/29/23 1741  Full code  Continuous       Question:  By:  Answer:  Consent: discussion documented in EHR   06/29/23 1741           Code Status History     Date Active Date Inactive Code Status Order ID Comments User Context   10/26/2014 1901 10/31/2014 1351 Full Code [id number]  Violeta Gelinas, MD Inpatient   10/26/2014 1420 10/26/2014 1901 Full Code [id number]  Senaida Ores, PA-C ED         IV Access:   Peripheral IV   Procedures and diagnostic studies:   DG Chest Portable 1 View  Result Date: 06/29/2023 CLINICAL DATA:  Shortness of breath EXAM: PORTABLE CHEST 1 VIEW COMPARISON:  X-ray 09/15/2022 and CTA FINDINGS: Overlapping cardiac leads. Tiny left effusion versus pleural  thickening. Left mid to lower lung areas of scarring or atelectatic change. No consolidation or edema. Normal cardiopericardial silhouette. No pneumothorax. Degenerative changes of the spine IMPRESSION: Tiny left effusion versus pleural thickening with the adjacent areas of scarring or atelectatic change. Electronically Signed   By: Karen Kays M.D.   On: 06/29/2023 13:37     Medical Consultants:   None.   Subjective:    Sherry Zimmerman no complaints feels great  Objective:    Vitals:   06/30/23 1216 06/30/23 1944 07/01/23 0619 07/01/23 0756  BP: (!) 156/89 129/80 135/72   Pulse: (!) 108 (!) 110 84   Resp: 18 18 20    Temp: 97.8 F (36.6 C) 98.2 F (36.8 C) 97.7 F (36.5 C)   TempSrc: Oral Oral Oral   SpO2: 91% 92% 92% 94%  Weight:      Height:       SpO2: 94 %   Intake/Output Summary (Last 24 hours) at 07/01/2023 1027 Last data filed at 07/01/2023 0900 Gross per 24 hour  Intake 240 ml  Output --  Net 240 ml   Filed Weights   06/29/23 1235  Weight: 59 kg    Exam: General exam: In no acute distress. Respiratory system: Good air movement and clear to auscultation. Cardiovascular system: S1 & S2 heard, RRR. No JVD. Gastrointestinal system: Abdomen is nondistended, soft and nontender.  Extremities: No pedal edema.  Skin: No rashes, lesions or ulcers Psychiatry: Judgement and insight appear normal. Mood & affect appropriate.    Data Reviewed:    Labs: Basic Metabolic Panel: Recent Labs  Lab 06/29/23 1324 06/29/23 1355  NA 140 139  K 4.2 4.1  CL 103  --   CO2 27  --   GLUCOSE 97  --   BUN 16  --   CREATININE 0.63  --   CALCIUM 9.3  --   MG 2.0  --    GFR Estimated Creatinine Clearance: 62.7 mL/min (by C-G formula based on SCr of 0.63 mg/dL). Liver Function Tests: Recent Labs  Lab 06/29/23 1324  AST 33  ALT 27  ALKPHOS 100  BILITOT 0.4  PROT 6.9  ALBUMIN 3.7   No results for input(s): "LIPASE", "AMYLASE" in the last 168 hours. No results  for input(s): "AMMONIA" in the last 168 hours. Coagulation profile No results for input(s): "INR", "PROTIME" in the last 168 hours. COVID-19 Labs  No results for input(s): "DDIMER", "FERRITIN", "LDH", "CRP" in the last 72 hours.  Lab Results  Component Value Date   SARSCOV2NAA NEGATIVE 09/15/2022    CBC: Recent Labs  Lab 06/29/23 1324 06/29/23 1355  WBC 9.3  --   NEUTROABS 4.9  --   HGB 12.8 15.3*  HCT 41.4 45.0  MCV 83.8  --   PLT 412*  --    Cardiac Enzymes: No results for input(s): "CKTOTAL", "CKMB", "CKMBINDEX", "TROPONINI" in the last 168 hours. BNP (last 3 results) No results for input(s): "PROBNP" in the last 8760 hours. CBG: No results for input(s): "GLUCAP" in the last 168 hours. D-Dimer: No results for input(s): "DDIMER" in the last 72 hours. Hgb A1c: No results for input(s): "HGBA1C" in the last 72 hours. Lipid Profile: No results for input(s): "CHOL", "HDL", "LDLCALC", "TRIG", "CHOLHDL", "LDLDIRECT" in the last 72 hours. Thyroid function studies: No results for input(s): "TSH", "T4TOTAL", "T3FREE", "THYROIDAB" in the last 72 hours.  Invalid input(s): "FREET3" Anemia work up: No results for input(s): "VITAMINB12", "FOLATE", "FERRITIN", "TIBC", "IRON", "RETICCTPCT" in the last 72 hours. Sepsis Labs: Recent Labs  Lab 06/29/23 1324 06/29/23 1403 06/29/23 1639  WBC 9.3  --   --   LATICACIDVEN  --  1.5 3.1*   Microbiology No results found for this or any previous visit (from the past 240 hour(s)).   Medications:    arformoterol  15 mcg Nebulization BID   And   umeclidinium bromide  1 puff Inhalation Daily   atorvastatin  10 mg Oral QHS   enoxaparin (LOVENOX) injection  40 mg Subcutaneous Q24H   feeding supplement  237 mL Oral BID BM   methylPREDNISolone (SOLU-MEDROL) injection  1 mg/kg Intravenous Q12H   Followed by   Melene Muller ON 07/02/2023] predniSONE  50 mg Oral Q breakfast   multivitamin with minerals  1 tablet Oral Daily   oxybutynin  5 mg  Oral BID   sodium chloride flush  3 mL Intravenous Q12H   venlafaxine XR  225 mg Oral Q breakfast   Continuous Infusions:    LOS: 1 day   Marinda Elk  [facility]  07/01/2023, 10:27 AM

## 2023-07-01 NOTE — TOC Initial Note (Addendum)
Transition of Care [facility]) - Initial/Assessment Note    Patient Details  Name: Sherry Zimmerman MRN: [id number] Date of Birth: [birth year]  Transition of Care [facility]) CM/SW Contact:    Otelia Santee, LCSW Phone Number: 07/01/2023, 2:34 PM  Clinical Narrative:                 Spoke with pt via t/c to room and confirmed plan to return home with home health at discharge. Pt shares she has not had HH in the past and currently does not have preference for agency. CSW currently seeking HHA for [facility] services.   ADDENDUM: HHPT/OT arranged with [facility]. HH order will need to be placed prior to discharge.   Expected Discharge Plan: Home w Home Health Services Barriers to Discharge: No Barriers Identified   Patient Goals and CMS Choice Patient states their goals for this hospitalization and ongoing recovery are:: To go home CMS Medicare.gov Compare Post Acute Care list provided to:: Patient Choice offered to / list presented to : Patient [place] ownership interest in [facility].provided to:: Patient    Expected Discharge Plan and Services In-house Referral: Clinical Social Work Discharge Planning Services: NA Post Acute Care Choice: Home Health Living arrangements for the past 2 months: Single Family Home                 DME Arranged: N/A DME Agency: NA                  Prior Living Arrangements/Services Living arrangements for the past 2 months: Single Family Home Lives with:: Spouse Patient language and need for interpreter reviewed:: Yes Do you feel safe going back to the place where you live?: Yes      Need for Family Participation in Patient Care: No (Comment) Care giver support system in place?: No (comment)   Criminal Activity/Legal Involvement Pertinent to Current Situation/Hospitalization: No - Comment as needed  Activities of Daily Living Home Assistive Devices/Equipment: Eyeglasses, Dan Humphreys (specify type) ADL Screening (condition at time of  admission) Patient's cognitive ability adequate to safely complete daily activities?: Yes Is the patient deaf or have difficulty hearing?: No Does the patient have difficulty seeing, even when wearing glasses/contacts?: No Does the patient have difficulty concentrating, remembering, or making decisions?: No Patient able to express need for assistance with ADLs?: Yes Does the patient have difficulty dressing or bathing?: No Independently performs ADLs?: Yes (appropriate for developmental age) Does the patient have difficulty walking or climbing stairs?: Yes Weakness of Legs: None Weakness of Arms/Hands: None  Permission Sought/Granted   Permission granted to share information with : No              Emotional Assessment   Attitude/Demeanor/Rapport: Engaged Affect (typically observed): Accepting Orientation: : Oriented to Self, Oriented to Place, Oriented to  Time, Oriented to Situation Alcohol / Substance Use: Not Applicable Psych Involvement: No (comment)  Admission diagnosis:  COVID-19 [U07.1] Patient Active Problem List   Diagnosis Date Noted   COVID-19 06/29/2023   FTT (failure to thrive) in adult 06/29/2023   Generalized weakness 06/29/2023   Osteoporosis 07/08/2021   Former smoker 07/08/2021   On statin therapy due to risk of future cardiovascular event 11/18/2019   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 [place]  [place] [address] Phone: [phone] Fax: [phone]     Social Determinants of Health (SDOH) Social History: SDOH Screenings   Food Insecurity: No Food Insecurity (06/29/2023)  Housing: Low Risk  (06/29/2023)  Transportation Needs: No Transportation Needs (06/29/2023)  Utilities: Not At Risk (06/29/2023)  Depression (PHQ2-9): Low Risk  (12/23/2022)  Financial Resource Strain: Low Risk  (08/22/2022)  Physical Activity: Inactive (08/22/2022)  Social Connections: Moderately Isolated (08/22/2022)  Stress: No Stress Concern Present (08/22/2022)  Tobacco Use: High Risk (06/29/2023)   SDOH Interventions:     Readmission Risk Interventions    07/01/2023    2:32 PM  Readmission Risk Prevention Plan  Post Dischage Appt Complete  Medication Screening Complete  Transportation Screening Complete

## 2023-07-02 DIAGNOSIS — U071 COVID-19: Secondary | ICD-10-CM | POA: Diagnosis not present

## 2023-07-02 MED ORDER — PREDNISONE 10 MG PO TABS: ORAL_TABLET | ORAL | 0 refills | Status: DC

## 2023-07-02 MED ORDER — ALBUTEROL SULFATE HFA 108 (90 BASE) MCG/ACT IN AERS
1.0000 | INHALATION_SPRAY | RESPIRATORY_TRACT | 2 refills | Status: AC | PRN
Start: 2023-07-02 — End: ?

## 2023-07-02 NOTE — Discharge Summary (Signed)
Physician Discharge Summary  Sherry Zimmerman VPX:[id number] DOB: 1956-04-18 DOA: 06/29/2023  PCP: Willow Ora, MD  Admit date: 06/29/2023 Discharge date: 07/02/2023  Admitted From: Home Disposition:  Home  Recommendations for Outpatient Follow-up:  Follow up with PCP in 1-2 weeks Please obtain BMP/CBC in one week   Home Health:No Equipment/Devices:None  Discharge Condition:Stable CODE STATUS:Full Diet recommendation: Heart Healthy  Brief/Interim Summary:  67 y.o. female past medical history of COPD, cerebellar ataxia went to his PCP for 2 weeks of fatigue weight loss poor appetite and shortness of breath COVID test was positive was satting 93% on room air referred to the ED   Discharge Diagnoses:  Principal Problem:   COVID-19 Active Problems:   Familial cerebellar ataxia (HCC)   Emphysema of lung (HCC)   FTT (failure to thrive) in adult   Generalized weakness  Acute respiratory failure without hypoxia secondary to COVID-19/failure to thrive: She was having greater than 90% on room air. It is unclear when COVID started. She was started on IV steroid she was not a candidate for remdesivir. Physical therapy evaluated the patient recommended home health PT. She was transition to oral steroids and her strength improved.  COPD: Continue inhalers she has been counseled on quitting smoking.  Cerebellar ataxia with chronic dysarthria: Stable.  Discharge Instructions  Discharge Instructions     Diet - low sodium heart healthy   Complete by: As directed    Increase activity slowly   Complete by: As directed       Allergies as of 07/02/2023       Reactions   Morphine And Codeine Other (See Comments)   Reactions not recalled by the patient   Sulfa Antibiotics Nausea And Vomiting, Other (See Comments)   "Made me sick"        Medication List     STOP taking these medications    Aleve 220 MG tablet Generic drug: naproxen sodium       TAKE these  medications    albuterol 108 (90 Base) MCG/ACT inhaler Commonly known as: VENTOLIN HFA Inhale 1-2 puffs into the lungs every 4 (four) hours as needed for wheezing or shortness of breath. What changed: See the new instructions.   alendronate 70 MG tablet Commonly known as: FOSAMAX Take 1 tablet (70 mg total) by mouth every 7 (seven) days. Take with a full glass of water on an empty stomach. Sit upright for 30 minutes   atorvastatin 10 MG tablet Commonly known as: LIPITOR Take 1 tablet (10 mg total) by mouth at bedtime.   Bevespi Aerosphere 9-4.8 MCG/ACT Aero Generic drug: Glycopyrrolate-Formoterol Inhale 2 puffs into the lungs 2 (two) times daily.   oxybutynin 5 MG tablet Commonly known as: DITROPAN Take 1 tablet (5 mg total) by mouth 2 (two) times daily.   predniSONE 10 MG tablet Commonly known as: DELTASONE Takes  3 tablets for 1 days, then 2 tabs for 1 days, then 1 tab for 1 days, and then stop.   rizatriptan 10 MG tablet Commonly known as: MAXALT Take 10 mg by mouth once as needed for migraine (may repeat once in 2 hours, if no relief).   TYLENOL 500 MG tablet Generic drug: acetaminophen Take 500-1,000 mg by mouth every 6 (six) hours as needed for mild pain or headache.   valACYclovir 1000 MG tablet Commonly known as: Valtrex Take 2 tablets (2,000 mg total) by mouth 2 (two) times daily. Once, as needed for cold sores   venlafaxine XR 150 MG 24  hr capsule Commonly known as: EFFEXOR-XR TAKE 1 CAPSULE BY MOUTH AT BEDTIME **TAKE  WITH  75MG   CAPSULES  FOR  225MG   TOTAL  DAILY  DOSE What changed:  how much to take how to take this when to take this additional instructions   venlafaxine XR 75 MG 24 hr capsule Commonly known as: EFFEXOR-XR TAKE 1 CAPSULE BY MOUTH ONCE DAILY **TAKE  WITH  150MG   CAPSULES  FOR  225MG   TOTAL  DAILY  DOSE What changed:  how much to take how to take this when to take this additional instructions        Follow-up Information      [facility] Follow up.   Why: [facility] will follow up with you at discharge to provide home health services.               Allergies  Allergen Reactions   Morphine And Codeine Other (See Comments)    Reactions not recalled by the patient   Sulfa Antibiotics Nausea And Vomiting and Other (See Comments)    "Made me sick"    Consultations: None   Procedures/Studies: DG Chest Portable 1 View  Result Date: 06/29/2023 CLINICAL DATA:  Shortness of breath EXAM: PORTABLE CHEST 1 VIEW COMPARISON:  X-ray 09/15/2022 and CTA FINDINGS: Overlapping cardiac leads. Tiny left effusion versus pleural thickening. Left mid to lower lung areas of scarring or atelectatic change. No consolidation or edema. Normal cardiopericardial silhouette. No pneumothorax. Degenerative changes of the spine IMPRESSION: Tiny left effusion versus pleural thickening with the adjacent areas of scarring or atelectatic change. Electronically Signed   By: Karen Kays M.D.   On: 06/29/2023 13:37   (Echo, Carotid, EGD, Colonoscopy, ERCP)    Subjective: No complaints she relates her strength has improved as well as her appetite.  Discharge Exam: Vitals:   07/02/23 0549 07/02/23 0804  BP: 124/88   Pulse: 82   Resp: 16   Temp: 97.7 F (36.5 C)   SpO2: 94% 93%   Vitals:   07/01/23 0756 07/01/23 1225 07/02/23 0549 07/02/23 0804  BP:  123/64 124/88   Pulse:  96 82   Resp:   16   Temp:  97.7 F (36.5 C) 97.7 F (36.5 C)   TempSrc:  Oral Oral   SpO2: 94% 91% 94% 93%  Weight:      Height:        General: Pt is alert, awake, not in acute distress Cardiovascular: RRR, S1/S2 +, no rubs, no gallops Respiratory: CTA bilaterally, no wheezing, no rhonchi Abdominal: Soft, NT, ND, bowel sounds + Extremities: no edema, no cyanosis    The results of significant diagnostics from this hospitalization (including imaging, microbiology, ancillary and laboratory) are listed below for reference.      Microbiology: No results found for this or any previous visit (from the past 240 hour(s)).   Labs: BNP (last 3 results) Recent Labs    09/15/22 1325  BNP 63.8   Basic Metabolic Panel: Recent Labs  Lab 06/29/23 1324 06/29/23 1355  NA 140 139  K 4.2 4.1  CL 103  --   CO2 27  --   GLUCOSE 97  --   BUN 16  --   CREATININE 0.63  --   CALCIUM 9.3  --   MG 2.0  --    Liver Function Tests: Recent Labs  Lab 06/29/23 1324  AST 33  ALT 27  ALKPHOS 100  BILITOT 0.4  PROT 6.9  ALBUMIN  3.7   No results for input(s): "LIPASE", "AMYLASE" in the last 168 hours. No results for input(s): "AMMONIA" in the last 168 hours. CBC: Recent Labs  Lab 06/29/23 1324 06/29/23 1355  WBC 9.3  --   NEUTROABS 4.9  --   HGB 12.8 15.3*  HCT 41.4 45.0  MCV 83.8  --   PLT 412*  --    Cardiac Enzymes: No results for input(s): "CKTOTAL", "CKMB", "CKMBINDEX", "TROPONINI" in the last 168 hours. BNP: Invalid input(s): "POCBNP" CBG: No results for input(s): "GLUCAP" in the last 168 hours. D-Dimer No results for input(s): "DDIMER" in the last 72 hours. Hgb A1c No results for input(s): "HGBA1C" in the last 72 hours. Lipid Profile No results for input(s): "CHOL", "HDL", "LDLCALC", "TRIG", "CHOLHDL", "LDLDIRECT" in the last 72 hours. Thyroid function studies No results for input(s): "TSH", "T4TOTAL", "T3FREE", "THYROIDAB" in the last 72 hours.  Invalid input(s): "FREET3" Anemia work up No results for input(s): "VITAMINB12", "FOLATE", "FERRITIN", "TIBC", "IRON", "RETICCTPCT" in the last 72 hours. Urinalysis    Component Value Date/Time   COLORURINE STRAW (A) 09/15/2022 1451   APPEARANCEUR CLEAR 09/15/2022 1451   LABSPEC 1.011 09/15/2022 1451   PHURINE 6.0 09/15/2022 1451   GLUCOSEU NEGATIVE 09/15/2022 1451   HGBUR SMALL (A) 09/15/2022 1451   BILIRUBINUR NEGATIVE 09/15/2022 1451   BILIRUBINUR Negative 08/29/2019 1127   KETONESUR NEGATIVE 09/15/2022 1451   PROTEINUR NEGATIVE  09/15/2022 1451   UROBILINOGEN 0.2 08/29/2019 1127   NITRITE NEGATIVE 09/15/2022 1451   LEUKOCYTESUR SMALL (A) 09/15/2022 1451   Sepsis Labs Recent Labs  Lab 06/29/23 1324  WBC 9.3   Microbiology No results found for this or any previous visit (from the past 240 hour(s)).    SIGNED:   Marinda Elk, MD  [facility] 07/02/2023, 10:36 AM Pager   If 7PM-7AM, please contact night-coverage [email or web address] Password [email or web address]

## 2023-07-02 NOTE — Plan of Care (Signed)
  Problem: Health Behavior/Discharge Planning: Goal: Ability to manage health-related needs will improve Outcome: Progressing   Problem: Activity: Goal: Risk for activity intolerance will decrease Outcome: Progressing   Problem: Nutrition: Goal: Adequate nutrition will be maintained Outcome: Progressing   Problem: Coping: Goal: Level of anxiety will decrease Outcome: Progressing   Problem: Pain Managment: Goal: General experience of comfort will improve Outcome: Progressing   Problem: Safety: Goal: Ability to remain free from injury will improve Outcome: Progressing

## 2023-07-03 ENCOUNTER — Encounter: Payer: Self-pay | Admitting: *Deleted

## 2023-07-03 ENCOUNTER — Telehealth: Payer: Self-pay | Admitting: *Deleted

## 2023-07-03 NOTE — Transitions of Care (Post Inpatient/ED Visit) (Signed)
   07/03/2023  Name: Sherry Zimmerman MRN: [id number] DOB: [birth year]  Today's TOC FU Call Status: Today's TOC FU Call Status:: Unsuccessul Call (1st Attempt) Unsuccessful Call (1st Attempt) Date: 07/03/23  Attempted to reach the patient regarding the most recent Inpatient visit; left HIPAA compliant voice message requesting call back  Follow Up Plan: Additional outreach attempts will be made to reach the patient to complete the Transitions of Care (Post Inpatient visit) call.   Caryl Pina, RN, BSN, CCRN Alumnus RN CM Care Coordination/ Transition of Care- [facility] Care Management [phone]: direct office

## 2023-07-06 ENCOUNTER — Telehealth: Payer: Self-pay | Admitting: *Deleted

## 2023-07-06 ENCOUNTER — Encounter: Payer: Self-pay | Admitting: *Deleted

## 2023-07-06 NOTE — Transitions of Care (Post Inpatient/ED Visit) (Signed)
07/06/2023  Name: Sherry Zimmerman MRN: [id number] DOB: 1956/03/01  Today's TOC FU Call Status: Today's TOC FU Call Status:: Successful TOC FU Call Competed TOC FU Call Complete Date: 07/06/23  Transition Care Management Follow-up Telephone Call Date of Discharge: 07/02/23 Discharge Facility: [facility]) Type of Discharge: Inpatient Admission Primary Inpatient Discharge Diagnosis:: COVID How have you been since you were released from the hospital?: Same ("I am doing a little better I guess, but I am still so weak.  I have not heard from the Home Health PT yet.  I am using the albuterol inhaler, I think I need a refill on the other inhaler") Any questions or concerns?: No  Items Reviewed: Did you receive and understand the discharge instructions provided?: Yes (thoroughly reviewed with patient who verbalizes fair understanding of same- patient seems confused around maintenance vs. rescue inhalers- education provided) Medications obtained,verified, and reconciled?: Yes (Medications Reviewed) Any new allergies since your discharge?: No Dietary orders reviewed?: Yes Type of Diet Ordered:: "Regular" Do you have support at home?: Yes People in Home: significant other Name of Support/Comfort Primary Source: Reports independent in self-care activities; supportive boyfriend assists as/ if needed/ indicated  Medications Reviewed Today: Medications Reviewed Today     Reviewed by Michaela Corner, RN (Registered Nurse) on 07/06/23 at 1149  Med List Status: <None>   Medication Order Taking? Sig Documenting Provider Last Dose Status Informant  albuterol (VENTOLIN HFA) 108 (90 Base) MCG/ACT inhaler [id number] Yes Inhale 1-2 puffs into the lungs every 4 (four) hours as needed for wheezing or shortness of breath. Marinda Elk, MD Taking Active   alendronate (FOSAMAX) 70 MG tablet [id number] No Take 1 tablet (70 mg total) by mouth every 7 (seven) days. Take with a full glass of water on an  empty stomach. Sit upright for 30 minutes  Patient not taking: Reported on 06/29/2023   Willow Ora, MD Not Taking Active Self  atorvastatin (LIPITOR) 10 MG tablet [id number] Yes Take 1 tablet (10 mg total) by mouth at bedtime. Willow Ora, MD Taking Active Self  Glycopyrrolate-Formoterol (BEVESPI AEROSPHERE) 9-4.8 MCG/ACT Sandrea Matte [id number] No Inhale 2 puffs into the lungs 2 (two) times daily.  Patient not taking: Reported on 07/06/2023   Willow Ora, MD Not Taking Active Self           Med Note Marilu Favre Jul 06, 2023 11:22 AM) 07/06/23: Reports during [facility] call that she has run out of this medication, need new Rx per patient report  oxybutynin (DITROPAN) 5 MG tablet [id number] Yes Take 1 tablet (5 mg total) by mouth 2 (two) times daily. Willow Ora, MD Taking Active Self  predniSONE (DELTASONE) 10 MG tablet [id number] Yes Takes  3 tablets for 1 days, then 2 tabs for 1 days, then 1 tab for 1 days, and then stop. Marinda Elk, MD Taking Active   rizatriptan (MAXALT) 10 MG tablet [id number] Yes Take 10 mg by mouth once as needed for migraine (may repeat once in 2 hours, if no relief). [provider] Taking Active Self  TYLENOL 500 MG tablet [id number] Yes Take 500-1,000 mg by mouth every 6 (six) hours as needed for mild pain or headache. [provider] Taking Active Self  valACYclovir (VALTREX) 1000 MG tablet [id number] Yes Take 2 tablets (2,000 mg total) by mouth 2 (two) times daily. Once, as needed for cold sores Willow Ora, MD Taking Active Self  venlafaxine XR (EFFEXOR-XR) 150 MG  24 hr capsule [id number] Yes TAKE 1 CAPSULE BY MOUTH AT BEDTIME **TAKE  WITH  75MG   CAPSULES  FOR  225MG   TOTAL  DAILY  DOSE  Patient taking differently: Take 150 mg by mouth daily with breakfast.   Willow Ora, MD Taking Active Self  venlafaxine XR (EFFEXOR-XR) 75 MG 24 hr capsule [id number] Yes TAKE 1 CAPSULE BY MOUTH ONCE DAILY **TAKE  WITH  150MG   CAPSULES  FOR  225MG    TOTAL  DAILY  DOSE  Patient taking differently: Take 75 mg by mouth daily with breakfast.   Willow Ora, MD Taking Active Self           Home Care and Equipment/Supplies: Were Home Health Services Ordered?: Yes Name of Home Health Agency:: [facility] [phone] Has Agency set up a time to come to your home?: No EMR reviewed for Home Health Orders: Orders present/patient has not received call (refer to CM for follow-up) (care coordination outreach to home health agency-- they report they have pending orders; verified for Darnell that patient has been discharged and that it appears hospitalist placed orders to approve-- she will initiate outreach to patient) Any new equipment or medical supplies ordered?: No  Functional Questionnaire: Do you need assistance with bathing/showering or dressing?: Yes (reports her boyfriend assists as needed) Do you need assistance with meal preparation?: Yes (reports her boyfriend assists as needed) Do you need assistance with eating?: No Do you have difficulty maintaining continence: No Do you need assistance with getting out of bed/getting out of a chair/moving?: No Do you have difficulty managing or taking your medications?: No  Follow up appointments reviewed: PCP Follow-up appointment confirmed?: Yes (care coordination outreach in real-time with scheduling care guide to successfully schedule hospital follow up PCP appointment 07/13/23) Date of PCP follow-up appointment?: 07/13/23 Follow-up Provider: covering NP provider for PCP Specialist Hospital Follow-up appointment confirmed?: NA (verified not indicated per hospital discharging provider discharge notes) Do you need transportation to your follow-up appointment?: No Do you understand care options if your condition(s) worsen?: Yes-patient verbalized understanding  SDOH Interventions Today    Flowsheet Row Most Recent Value  SDOH Interventions   Food Insecurity Interventions Intervention Not  Indicated  Transportation Interventions Intervention Not Indicated  [reports her boyfriend provides all transportation]      TOC Interventions Today    Flowsheet Row Most Recent Value  TOC Interventions   TOC Interventions Discussed/Reviewed TOC Interventions Discussed, Contacted Home Health RN/OT/PT, Arranged PCP follow up less than 12 days/Care Guide scheduled  [Care coordination outreach with home health agency]      Interventions Today    Flowsheet Row Most Recent Value  Chronic Disease   Chronic disease during today's visit Other  [severe COVID]  General Interventions   General Interventions Discussed/Reviewed General Interventions Discussed, Durable Medical Equipment (DME), Doctor Visits, Referral to Nurse, Communication with  [scheduled with RN CM Care Coordinator for follow up telephone visit on 07/17/23]  Doctor Visits Discussed/Reviewed Doctor Visits Discussed, PCP  Durable Medical Equipment (DME) Dan Humphreys  PCP/Specialist Visits Compliance with follow-up visit  Communication with PCP/Specialists, RN  Exercise Interventions   Exercise Discussed/Reviewed Exercise Discussed  [facility] PT-- encouraged her active participation,  provided direct number to agency]  Education Interventions   Education Provided Provided Education  Provided Verbal Education On Medication, When to see the doctor, Other  [need to not lie around in bed all day as per patient report,  need to pace activities,  difference between rescue and maintenance  inhalers]  Nutrition Interventions   Nutrition Discussed/Reviewed Nutrition Discussed  Pharmacy Interventions   Pharmacy Dicussed/Reviewed Pharmacy Topics Discussed  [Full medication review with updating medication list in EHR per patient report]  Safety Interventions   Safety Discussed/Reviewed Safety Discussed      Caryl Pina, RN, BSN, CCRN Alumnus RN CM Care Coordination/ Transition of Care- [facility] Care Management [phone]: direct  office

## 2023-07-07 DIAGNOSIS — G3281 Cerebellar ataxia in diseases classified elsewhere: Secondary | ICD-10-CM | POA: Diagnosis not present

## 2023-07-07 DIAGNOSIS — U071 COVID-19: Secondary | ICD-10-CM | POA: Diagnosis not present

## 2023-07-07 DIAGNOSIS — J9601 Acute respiratory failure with hypoxia: Secondary | ICD-10-CM | POA: Diagnosis not present

## 2023-07-07 DIAGNOSIS — R627 Adult failure to thrive: Secondary | ICD-10-CM | POA: Diagnosis not present

## 2023-07-09 ENCOUNTER — Telehealth: Payer: Self-pay | Admitting: Family Medicine

## 2023-07-09 NOTE — Telephone Encounter (Signed)
Home Health Verbal Orders  Agency:  [facility]  Caller: Armed forces logistics/support/administrative officer and title  Requesting  PT    Reason for Request:  Pt was in hospital and needs PT for strengthening, gait and balance , 2 times a week for 3 weeks, then 1 time a week for 2 weeks  Frequency:    HH needs F2F w/in last 30 days     [phone]

## 2023-07-09 NOTE — Telephone Encounter (Signed)
Spoke with Trish to move forward with verbal orders

## 2023-07-13 ENCOUNTER — Ambulatory Visit: Payer: Medicare Other | Admitting: Family

## 2023-07-13 ENCOUNTER — Ambulatory Visit: Payer: Medicare Other | Admitting: Family Medicine

## 2023-07-13 VITALS — BP 121/77 | HR 107 | Temp 97.8°F | Ht 68.0 in | Wt 122.5 lb

## 2023-07-13 DIAGNOSIS — J431 Panlobular emphysema: Secondary | ICD-10-CM

## 2023-07-13 NOTE — Patient Instructions (Signed)
It was very nice to see you today!   Glad you are better. But because you are still SOB and not coughing up mucus, I think you need a different inhaler. So I have sent a new referral to the Pulmonology office at [facility] [address].  Try to drink more water - shooting for 8 cups daily!  Continue to work on kicking the smoking habit for good!     PLEASE NOTE:  If you had any lab tests please let us know if you have not heard back within a few days. You may see your results on MyChart before we have a chance to review them but we will give you a call once they are reviewed by [place]. If we ordered any referrals today, please let us know if you have not heard from their office within the next week.

## 2023-07-13 NOTE — Progress Notes (Signed)
Patient ID: Sherry Zimmerman, female    DOB: [birth year], 67 y.o.   MRN: [id number]  Chief Complaint  Patient presents with   Follow-up    d/c on7/25    HPI: Hospital f/u:  Pt was sent to ED on 7/22 by Dr. Jon Billings due to testing positive for covid and sx of nausea, lack of appetite, cough, labored breathing, and extreme fatigue. Pt states she is feeling better but still has SOB even with short or mild exertion.  Using Bevespi 2-3times per day, 2 puffs, and given Albuterol at discharge. Reports she is still smoking, down to 6 cigs per day.   Assessment & Plan:  Panlobular emphysema (HCC)- lungs decreased today; advised pt f/u with Pulmonology, has not seen in years. Reports using the Bevespi as directed, but she has been having daily SOB even before she had Covid. Using Albuterol rescue about 3-4x/day also. Advised on complete smoking cessation. Increase water intake to 2L per day.  -     Ambulatory referral to Pulmonology   Subjective:    Outpatient Medications Prior to Visit  Medication Sig Dispense Refill   albuterol (VENTOLIN HFA) 108 (90 Base) MCG/ACT inhaler Inhale 1-2 puffs into the lungs every 4 (four) hours as needed for wheezing or shortness of breath. 9 g 2   alendronate (FOSAMAX) 70 MG tablet Take 1 tablet (70 mg total) by mouth every 7 (seven) days. Take with a full glass of water on an empty stomach. Sit upright for 30 minutes 4 tablet 11   atorvastatin (LIPITOR) 10 MG tablet Take 1 tablet (10 mg total) by mouth at bedtime. 90 tablet 3   Glycopyrrolate-Formoterol (BEVESPI AEROSPHERE) 9-4.8 MCG/ACT AERO Inhale 2 puffs into the lungs 2 (two) times daily. 10.7 g 11   oxybutynin (DITROPAN) 5 MG tablet Take 1 tablet (5 mg total) by mouth 2 (two) times daily. 180 tablet 3   rizatriptan (MAXALT) 10 MG tablet Take 10 mg by mouth once as needed for migraine (may repeat once in 2 hours, if no relief).     TYLENOL 500 MG tablet Take 500-1,000 mg by mouth every 6 (six) hours as needed  for mild pain or headache.     valACYclovir (VALTREX) 1000 MG tablet Take 2 tablets (2,000 mg total) by mouth 2 (two) times daily. Once, as needed for cold sores 20 tablet 2   venlafaxine XR (EFFEXOR-XR) 150 MG 24 hr capsule TAKE 1 CAPSULE BY MOUTH AT BEDTIME **TAKE  WITH  75MG   CAPSULES  FOR  225MG   TOTAL  DAILY  DOSE 90 capsule 3   venlafaxine XR (EFFEXOR-XR) 75 MG 24 hr capsule TAKE 1 CAPSULE BY MOUTH ONCE DAILY **TAKE  WITH  150MG   CAPSULES  FOR  225MG   TOTAL  DAILY  DOSE (Patient taking differently: Take 75 mg by mouth daily with breakfast.) 90 capsule 3   predniSONE (DELTASONE) 10 MG tablet Takes  3 tablets for 1 days, then 2 tabs for 1 days, then 1 tab for 1 days, and then stop. 6 tablet 0   No facility-administered medications prior to visit.   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   Past Surgical History:  Procedure Laterality Date   ABDOMINAL HYSTERECTOMY     CATARACT EXTRACTION Bilateral 01/17/2020   SPLENECTOMY, TOTAL N/A 10/26/2014   Procedure: SPLENECTOMY;  Surgeon: Violeta Gelinas, MD;  Location: [facility] OR;  Service: General;  Laterality: N/A;   WRIST FRACTURE SURGERY Right    Allergies  Allergen Reactions   Morphine And Codeine Other (See Comments)    Reactions not recalled by the patient   Sulfa Antibiotics Nausea And Vomiting and Other (See Comments)    "Made me sick"       Objective:    Physical Exam Vitals and nursing note reviewed.  Constitutional:      Appearance: Normal appearance.  Cardiovascular:     Rate and Rhythm: Normal rate and regular rhythm.  Pulmonary:     Effort: Pulmonary effort is normal. No tachypnea, accessory muscle usage or respiratory distress.     Breath sounds: Examination of the right-middle field reveals decreased breath sounds. Examination of the left-middle field reveals decreased breath sounds. Decreased breath sounds present.  Musculoskeletal:        General: Normal range of motion.  Skin:    General: Skin is warm and dry.  Neurological:     Mental Status: She is alert.  Psychiatric:        Mood and Affect: Mood normal.        Behavior: Behavior normal.    BP 121/77   Pulse (!) 107   Temp 97.8 F (36.6 C) (Temporal)   Ht 5\' 8"  (1.727 m)   Wt 122 lb 8 oz (55.6 kg)   SpO2 93%   BMI 18.63 kg/m  Wt Readings from Last 3 Encounters:  07/13/23 122 lb 8 oz (55.6 kg)  06/29/23 130 lb 1.1 oz (59 kg)  03/13/23 130 lb (59 kg)       Dulce Sellar, NP

## 2023-07-14 DIAGNOSIS — J9601 Acute respiratory failure with hypoxia: Secondary | ICD-10-CM | POA: Diagnosis not present

## 2023-07-14 DIAGNOSIS — G3281 Cerebellar ataxia in diseases classified elsewhere: Secondary | ICD-10-CM | POA: Diagnosis not present

## 2023-07-14 DIAGNOSIS — U071 COVID-19: Secondary | ICD-10-CM | POA: Diagnosis not present

## 2023-07-14 DIAGNOSIS — R627 Adult failure to thrive: Secondary | ICD-10-CM | POA: Diagnosis not present

## 2023-07-15 DIAGNOSIS — G3281 Cerebellar ataxia in diseases classified elsewhere: Secondary | ICD-10-CM | POA: Diagnosis not present

## 2023-07-15 DIAGNOSIS — U071 COVID-19: Secondary | ICD-10-CM | POA: Diagnosis not present

## 2023-07-15 DIAGNOSIS — J9601 Acute respiratory failure with hypoxia: Secondary | ICD-10-CM | POA: Diagnosis not present

## 2023-07-15 DIAGNOSIS — R627 Adult failure to thrive: Secondary | ICD-10-CM | POA: Diagnosis not present

## 2023-07-16 ENCOUNTER — Telehealth: Payer: Self-pay | Admitting: Family Medicine

## 2023-07-16 NOTE — Telephone Encounter (Signed)
Caller states they completed HH occupational therapy evaluation today w/ pt.  Home Health Verbal Orders  Agency:  [facility]  Caller: Durene Romans, [place] [phone]  Requesting OT/ PT/ Skilled nursing/ Social Work/ Speech:    Reason for Request:  Increase strength, endurance, and strengthen independence/safety ADLs  Frequency:  1 x 5 weeks   HH needs F2F w/in last 30 days

## 2023-07-16 NOTE — Telephone Encounter (Signed)
LVM for Sherry Zimmerman to go ahead and move forward with the verbal orders that they are requesting

## 2023-07-17 ENCOUNTER — Ambulatory Visit: Payer: Self-pay

## 2023-07-17 DIAGNOSIS — G3281 Cerebellar ataxia in diseases classified elsewhere: Secondary | ICD-10-CM | POA: Diagnosis not present

## 2023-07-17 DIAGNOSIS — R627 Adult failure to thrive: Secondary | ICD-10-CM | POA: Diagnosis not present

## 2023-07-17 DIAGNOSIS — U071 COVID-19: Secondary | ICD-10-CM | POA: Diagnosis not present

## 2023-07-17 DIAGNOSIS — J9601 Acute respiratory failure with hypoxia: Secondary | ICD-10-CM | POA: Diagnosis not present

## 2023-07-17 NOTE — Patient Instructions (Signed)
Visit Information  Thank you for taking time to visit with me today. Please don't hesitate to contact me if I can be of assistance to you.   Following are the goals we discussed today:   Goals Addressed             This Visit's Progress    Recent COVID       Patient Goals/Self Care Activities: -Patient/Caregiver will self-administer medications as prescribed as evidenced by self-report/primary caregiver report  -Patient/Caregiver will attend all scheduled provider appointments as evidenced by clinician review of documented attendance to scheduled appointments and patient/caregiver report -Patient/Caregiver will call provider office for new concerns or questions as evidenced by review of documented incoming telephone call notes and patient report  Care Coordination Interventions: Provided education to patient to enhance basic understanding of COVID-19 as a viral disease, measures to prevent exposure, signs and symptoms, recommended vaccine schedule, when to contact provider Discussed effects, symptoms, and management of "long COVID"'   Patient reports she is doing much better.  She is still some weak.  Participating with PT and OT from [facility]  Reviewed inhalers with patient.  She is using inhalers as prescribed.  Referral to pulmonology pending.  Patient states she is down to about one cigarette per day.  Discussed smoking cessation. She verbalized understanding.         Our next appointment is by telephone on 08/06/23 at 100 pm  Please call the care guide team at [phone] if you need to cancel or reschedule your appointment.   If you are experiencing a Mental Health or Behavioral Health Crisis or need someone to talk to, please call the Suicide and Crisis Lifeline: 988   Patient verbalizes understanding of instructions and care plan provided today and agrees to view in MyChart. Active MyChart status and patient understanding of how to access instructions and care plan via  MyChart confirmed with patient.     The patient has been provided with contact information for the care management team and has been advised to call with any health related questions or concerns.   Bary Leriche, RN, MSN [facility] Care Management Care Management Coordinator Direct Line [phone]

## 2023-07-17 NOTE — Patient Outreach (Signed)
  Care Coordination   Initial Visit Note   07/17/2023 Name: Sherry Zimmerman MRN: [id number] DOB: Jan 19, 1956  Sherry Zimmerman is a 67 y.o. year old female who sees Willow Ora, MD for primary care. I spoke with  Korayma Schuckman by phone today.  What matters to the patients health and wellness today?  Recent COVID    Goals Addressed             This Visit's Progress    Recent COVID       Patient Goals/Self Care Activities: -Patient/Caregiver will self-administer medications as prescribed as evidenced by self-report/primary caregiver report  -Patient/Caregiver will attend all scheduled provider appointments as evidenced by clinician review of documented attendance to scheduled appointments and patient/caregiver report -Patient/Caregiver will call provider office for new concerns or questions as evidenced by review of documented incoming telephone call notes and patient report  Care Coordination Interventions: Provided education to patient to enhance basic understanding of COVID-19 as a viral disease, measures to prevent exposure, signs and symptoms, recommended vaccine schedule, when to contact provider Discussed effects, symptoms, and management of "long COVID"'   Patient reports she is doing much better.  She is still some weak.  Participating with PT and OT from [facility].  Reviewed inhalers with patient.  She is using inhalers as prescribed.  Referral to pulmonology pending.  Patient states she is down to about one cigarette per day.  Discussed smoking cessation. She verbalized understanding.         SDOH assessments and interventions completed:  Yes   SDOH Interventions Today    Flowsheet Row Most Recent Value  SDOH Interventions   Food Insecurity Interventions Intervention Not Indicated  Transportation Interventions Intervention Not Indicated        Care Coordination Interventions:  Yes, provided   Follow up plan: Follow up call scheduled for August    Encounter  Outcome:  Pt. Visit Completed   Bary Leriche, RN, MSN [facility] Care Management Care Management Coordinator Direct Line [phone]

## 2023-07-21 ENCOUNTER — Telehealth: Payer: Self-pay | Admitting: Family Medicine

## 2023-07-21 DIAGNOSIS — U071 COVID-19: Secondary | ICD-10-CM | POA: Diagnosis not present

## 2023-07-21 DIAGNOSIS — R627 Adult failure to thrive: Secondary | ICD-10-CM | POA: Diagnosis not present

## 2023-07-21 DIAGNOSIS — J9601 Acute respiratory failure with hypoxia: Secondary | ICD-10-CM | POA: Diagnosis not present

## 2023-07-21 DIAGNOSIS — G3281 Cerebellar ataxia in diseases classified elsewhere: Secondary | ICD-10-CM | POA: Diagnosis not present

## 2023-07-21 NOTE — Telephone Encounter (Signed)
Patient dropped off document Home Health Certificate (Order ID [id number]), to be filled out by provider. Patient requested to send it back via Fax within 5-days. Document is located in providers tray at front office.Please advise

## 2023-07-22 DIAGNOSIS — J9601 Acute respiratory failure with hypoxia: Secondary | ICD-10-CM | POA: Diagnosis not present

## 2023-07-22 DIAGNOSIS — G3281 Cerebellar ataxia in diseases classified elsewhere: Secondary | ICD-10-CM | POA: Diagnosis not present

## 2023-07-22 DIAGNOSIS — R627 Adult failure to thrive: Secondary | ICD-10-CM | POA: Diagnosis not present

## 2023-07-22 DIAGNOSIS — U071 COVID-19: Secondary | ICD-10-CM | POA: Diagnosis not present

## 2023-07-22 NOTE — Telephone Encounter (Signed)
Form has been placed in providers box 

## 2023-07-23 ENCOUNTER — Encounter (INDEPENDENT_AMBULATORY_CARE_PROVIDER_SITE_OTHER): Payer: Self-pay

## 2023-07-23 DIAGNOSIS — U071 COVID-19: Secondary | ICD-10-CM | POA: Diagnosis not present

## 2023-07-23 DIAGNOSIS — G3281 Cerebellar ataxia in diseases classified elsewhere: Secondary | ICD-10-CM | POA: Diagnosis not present

## 2023-07-23 DIAGNOSIS — R627 Adult failure to thrive: Secondary | ICD-10-CM | POA: Diagnosis not present

## 2023-07-23 DIAGNOSIS — J9601 Acute respiratory failure with hypoxia: Secondary | ICD-10-CM | POA: Diagnosis not present

## 2023-07-24 DIAGNOSIS — Z9181 History of falling: Secondary | ICD-10-CM | POA: Diagnosis not present

## 2023-07-24 DIAGNOSIS — U071 COVID-19: Secondary | ICD-10-CM | POA: Diagnosis not present

## 2023-07-24 DIAGNOSIS — G3281 Cerebellar ataxia in diseases classified elsewhere: Secondary | ICD-10-CM | POA: Diagnosis not present

## 2023-07-24 DIAGNOSIS — J449 Chronic obstructive pulmonary disease, unspecified: Secondary | ICD-10-CM | POA: Diagnosis not present

## 2023-07-24 DIAGNOSIS — R627 Adult failure to thrive: Secondary | ICD-10-CM | POA: Diagnosis not present

## 2023-07-24 DIAGNOSIS — D500 Iron deficiency anemia secondary to blood loss (chronic): Secondary | ICD-10-CM | POA: Diagnosis not present

## 2023-07-24 DIAGNOSIS — J9601 Acute respiratory failure with hypoxia: Secondary | ICD-10-CM | POA: Diagnosis not present

## 2023-07-24 DIAGNOSIS — M810 Age-related osteoporosis without current pathological fracture: Secondary | ICD-10-CM | POA: Diagnosis not present

## 2023-07-24 DIAGNOSIS — F339 Major depressive disorder, recurrent, unspecified: Secondary | ICD-10-CM

## 2023-07-24 DIAGNOSIS — I7300 Raynaud's syndrome without gangrene: Secondary | ICD-10-CM | POA: Diagnosis not present

## 2023-07-24 DIAGNOSIS — F419 Anxiety disorder, unspecified: Secondary | ICD-10-CM

## 2023-07-24 DIAGNOSIS — F17210 Nicotine dependence, cigarettes, uncomplicated: Secondary | ICD-10-CM | POA: Diagnosis not present

## 2023-07-24 NOTE — Telephone Encounter (Signed)
Faxed forms on 07/24/23

## 2023-07-28 ENCOUNTER — Encounter: Payer: Self-pay | Admitting: Family Medicine

## 2023-07-28 DIAGNOSIS — J9601 Acute respiratory failure with hypoxia: Secondary | ICD-10-CM | POA: Diagnosis not present

## 2023-07-28 DIAGNOSIS — G3281 Cerebellar ataxia in diseases classified elsewhere: Secondary | ICD-10-CM | POA: Diagnosis not present

## 2023-07-28 DIAGNOSIS — R627 Adult failure to thrive: Secondary | ICD-10-CM | POA: Diagnosis not present

## 2023-07-28 DIAGNOSIS — U071 COVID-19: Secondary | ICD-10-CM | POA: Diagnosis not present

## 2023-07-29 ENCOUNTER — Other Ambulatory Visit: Payer: Self-pay | Admitting: Family

## 2023-07-29 MED ORDER — OYSTER SHELL CALCIUM/D3 500-5 MG-MCG PO TABS: 1.0000 | ORAL_TABLET | Freq: Every day | ORAL | 1 refills | Status: DC

## 2023-07-29 NOTE — Telephone Encounter (Signed)
Please see pt msg and request and advise any other recommendations for patient?

## 2023-07-30 ENCOUNTER — Ambulatory Visit: Payer: Self-pay

## 2023-07-30 ENCOUNTER — Telehealth: Payer: Self-pay | Admitting: Family Medicine

## 2023-07-30 NOTE — Patient Instructions (Signed)
Visit Information  Thank you for taking time to visit with me today. Please don't hesitate to contact me if I can be of assistance to you.   Following are the goals we discussed today:   Goals Addressed             This Visit's Progress    COPD Management       Patient Goals/Self Care Activities: -Patient/Caregiver will self-administer medications as prescribed as evidenced by self-report/primary caregiver report  -Patient/Caregiver will attend all scheduled provider appointments as evidenced by clinician review of documented attendance to scheduled appointments and patient/caregiver report -Patient/Caregiver will call provider office for new concerns or questions as evidenced by review of documented incoming telephone call notes and patient report  Care Coordination Interventions: Provided patient with basic written and verbal COPD education on self care/management/and exacerbation prevention Advised patient to self assesses COPD action plan zone and make appointment with provider if in the yellow zone for 48 hours without improvement   Patient reports she is doing good.  Home Health continues.  She is utilizing her inhalers. No questions or concerns about inhalers. Patient is not smoking. Encouraged to continue not smoking. She verbalized understanding.  Pulmonary appointment 09-10-23 per patient.  No concerns.        Our next appointment is by telephone on 09/03/23 at 100 pm  Please call the care guide team at [phone] if you need to cancel or reschedule your appointment.   If you are experiencing a Mental Health or Behavioral Health Crisis or need someone to talk to, please call the Suicide and Crisis Lifeline: 988   Patient verbalizes understanding of instructions and care plan provided today and agrees to view in MyChart. Active MyChart status and patient understanding of how to access instructions and care plan via MyChart confirmed with patient.     The patient has been  provided with contact information for the care management team and has been advised to call with any health related questions or concerns.   Bary Leriche, RN, MSN [facility], [facility] Management Community Coordinator Direct Dial: [phone]  Fax: [phone] Website: [email or web address]

## 2023-07-30 NOTE — Patient Outreach (Signed)
  Care Coordination   Follow Up Visit Note   07/30/2023 Name: Sherry Zimmerman MRN: [id number] DOB: [birth year]  Sherry Zimmerman is a 67 y.o. year old female who sees Willow Ora, MD for primary care. I spoke with  Sherry Zimmerman by phone today.  What matters to the patients health and wellness today?  Health maintenance    Goals Addressed             This Visit's Progress    COPD Management       Patient Goals/Self Care Activities: -Patient/Caregiver will self-administer medications as prescribed as evidenced by self-report/primary caregiver report  -Patient/Caregiver will attend all scheduled provider appointments as evidenced by clinician review of documented attendance to scheduled appointments and patient/caregiver report -Patient/Caregiver will call provider office for new concerns or questions as evidenced by review of documented incoming telephone call notes and patient report  Care Coordination Interventions: Provided patient with basic written and verbal COPD education on self care/management/and exacerbation prevention Advised patient to self assesses COPD action plan zone and make appointment with provider if in the yellow zone for 48 hours without improvement   Patient reports she is doing good.  Home Health continues.  She is utilizing her inhalers. No questions or concerns about inhalers. Patient is not smoking. Encouraged to continue not smoking. She verbalized understanding.  Pulmonary appointment 09-10-23 per patient.  No concerns.        SDOH assessments and interventions completed:  Yes     Care Coordination Interventions:  Yes, provided   Follow up plan: Follow up call scheduled for September    Encounter Outcome:  Pt. Visit Completed   Bary Leriche, RN, MSN [facility] Health  [facility], [facility] Management Community Coordinator Direct Dial: [phone]  Fax: [phone] Website: [email or web address]

## 2023-07-30 NOTE — Telephone Encounter (Signed)
Noted  

## 2023-07-30 NOTE — Telephone Encounter (Signed)
Home Health Verbal Orders  Agency:  [facility]  Caller:  Onalee Hua  Reason for Call:  Reported she lost her balance and fell backwards into cabinet but no injuries    [phone]

## 2023-07-31 DIAGNOSIS — G3281 Cerebellar ataxia in diseases classified elsewhere: Secondary | ICD-10-CM | POA: Diagnosis not present

## 2023-07-31 DIAGNOSIS — U071 COVID-19: Secondary | ICD-10-CM | POA: Diagnosis not present

## 2023-07-31 DIAGNOSIS — J9601 Acute respiratory failure with hypoxia: Secondary | ICD-10-CM | POA: Diagnosis not present

## 2023-07-31 DIAGNOSIS — R627 Adult failure to thrive: Secondary | ICD-10-CM | POA: Diagnosis not present

## 2023-08-03 ENCOUNTER — Telehealth: Payer: Self-pay | Admitting: Family Medicine

## 2023-08-03 NOTE — Telephone Encounter (Signed)
Patient dropped off document Home Health Certificate (Order ID [id number]), to be filled out by provider. Patient requested to send it back via Fax within 5-days. Document is located in providers tray at front office.Please advise

## 2023-08-04 NOTE — Telephone Encounter (Signed)
Forms has been faxed.

## 2023-08-06 DIAGNOSIS — U071 COVID-19: Secondary | ICD-10-CM | POA: Diagnosis not present

## 2023-08-06 DIAGNOSIS — R627 Adult failure to thrive: Secondary | ICD-10-CM | POA: Diagnosis not present

## 2023-08-06 DIAGNOSIS — J9601 Acute respiratory failure with hypoxia: Secondary | ICD-10-CM | POA: Diagnosis not present

## 2023-08-06 DIAGNOSIS — G3281 Cerebellar ataxia in diseases classified elsewhere: Secondary | ICD-10-CM | POA: Diagnosis not present

## 2023-08-11 ENCOUNTER — Ambulatory Visit (HOSPITAL_BASED_OUTPATIENT_CLINIC_OR_DEPARTMENT_OTHER)
Admission: RE | Admit: 2023-08-11 | Discharge: 2023-08-11 | Disposition: A | Discharge status: Home / Self Care | Payer: Medicare Other | Source: Ambulatory Visit | Attending: Family Medicine | Admitting: Family Medicine

## 2023-08-11 DIAGNOSIS — Z122 Encounter for screening for malignant neoplasm of respiratory organs: Secondary | ICD-10-CM | POA: Diagnosis not present

## 2023-08-11 DIAGNOSIS — Z87891 Personal history of nicotine dependence: Secondary | ICD-10-CM | POA: Insufficient documentation

## 2023-08-13 ENCOUNTER — Other Ambulatory Visit: Payer: Self-pay | Admitting: Family Medicine

## 2023-08-13 DIAGNOSIS — U071 COVID-19: Secondary | ICD-10-CM | POA: Diagnosis not present

## 2023-08-13 DIAGNOSIS — G3281 Cerebellar ataxia in diseases classified elsewhere: Secondary | ICD-10-CM | POA: Diagnosis not present

## 2023-08-13 DIAGNOSIS — J9601 Acute respiratory failure with hypoxia: Secondary | ICD-10-CM | POA: Diagnosis not present

## 2023-08-13 DIAGNOSIS — R627 Adult failure to thrive: Secondary | ICD-10-CM | POA: Diagnosis not present

## 2023-08-20 ENCOUNTER — Telehealth: Payer: Self-pay | Admitting: Acute Care

## 2023-08-20 DIAGNOSIS — R911 Solitary pulmonary nodule: Secondary | ICD-10-CM

## 2023-08-20 NOTE — Telephone Encounter (Signed)
Tiffany calling with call report. For CT scan.Tiffany phone number is [phone]. 

## 2023-08-20 NOTE — Telephone Encounter (Signed)
Spoke with Tiffany at [facility].  Call report:  IMPRESSION: 1. Lung-RADS 3, probably benign findings. 4.3 mm right middle lobe pulmonary nodule is new in the interval. Short-term follow-up in 6 months is recommended with repeat low-dose chest CT without contrast (please use the following order, "CT CHEST LCS NODULE FOLLOW-UP W/O CM"). 2. Aortic Atherosclerosis (ICD10-I70.0) and Emphysema (ICD10-J43.9).

## 2023-08-20 NOTE — Telephone Encounter (Signed)
Left VM and call back number for patient to call for review of LDCT results

## 2023-08-21 NOTE — Telephone Encounter (Signed)
Left VM to call for results.

## 2023-08-25 ENCOUNTER — Ambulatory Visit (INDEPENDENT_AMBULATORY_CARE_PROVIDER_SITE_OTHER): Payer: Medicare Other

## 2023-08-25 VITALS — Wt 122.0 lb

## 2023-08-25 DIAGNOSIS — Z0000 Encounter for general adult medical examination without abnormal findings: Secondary | ICD-10-CM

## 2023-08-25 DIAGNOSIS — Z1211 Encounter for screening for malignant neoplasm of colon: Secondary | ICD-10-CM | POA: Diagnosis not present

## 2023-08-25 NOTE — Progress Notes (Addendum)
Subjective:   Sherry Zimmerman is a 67 y.o. female who presents for Medicare Annual (Subsequent) preventive examination.  Visit Complete: Virtual  I connected with  Sherry Zimmerman on 08/25/23 by a audio enabled telemedicine application and verified that I am speaking with the correct person using two identifiers.  Patient Location: Home  Provider Location: Office/Clinic  I discussed the limitations of evaluation and management by telemedicine. The patient expressed understanding and agreed to proceed.   Cardiac Risk Factors include: advanced age (>82men, >65 women)     Objective:    Today's Vitals   08/25/23 1312 08/25/23 1313  Weight: 122 lb (55.3 kg)   PainSc:  0-No pain   Body mass index is 18.55 kg/m.     08/25/2023    1:18 PM 06/29/2023    6:00 PM 06/29/2023   12:36 PM 03/13/2023    3:20 PM 11/11/2022    3:39 PM 08/22/2022    1:06 PM 08/09/2021    2:34 PM  Advanced Directives  Does Patient Have a Medical Advance Directive? No No No No No No No  Would patient like information on creating a medical advance directive? No - Patient declined Yes (Inpatient - patient requests chaplain consult to create a medical advance directive) No - Patient declined No - Patient declined Yes (MAU/Ambulatory/Procedural Areas - Information given) Yes (MAU/Ambulatory/Procedural Areas - Information given) Yes (MAU/Ambulatory/Procedural Areas - Information given)    Current Medications (verified) Outpatient Encounter Medications as of 08/25/2023  Medication Sig   albuterol (VENTOLIN HFA) 108 (90 Base) MCG/ACT inhaler Inhale 1-2 puffs into the lungs every 4 (four) hours as needed for wheezing or shortness of breath.   alendronate (FOSAMAX) 70 MG tablet Take 1 tablet (70 mg total) by mouth every 7 (seven) days. Take with a full glass of water on an empty stomach. Sit upright for 30 minutes   atorvastatin (LIPITOR) 10 MG tablet TAKE 1 TABLET BY MOUTH AT BEDTIME   calcium-vitamin D (OSCAL WITH D)  500-5 MG-MCG tablet Take 1 tablet by mouth daily with breakfast.   Glycopyrrolate-Formoterol (BEVESPI AEROSPHERE) 9-4.8 MCG/ACT AERO Inhale 2 puffs into the lungs 2 (two) times daily.   oxybutynin (DITROPAN) 5 MG tablet Take 1 tablet (5 mg total) by mouth 2 (two) times daily.   rizatriptan (MAXALT) 10 MG tablet Take 10 mg by mouth once as needed for migraine (may repeat once in 2 hours, if no relief).   TYLENOL 500 MG tablet Take 500-1,000 mg by mouth every 6 (six) hours as needed for mild pain or headache.   valACYclovir (VALTREX) 1000 MG tablet Take 2 tablets (2,000 mg total) by mouth 2 (two) times daily. Once, as needed for cold sores   venlafaxine XR (EFFEXOR-XR) 150 MG 24 hr capsule TAKE 1 CAPSULE BY MOUTH AT BEDTIME **TAKE  WITH  75MG   CAPSULES  FOR  225MG   TOTAL  DAILY  DOSE   venlafaxine XR (EFFEXOR-XR) 75 MG 24 hr capsule TAKE 1 CAPSULE BY MOUTH ONCE DAILY **TAKE  WITH  150MG   CAPSULES  FOR  225MG   TOTAL  DAILY  DOSE (Patient taking differently: Take 75 mg by mouth daily with breakfast.)   No facility-administered encounter medications on file as of 08/25/2023.    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   Past Surgical History:  Procedure Laterality Date   ABDOMINAL HYSTERECTOMY     CATARACT EXTRACTION  Bilateral 01/17/2020   SPLENECTOMY, TOTAL N/A 10/26/2014   Procedure: SPLENECTOMY;  Surgeon: Violeta Gelinas, MD;  Location: [facility] OR;  Service: General;  Laterality: N/A;   WRIST FRACTURE SURGERY Right    Family History  Problem Relation Age of Onset   Ataxia Mother    Stroke Mother    Heart disease Mother    Diabetes Mother    Cancer Father    Social History   Socioeconomic History   Marital status: Divorced    Spouse name: Not on file   Number of children: Not on file   Years of education: Not on file   Highest education level: Not on file  Occupational History   Occupation: disabled  Tobacco Use   Smoking status: Every Day    Current packs/day: 1.00    Average packs/day: 1 pack/day for 50.0 years (50.0 ttl pk-yrs)    Types: Cigarettes   Smokeless tobacco: Never   Tobacco comments:    Quit on easter of 2022  Vaping Use   Vaping status: Never Used  Substance and Sexual Activity   Alcohol use: Not Currently    Comment: socially   Drug use: No   Sexual activity: Never  Other Topics Concern   Not on file  Social History Narrative   Not on file   Social Determinants of Health   Financial Resource Strain: Low Risk  (08/25/2023)   Overall Financial Resource Strain (CARDIA)    Difficulty of Paying Living Expenses: Not hard at all  Food Insecurity: No Food Insecurity (08/25/2023)   Hunger Vital Sign    Worried About Running Out of Food in the Last Year: Never true    Ran Out of Food in the Last Year: Never true  Transportation Needs: No Transportation Needs (08/25/2023)   PRAPARE - Administrator, Civil Service (Medical): No    Lack of Transportation (Non-Medical): No  Physical Activity: Inactive (08/25/2023)   Exercise Vital Sign    Days of Exercise per Week: 0 days    Minutes of Exercise per Session: 0 min  Stress: No Stress Concern Present (08/25/2023)   [employer] of Occupational Health - Occupational Stress Questionnaire    Feeling of Stress : Not  at all  Social Connections: Moderately Isolated (08/25/2023)   Social Connection and Isolation Panel [NHANES]    Frequency of Communication with Friends and Family: More than three times a week    Frequency of Social Gatherings with Friends and Family: More than three times a week    Attends Religious Services: Never    Database administrator or Organizations: No    Attends Banker Meetings: Never    Marital Status: Living with partner    Tobacco Counseling Ready to quit: Not Answered Counseling given: Not Answered Tobacco comments: Quit on easter of 2022   Clinical Intake:  Pre-visit preparation completed: Yes  Pain : No/denies pain Pain Score: 0-No pain     BMI - recorded: 18.55 Nutritional Status: BMI <19  Underweight Diabetes: No  How often do you need to have someone help you when you read instructions, pamphlets, or other written materials from your doctor or pharmacy?: 1 - Never  Interpreter Needed?: No  Information entered by :: Lanier Ensign, LPN   Activities of Daily Living    08/25/2023    1:18 PM 06/29/2023    6:00 PM  In your present state of health, do you have any difficulty performing the following activities:  Hearing? 0 0  Vision? 0 0  Difficulty concentrating or making decisions? 0 0  Walking or climbing stairs? 0 1  Dressing or bathing? 0 0  Doing errands, shopping? 0 1  Preparing Food and eating ? N   Using the Toilet? N   In the past six months, have you accidently leaked urine? N   Do you have problems with loss of bowel control? N   Managing your Medications? N   Managing your Finances? N   Housekeeping or managing your Housekeeping? N     Patient Care Team: Willow Ora, MD as PCP - General (Family Medicine) Delories Heinz., MD as Referring Physician (Psychiatry) Fleeta Emmer, RN as [facility] Care Management  Indicate any recent Medical Services you may have received from other than [facility] providers  in the past year (date may be approximate).     Assessment:   This is a routine wellness examination for Fonda.  Hearing/Vision screen Hearing Screening - Comments:: Pt denies any hearing issues  Vision Screening - Comments:: Pt follows up with my eye dr    Goals Addressed             This Visit's Progress    Patient Stated       Pt stated to quit smoking one day        Depression Screen    08/25/2023    1:16 PM 07/13/2023    2:07 PM 12/23/2022    1:26 PM 10/20/2022    9:38 AM 09/18/2022    1:40 PM 09/15/2022   11:40 AM 08/22/2022    1:07 PM  PHQ 2/9 Scores  PHQ - 2 Score 0 0 0 0 0 0 0  PHQ- 9 Score 0 0         Fall Risk    08/25/2023    1:19 PM 07/17/2023    2:36 PM 12/23/2022    1:26 PM 10/20/2022    9:38 AM 09/18/2022    1:40 PM  Fall Risk   Falls in the past year? 1 0 0 1 1  Number falls in past yr: 1  0 1 1  Injury with Fall? 0  0 1 1  Risk for fall due to : Impaired balance/gait;Impaired mobility;Impaired vision;History of fall(s)  No Fall Risks History of fall(s) History of fall(s)  Follow up Falls prevention discussed  Falls evaluation completed Falls evaluation completed Falls evaluation completed    MEDICARE RISK AT HOME: Medicare Risk at Home Any stairs in or around the home?: Yes If so, are there any without handrails?: No Home free of loose throw rugs in walkways, pet beds, electrical cords, etc?: Yes Adequate lighting in your home to reduce risk of falls?: Yes Life alert?: No Use of a cane, walker or w/c?: Yes Grab bars in the bathroom?: Yes Shower chair or bench in  shower?: Yes Elevated toilet seat or a handicapped toilet?: No  TIMED UP AND GO:  Was the test performed?  No    Cognitive Function:        08/25/2023    1:19 PM 08/22/2022    1:10 PM 08/09/2021    2:37 PM  6CIT Screen  What Year? 0 points 0 points 0 points  What month? 0 points 0 points 0 points  What time? 0 points 0 points 0 points  Count back from 20 0 points 0 points 0  points  Months in reverse 0 points 0 points 0 points  Repeat phrase 0 points 0 points 0 points  Total Score 0 points 0 points 0 points    Immunizations Immunization History  Administered Date(s) Administered   DTaP / HiB 11/09/2014   Fluad Quad(high Dose 65+) 10/20/2022   HIB (PRP-T) 11/09/2014   Influenza Split 11/19/2015   Influenza, Seasonal, Injecte, Preservative Fre 10/20/2014   Influenza,inj,Quad PF,6+ Mos 10/10/2016, 09/11/2017, 08/13/2018, 08/29/2019   Influenza-Unspecified 09/26/2017, 10/09/2020   Janssen (J&J) SARS-COV-2 Vaccination 03/15/2020   Meningococcal Conjugate 11/09/2014, 01/05/2015   Pneumococcal Conjugate-13 11/09/2014   Pneumococcal Polysaccharide-23 05/02/2015, 05/23/2020   Tdap 12/08/2008   Zoster Recombinant(Shingrix) 08/31/2019, 12/26/2019    TDAP status: Up to date  Flu Vaccine status: Due, Education has been provided regarding the importance of this vaccine. Advised may receive this vaccine at local pharmacy or Health Dept. Aware to provide a copy of the vaccination record if obtained from local pharmacy or Health Dept. Verbalized acceptance and understanding.  Pneumococcal vaccine status: Up to date  Covid-19 vaccine status: Information provided on how to obtain vaccines.   Qualifies for Shingles Vaccine? Yes   Zostavax completed Yes   Shingrix Completed?: Yes  Screening Tests Health Maintenance  Topic Date Due   COVID-19 Vaccine (2 - Janssen risk series) 04/12/2020   Colonoscopy  03/09/2023   DEXA SCAN  04/03/2023   MAMMOGRAM  07/31/2023   INFLUENZA VACCINE  03/07/2024 (Originally 07/09/2023)   Lung Cancer Screening  08/10/2024   Medicare Annual Wellness (AWV)  08/24/2024   DTaP/Tdap/Td (3 - Td or Tdap) 11/09/2024   Pneumonia Vaccine 10+ Years old  Completed   Hepatitis C Screening  Completed   Zoster Vaccines- Shingrix  Completed   HPV VACCINES  Aged Out    Health Maintenance  Health Maintenance Due  Topic Date Due   COVID-19  Vaccine (2 - Janssen risk series) 04/12/2020   Colonoscopy  03/09/2023   DEXA SCAN  04/03/2023   MAMMOGRAM  07/31/2023    Colorectal cancer screening: Referral to GI placed 08/25/23. Pt aware the office will call re: appt.  Mammogram status: Completed 07/30/22. Repeat every year pt ststed she has been scheduled for appt     Lung Cancer Screening: (Low Dose CT Chest recommended if Age 43-80 years, 20 pack-year currently smoking OR have quit w/in 15years.) does qualify.   Lung Cancer Screening Referral: due on 08/10/24  Additional Screening:  Hepatitis C Screening:  Completed 05/15/17  Vision Screening: Recommended annual ophthalmology exams for early detection of glaucoma and other disorders of the eye. Is the patient up to date with their annual eye exam?  Yes  Who is the provider or what is the name of the office in which the patient attends annual eye exams? My eye dr If pt is not established with a provider, would they like to be referred to a provider to establish care? No .   Dental Screening:  Recommended annual dental exams for proper oral hygiene  Community Resource Referral / Chronic Care Management: CRR required this visit?  No   CCM required this visit?  No     Plan:     I have personally reviewed and noted the following in the patient's chart:   Medical and social history Use of alcohol, tobacco or illicit drugs  Current medications and supplements including opioid prescriptions. Patient is not currently taking opioid prescriptions. Functional ability and status Nutritional status Physical activity Advanced directives List of other physicians Hospitalizations, surgeries, and ER visits in previous 12 months Vitals Screenings to include cognitive, depression, and falls Referrals and appointments  In addition, I have reviewed and discussed with patient certain preventive protocols, quality metrics, and best practice recommendations. A written personalized care  plan for preventive services as well as general preventive health recommendations were provided to patient.     Marzella Schlein, LPN   04/01/9562   After Visit Summary: (MyChart) Due to this being a telephonic visit, the after visit summary with patients personalized plan was offered to patient via MyChart   Nurse Notes: none

## 2023-08-25 NOTE — Patient Instructions (Signed)
Sherry Zimmerman , Thank you for taking time to come for your Medicare Wellness Visit. I appreciate your ongoing commitment to your health goals. Please review the following plan we discussed and let me know if I can assist you in the future.   Referrals/Orders/Follow-Ups/Clinician Recommendations: to stop smoking , vaccinations will be addressed at later date per pt   This is a list of the screening recommended for you and due dates:  Health Maintenance  Topic Date Due   COVID-19 Vaccine (2 - Janssen risk series) 04/12/2020   Colon Cancer Screening  03/09/2023   DEXA scan (bone density measurement)  04/03/2023   Mammogram  07/31/2023   Flu Shot  03/07/2024*   Screening for Lung Cancer  08/10/2024   Medicare Annual Wellness Visit  08/24/2024   DTaP/Tdap/Td vaccine (3 - Td or Tdap) 11/09/2024   Pneumonia Vaccine  Completed   Hepatitis C Screening  Completed   Zoster (Shingles) Vaccine  Completed   HPV Vaccine  Aged Out  *Topic was postponed. The date shown is not the original due date.    Advanced directives: (Declined) Advance directive discussed with you today. Even though you declined this today, please call our office should you change your mind, and we can give you the proper paperwork for you to fill out.  Next Medicare Annual Wellness Visit scheduled for next year: Yes

## 2023-08-26 ENCOUNTER — Encounter: Payer: Self-pay | Admitting: Nurse Practitioner

## 2023-08-26 NOTE — Telephone Encounter (Signed)
Mychart letter sent requesting patient call for review of LDCT results

## 2023-08-27 NOTE — Progress Notes (Signed)
Subjective:   Sherry Zimmerman is a 67 y.o. female who presents for Medicare Annual (Subsequent) preventive examination.  Visit Complete: Virtual  I connected with  Sherry Zimmerman on 08/27/23 by a audio enabled telemedicine application and verified that I am speaking with the correct person using two identifiers.  Patient Location: Home  Provider Location: Office/Clinic  I discussed the limitations of evaluation and management by telemedicine. The patient expressed understanding and agreed to proceed.   Vital Signs: Unable to obtain new vitals due to this being a telehealth visit.    Cardiac Risk Factors include: advanced age (>39men, >29 women)     Objective:    Today's Vitals   08/25/23 1312 08/25/23 1313  Weight: 122 lb (55.3 kg)   PainSc:  0-No pain   Body mass index is 18.55 kg/m.     08/25/2023    1:18 PM 06/29/2023    6:00 PM 06/29/2023   12:36 PM 03/13/2023    3:20 PM 11/11/2022    3:39 PM 08/22/2022    1:06 PM 08/09/2021    2:34 PM  Advanced Directives  Does Patient Have a Medical Advance Directive? No No No No No No No  Would patient like information on creating a medical advance directive? No - Patient declined Yes (Inpatient - patient requests chaplain consult to create a medical advance directive) No - Patient declined No - Patient declined Yes (MAU/Ambulatory/Procedural Areas - Information given) Yes (MAU/Ambulatory/Procedural Areas - Information given) Yes (MAU/Ambulatory/Procedural Areas - Information given)    Current Medications (verified) Outpatient Encounter Medications as of 08/25/2023  Medication Sig   albuterol (VENTOLIN HFA) 108 (90 Base) MCG/ACT inhaler Inhale 1-2 puffs into the lungs every 4 (four) hours as needed for wheezing or shortness of breath.   alendronate (FOSAMAX) 70 MG tablet Take 1 tablet (70 mg total) by mouth every 7 (seven) days. Take with a full glass of water on an empty stomach. Sit upright for 30 minutes   atorvastatin (LIPITOR)  10 MG tablet TAKE 1 TABLET BY MOUTH AT BEDTIME   calcium-vitamin D (OSCAL WITH D) 500-5 MG-MCG tablet Take 1 tablet by mouth daily with breakfast.   Glycopyrrolate-Formoterol (BEVESPI AEROSPHERE) 9-4.8 MCG/ACT AERO Inhale 2 puffs into the lungs 2 (two) times daily.   oxybutynin (DITROPAN) 5 MG tablet Take 1 tablet (5 mg total) by mouth 2 (two) times daily.   rizatriptan (MAXALT) 10 MG tablet Take 10 mg by mouth once as needed for migraine (may repeat once in 2 hours, if no relief).   TYLENOL 500 MG tablet Take 500-1,000 mg by mouth every 6 (six) hours as needed for mild pain or headache.   valACYclovir (VALTREX) 1000 MG tablet Take 2 tablets (2,000 mg total) by mouth 2 (two) times daily. Once, as needed for cold sores   venlafaxine XR (EFFEXOR-XR) 150 MG 24 hr capsule TAKE 1 CAPSULE BY MOUTH AT BEDTIME **TAKE  WITH  75MG   CAPSULES  FOR  225MG   TOTAL  DAILY  DOSE   venlafaxine XR (EFFEXOR-XR) 75 MG 24 hr capsule TAKE 1 CAPSULE BY MOUTH ONCE DAILY **TAKE  WITH  150MG   CAPSULES  FOR  225MG   TOTAL  DAILY  DOSE (Patient taking differently: Take 75 mg by mouth daily with breakfast.)   No facility-administered encounter medications on file as of 08/25/2023.    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   Procedure: SPLENECTOMY;  Surgeon: Violeta Gelinas, MD;  Location: [facility] OR;  Service: General;  Laterality: N/A;   WRIST FRACTURE SURGERY Right    Family History  Problem Relation Age of Onset   Ataxia Mother    Stroke Mother    Heart disease Mother    Diabetes Mother    Cancer Father    Social History   Socioeconomic History   Marital status: Divorced    Spouse name: Not on file   Number of children: Not on file   Years of education: Not on file   Highest education level: Not on file  Occupational History   Occupation: disabled  Tobacco Use   Smoking status: Every Day    Current packs/day: 1.00    Average packs/day: 1 pack/day for 50.0 years (50.0 ttl pk-yrs)    Types: Cigarettes   Smokeless tobacco: Never   Tobacco comments:    Quit on easter of 2022  Vaping Use   Vaping status: Never Used  Substance and Sexual Activity   Alcohol use: Not Currently    Comment: socially   Drug use: No   Sexual activity: Never  Other Topics Concern   Not on file  Social History Narrative   Not on file   Social Determinants of Health   Financial Resource Strain: Low Risk  (08/25/2023)   Overall Financial Resource Strain (CARDIA)    Difficulty of Paying Living Expenses: Not hard at all  Food Insecurity: No Food Insecurity (08/25/2023)   Hunger Vital Sign    Worried About Running Out of Food in the Last Year: Never true    Ran Out of Food in the Last Year: Never true  Transportation Needs: No Transportation Needs (08/25/2023)   PRAPARE - Administrator, Civil Service (Medical): No    Lack of Transportation (Non-Medical): No  Physical Activity: Inactive (08/25/2023)   Exercise Vital Sign    Days of Exercise per Week: 0 days    Minutes of Exercise per Session: 0 min  Stress: No Stress Concern Present (08/25/2023)   [employer] of [employer]ational [employer] - Occupational Stress Questionnaire    Feeling of Stress : Not at all  Social Connections: Moderately Isolated (08/25/2023)   Social Connection and Isolation Panel [NHANES]    Frequency of Communication with Friends and Family: More than three times a week    Frequency of Social Gatherings with Friends and Family: More than three times a week    Attends Religious Services: Never    Database administrator or Organizations: No    Attends Banker Meetings: Never    Marital Status: Living with partner    Tobacco Counseling Ready to quit: Not Answered Counseling  given: Not Answered Tobacco comments: Quit on easter of 2022   Clinical Intake:  Pre-visit preparation completed: Yes  Pain : No/denies pain Pain Score: 0-No pain     BMI - recorded: 18.55 Nutritional Status: BMI <19  Underweight Diabetes: No  How often do you need to have someone help you when you read instructions, pamphlets, or other written materials from your doctor or pharmacy?: 1 - Never  Interpreter Needed?: No  Information entered by :: Lanier Ensign, LPN   Activities of Daily Living    08/25/2023    1:18 PM 06/29/2023    6:00 PM  In your present state of health, do you have any difficulty performing the following activities:  Hearing? 0 0  Vision? 0 0  Difficulty concentrating or making decisions? 0 0  Walking or climbing stairs? 0 1  Dressing or bathing? 0 0  Doing errands, shopping? 0 1  Preparing Food and eating ? N   Using the Toilet? N   In the past six months, have you accidently leaked urine? N   Do you have problems with loss of bowel control? N   Managing your Medications? N   Managing your Finances? N   Housekeeping or managing your Housekeeping? N     Patient Care Team: Willow Ora, MD as PCP - General (Family Medicine) Delories Heinz., MD as Referring Physician (Psychiatry) Fleeta Emmer, RN as [facility] Care  Management  Indicate any recent Medical Services you may have received from other than [facility] providers in the past year (date may be approximate).     Assessment:   This is a routine wellness examination for Sherry Zimmerman.  Hearing/Vision screen Hearing Screening - Comments:: Pt denies any hearing issues  Vision Screening - Comments:: Pt follows up with my eye dr    Goals Addressed             This Visit's Progress    Patient Stated       Pt stated to quit smoking one day        Depression Screen    08/25/2023    1:16 PM 07/13/2023    2:07 PM 12/23/2022    1:26 PM 10/20/2022    9:38 AM 09/18/2022    1:40 PM 09/15/2022   11:40 AM 08/22/2022    1:07 PM  PHQ 2/9 Scores  PHQ - 2 Score 0 0 0 0 0 0 0  PHQ- 9 Score 0 0         Fall Risk    08/25/2023    1:19 PM 07/17/2023    2:36 PM 12/23/2022    1:26 PM 10/20/2022    9:38 AM 09/18/2022    1:40 PM  Fall Risk   Falls in the past year? 1 0 0 1 1  Number falls in past yr: 1  0 1 1  Injury with Fall? 0  0 1 1  Risk for fall due to : Impaired balance/gait;Impaired mobility;Impaired vision;History of fall(s)  No Fall Risks History of fall(s) History of fall(s)  Follow up Falls prevention discussed  Falls evaluation completed Falls evaluation completed Falls evaluation completed    MEDICARE RISK AT HOME: Medicare Risk at Home Any stairs in or around the home?: Yes If so, are there any without handrails?: No Home free of loose throw rugs in walkways, pet beds, electrical cords, etc?: Yes Adequate lighting in your home to reduce risk of falls?: Yes Life alert?: No Use of a cane,  walker or w/c?: Yes Grab bars in the bathroom?: Yes Shower chair or bench in shower?: Yes Elevated toilet seat or a handicapped toilet?: No  TIMED UP AND GO:  Was the test performed?  No    Cognitive Function:        08/25/2023    1:19 PM 08/22/2022    1:10 PM 08/09/2021    2:37 PM  6CIT Screen  What Year? 0 points 0 points 0 points  What month? 0  points 0 points 0 points  What time? 0 points 0 points 0 points  Count back from 20 0 points 0 points 0 points  Months in reverse 0 points 0 points 0 points  Repeat phrase 0 points 0 points 0 points  Total Score 0 points 0 points 0 points    Immunizations Immunization History  Administered Date(s) Administered   DTaP / HiB 11/09/2014   Fluad Quad(high Dose 65+) 10/20/2022   HIB (PRP-T) 11/09/2014   Influenza Split 11/19/2015   Influenza, Seasonal, Injecte, Preservative Fre 10/20/2014   Influenza,inj,Quad PF,6+ Mos 10/10/2016, 09/11/2017, 08/13/2018, 08/29/2019   Influenza-Unspecified 09/26/2017, 10/09/2020   Janssen (J&J) SARS-COV-2 Vaccination 03/15/2020   Meningococcal Conjugate 11/09/2014, 01/05/2015   Pneumococcal Conjugate-13 11/09/2014   Pneumococcal Polysaccharide-23 05/02/2015, 05/23/2020   Tdap 12/08/2008   Zoster Recombinant(Shingrix) 08/31/2019, 12/26/2019    TDAP status: Up to date  Flu Vaccine status: Due, Education has been provided regarding the importance of this vaccine. Advised may receive this vaccine at local pharmacy or Health Dept. Aware to provide a copy of the vaccination record if obtained from local pharmacy or Health Dept. Verbalized acceptance and understanding.  Pneumococcal vaccine status: Up to date  Covid-19 vaccine status: Information provided on how to obtain vaccines.   Qualifies for Shingles Vaccine? Yes   Zostavax completed Yes   Shingrix Completed?: Yes  Screening Tests Health Maintenance  Topic Date Due   COVID-19 Vaccine (2 - Janssen risk series) 04/12/2020   Colonoscopy  03/09/2023   DEXA SCAN  04/03/2023   MAMMOGRAM  07/31/2023   INFLUENZA VACCINE  03/07/2024 (Originally 07/09/2023)   Lung Cancer Screening  08/10/2024   Medicare Annual Wellness (AWV)  08/24/2024   DTaP/Tdap/Td (3 - Td or Tdap) 11/09/2024   Pneumonia Vaccine 41+ Years old  Completed   Hepatitis C Screening  Completed   Zoster Vaccines- Shingrix  Completed    HPV VACCINES  Aged Out    Health Maintenance  Health Maintenance Due  Topic Date Due   COVID-19 Vaccine (2 - Janssen risk series) 04/12/2020   Colonoscopy  03/09/2023   DEXA SCAN  04/03/2023   MAMMOGRAM  07/31/2023    Colorectal cancer screening: Referral to GI placed 08/25/23. Pt aware the office will call re: appt.  Mammogram status: Completed 07/30/22. Repeat every year pt ststed she has been scheduled for appt     Lung Cancer Screening: (Low Dose CT Chest recommended if Age 73-80 years, 20 pack-year currently smoking OR have quit w/in 15years.) does qualify.   Lung Cancer Screening Referral: due on 08/10/24  Additional Screening:  Hepatitis C Screening:  Completed 05/15/17  Vision Screening: Recommended annual ophthalmology exams for early detection of glaucoma and other disorders of the eye. Is the patient up to date with their annual eye exam?  Yes  Who is the provider or what is the name of the office in which the patient attends annual eye exams? My eye dr If pt is not established with a provider, would they like  to be referred to a provider to establish care? No .   Dental Screening: Recommended annual dental exams for proper oral hygiene  Community Resource Referral / Chronic Care Management: CRR required this visit?  No   CCM required this visit?  No     Plan:     I have personally reviewed and noted the following in the patient's chart:   Medical and social history Use of alcohol, tobacco or illicit drugs  Current medications and supplements including opioid prescriptions. Patient is not currently taking opioid prescriptions. Functional ability and status Nutritional status Physical activity Advanced directives List of other physicians Hospitalizations, surgeries, and ER visits in previous 12 months Vitals Screenings to include cognitive, depression, and falls Referrals and appointments  In addition, I have reviewed and discussed with patient certain  preventive protocols, quality metrics, and best practice recommendations. A written personalized care plan for preventive services as well as general preventive health recommendations were provided to patient.     Marzella Schlein, LPN   [id number]   After Visit Summary: (MyChart) Due to this being a telephonic visit, the after visit summary with patients personalized plan was offered to patient via MyChart   Nurse Notes: none

## 2023-09-01 NOTE — Progress Notes (Signed)
Subjective:   Sherry Zimmerman is a 67 y.o. female who presents for Medicare Annual (Subsequent) preventive examination.  Visit Complete: Virtual  I connected with  Zaara Manzo on 09/01/23 by a audio enabled telemedicine application and verified that I am speaking with the correct person using two identifiers.  Patient Location: Home  Provider Location: Office/Clinic  I discussed the limitations of evaluation and management by telemedicine. The patient expressed understanding and agreed to proceed.   Vital Signs: Unable to obtain new vitals due to this being a telehealth visit.    Cardiac Risk Factors include: advanced age (>68men, >64 women)     Objective:    Today's Vitals   08/25/23 1312 08/25/23 1313  Weight: 122 lb (55.3 kg)   PainSc:  0-No pain   Body mass index is 18.55 kg/m.     08/25/2023    1:18 PM 06/29/2023    6:00 PM 06/29/2023   12:36 PM 03/13/2023    3:20 PM 11/11/2022    3:39 PM 08/22/2022    1:06 PM 08/09/2021    2:34 PM  Advanced Directives  Does Patient Have a Medical Advance Directive? No No No No No No No  Would patient like information on creating a medical advance directive? No - Patient declined Yes (Inpatient - patient requests chaplain consult to create a medical advance directive) No - Patient declined No - Patient declined Yes (MAU/Ambulatory/Procedural Areas - Information given) Yes (MAU/Ambulatory/Procedural Areas - Information given) Yes (MAU/Ambulatory/Procedural Areas - Information given)    Current Medications (verified) Outpatient Encounter Medications as of 08/25/2023  Medication Sig   albuterol (VENTOLIN HFA) 108 (90 Base) MCG/ACT inhaler Inhale 1-2 puffs into the lungs every 4 (four) hours as needed for wheezing or shortness of breath.   alendronate (FOSAMAX) 70 MG tablet Take 1 tablet (70 mg total) by mouth every 7 (seven) days. Take with a full glass of water on an empty stomach. Sit upright for 30 minutes   atorvastatin (LIPITOR)  10 MG tablet TAKE 1 TABLET BY MOUTH AT BEDTIME   calcium-vitamin D (OSCAL WITH D) 500-5 MG-MCG tablet Take 1 tablet by mouth daily with breakfast.   Glycopyrrolate-Formoterol (BEVESPI AEROSPHERE) 9-4.8 MCG/ACT AERO Inhale 2 puffs into the lungs 2 (two) times daily.   oxybutynin (DITROPAN) 5 MG tablet Take 1 tablet (5 mg total) by mouth 2 (two) times daily.   rizatriptan (MAXALT) 10 MG tablet Take 10 mg by mouth once as needed for migraine (may repeat once in 2 hours, if no relief).   TYLENOL 500 MG tablet Take 500-1,000 mg by mouth every 6 (six) hours as needed for mild pain or headache.   valACYclovir (VALTREX) 1000 MG tablet Take 2 tablets (2,000 mg total) by mouth 2 (two) times daily. Once, as needed for cold sores   venlafaxine XR (EFFEXOR-XR) 150 MG 24 hr capsule TAKE 1 CAPSULE BY MOUTH AT BEDTIME **TAKE  WITH  75MG   CAPSULES  FOR  225MG   TOTAL  DAILY  DOSE   venlafaxine XR (EFFEXOR-XR) 75 MG 24 hr capsule TAKE 1 CAPSULE BY MOUTH ONCE DAILY **TAKE  WITH  150MG   CAPSULES  FOR  225MG   TOTAL  DAILY  DOSE (Patient taking differently: Take 75 mg by mouth daily with breakfast.)   No facility-administered encounter medications on file as of 08/25/2023.    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   Procedure: SPLENECTOMY;  Surgeon: Violeta Gelinas, MD;  Location: [facility] OR;  Service: General;  Laterality: N/A;   WRIST FRACTURE SURGERY Right    Family History  Problem Relation Age of Onset   Ataxia Mother    Stroke Mother    Heart disease Mother    Diabetes Mother    Cancer Father    Social History   Socioeconomic History   Marital status: Divorced    Spouse name: Not on file   Number of children: Not on file   Years of education: Not on file   Highest education level: Not on file  Occupational History   Occupation: disabled  Tobacco Use   Smoking status: Every Day    Current packs/day: 1.00    Average packs/day: 1 pack/day for 50.0 years (50.0 ttl pk-yrs)    Types: Cigarettes   Smokeless tobacco: Never   Tobacco comments:    Quit on easter of 2022  Vaping Use   Vaping status: Never Used  Substance and Sexual Activity   Alcohol use: Not Currently    Comment: socially   Drug use: No   Sexual activity: Never  Other Topics Concern   Not on file  Social History Narrative   Not on file   Social Determinants of Health   Financial Resource Strain: Low Risk  (08/25/2023)   Overall Financial Resource Strain (CARDIA)    Difficulty of Paying Living Expenses: Not hard at all  Food Insecurity: No Food Insecurity (08/25/2023)   Hunger Vital Sign    Worried About Running Out of Food in the Last Year: Never true    Ran Out of Food in the Last Year: Never true  Transportation Needs: No Transportation Needs (08/25/2023)   PRAPARE - Administrator, Civil Service (Medical): No    Lack of Transportation (Non-Medical): No  Physical Activity: Inactive (08/25/2023)   Exercise Vital Sign    Days of Exercise per Week: 0 days    Minutes of Exercise per Session: 0 min  Stress: No Stress Concern Present (08/25/2023)   [employer] of Occupational Health - Occupational Stress Questionnaire    Feeling of Stress : Not at all  Social Connections: Moderately Isolated (08/25/2023)   Social Connection and Isolation Panel [NHANES]    Frequency of Communication with Friends and Family: More than three times a week    Frequency of Social Gatherings with Friends and Family: More than three times a week    Attends Religious Services: Never    Database administrator or Organizations: No    Attends Banker Meetings: Never    Marital Status: Living with partner    Tobacco Counseling Ready to quit: Not Answered Counseling  given: Not Answered Tobacco comments: Quit on easter of 2022   Clinical Intake:  Pre-visit preparation completed: Yes  Pain : No/denies pain Pain Score: 0-No pain     BMI - recorded: 18.55 Nutritional Status: BMI <19  Underweight Diabetes: No  How often do you need to have someone help you when you read instructions, pamphlets, or other written materials from your doctor or pharmacy?: 1 - Never  Interpreter Needed?: No  Information entered by :: Lanier Ensign, LPN   Activities of Daily Living    08/25/2023    1:18 PM 06/29/2023    6:00 PM  In your present state of health, do you have any difficulty performing the following activities:  Hearing? 0 0  Vision? 0 0  Difficulty concentrating or making decisions? 0 0  Walking or climbing stairs? 0 1  Dressing or bathing? 0 0  Doing errands, shopping? 0 1  Preparing Food and eating ? N   Using the Toilet? N   In the past six months, have you accidently leaked urine? N   Do you have problems with loss of bowel control? N   Managing your Medications? N   Managing your Finances? N   Housekeeping or managing your Housekeeping? N     Patient Care Team: Willow Ora, MD as PCP - General (Family Medicine) Delories Heinz., MD as Referring Physician (Psychiatry) Fleeta Emmer, RN as [facility] Care  Management  Indicate any recent Medical Services you may have received from other than [facility] providers in the past year (date may be approximate).     Assessment:   This is a routine wellness examination for Kiersten.  Hearing/Vision screen Hearing Screening - Comments:: Pt denies any hearing issues  Vision Screening - Comments:: Pt follows up with my eye dr    Goals Addressed             This Visit's Progress    Patient Stated       Pt stated to quit smoking one day        Depression Screen    08/25/2023    1:16 PM 07/13/2023    2:07 PM 12/23/2022    1:26 PM 10/20/2022    9:38 AM 09/18/2022    1:40 PM 09/15/2022   11:40 AM 08/22/2022    1:07 PM  PHQ 2/9 Scores  PHQ - 2 Score 0 0 0 0 0 0 0  PHQ- 9 Score 0 0         Fall Risk    08/25/2023    1:19 PM 07/17/2023    2:36 PM 12/23/2022    1:26 PM 10/20/2022    9:38 AM 09/18/2022    1:40 PM  Fall Risk   Falls in the past year? 1 0 0 1 1  Number falls in past yr: 1  0 1 1  Injury with Fall? 0  0 1 1  Risk for fall due to : Impaired balance/gait;Impaired mobility;Impaired vision;History of fall(s)  No Fall Risks History of fall(s) History of fall(s)  Follow up Falls prevention discussed  Falls evaluation completed Falls evaluation completed Falls evaluation completed    MEDICARE RISK AT HOME: Medicare Risk at Home Any stairs in or around the home?: Yes If so, are there any without handrails?: No Home free of loose throw rugs in walkways, pet beds, electrical cords, etc?: Yes Adequate lighting in your home to reduce risk of falls?: Yes Life alert?: No Use of a cane,  walker or w/c?: Yes Grab bars in the bathroom?: Yes Shower chair or bench in shower?: Yes Elevated toilet seat or a handicapped toilet?: No  TIMED UP AND GO:  Was the test performed?  No    Cognitive Function:        08/25/2023    1:19 PM 08/22/2022    1:10 PM 08/09/2021    2:37 PM  6CIT Screen  What Year? 0 points 0 points 0 points  What month? 0  points 0 points 0 points  What time? 0 points 0 points 0 points  Count back from 20 0 points 0 points 0 points  Months in reverse 0 points 0 points 0 points  Repeat phrase 0 points 0 points 0 points  Total Score 0 points 0 points 0 points    Immunizations Immunization History  Administered Date(s) Administered   DTaP / HiB 11/09/2014   Fluad Quad(high Dose 65+) 10/20/2022   HIB (PRP-T) 11/09/2014   Influenza Split 11/19/2015   Influenza, Seasonal, Injecte, Preservative Fre 10/20/2014   Influenza,inj,Quad PF,6+ Mos 10/10/2016, 09/11/2017, 08/13/2018, 08/29/2019   Influenza-Unspecified 09/26/2017, 10/09/2020   Janssen (J&J) SARS-COV-2 Vaccination 03/15/2020   Meningococcal Conjugate 11/09/2014, 01/05/2015   Pneumococcal Conjugate-13 11/09/2014   Pneumococcal Polysaccharide-23 05/02/2015, 05/23/2020   Tdap 12/08/2008   Zoster Recombinant(Shingrix) 08/31/2019, 12/26/2019    TDAP status: Up to date  Flu Vaccine status: Due, Education has been provided regarding the importance of this vaccine. Advised may receive this vaccine at local pharmacy or Health Dept. Aware to provide a copy of the vaccination record if obtained from local pharmacy or Health Dept. Verbalized acceptance and understanding.  Pneumococcal vaccine status: Up to date  Covid-19 vaccine status: Information provided on how to obtain vaccines.   Qualifies for Shingles Vaccine? Yes   Zostavax completed Yes   Shingrix Completed?: Yes  Screening Tests Health Maintenance  Topic Date Due   COVID-19 Vaccine (2 - Janssen risk series) 04/12/2020   Colonoscopy  03/09/2023   DEXA SCAN  04/03/2023   MAMMOGRAM  07/31/2023   INFLUENZA VACCINE  03/07/2024 (Originally 07/09/2023)   Lung Cancer Screening  08/10/2024   Medicare Annual Wellness (AWV)  08/24/2024   DTaP/Tdap/Td (3 - Td or Tdap) 11/09/2024   Pneumonia Vaccine 1+ Years old  Completed   Hepatitis C Screening  Completed   Zoster Vaccines- Shingrix  Completed    HPV VACCINES  Aged Out    Health Maintenance  Health Maintenance Due  Topic Date Due   COVID-19 Vaccine (2 - Janssen risk series) 04/12/2020   Colonoscopy  03/09/2023   DEXA SCAN  04/03/2023   MAMMOGRAM  07/31/2023    Colorectal cancer screening: Referral to GI placed 08/25/23. Pt aware the office will call re: appt.  Mammogram status: Completed 07/30/22. Repeat every year pt ststed she has been scheduled for appt     Lung Cancer Screening: (Low Dose CT Chest recommended if Age 53-80 years, 20 pack-year currently smoking OR have quit w/in 15years.) does qualify.   Lung Cancer Screening Referral: due on 08/10/24  Additional Screening:  Hepatitis C Screening:  Completed 05/15/17  Vision Screening: Recommended annual ophthalmology exams for early detection of glaucoma and other disorders of the eye. Is the patient up to date with their annual eye exam?  Yes  Who is the provider or what is the name of the office in which the patient attends annual eye exams? My eye dr If pt is not established with a provider, would they like  to be referred to a provider to establish care? No .   Dental Screening: Recommended annual dental exams for proper oral hygiene  Community Resource Referral / Chronic Care Management: CRR required this visit?  No   CCM required this visit?  No     Plan:     I have personally reviewed and noted the following in the patient's chart:   Medical and social history Use of alcohol, tobacco or illicit drugs  Current medications and supplements including opioid prescriptions. Patient is not currently taking opioid prescriptions. Functional ability and status Nutritional status Physical activity Advanced directives List of other physicians Hospitalizations, surgeries, and ER visits in previous 12 months Vitals Screenings to include cognitive, depression, and falls Referrals and appointments  In addition, I have reviewed and discussed with patient certain  preventive protocols, quality metrics, and best practice recommendations. A written personalized care plan for preventive services as well as general preventive health recommendations were provided to patient.     Marzella Schlein, LPN   [id number]/62/[id number]   After Visit Summary: (MyChart) Due to this being a telephonic visit, the after visit summary with patients personalized plan was offered to patient via MyChart   Nurse Notes: none

## 2023-09-02 NOTE — Telephone Encounter (Signed)
Letter mailed to home for patient to call for results of LDCT

## 2023-09-03 ENCOUNTER — Ambulatory Visit: Payer: Self-pay

## 2023-09-03 DIAGNOSIS — Z1231 Encounter for screening mammogram for malignant neoplasm of breast: Secondary | ICD-10-CM | POA: Diagnosis not present

## 2023-09-03 LAB — HM MAMMOGRAPHY

## 2023-09-03 NOTE — Patient Instructions (Signed)
Visit Information  Thank you for taking time to visit with me today. Please don't hesitate to contact me if I can be of assistance to you.   Following are the goals we discussed today:   Goals Addressed             This Visit's Progress    Emphysema Management       Patient Goals/Self Care Activities: -Patient/Caregiver will self-administer medications as prescribed as evidenced by self-report/primary caregiver report  -Patient/Caregiver will attend all scheduled provider appointments as evidenced by clinician review of documented attendance to scheduled appointments and patient/caregiver report -Patient/Caregiver will call provider office for new concerns or questions as evidenced by review of documented incoming telephone call notes and patient report  Care Coordination Interventions: Evaluation of current treatment plan related to Emphysema and patient's adherence to plan as established by provider Provided education to patient re: Emphysema and not smoking.    Patient reports she is doing about the same.  She uses inhalers as needed.  Home Health complete.  Patient continues not to smoke. Encouraged to continue not smoking. She verbalized understanding.  Pulmonary appointment 09-09-23 per patient.  No concerns.        Our next appointment is by telephone on 10/01/23 at 100 pm  Please call the care guide team at [phone] if you need to cancel or reschedule your appointment.   If you are experiencing a Mental Health or Behavioral Health Crisis or need someone to talk to, please call the Suicide and Crisis Lifeline: 988   Patient verbalizes understanding of instructions and care plan provided today and agrees to view in MyChart. Active MyChart status and patient understanding of how to access instructions and care plan via MyChart confirmed with patient.     The patient has been provided with contact information for the care management team and has been advised to call with  any health related questions or concerns.   Bary Leriche, RN, MSN [facility], [facility] Management Community Coordinator Direct Dial: [phone]  Fax: [phone] Website: [email or web address]

## 2023-09-03 NOTE — Patient Outreach (Addendum)
Care Coordination   Follow Up Visit Note   09/03/2023 Name: Janill Fusco MRN: [id number] DOB: Jun 11, 1956  Sarrah Murnan is a 67 y.o. year old female who sees Willow Ora, MD for primary care. I spoke with  Mackenna Serrette by phone today.  What matters to the patients health and wellness today?  Maintain health    Goals Addressed             This Visit's Progress    Emphysema Management       Patient Goals/Self Care Activities: -Patient/Caregiver will self-administer medications as prescribed as evidenced by self-report/primary caregiver report  -Patient/Caregiver will attend all scheduled provider appointments as evidenced by clinician review of documented attendance to scheduled appointments and patient/caregiver report -Patient/Caregiver will call provider office for new concerns or questions as evidenced by review of documented incoming telephone call notes and patient report  Care Coordination Interventions: Evaluation of current treatment plan related to Emphysema and patient's adherence to plan as established by provider Provided education to patient re: Emphysema and not smoking.    Patient reports she is doing about the same.  She uses inhalers as needed.  Home Health complete.  Patient continues not to smoke. Encouraged to continue not smoking. She verbalized understanding.  Pulmonary appointment 09-09-23 per patient.  No concerns.        SDOH assessments and interventions completed:  Yes     Care Coordination Interventions:  Yes, provided   Follow up plan: Follow up call scheduled for October    Encounter Outcome:  Patient Visit Completed   Bary Leriche, RN, MSN [place]  [facility], [facility] Management Community Coordinator Direct Dial: [phone]  Fax: [phone] Website: [email or web address]

## 2023-09-05 ENCOUNTER — Other Ambulatory Visit: Payer: Self-pay | Admitting: Family Medicine

## 2023-09-08 ENCOUNTER — Encounter: Payer: Self-pay | Admitting: Family Medicine

## 2023-09-08 NOTE — Addendum Note (Signed)
Addended by: Karlton Lemon on: 09/08/2023 02:26 PM   Modules accepted: Orders

## 2023-09-08 NOTE — Telephone Encounter (Signed)
Spoke with patient by phone, using two patient identifiers, to review results of LDCT. New right lung nodule with recommendation for follow up LDCT in 6 months. Nodule is likely benign but, as precaution, would prefer to re-image it in 6 months.  Patient in agreement.  Atherosclerosis and emphysema, as previously noted.  Results/plan faxed to PCP.  Order placed for 6 months follow up LDCT/nodule.

## 2023-09-09 ENCOUNTER — Ambulatory Visit: Payer: Medicare Other | Admitting: Pulmonary Disease

## 2023-09-09 ENCOUNTER — Encounter: Payer: Self-pay | Admitting: Pulmonary Disease

## 2023-09-09 VITALS — BP 118/80 | HR 108 | Ht 68.0 in | Wt 126.2 lb

## 2023-09-09 DIAGNOSIS — R0609 Other forms of dyspnea: Secondary | ICD-10-CM

## 2023-09-09 DIAGNOSIS — F17210 Nicotine dependence, cigarettes, uncomplicated: Secondary | ICD-10-CM | POA: Diagnosis not present

## 2023-09-09 NOTE — Patient Instructions (Addendum)
Use Bevespi (white inhaler) 2 puffs twice a day every day  Use albuterol (red inhaler) 2 puffs every 4-6 hours as needed for shortness of breath  I ordered pulmonary function test to be scheduled hopefully at time of next visit in 3 months.  This will allow [place] to make an accurate diagnosis and understand how the lungs are functioning.  You do have emphysema which is damage from cigarette smoking on your CT scan  There are small nodules recommend follow-up CT scan in March 2025, this was ordered by Kandice Robinsons NP  Call [phone] and asked if they will send you free nicotine lozenges or hard candies.  Try using these instead of the cigarettes and see if you can quit completely.  Return to clinic in 3 months after pulmonary function test with Dr. Judeth Horn

## 2023-09-09 NOTE — Progress Notes (Signed)
@Patient  ID: Sherry Zimmerman, female    DOB: 1956/01/18, 67 y.o.   MRN: [id number]  Chief Complaint  Patient presents with  . Consult    Pt is here for Consult. Pt c/o SOB for 1 year.    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  Most recent PCP note reviewed.  Short of breath for about a year now.  Gradually worsening.  Worse on inclines or stairs.  No time of day when better or worse.  No position makes his better or worse.  No environmental or seasonal factors she can identify things better or worse.  Uses inhaler Bevespi and albuterol.  Both infrequently.  Not using Bevespi twice daily.  They seem to help a little bit.  No other alleviating or exacerbating factors.  Reviewed most recent chest image CT scan low-dose lung cancer screening 08/2023 that on my review interpretation shows severe emphysema with most severe in the upper lobes but significant emphysema in the middle lobe on the right and bilateral lower lobes, scattered small nodules lung RADS 3 recommended 61-month follow-up.  This was relayed to her and repeat CT scan has been ordered.  Discussed role and rationale for inhaler therapy and importance of adherence.  Discussed role and rationale for escalating inhalers in the future if needed.  Discussed role and rationale for additional testing to help understand current state of lungs and achieve the most accurate diagnosis we can help her in the future.  Questionaires / Pulmonary Flowsheets:   ACT:      No data to display          MMRC:     No data to display          Epworth:      No data to display          Tests:   FENO:  No results found for: "NITRICOXIDE"  PFT:    Latest Ref Rng & Units 05/24/2018    2:49 PM  PFT Results  FVC-Pre L 3.50   FVC-Predicted Pre % 91   FVC-Post L 3.41   FVC-Predicted Post % 88    Pre FEV1/FVC % % 52   Post FEV1/FCV % % 53   FEV1-Pre L 1.82   FEV1-Predicted Pre % 61   FEV1-Post L 1.80   DLCO uncorrected ml/min/mmHg 14.24   DLCO UNC% % 45   DLVA Predicted % 59   TLC L 6.30   TLC % Predicted % 108   RV % Predicted % 136   Personally viewed and interpreted as mild fixed obstruction, lung volumes with air trapping, DLCO severely reduced  WALK:      No data to display          Imaging: Personally reviewed and as per EMR and discussion in this note CT CHEST LUNG CA SCREEN LOW DOSE W/O CM  Result Date: 08/20/2023 CLINICAL DATA:  67 year old female with 50 pack-year history of smoking. Lung cancer screening. EXAM: CT CHEST WITHOUT CONTRAST LOW-DOSE FOR LUNG CANCER SCREENING TECHNIQUE: Multidetector CT imaging of the chest was performed following the standard protocol without IV contrast. RADIATION DOSE REDUCTION: This exam was performed according to the departmental dose-optimization program which includes automated exposure control, adjustment of the mA and/or kV according to patient size and/or use of iterative reconstruction technique. COMPARISON:  08/07/2022 FINDINGS:  Cardiovascular: The heart size is normal. No substantial pericardial effusion. Coronary artery calcification is evident. Mild atherosclerotic calcification is noted in the wall of the thoracic aorta. Mediastinum/Nodes: No mediastinal lymphadenopathy. No evidence for gross hilar lymphadenopathy although assessment is limited by the lack of intravenous contrast on the current study. The esophagus has normal imaging features. There is no axillary lymphadenopathy. Lungs/Pleura: Centrilobular and paraseptal emphysema evident. Scattered bilateral pulmonary nodules identified previously are stable in the interval. Pleuroparenchymal scarring left lower lobe again noted. 4.3 mm right middle lobe pulmonary nodule on image 154 is new in the interval. No focal airspace consolidation. There is no evidence of pleural  effusion. Upper Abdomen: Visualized portion of the upper abdomen is unremarkable. Musculoskeletal: No worrisome lytic or sclerotic osseous abnormality. IMPRESSION: 1. Lung-RADS 3, probably benign findings. 4.3 mm right middle lobe pulmonary nodule is new in the interval. Short-term follow-up in 6 months is recommended with repeat low-dose chest CT without contrast (please use the following order, "CT CHEST LCS NODULE FOLLOW-UP W/O CM"). 2. Aortic Atherosclerosis (ICD10-I70.0) and Emphysema (ICD10-J43.9). These results will be called to the ordering clinician or representative by the Radiologist Assistant, and communication documented in the PACS or [employer]. Electronically Signed   By: Kennith Center M.D.   On: 08/20/2023 06:10    Lab Results: Personally reviewed CBC    Component Value Date/Time   WBC 9.3 06/29/2023 1324   RBC 4.94 06/29/2023 1324   HGB 15.3 (H) 06/29/2023 1355   HGB 13.5 10/28/2018 1418   HCT 45.0 06/29/2023 1355   PLT 412 (H) 06/29/2023 1324   PLT 463 (H) 10/28/2018 1418   MCV 83.8 06/29/2023 1324   MCH 25.9 (L) 06/29/2023 1324   MCHC 30.9 06/29/2023 1324   RDW 15.8 (H) 06/29/2023 1324   LYMPHSABS 3.6 06/29/2023 1324   MONOABS 0.7 06/29/2023 1324   EOSABS 0.1 06/29/2023 1324   BASOSABS 0.1 06/29/2023 1324    BMET    Component Value Date/Time   NA 139 06/29/2023 1355   K 4.1 06/29/2023 1355   CL 103 06/29/2023 1324   CO2 27 06/29/2023 1324   GLUCOSE 97 06/29/2023 1324   BUN 16 06/29/2023 1324   CREATININE 0.63 06/29/2023 1324   CREATININE 0.80 06/18/2018 1353   CALCIUM 9.3 06/29/2023 1324   GFRNONAA >60 06/29/2023 1324   GFRNONAA >60 06/18/2018 1353   GFRAA >60 06/18/2018 1353    BNP    Component Value Date/Time   BNP 63.8 09/15/2022 1325    ProBNP No results found for: "PROBNP"  Specialty Problems       Pulmonary Problems   AR (allergic rhinitis)   Emphysema of lung (HCC)    Chest ct for lung ca screening 2021       Allergies   Allergen Reactions  . Morphine   . Sulfa Antibiotics Nausea And Vomiting and Other (See Comments)    "Made me sick"    Immunization History  Administered Date(s) Administered  . DTaP / HiB 11/09/2014  . Fluad Quad(high Dose 65+) 10/20/2022  . HIB (PRP-T) 11/09/2014  . Influenza Split 11/19/2015  . Influenza, Seasonal, Injecte, Preservative Fre 10/20/2014  . Influenza,inj,Quad PF,6+ Mos 10/10/2016, 09/11/2017, 08/13/2018, 08/29/2019  . Influenza-Unspecified 09/26/2017, 10/09/2020  . Janssen (J&J) SARS-COV-2 Vaccination 03/15/2020  . Meningococcal Conjugate 11/09/2014, 01/05/2015  . Pneumococcal Conjugate-13 11/09/2014  . Pneumococcal Polysaccharide-23 05/02/2015, 05/23/2020  . Tdap 12/08/2008  . Zoster Recombinant(Shingrix) 08/31/2019, 12/26/2019    Past Medical History:  Diagnosis Date  .  Anxiety   . AR (allergic rhinitis)   . Cerebellar ataxia (HCC) 2010  . COPD (chronic obstructive pulmonary disease) (HCC)   . Depression   . Iron deficiency anemia due to chronic blood loss 06/18/2018  . Major depression, recurrent, chronic (HCC) 08/13/2018   Managed with effexor, chronically  . 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 On statin therapy due to risk of future cardiovascular event 11/18/2019   LDL 115, ASCVD 8.4 2018: started lipitor.  . Osteoporosis 07/08/2021   DEXA 03/2021: lowest T = -3.3, L femur; forearm -2.2; started tx 07/2021  . Raynaud disease 05/13/2018   feet  . Splenic rupture 10/26/2014    Tobacco History: Social History   Tobacco Use  Smoking Status Every Day  . Current packs/day: 1.00  . Average packs/day: 1 pack/day for 50.0 years (50.0 ttl pk-yrs)  . Types: Cigarettes  Smokeless Tobacco Never  Tobacco Comments   Quit on easter of 2022   Ready to quit: Not Answered Counseling given: Not Answered Tobacco comments: Quit on easter of 2022   Continue to not smoke  Outpatient Encounter Medications as of 09/09/2023  Medication Sig  . albuterol  (VENTOLIN HFA) 108 (90 Base) MCG/ACT inhaler Inhale 1-2 puffs into the lungs every 4 (four) hours as needed for wheezing or shortness of breath.  Marland Kitchen atorvastatin (LIPITOR) 10 MG tablet TAKE 1 TABLET BY MOUTH AT BEDTIME  . Glycopyrrolate-Formoterol (BEVESPI AEROSPHERE) 9-4.8 MCG/ACT AERO Inhale 2 puffs into the lungs 2 (two) times daily.  Marland Kitchen oxybutynin (DITROPAN) 5 MG tablet Take 1 tablet by mouth twice daily  . valACYclovir (VALTREX) 1000 MG tablet Take 2 tablets (2,000 mg total) by mouth 2 (two) times daily. Once, as needed for cold sores  . venlafaxine XR (EFFEXOR-XR) 150 MG 24 hr capsule TAKE 1 CAPSULE BY MOUTH AT BEDTIME **TAKE  WITH  75MG   CAPSULES  FOR  225MG   TOTAL  DAILY  DOSE  . venlafaxine XR (EFFEXOR-XR) 75 MG 24 hr capsule TAKE 1 CAPSULE BY MOUTH ONCE DAILY **TAKE  WITH  150MG   CAPSULES  FOR  225MG   TOTAL  DAILY  DOSE (Patient taking differently: Take 75 mg by mouth daily with breakfast.)  . [DISCONTINUED] alendronate (FOSAMAX) 70 MG tablet Take 1 tablet (70 mg total) by mouth every 7 (seven) days. Take with a full glass of water on an empty stomach. Sit upright for 30 minutes  . [DISCONTINUED] calcium-vitamin D (OSCAL WITH D) 500-5 MG-MCG tablet Take 1 tablet by mouth daily with breakfast.  . [DISCONTINUED] rizatriptan (MAXALT) 10 MG tablet Take 10 mg by mouth once as needed for migraine (may repeat once in 2 hours, if no relief).  . [DISCONTINUED] TYLENOL 500 MG tablet Take 500-1,000 mg by mouth every 6 (six) hours as needed for mild pain or headache.   No facility-administered encounter medications on file as of 09/09/2023.     Review of Systems  Review of Systems  No chest pain with exertion.  No orthopnea or PND.  Comprehensive review of systems otherwise negative. Physical Exam  BP 118/80 (BP Location: Left Arm, Cuff Size: Normal)   Pulse (!) 108   Ht 5\' 8"  (1.727 m)   Wt 126 lb 3.2 oz (57.2 kg)   SpO2 95%   BMI 19.19 kg/m   Wt Readings from Last 5 Encounters:  09/09/23  126 lb 3.2 oz (57.2 kg)  08/25/23 122 lb (55.3 kg)  07/13/23 122 lb 8 oz (55.6 kg)  06/29/23 130 lb 1.1 oz (  59 kg)  03/13/23 130 lb (59 kg)    BMI Readings from Last 5 Encounters:  09/09/23 19.19 kg/m  08/25/23 18.55 kg/m  07/13/23 18.63 kg/m  06/29/23 19.78 kg/m  06/29/23 19.77 kg/m     Physical Exam General: Sitting in chair, no acute distress Eyes: EOMI, no icterus Neck: Supple, no JVD appreciated Pulmonary: Distant, clear, normal work of breathing Cardiovascular: Warm, no edema Abdomen: Nondistended, bowel sounds present MSK: No synovitis, no joint effusion Neuro: Normal gait, no weakness Psych: Normal mood, full affect   Assessment & Plan:   Dyspnea on exertion: High suspicion for smoking-related lung disease given severe emphysema seen on CT scan.  Prior PFTs 2020 with very mild COPD.  Repeat PFTs for further evaluation.  Emphysema: Stressed importance of adherence to Bevespi 2 puff twice daily.  Can use albuterol as needed.  Smoking assessment and cessation counseling I have advised the patient to quit/stop smoking as soon as possible due to high risk for multiple medical problems.  It will also be very difficult for [place] to manage patient's  respiratory symptoms and status if we continue to expose her lungs to a known irritant.  Patient is or is not willing to quit smoking. I have advised the patient that we can assist and have options of nicotine replacement therapy, provided smoking cessation education today, provided smoking cessation counseling, and provided cessation resources. Follow-up next office visit office visit for assessment of smoking cessation.  I spent 3 minutes in tobacco cessation counseling.   Return in about 3 months (around 12/10/2023) for f/u Dr. Judeth Horn, after PFT.   Karren Burly, MD 09/09/2023   This appointment required 64 minutes of patient care (this includes precharting, chart review, review of results, face-to-face care,  etc.).

## 2023-09-11 ENCOUNTER — Other Ambulatory Visit: Payer: Self-pay | Admitting: Family Medicine

## 2023-10-01 ENCOUNTER — Ambulatory Visit: Payer: Self-pay

## 2023-10-01 NOTE — Patient Instructions (Signed)
Visit Information  Thank you for taking time to visit with me today. Please don't hesitate to contact me if I can be of assistance to you.   Following are the goals we discussed today:   Goals Addressed             This Visit's Progress    Emphysema Management       Patient Goals/Self Care Activities: -Patient/Caregiver will self-administer medications as prescribed as evidenced by self-report/primary caregiver report  -Patient/Caregiver will attend all scheduled provider appointments as evidenced by clinician review of documented attendance to scheduled appointments and patient/caregiver report -Patient/Caregiver will call provider office for new concerns or questions as evidenced by review of documented incoming telephone call notes and patient report  Care Coordination Interventions: Evaluation of current treatment plan related to Emphysema and patient's adherence to plan as established by provider Provided education to patient re: Emphysema and not smoking.    Patient reports she is doing good. She reports she stopped smoking on 09-20-23.  She has seen the pulmonologist this month.  PFT test scheduled. She uses inhalers as ordered.  Discussed emphysema and management.  She verbalized understanding.  No concerns.        Our next appointment is by telephone on 10/29/23 at 100 pm  Please call the care guide team at [phone] if you need to cancel or reschedule your appointment.   If you are experiencing a Mental Health or Behavioral Health Crisis or need someone to talk to, please call the Suicide and Crisis Lifeline: 988   Patient verbalizes understanding of instructions and care plan provided today and agrees to view in MyChart. Active MyChart status and patient understanding of how to access instructions and care plan via MyChart confirmed with patient.     The patient has been provided with contact information for the care management team and has been advised to call with  any health related questions or concerns.   Bary Leriche, RN, MSN [facility], [facility] Management Community Coordinator Direct Dial: [phone]  Fax: [phone] Website: [email or web address]

## 2023-10-01 NOTE — Patient Outreach (Signed)
Care Coordination   Follow Up Visit Note   10/01/2023 Name: Sherry Zimmerman MRN: [id number] DOB: May 19, 1956  Sherry Zimmerman is a 67 y.o. year old female who sees Willow Ora, MD for primary care. I spoke with  Renita Vowell by phone today.  What matters to the patients health and wellness today?  Patient has completely stopped smoking.    Goals Addressed             This Visit's Progress    Emphysema Management       Patient Goals/Self Care Activities: -Patient/Caregiver will self-administer medications as prescribed as evidenced by self-report/primary caregiver report  -Patient/Caregiver will attend all scheduled provider appointments as evidenced by clinician review of documented attendance to scheduled appointments and patient/caregiver report -Patient/Caregiver will call provider office for new concerns or questions as evidenced by review of documented incoming telephone call notes and patient report  Care Coordination Interventions: Evaluation of current treatment plan related to Emphysema and patient's adherence to plan as established by provider Provided education to patient re: Emphysema and not smoking.    Patient reports she is doing good. She reports she stopped smoking on 09-20-23.  She has seen the pulmonologist this month.  PFT test scheduled. She uses inhalers as ordered.  Discussed emphysema and management.  She verbalized understanding.  No concerns.        SDOH assessments and interventions completed:  Yes     Care Coordination Interventions:  Yes, provided   Follow up plan: Follow up call scheduled for November    Encounter Outcome:  Patient Visit Completed   Bary Leriche, RN, MSN [place]  [facility], [place] [facility] Management Community Coordinator Direct Dial: [phone]  Fax: [phone] Website: [email or web address]

## 2023-10-07 ENCOUNTER — Ambulatory Visit: Payer: Medicare Other | Admitting: Family Medicine

## 2023-10-07 ENCOUNTER — Encounter: Payer: Self-pay | Admitting: Family Medicine

## 2023-10-07 VITALS — BP 134/79 | HR 110 | Temp 98.5°F | Ht 68.0 in | Wt 129.2 lb

## 2023-10-07 DIAGNOSIS — J431 Panlobular emphysema: Secondary | ICD-10-CM | POA: Diagnosis not present

## 2023-10-07 DIAGNOSIS — Z79899 Other long term (current) drug therapy: Secondary | ICD-10-CM

## 2023-10-07 DIAGNOSIS — N3281 Overactive bladder: Secondary | ICD-10-CM

## 2023-10-07 DIAGNOSIS — Z9081 Acquired absence of spleen: Secondary | ICD-10-CM

## 2023-10-07 DIAGNOSIS — Z0000 Encounter for general adult medical examination without abnormal findings: Secondary | ICD-10-CM | POA: Diagnosis not present

## 2023-10-07 DIAGNOSIS — F339 Major depressive disorder, recurrent, unspecified: Secondary | ICD-10-CM | POA: Diagnosis not present

## 2023-10-07 DIAGNOSIS — M810 Age-related osteoporosis without current pathological fracture: Secondary | ICD-10-CM

## 2023-10-07 DIAGNOSIS — D75838 Other thrombocytosis: Secondary | ICD-10-CM | POA: Diagnosis not present

## 2023-10-07 DIAGNOSIS — Z23 Encounter for immunization: Secondary | ICD-10-CM

## 2023-10-07 DIAGNOSIS — G119 Hereditary ataxia, unspecified: Secondary | ICD-10-CM | POA: Diagnosis not present

## 2023-10-07 DIAGNOSIS — Z0001 Encounter for general adult medical examination with abnormal findings: Secondary | ICD-10-CM

## 2023-10-07 MED ORDER — OYSTER SHELL CALCIUM/D3 500-5 MG-MCG PO TABS: 1.0000 | ORAL_TABLET | Freq: Every day | ORAL | Status: AC

## 2023-10-07 MED ORDER — ALENDRONATE SODIUM 70 MG PO TABS: 70.0000 mg | ORAL_TABLET | ORAL | 11 refills | Status: DC

## 2023-10-07 MED ORDER — VALACYCLOVIR HCL 1 G PO TABS: 2000.0000 mg | ORAL_TABLET | Freq: Two times a day (BID) | ORAL | 2 refills | Status: AC

## 2023-10-07 MED ORDER — ATORVASTATIN CALCIUM 10 MG PO TABS: 10.0000 mg | ORAL_TABLET | Freq: Every day | ORAL | 3 refills | Status: DC

## 2023-10-07 MED ORDER — BEVESPI AEROSPHERE 9-4.8 MCG/ACT IN AERO: 2.0000 | INHALATION_SPRAY | Freq: Two times a day (BID) | RESPIRATORY_TRACT | 11 refills | Status: DC

## 2023-10-07 MED ORDER — OXYBUTYNIN CHLORIDE 5 MG PO TABS: 5.0000 mg | ORAL_TABLET | Freq: Two times a day (BID) | ORAL | 3 refills | Status: DC

## 2023-10-07 MED ORDER — VENLAFAXINE HCL ER 75 MG PO CP24: ORAL_CAPSULE | ORAL | 3 refills | Status: DC

## 2023-10-07 MED ORDER — VENLAFAXINE HCL ER 150 MG PO CP24: ORAL_CAPSULE | ORAL | 3 refills | Status: DC

## 2023-10-07 NOTE — Progress Notes (Signed)
Subjective  Chief Complaint  Patient presents with   COPD   Annual Exam   Hyperlipidemia   Depression    HPI: Sherry Zimmerman is a 67 y.o. female who presents to [facility] today for a Female Wellness Visit. She also has the concerns and/or needs as listed above in the chief complaint. These will be addressed in addition to the Health Maintenance Visit.   Wellness Visit: annual visit with health maintenance review and exam  HM: Mammogram is current.  Colonoscopy is scheduled in June.  Reviewed recent lung cancer screening done in September.  Remains free from smoking.  Flu shot eligible today.  RSV eligible.  Overall she says she is doing very well.  Eating better since quit smoking.  Energy is better.  Still with occasional falls due to her ataxia Chronic disease f/u and/or acute problem visit: (deemed necessary to be done in addition to the wellness visit): Ataxia: Uses a walker. Emphysema continues to progress.  She has pulmonary function test scheduled per pulmonology.  On daily inhaler.  Intermittent albuterol use.  Gets winded easily. Major chronic depression is well-controlled on high-dose Effexor.  Needs refills.  No adverse effects. Osteoporosis: She is taking calcium carbonate with vitamin D and tolerating this.  No longer with constipation.  However she never restarted her Fosamax.  She does have osteoporosis and needs this.  She is high risk for fractures.  Bone density is due but since she has been off her Fosamax we will defer another year.  Education given. Due for recheck on statin.  Lipids. Continues on overactive bladder medicine  Assessment  1. Encounter for well adult exam with abnormal findings   2. Need for influenza vaccination   3. Familial cerebellar ataxia (HCC)   4. Panlobular emphysema (HCC)   5. Major depression, recurrent, chronic (HCC)   6. Osteoporosis without current pathological fracture, unspecified osteoporosis type   7.  On statin therapy due to risk of future cardiovascular event   8. OAB (overactive bladder)   9. 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 Included topics are cancer screening recommendations, ways to keep healthy (see AVS) including dietary and exercise recommendations, regular eye and dental care, use of seat belts, and avoidance of moderate alcohol use and tobacco use.  Patient to have scheduled colonoscopy.  Will follow-up on results.  Deferred bone density see below BMI: discussed patient's BMI and encouraged positive lifestyle modifications to help get to or maintain a target BMI. HM needs and immunizations were addressed and ordered. See below for orders. See HM and immunization section for updates.  Flu shot given today Routine labs and screening tests ordered including cmp, cbc and lipids where appropriate. Discussed recommendations regarding Vit D and calcium supplementation (see AVS)  Chronic disease management visit and/or acute problem visit: Ataxia: Continues to progress per patient is advised to be cautious.  Try to avoid falls.  Her husband is conscientious to try to help her and supervise her. COPD/emphysema on inhalers per pulmonology.  Await pulmonary function test.  She is symptomatic.  Recent lung cancer screen was negative. Depression remains well-controlled on Effexor.  Medications refilled, 225 mg daily Continue statin, Lipitor 10 mg nightly.  Recheck lipids today. Osteoporosis: Educated.  Restart Fosamax weekly 70 mg.  Continue calcium and vitamin D.  Defer bone density until next year after completing 1 year  of Fosamax therapy.  Follow up: 12 months for complete physical Orders Placed This Encounter  Procedures   Flu Vaccine Trivalent High Dose (Fluad)   VITAMIN D 25 Hydroxy (Vit-D Deficiency, Fractures)   CBC with Differential/Platelet   Comprehensive  metabolic panel   Lipid panel   TSH   Meds ordered this encounter  Medications   atorvastatin (LIPITOR) 10 MG tablet    Sig: Take 1 tablet (10 mg total) by mouth at bedtime.    Dispense:  90 tablet    Refill:  3   Glycopyrrolate-Formoterol (BEVESPI AEROSPHERE) 9-4.8 MCG/ACT AERO    Sig: Inhale 2 puffs into the lungs 2 (two) times daily.    Dispense:  11 g    Refill:  11   oxybutynin (DITROPAN) 5 MG tablet    Sig: Take 1 tablet (5 mg total) by mouth 2 (two) times daily.    Dispense:  180 tablet    Refill:  3   valACYclovir (VALTREX) 1000 MG tablet    Sig: Take 2 tablets (2,000 mg total) by mouth 2 (two) times daily. Once, as needed for cold sores    Dispense:  20 tablet    Refill:  2   venlafaxine XR (EFFEXOR-XR) 150 MG 24 hr capsule    Sig: TAKE 1 CAPSULE BY MOUTH AT BEDTIME **TAKE  WITH  75MG   CAPSULES  FOR  225MG   TOTAL  DAILY  DOSE    Dispense:  90 capsule    Refill:  3   venlafaxine XR (EFFEXOR-XR) 75 MG 24 hr capsule    Sig: TAKE 1 CAPSULE BY MOUTH ONCE DAILY **TAKE  WITH  150MG   CAPSULES  FOR  225MG   TOTAL  DAILY  DOSE    Dispense:  90 capsule    Refill:  3   alendronate (FOSAMAX) 70 MG tablet    Sig: Take 1 tablet (70 mg total) by mouth every 7 (seven) days. Take with a full glass of water on an empty stomach.    Dispense:  4 tablet    Refill:  11   calcium-vitamin D (OSCAL WITH D) 500-5 MG-MCG tablet    Sig: Take 1 tablet by mouth daily with breakfast.      Body mass index is 19.64 kg/m. Wt Readings from Last 3 Encounters:  10/07/23 129 lb 3.2 oz (58.6 kg)  09/09/23 126 lb 3.2 oz (57.2 kg)  08/25/23 122 lb (55.3 kg)     Patient Active Problem List   Diagnosis Date Noted Date Diagnosed   Osteoporosis 07/08/2021     Priority: High    DEXA 03/2021: lowest T = -3.3, L femur; forearm -2.2; started tx 07/2021    Former smoker 07/08/2021     Priority: High    Quit 03/2021; long term smoker    On statin therapy due to risk of future cardiovascular event  11/18/2019     Priority: High    LDL 115, ASCVD 8.4 2018: started lipitor.    Major depression, recurrent, chronic (HCC) 08/13/2018     Priority: High    Managed with effexor, chronically    Emphysema of lung (HCC) 05/13/2018     Priority: High    Chest ct for lung ca screening 2021    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     Priority: High    Overview:  Followed by neurology, Dr. 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     Priority: Low   Raynaud disease 05/13/2018     Priority: Low    feet    AR (allergic rhinitis) 01/08/2018     Priority: Low   COVID-19 06/29/2023    FTT (failure to thrive) in adult 06/29/2023    Generalized weakness 06/29/2023    Health Maintenance  Topic Date Due   Colonoscopy  03/09/2023   COVID-19 Vaccine (2 - Janssen risk series) 10/23/2023 (Originally 04/12/2020)   DEXA SCAN  10/06/2024 (Originally 04/03/2023)   Lung Cancer Screening  08/10/2024   Medicare Annual Wellness (AWV)  08/24/2024   MAMMOGRAM  09/02/2024   DTaP/Tdap/Td (3 - Td or Tdap) 11/09/2024   Pneumonia Vaccine 58+ Years old  Completed   INFLUENZA VACCINE  Completed   Hepatitis C Screening  Completed   Zoster Vaccines- Shingrix  Completed   HPV VACCINES  Aged Out   Immunization History  Administered Date(s) Administered   DTaP / HiB 11/09/2014   Fluad Quad(high Dose 65+) 10/20/2022   Fluad Trivalent(High Dose 65+) 10/07/2023   HIB (PRP-T) 11/09/2014   Influenza Split  11/19/2015   Influenza, Seasonal, Injecte, Preservative Fre 10/20/2014   Influenza,inj,Quad PF,6+ Mos 10/10/2016, 09/11/2017, 08/13/2018, 08/29/2019   Influenza-Unspecified 09/26/2017, 10/09/2020   Janssen (J&J) SARS-COV-2 Vaccination 03/15/2020   Meningococcal Conjugate 11/09/2014, 01/05/2015   Pneumococcal Conjugate-13 11/09/2014   Pneumococcal Polysaccharide-23 05/02/2015, 05/23/2020   Tdap 12/08/2008   Zoster Recombinant(Shingrix) 08/31/2019, 12/26/2019   We updated and reviewed the patient's past history in detail and it is documented below. Allergies: Patient is allergic to morphine and sulfa antibiotics. 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 Past Surgical History Patient  has a past surgical history that includes Abdominal hysterectomy; Wrist fracture surgery (Right); Splenectomy, total (N/A, 10/26/2014); and Cataract extraction (Bilateral, 01/17/2020). Family History: Patient family history includes Ataxia in her mother; Cancer in her father; Diabetes in her mother; Heart disease in her mother; Stroke in her mother. Social History:  Patient  reports that she quit smoking about 44 years ago. Her smoking use included cigarettes. She started smoking about 2 years ago. She has a 50 pack-year smoking history. She has never used smokeless tobacco. She reports that she does not currently use alcohol. She reports that she does not use drugs.  Review of Systems: Constitutional: negative for fever or malaise Ophthalmic: negative  for photophobia, double vision or loss of vision Cardiovascular: negative for chest pain, dyspnea on exertion, or new LE swelling Respiratory: negative for SOB or persistent cough Gastrointestinal: negative for abdominal pain, change in bowel habits or melena Genitourinary: negative for dysuria or gross hematuria, no abnormal uterine bleeding or disharge Musculoskeletal: negative for new gait disturbance or muscular weakness Integumentary: negative  for new or persistent rashes, no breast lumps Neurological: negative for TIA or stroke symptoms Psychiatric: negative for SI or delusions Allergic/Immunologic: negative for hives  Patient Care Team    Relationship Specialty Notifications Start End  Willow Ora, MD PCP - General Family Medicine  09/18/22   Delories Heinz., MD Referring Physician Psychiatry  12/29/18   Fleeta Emmer, RN [facility] Care Management   07/17/23     Objective  Vitals: BP 134/79   Pulse (!) 110   Temp 98.5 F (36.9 C)   Ht 5\' 8"  (1.727 m)   Wt 129 lb 3.2 oz (58.6 kg)   SpO2 97%   BMI 19.64 kg/m  General:  Well developed, well nourished, no acute distress  Psych:  Alert and orientedx3,normal mood and affect HEENT:  Normocephalic, atraumatic, non-icteric sclera,  supple neck without adenopathy, mass or thyromegaly Cardiovascular:  Normal S1, S2, RRR without gallop, rub or murmur Respiratory:  Good breath sounds bilaterally, CTAB with normal respiratory effort Gastrointestinal: normal bowel sounds, soft, non-tender, no noted masses. No HSM MSK: extremities without edema, joints without erythema or swelling Extremities: No edema  Commons side effects, risks, benefits, and alternatives for medications and treatment plan prescribed today were discussed, and the patient expressed understanding of the given instructions. Patient is instructed to call or message via MyChart if he/she has any questions or concerns regarding our treatment plan. No  barriers to understanding were identified. We discussed Red Flag symptoms and signs in detail. Patient expressed understanding regarding what to do in case of urgent or emergency type symptoms.  Medication list was reconciled, printed and provided to the patient in AVS. Patient instructions and summary information was reviewed with the patient as documented in the AVS. This note was prepared with assistance of Dragon voice recognition software. Occasional wrong-word or sound-a-like substitutions may have occurred due to the inherent limitations of voice recognition software

## 2023-10-07 NOTE — Patient Instructions (Signed)
Please return in 12 months for your annual complete physical; please come fasting.   If you have any questions or concerns, please don't hesitate to send me a message via MyChart or call the office at [phone]. Thank you for visiting with us today! It's our pleasure caring for you.  

## 2023-10-08 LAB — TSH: TSH: 1.4 u[IU]/mL (ref 0.35–5.50)

## 2023-10-08 LAB — CBC WITH DIFFERENTIAL/PLATELET
Basophils Absolute: 0.1 10*3/uL (ref 0.0–0.1)
Basophils Relative: 0.9 % (ref 0.0–3.0)
Eosinophils Absolute: 0.2 10*3/uL (ref 0.0–0.7)
Eosinophils Relative: 1.8 % (ref 0.0–5.0)
HCT: 37.9 % (ref 36.0–46.0)
Hemoglobin: 11.1 g/dL — ABNORMAL LOW (ref 12.0–15.0)
Lymphocytes Relative: 28.5 % (ref 12.0–46.0)
Lymphs Abs: 3.3 10*3/uL (ref 0.7–4.0)
MCHC: 29.4 g/dL — ABNORMAL LOW (ref 30.0–36.0)
MCV: 83 fL (ref 78.0–100.0)
Monocytes Absolute: 1.1 10*3/uL — ABNORMAL HIGH (ref 0.1–1.0)
Monocytes Relative: 9.1 % (ref 3.0–12.0)
Neutro Abs: 7 10*3/uL (ref 1.4–7.7)
Neutrophils Relative %: 59.7 % (ref 43.0–77.0)
Platelets: 491 10*3/uL — ABNORMAL HIGH (ref 150.0–400.0)
RBC: 4.57 Mil/uL (ref 3.87–5.11)
RDW: 17.6 % — ABNORMAL HIGH (ref 11.5–15.5)
WBC: 11.7 10*3/uL — ABNORMAL HIGH (ref 4.0–10.5)

## 2023-10-08 LAB — COMPREHENSIVE METABOLIC PANEL
ALT: 10 U/L (ref 0–35)
AST: 14 U/L (ref 0–37)
Albumin: 3.9 g/dL (ref 3.5–5.2)
Alkaline Phosphatase: 84 U/L (ref 39–117)
BUN: 16 mg/dL (ref 6–23)
CO2: 27 meq/L (ref 19–32)
Calcium: 9.6 mg/dL (ref 8.4–10.5)
Chloride: 106 meq/L (ref 96–112)
Creatinine, Ser: 0.9 mg/dL (ref 0.40–1.20)
GFR: 65.57 mL/min (ref >60.00)
Glucose, Bld: 98 mg/dL (ref 70–99)
Potassium: 4.5 meq/L (ref 3.5–5.1)
Sodium: 143 meq/L (ref 135–145)
Total Bilirubin: 0.3 mg/dL (ref 0.2–1.2)
Total Protein: 6.8 g/dL (ref 6.0–8.3)

## 2023-10-08 LAB — LIPID PANEL
Cholesterol: 171 mg/dL (ref 0–200)
HDL: 63.5 mg/dL (ref >39.00)
LDL Cholesterol: 74 mg/dL (ref 0–99)
NonHDL: 107.02
Total CHOL/HDL Ratio: 3
Triglycerides: 167 mg/dL — ABNORMAL HIGH (ref 0.0–149.0)
VLDL: 33.4 mg/dL (ref 0.0–40.0)

## 2023-10-08 LAB — VITAMIN D 25 HYDROXY (VIT D DEFICIENCY, FRACTURES): VITD: 31.82 ng/mL (ref 30.00–100.00)

## 2023-10-12 NOTE — Progress Notes (Signed)
See mychart note Dear Sherry Zimmerman, Your lab work overall looks stable. You are anemic again, and this maybe related to low iron and/or low Vitamin B12. I recommend restarting oral iron supplements daily if you can tolerate it and an otc vitamin B12 tablet daily.  Get your colonoscopy done as scheduled. Everything else is ok! Sincerely, Dr. Mardelle Matte

## 2023-10-29 ENCOUNTER — Ambulatory Visit: Payer: Self-pay

## 2023-10-29 NOTE — Patient Outreach (Signed)
  Care Coordination   Follow Up Visit Note   10/29/2023 Name: Sherry Zimmerman MRN: [id number] DOB: 03-Nov-1956  Sherry Zimmerman is a 67 y.o. year old female who sees Willow Ora, MD for primary care. I spoke with  Sherry Zimmerman by phone today.  What matters to the patients health and wellness today?  Shortness of breath with exertion    Goals Addressed             This Visit's Progress    COPD Management       Patient Goals/Self Care Activities: -Patient/Caregiver will take medications as prescribed   -Patient/Caregiver will attend all scheduled provider appointments -Patient/Caregiver will call provider office for new concerns or questions   -Avoid smoke and air pollution -Keep your airway clear from mucus build up  -Control your cough by drinking plenty of water -Visit your doctor on a regular basis -Practice and use pursed lip breathing for shortness of breath recovery and prevention -Self assess COPD action plan zone and make appointment with provider if you have been in the yellow zone for 48 hours without improvement.   Patient reports she is okay but still has shortness of breath on exertion.  She saw pulmoologist last month.  PFT scheduled for 11-17-23.  Discussed COPD action plan and when to notify physician.   Discussed rational for PFT to give physician a better idea of lung status in order to treat COPD better.  Encouraged to pace self with activities.  She verbalized understanding.  No concerns.        SDOH assessments and interventions completed:  Yes     Care Coordination Interventions:  Yes, provided   Follow up plan: Follow up call scheduled for January    Encounter Outcome:  Patient Visit Completed   Bary Leriche, RN, MSN [place]  [facility], [facility] Management Community Coordinator Direct Dial: [phone]  Fax: [phone] Website: [email or web address]

## 2023-10-29 NOTE — Patient Instructions (Signed)
Visit Information  Thank you for taking time to visit with me today. Please don't hesitate to contact me if I can be of assistance to you.   Following are the goals we discussed today:   Goals Addressed             This Visit's Progress    COPD Management       Patient Goals/Self Care Activities: -Patient/Caregiver will take medications as prescribed   -Patient/Caregiver will attend all scheduled provider appointments -Patient/Caregiver will call provider office for new concerns or questions   -Avoid smoke and air pollution -Keep your airway clear from mucus build up  -Control your cough by drinking plenty of water -Visit your doctor on a regular basis -Practice and use pursed lip breathing for shortness of breath recovery and prevention -Self assess COPD action plan zone and make appointment with provider if you have been in the yellow zone for 48 hours without improvement.   Patient reports she is okay but still has shortness of breath on exertion.  She saw pulmoologist last month.  PFT scheduled for 11-17-23.  Discussed COPD action plan and when to notify physician.   Discussed rational for PFT to give physician a better idea of lung status in order to treat COPD better.  Encouraged to pace self with activities.  She verbalized understanding.  No concerns.        Our next appointment is by telephone on 12/17/23 at 100 pm  Please call the care guide team at [phone] if you need to cancel or reschedule your appointment.   If you are experiencing a Mental Health or Behavioral Health Crisis or need someone to talk to, please call the Suicide and Crisis Lifeline: 988   Patient verbalizes understanding of instructions and care plan provided today and agrees to view in MyChart. Active MyChart status and patient understanding of how to access instructions and care plan via MyChart confirmed with patient.     The patient has been provided with contact information for the care  management team and has been advised to call with any health related questions or concerns.   Bary Leriche, RN, MSN [facility], [facility] Management Community Coordinator Direct Dial: [phone]  Fax: [phone] Website: [email or web address]

## 2023-11-12 ENCOUNTER — Telehealth: Payer: Self-pay | Admitting: Family Medicine

## 2023-11-12 NOTE — Telephone Encounter (Signed)
Noted  

## 2023-11-12 NOTE — Telephone Encounter (Signed)
FYI: This call has been transferred to triage nurse: the Triage Nurse. Once the result note has been entered staff can address the message at that time.  Patient called in with the following symptoms:  Red Word: Difficulty breathing   Please advise at Mobile [phone] (mobile)  Message is routed to Provider Pool.

## 2023-11-12 NOTE — Telephone Encounter (Addendum)
Patient Advised See PCP Within 24 Hours No appointment availability at [facility] Patient scheduled with Dr. Drue Novel 11/13/23   Patient Name First: Sherry Zimmerman Last: Tietze Gender: Female DOB: [birth year] Age: 67 Y 10 M 10 D Return Phone Number: [phone] (Primary) Address: City/ State/ Zip: [place] [employer] [facility] - Administrator, [facility] at [facility] Provider Asencion Partridge- MD Contact Type Call Who Is Calling Patient / Member / Family / Caregiver Call Type Triage / Clinical Relationship To Patient Self Return Phone Number [phone] (Primary) Chief Complaint BREATHING - fast, heavy or wheezing Reason for Call Symptomatic / Request for Health Information Initial Comment Caller states she is from the answering service and patient is having difficulty breathing Translation No Nurse Assessment Nurse: Noelle Penner, RN, [place] Date/Time (Eastern Time): 11/12/2023 1:19:08 PM Confirm and document reason for call. If symptomatic, describe symptoms. ---Caller states hx of COPD and is having SOB with walking x 2 weeks. Does the patient have any new or worsening symptoms? ---Yes Will a triage be completed? ---Yes Related visit to physician within the last 2 weeks? ---No Does the PT have any chronic conditions? (i.e. diabetes, asthma, this includes High risk factors for pregnancy, etc.) ---Yes List chronic conditions. ---COPD Is this a behavioral health or substance abuse call? ---No Guidelines Guideline Title Affirmed Question Affirmed Notes Nurse Date/Time (Eastern Time) COPD Oxygen Monitoring and Hypoxia [1] MILD difficulty breathing (e.g., minimal/no SOB at rest, SOB with walking) AND [2] worse than normal Noelle Penner, RN, Ethan 11/12/2023 1:20:12 PM Disp. Time Lamount Cohen Time) Disposition Final User 11/12/2023 1:17:03 PM Send to Urgent Ortencia Kick 11/12/2023 1:21:12 PM See PCP within 24 Hours Yes Noelle Penner, RN, [place] Final  Disposition 11/12/2023 1:21:12 PM See PCP within 24 Hours Yes Noelle Penner, RN, [place] Caller Disagree/Comply Comply Caller Understands Yes PreDisposition InappropriateToAsk Care Advice Given Per Guideline SEE PCP WITHIN 24 HOURS: * IF OFFICE WILL BE OPEN: You need to be examined within the next 24 hours. Call your doctor (or NP/PA) when the office opens and make an appointment. CALL BACK IF: * Fever higher than 100.4 F (38.0 C) * You become worse CARE ADVICE given per COPD Oxygen Monitoring and Hypoxia (Adult) guideline Referrals REFERRED TO PCP OFFICE

## 2023-11-13 ENCOUNTER — Observation Stay (HOSPITAL_BASED_OUTPATIENT_CLINIC_OR_DEPARTMENT_OTHER)
Admission: EM | Admit: 2023-11-13 | Discharge: 2023-11-15 | Disposition: A | Discharge status: Home / Self Care | Payer: Medicare Other | Attending: Internal Medicine | Admitting: Internal Medicine

## 2023-11-13 ENCOUNTER — Emergency Department (HOSPITAL_BASED_OUTPATIENT_CLINIC_OR_DEPARTMENT_OTHER): Payer: Medicare Other

## 2023-11-13 ENCOUNTER — Encounter: Payer: Self-pay | Admitting: Internal Medicine

## 2023-11-13 ENCOUNTER — Ambulatory Visit (INDEPENDENT_AMBULATORY_CARE_PROVIDER_SITE_OTHER): Payer: Medicare Other | Admitting: Internal Medicine

## 2023-11-13 ENCOUNTER — Encounter (HOSPITAL_BASED_OUTPATIENT_CLINIC_OR_DEPARTMENT_OTHER): Payer: Self-pay

## 2023-11-13 ENCOUNTER — Other Ambulatory Visit: Payer: Self-pay

## 2023-11-13 VITALS — BP 136/88 | HR 92 | Temp 98.0°F | Resp 22 | Ht 68.0 in | Wt 134.5 lb

## 2023-11-13 DIAGNOSIS — Z87891 Personal history of nicotine dependence: Secondary | ICD-10-CM | POA: Diagnosis not present

## 2023-11-13 DIAGNOSIS — J441 Chronic obstructive pulmonary disease with (acute) exacerbation: Secondary | ICD-10-CM | POA: Diagnosis not present

## 2023-11-13 DIAGNOSIS — J439 Emphysema, unspecified: Secondary | ICD-10-CM | POA: Diagnosis not present

## 2023-11-13 DIAGNOSIS — Z79899 Other long term (current) drug therapy: Secondary | ICD-10-CM | POA: Diagnosis not present

## 2023-11-13 DIAGNOSIS — I2510 Atherosclerotic heart disease of native coronary artery without angina pectoris: Secondary | ICD-10-CM | POA: Diagnosis not present

## 2023-11-13 DIAGNOSIS — Z1152 Encounter for screening for COVID-19: Secondary | ICD-10-CM | POA: Diagnosis not present

## 2023-11-13 DIAGNOSIS — R0602 Shortness of breath: Secondary | ICD-10-CM | POA: Diagnosis not present

## 2023-11-13 DIAGNOSIS — R0902 Hypoxemia: Secondary | ICD-10-CM

## 2023-11-13 DIAGNOSIS — J9601 Acute respiratory failure with hypoxia: Principal | ICD-10-CM | POA: Diagnosis present

## 2023-11-13 LAB — TROPONIN I (HIGH SENSITIVITY)
Troponin I (High Sensitivity): 5 ng/L (ref <18)
Troponin I (High Sensitivity): 5 ng/L (ref <18)

## 2023-11-13 LAB — COMPREHENSIVE METABOLIC PANEL
ALT: 13 U/L (ref 0–44)
AST: 19 U/L (ref 15–41)
Albumin: 3.7 g/dL (ref 3.5–5.0)
Alkaline Phosphatase: 94 U/L (ref 38–126)
Anion gap: 9 (ref 5–15)
BUN: 20 mg/dL (ref 8–23)
CO2: 25 mmol/L (ref 22–32)
Calcium: 8.8 mg/dL — ABNORMAL LOW (ref 8.9–10.3)
Chloride: 105 mmol/L (ref 98–111)
Creatinine, Ser: 0.81 mg/dL (ref 0.44–1.00)
GFR, Estimated: >60 mL/min (ref >60)
Glucose, Bld: 86 mg/dL (ref 70–99)
Potassium: 4.4 mmol/L (ref 3.5–5.1)
Sodium: 139 mmol/L (ref 135–145)
Total Bilirubin: 0.6 mg/dL (ref <1.2)
Total Protein: 7.3 g/dL (ref 6.5–8.1)

## 2023-11-13 LAB — I-STAT VENOUS BLOOD GAS, ED
Acid-Base Excess: 1 mmol/L (ref 0.0–2.0)
Bicarbonate: 26.8 mmol/L (ref 20.0–28.0)
Calcium, Ion: 1.12 mmol/L — ABNORMAL LOW (ref 1.15–1.40)
HCT: 41 % (ref 36.0–46.0)
Hemoglobin: 13.9 g/dL (ref 12.0–15.0)
O2 Saturation: 56 %
Potassium: 4.7 mmol/L (ref 3.5–5.1)
Sodium: 140 mmol/L (ref 135–145)
TCO2: 28 mmol/L (ref 22–32)
pCO2, Ven: 44.4 mm[Hg] (ref 44–60)
pH, Ven: 7.388 (ref 7.25–7.43)
pO2, Ven: 30 mm[Hg] — CL (ref 32–45)

## 2023-11-13 LAB — CBC WITH DIFFERENTIAL/PLATELET
Abs Immature Granulocytes: 0.04 10*3/uL (ref 0.00–0.07)
Basophils Absolute: 0.2 10*3/uL — ABNORMAL HIGH (ref 0.0–0.1)
Basophils Relative: 2 %
Eosinophils Absolute: 0.7 10*3/uL — ABNORMAL HIGH (ref 0.0–0.5)
Eosinophils Relative: 7 %
HCT: 38.7 % (ref 36.0–46.0)
Hemoglobin: 11.7 g/dL — ABNORMAL LOW (ref 12.0–15.0)
Immature Granulocytes: 0 %
Lymphocytes Relative: 28 %
Lymphs Abs: 2.9 10*3/uL (ref 0.7–4.0)
MCH: 24.9 pg — ABNORMAL LOW (ref 26.0–34.0)
MCHC: 30.2 g/dL (ref 30.0–36.0)
MCV: 82.3 fL (ref 80.0–100.0)
Monocytes Absolute: 1 10*3/uL (ref 0.1–1.0)
Monocytes Relative: 10 %
Neutro Abs: 5.5 10*3/uL (ref 1.7–7.7)
Neutrophils Relative %: 53 %
Platelets: 625 10*3/uL — ABNORMAL HIGH (ref 150–400)
RBC: 4.7 MIL/uL (ref 3.87–5.11)
RDW: 16.5 % — ABNORMAL HIGH (ref 11.5–15.5)
WBC: 10.4 10*3/uL (ref 4.0–10.5)
nRBC: 0 % (ref 0.0–0.2)

## 2023-11-13 LAB — RESP PANEL BY RT-PCR (RSV, FLU A&B, COVID)  RVPGX2
Influenza A by PCR: NEGATIVE
Influenza B by PCR: NEGATIVE
Resp Syncytial Virus by PCR: NEGATIVE
SARS Coronavirus 2 by RT PCR: NEGATIVE

## 2023-11-13 LAB — BRAIN NATRIURETIC PEPTIDE: B Natriuretic Peptide: 66.4 pg/mL (ref 0.0–100.0)

## 2023-11-13 MED ORDER — IPRATROPIUM-ALBUTEROL 0.5-2.5 (3) MG/3ML IN SOLN
3.0000 mL | Freq: Once | RESPIRATORY_TRACT | Status: AC
  Administered 2023-11-13: 3 mL via RESPIRATORY_TRACT
  Filled 2023-11-13: qty 3

## 2023-11-13 MED ORDER — IOHEXOL 350 MG/ML SOLN
75.0000 mL | Freq: Once | INTRAVENOUS | Status: AC | PRN
  Administered 2023-11-13: 75 mL via INTRAVENOUS

## 2023-11-13 MED ORDER — IPRATROPIUM-ALBUTEROL 0.5-2.5 (3) MG/3ML IN SOLN
RESPIRATORY_TRACT | Status: AC
  Filled 2023-11-13: qty 3

## 2023-11-13 MED ORDER — ALBUTEROL SULFATE HFA 108 (90 BASE) MCG/ACT IN AERS: 2.0000 | INHALATION_SPRAY | RESPIRATORY_TRACT | Status: DC | PRN

## 2023-11-13 MED ORDER — METHYLPREDNISOLONE SODIUM SUCC 125 MG IJ SOLR
125.0000 mg | Freq: Once | INTRAMUSCULAR | Status: AC
  Administered 2023-11-13: 125 mg via INTRAVENOUS
  Filled 2023-11-13: qty 2

## 2023-11-13 NOTE — ED Notes (Signed)
Initial contact made. A&Ox4, Pt endorses SOB x2 weeks with no other complaints. Pt resting in bed and verbalizes no needs at this time

## 2023-11-13 NOTE — Telephone Encounter (Signed)
noted 

## 2023-11-13 NOTE — ED Provider Notes (Signed)
[place] [facility]MENT AT [facility] Provider Note   CSN: [id number] Arrival date & time: 11/13/23  1514     History  Chief Complaint  Patient presents with   Shortness of Breath    Sherry Zimmerman is a 67 y.o. female.  Patient here with shortness of breath the last few weeks with exertion.  She has not had really specific cough fever chest pain leg swelling.  No recent surgery or travel.  History of COPD.  Has not normally wear oxygen but was found to be hypoxic in the 70s when she went to her primary care doctor's office today.  She has a history of depression, anxiety, anemia.  She denies any black or bloody stools but her stools are usually dark because she is on iron.  The history is provided by the patient.       Home Medications Prior to Admission medications   Medication Sig Start Date End Date Taking? Authorizing Provider  albuterol (VENTOLIN HFA) 108 (90 Base) MCG/ACT inhaler Inhale 1-2 puffs into the lungs every 4 (four) hours as needed for wheezing or shortness of breath. 07/02/23   Marinda Elk, MD  alendronate (FOSAMAX) 70 MG tablet Take 1 tablet (70 mg total) by mouth every 7 (seven) days. Take with a full glass of water on an empty stomach. 10/07/23   Willow Ora, MD  atorvastatin (LIPITOR) 10 MG tablet Take 1 tablet (10 mg total) by mouth at bedtime. 10/07/23   Willow Ora, MD  calcium-vitamin D Ruthell Rummage WITH D) 500-5 MG-MCG tablet Take 1 tablet by mouth daily with breakfast. 10/07/23   Willow Ora, MD  Glycopyrrolate-Formoterol (BEVESPI AEROSPHERE) 9-4.8 MCG/ACT AERO Inhale 2 puffs into the lungs 2 (two) times daily. 10/07/23   Willow Ora, MD  oxybutynin (DITROPAN) 5 MG tablet Take 1 tablet (5 mg total) by mouth 2 (two) times daily. 10/07/23   Willow Ora, MD  valACYclovir (VALTREX) 1000 MG tablet Take 2 tablets (2,000 mg total) by mouth 2 (two) times daily. Once, as needed for cold sores 10/07/23   Willow Ora,  MD  venlafaxine XR (EFFEXOR-XR) 150 MG 24 hr capsule TAKE 1 CAPSULE BY MOUTH AT BEDTIME **TAKE  WITH  75MG   CAPSULES  FOR  225MG   TOTAL  DAILY  DOSE 10/07/23   Willow Ora, MD  venlafaxine XR (EFFEXOR-XR) 75 MG 24 hr capsule TAKE 1 CAPSULE BY MOUTH ONCE DAILY **TAKE  WITH  150MG   CAPSULES  FOR  225MG   TOTAL  DAILY  DOSE 10/07/23   Willow Ora, MD      Allergies    Morphine and Sulfa antibiotics    Review of Systems   Review of Systems  Physical Exam Updated Vital Signs BP (!) 156/88   Pulse 93   Temp 98 F (36.7 C) (Oral)   Resp (!) 21   Ht 5\' 8"  (1.727 m)   Wt 61 kg   SpO2 96%   BMI 20.45 kg/m  Physical Exam Vitals and nursing note reviewed.  Constitutional:      General: She is not in acute distress.    Appearance: She is well-developed.  HENT:     Head: Normocephalic and atraumatic.  Eyes:     Extraocular Movements: Extraocular movements intact.     Conjunctiva/sclera: Conjunctivae normal.     Pupils: Pupils are equal, round, and reactive to light.  Cardiovascular:     Rate and Rhythm: Normal rate and  regular rhythm.     Pulses: Normal pulses.     Heart sounds: Normal heart sounds. No murmur heard. Pulmonary:     Effort: No respiratory distress.     Breath sounds: Decreased breath sounds present.     Comments: Poor air movement throughout Abdominal:     Palpations: Abdomen is soft.     Tenderness: There is no abdominal tenderness.  Musculoskeletal:        General: No swelling.     Cervical back: Normal range of motion and neck supple.     Right lower leg: No edema.     Left lower leg: No edema.  Skin:    General: Skin is warm and dry.     Capillary Refill: Capillary refill takes less than 2 seconds.  Neurological:     General: No focal deficit present.     Mental Status: She is alert.  Psychiatric:        Mood and Affect: Mood normal.     ED Results / Procedures / Treatments   Labs (all labs ordered are listed, but only abnormal results are  displayed) Labs Reviewed  CBC WITH DIFFERENTIAL/PLATELET - Abnormal; Notable for the following components:      Result Value   Hemoglobin 11.7 (*)    MCH 24.9 (*)    RDW 16.5 (*)    Platelets 625 (*)    Eosinophils Absolute 0.7 (*)    Basophils Absolute 0.2 (*)    All other components within normal limits  COMPREHENSIVE METABOLIC PANEL - Abnormal; Notable for the following components:   Calcium 8.8 (*)    All other components within normal limits  I-STAT VENOUS BLOOD GAS, ED - Abnormal; Notable for the following components:   pO2, Ven 30 (*)    Calcium, Ion 1.12 (*)    All other components within normal limits  RESP PANEL BY RT-PCR (RSV, FLU A&B, COVID)  [id number]  BRAIN NATRIURETIC PEPTIDE  TROPONIN I (HIGH SENSITIVITY)  TROPONIN I (HIGH SENSITIVITY)    EKG EKG Interpretation Date/Time:  Friday November 13 2023 15:47:04 EST Ventricular Rate:  89 PR Interval:  156 QRS Duration:  110 QT Interval:  395 QTC Calculation: 481 R Axis:   60  Text Interpretation: Sinus rhythm P Confirmed by Virgina Norfolk [phone]) on 11/13/2023 3:50:11 PM  Radiology CT Angio Chest PE W and/or Wo Contrast  Result Date: 11/13/2023 CLINICAL DATA:  Short of breath for 2 weeks, hypoxia EXAM: CT ANGIOGRAPHY CHEST WITH CONTRAST TECHNIQUE: Multidetector CT imaging of the chest was performed using the standard protocol during bolus administration of intravenous contrast. Multiplanar CT image reconstructions and MIPs were obtained to evaluate the vascular anatomy. RADIATION DOSE REDUCTION: This exam was performed according to the departmental dose-optimization program which includes automated exposure control, adjustment of the mA and/or kV according to patient size and/or use of iterative reconstruction technique. CONTRAST:  75mL OMNIPAQUE IOHEXOL 350 MG/ML SOLN COMPARISON:  11/13/2023, 08/11/2023 FINDINGS: Cardiovascular: This is a technically adequate evaluation of the pulmonary vasculature. No filling defects or  pulmonary emboli. The heart is unremarkable without pericardial effusion. Prominent atherosclerosis throughout the coronary vasculature, greatest in the LAD distribution. Normal caliber of the thoracic aorta. Atherosclerosis of the aortic arch and descending thoracic aorta. Mediastinum/Nodes: No enlarged mediastinal, hilar, or axillary lymph nodes. Thyroid gland, trachea, and esophagus demonstrate no significant findings. Lungs/Pleura: Stable upper lobe predominant emphysema. No acute airspace disease, effusion, or pneumothorax. Chronic areas of scarring throughout the left lower lobe. The 4  mm subpleural right middle lobe pulmonary nodule seen on recent lung cancer screening CT has resolved in the interim. No new pulmonary nodules. Upper Abdomen: No acute abnormality. Musculoskeletal: No acute or destructive bony abnormalities. Reconstructed images demonstrate no additional findings. Review of the MIP images confirms the above findings. IMPRESSION: 1. No evidence of pulmonary embolus. 2.  Emphysema (ICD10-J43.9).  No acute airspace disease. 3. Coronary artery atherosclerosis. Electronically Signed   By: Sharlet Salina M.D.   On: 11/13/2023 18:23   DG Chest Portable 1 View  Result Date: 11/13/2023 CLINICAL DATA:  Shortness of breath for the past 2 weeks. Hypoxia. Ex-smoker. EXAM: PORTABLE CHEST 1 VIEW COMPARISON:  06/29/2023 FINDINGS: Normal sized heart. Stable minimal linear scarring in the left lower lung zone. Otherwise, clear lungs with normal vascularity. No airspace consolidation or pleural fluid. Unremarkable bones. IMPRESSION: No acute abnormality. Electronically Signed   By: Beckie Salts M.D.   On: 11/13/2023 16:42    Procedures Procedures    Medications Ordered in ED Medications  ipratropium-albuterol (DUONEB) 0.5-2.5 (3) MG/3ML nebulizer solution 3 mL (3 mLs Nebulization Given 11/13/23 1611)  methylPREDNISolone sodium succinate (SOLU-MEDROL) 125 mg/2 mL injection 125 mg (125 mg Intravenous  Given 11/13/23 1654)  iohexol (OMNIPAQUE) 350 MG/ML injection 75 mL (75 mLs Intravenous Contrast Given 11/13/23 1719)    ED Course/ Medical Decision Making/ A&P                                 Medical Decision Making Amount and/or Complexity of Data Reviewed Labs: ordered. Radiology: ordered.  Risk Prescription drug management. Decision regarding hospitalization.   Sherry Zimmerman is here with shortness of breath for the last few weeks.  History of COPD.  Differential diagnosis could be reactive airway/COPD process versus pneumonia versus PE versus ACS versus volume overload.  Clinically she does not look volume overloaded.  She has not had chest pain but she has been having exertional shortness of breath the last few weeks.  She denies any cough or sputum production.  She does not have great air movement on exam.  Will give her a breathing treatment will check a CBC BMP troponin BNP COVID and flu test chest x-ray and will likely consider a CT scan of the chest to further evaluate for PE infectious process.  Overall lab work per my review and interpretation is unremarkable.  No evidence of pneumonia or PE on CT scan of her chest.  No abnormal labs otherwise.  No significant anemia or electrolyte abnormality or kidney injury.  Troponin negative x 2.  Overall I suspect respiratory failure in the setting of COPD exacerbation.  Patient given steroids and breathing treatment.  She remained stable on 2 L of oxygen.  Will admit for further care.  This chart was dictated using voice recognition software.  Despite best efforts to proofread,  errors can occur which can change the documentation meaning.         Final Clinical Impression(s) / ED Diagnoses Final diagnoses:  Acute respiratory failure with hypoxia (HCC)  COPD exacerbation [facility])    Rx / DC Orders ED Discharge Orders     None         Virgina Norfolk, DO 11/13/23 1827

## 2023-11-13 NOTE — Progress Notes (Signed)
Plan of Care Note for accepted transfer   Patient: Sherry Zimmerman MRN: [id number]   DOA: 11/13/2023  Facility requesting transfer: [facility] Requesting Provider: Dr. Lockie Mola Reason for transfer: COPD exacerbation with new hypoxia Facility course:  67 year old female with history of COPD presenting with 1-2 weeks of exertional dyspnea.  Seen by her PCP earlier today and found to have hypoxia with SpO2 74% on room air.  Sent to the ED for further evaluation.  SARS-CoV-2, influenza, RSV PCR negative.  CTA chest negative for evidence of PE or acute airspace disease.  SpO2 has remained stable on 2 L supplemental 2 via [place].  Admission requested for further management of acute hypoxic respiratory failure secondary to COPD exacerbation.  Plan of care: The patient is accepted for admission to Med-surg  unit, at [facility] or [facility], first available.  Author: Darreld Mclean, MD 11/13/2023  Check [email or web address] for on-call coverage.  Nursing staff, Please call [facility] Admits & Consults System-Wide number on Amion as soon as patient's arrival, so appropriate admitting provider can evaluate the pt.

## 2023-11-13 NOTE — ED Triage Notes (Signed)
The patient has been short of breath for two weeks. She was seen at her PCP and her sp02 was low. She has had no fever or cough. Sp02 89% on room air in triage.

## 2023-11-13 NOTE — ED Notes (Signed)
Report called over to [facility] and given to [employer]

## 2023-11-13 NOTE — Progress Notes (Unsigned)
Subjective:    Patient ID: Sherry Zimmerman, female    DOB: 1956/10/14, 67 y.o.   MRN: [id number]  DOS:  11/13/2023 Type of visit - description: Acute, here with her has been  The patient has history of COPD, DOE has been much worse in the last 2 weeks, + DOE with minimal exertion like walking from her room to the bathroom.  No recent change of medications. No fever or chills. Cough is about the same as baseline and is dry. She has noticed some wheezing which is slightly worse lately. Does not recall having URI per se.  Recently was noted to have low hemoglobin, denies nausea vomiting, no diarrhea.  No blood in the stools.  No abdominal pain. No lower extremity edema.  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   atorvastatin (LIPITOR) 10 mg, Oral, Daily at bedtime   calcium-vitamin D (OSCAL WITH D) 500-5 MG-MCG tablet 1 tablet, Oral, Daily with breakfast   Glycopyrrolate-Formoterol (BEVESPI AEROSPHERE) 9-4.8 MCG/ACT AERO 2 puffs, Inhalation, 2 times daily   oxybutynin (DITROPAN) 5 mg, Oral, 2 times daily   valACYclovir (VALTREX) 2,000 mg, Oral, 2 times daily, Once, as needed for cold sores   venlafaxine XR (EFFEXOR-XR) 150 MG 24 hr capsule TAKE 1 CAPSULE BY MOUTH AT BEDTIME **TAKE  WITH  75MG   CAPSULES  FOR  225MG   TOTAL  DAILY  DOSE   venlafaxine XR (EFFEXOR-XR) 75 MG 24 hr capsule TAKE 1 CAPSULE BY MOUTH ONCE DAILY **TAKE  WITH  150MG   CAPSULES  FOR  225MG   TOTAL  DAILY  DOSE       Objective:   Physical Exam BP 136/88   Pulse 92   Temp 98 F (36.7 C) (Oral)   Resp (!) 22   Ht 5\' 8"  (1.727 m)   Wt 134 lb 8 oz (61 kg)   SpO2 (!) 74%   BMI 20.45 kg/m  General:   Well developed, slightly frail and underweight female.  O2 sat upon arrival 74%. HEENT:  Normocephalic . Face symmetric, atraumatic Lungs:  Decreased breath sounds, increased expiratory time. Normal respiratory effort, no intercostal retractions, no accessory muscle use. Heart: RRR,  no murmur.  Lower  extremities: no pretibial edema bilaterally  Skin: Not pale. Not jaundice Neurologic:  alert & oriented X3.  Speech normal, gait: Limited, history of Herrity Terry ataxia Psych--  Cognition and judgment appear intact.  Cooperative with normal attention span and concentration.  Behavior appropriate. No anxious or depressed appearing.      Assessment    67 year old female, PMH includes osteoporosis, depression, splenectomy, mild COPD, hereditary ataxia,  COPD, hypoxia. Patient has a history of COPD, smoker, last seen by pulmonary in October. Presents with increased symptoms for the last 2 weeks, DOE with minimal exertion. Upon arrival she walked from the front desk to the examining room and O2 sat  dropped to 74% and she was seen in some distress. After resting, O2 sat increased to 89%. On chart review, I do not see previous O2 sat readings as low as 74%. Also, recent hemoglobin decreased, that may may be playing a role on her symptoms.  I believe she needs further evaluation, I spoke with the emergency room doctor who agreed to see the patient, appreciate her help.

## 2023-11-13 NOTE — Patient Instructions (Signed)
Please go to the emergency room.

## 2023-11-14 DIAGNOSIS — J431 Panlobular emphysema: Secondary | ICD-10-CM | POA: Diagnosis not present

## 2023-11-14 DIAGNOSIS — J441 Chronic obstructive pulmonary disease with (acute) exacerbation: Secondary | ICD-10-CM | POA: Diagnosis not present

## 2023-11-14 DIAGNOSIS — J9601 Acute respiratory failure with hypoxia: Secondary | ICD-10-CM | POA: Diagnosis not present

## 2023-11-14 LAB — RESPIRATORY PANEL BY PCR

## 2023-11-14 LAB — BASIC METABOLIC PANEL
Anion gap: 10 (ref 5–15)
BUN: 20 mg/dL (ref 8–23)
CO2: 22 mmol/L (ref 22–32)
Calcium: 8.7 mg/dL — ABNORMAL LOW (ref 8.9–10.3)
Chloride: 104 mmol/L (ref 98–111)
Creatinine, Ser: 0.74 mg/dL (ref 0.44–1.00)
GFR, Estimated: >60 mL/min (ref >60)
Glucose, Bld: 139 mg/dL — ABNORMAL HIGH (ref 70–99)
Potassium: 3.9 mmol/L (ref 3.5–5.1)
Sodium: 136 mmol/L (ref 135–145)

## 2023-11-14 LAB — CBC
HCT: 39 % (ref 36.0–46.0)
Hemoglobin: 11.7 g/dL — ABNORMAL LOW (ref 12.0–15.0)
MCH: 24.9 pg — ABNORMAL LOW (ref 26.0–34.0)
MCHC: 30 g/dL (ref 30.0–36.0)
MCV: 83 fL (ref 80.0–100.0)
Platelets: 570 10*3/uL — ABNORMAL HIGH (ref 150–400)
RBC: 4.7 MIL/uL (ref 3.87–5.11)
RDW: 16.9 % — ABNORMAL HIGH (ref 11.5–15.5)
WBC: 6.9 10*3/uL (ref 4.0–10.5)
nRBC: 0 % (ref 0.0–0.2)

## 2023-11-14 LAB — HIV ANTIBODY (ROUTINE TESTING W REFLEX): HIV Screen 4th Generation wRfx: NONREACTIVE

## 2023-11-14 MED ORDER — ENOXAPARIN SODIUM 40 MG/0.4ML IJ SOSY
40.0000 mg | PREFILLED_SYRINGE | INTRAMUSCULAR | Status: DC
  Administered 2023-11-14 – 2023-11-15 (×2): 40 mg via SUBCUTANEOUS
  Filled 2023-11-14 (×2): qty 0.4

## 2023-11-14 MED ORDER — PREDNISONE 20 MG PO TABS
40.0000 mg | ORAL_TABLET | Freq: Every day | ORAL | Status: DC
  Administered 2023-11-14 – 2023-11-15 (×2): 40 mg via ORAL
  Filled 2023-11-14 (×2): qty 2

## 2023-11-14 MED ORDER — VITAMIN B-12 1000 MCG PO TABS
500.0000 ug | ORAL_TABLET | Freq: Every day | ORAL | Status: DC
  Administered 2023-11-14 – 2023-11-15 (×2): 500 ug via ORAL
  Filled 2023-11-14 (×2): qty 1

## 2023-11-14 MED ORDER — ADULT MULTIVITAMIN W/MINERALS CH
1.0000 | ORAL_TABLET | Freq: Every day | ORAL | Status: DC
  Administered 2023-11-14 – 2023-11-15 (×2): 1 via ORAL
  Filled 2023-11-14 (×2): qty 1

## 2023-11-14 MED ORDER — MOMETASONE FURO-FORMOTEROL FUM 200-5 MCG/ACT IN AERO
2.0000 | INHALATION_SPRAY | Freq: Two times a day (BID) | RESPIRATORY_TRACT | Status: DC
  Administered 2023-11-14 – 2023-11-15 (×3): 2 via RESPIRATORY_TRACT
  Filled 2023-11-14: qty 8.8

## 2023-11-14 MED ORDER — VENLAFAXINE HCL ER 75 MG PO CP24
225.0000 mg | ORAL_CAPSULE | Freq: Every day | ORAL | Status: DC
  Administered 2023-11-14: 225 mg via ORAL
  Filled 2023-11-14: qty 1

## 2023-11-14 MED ORDER — OXYBUTYNIN CHLORIDE 5 MG PO TABS
5.0000 mg | ORAL_TABLET | Freq: Two times a day (BID) | ORAL | Status: DC
  Administered 2023-11-14 – 2023-11-15 (×2): 5 mg via ORAL
  Filled 2023-11-14 (×2): qty 1

## 2023-11-14 MED ORDER — ALBUTEROL SULFATE (2.5 MG/3ML) 0.083% IN NEBU: 2.5000 mg | INHALATION_SOLUTION | RESPIRATORY_TRACT | Status: DC | PRN

## 2023-11-14 MED ORDER — ENSURE ENLIVE PO LIQD
237.0000 mL | Freq: Two times a day (BID) | ORAL | Status: DC
  Administered 2023-11-14 – 2023-11-15 (×2): 237 mL via ORAL

## 2023-11-14 MED ORDER — UMECLIDINIUM BROMIDE 62.5 MCG/ACT IN AEPB
1.0000 | INHALATION_SPRAY | Freq: Every day | RESPIRATORY_TRACT | Status: DC
  Administered 2023-11-14 – 2023-11-15 (×2): 1 via RESPIRATORY_TRACT
  Filled 2023-11-14: qty 7

## 2023-11-14 MED ORDER — ATORVASTATIN CALCIUM 10 MG PO TABS
10.0000 mg | ORAL_TABLET | Freq: Every day | ORAL | Status: DC
  Administered 2023-11-14: 10 mg via ORAL
  Filled 2023-11-14: qty 1

## 2023-11-14 MED ORDER — FERROUS SULFATE 325 (65 FE) MG PO TABS
325.0000 mg | ORAL_TABLET | Freq: Every day | ORAL | Status: DC
  Administered 2023-11-14 – 2023-11-15 (×2): 325 mg via ORAL
  Filled 2023-11-14 (×2): qty 1

## 2023-11-14 MED ORDER — OYSTER SHELL CALCIUM/D3 500-5 MG-MCG PO TABS
1.0000 | ORAL_TABLET | Freq: Every day | ORAL | Status: DC
  Administered 2023-11-15: 1 via ORAL
  Filled 2023-11-14: qty 1

## 2023-11-14 MED ORDER — ACETAMINOPHEN 325 MG PO TABS
650.0000 mg | ORAL_TABLET | ORAL | Status: DC | PRN
  Administered 2023-11-14 (×2): 650 mg via ORAL
  Filled 2023-11-14 (×2): qty 2

## 2023-11-14 NOTE — Evaluation (Signed)
Physical Therapy Evaluation Patient Details Name: Sherry Zimmerman MRN: [id number] DOB: 11-Mar-1956 Today's Date: 11/14/2023  History of Present Illness  67 y.o. female admitted with worsening SOB/DOE. Dx of respiratory failure with hypoxia.  Pt with medical history significant of COPD / Emphysema, prior smoking quit in Oct, cerebellar ataxia, osteoporosis. Pt had an admission 7/22-7/25/24 for COVID.  Clinical Impression  Pt admitted with above diagnosis. Pt ambulated 15' x 2 with rollator. SpO2 89% on room air at rest, 80% on room air walking, HR 135 walking, 3/4 dyspnea walking. Pt reports at baseline she's able to ambulate farther with her rollator. She does report numerous falls which she attributes to cerebellar ataxia. Close supervision for ambulation recommended.  Pt currently with functional limitations due to the deficits listed below (see PT Problem List). Pt will benefit from acute skilled PT to increase their independence and safety with mobility to allow discharge.           If plan is discharge home, recommend the following: A little help with walking and/or transfers;A little help with bathing/dressing/bathroom;Assistance with cooking/housework;Help with stairs or ramp for entrance;Assist for transportation   Can travel by private vehicle        Equipment Recommendations None recommended by PT  Recommendations for Other Services       Functional Status Assessment Patient has had a recent decline in their functional status and demonstrates the ability to make significant improvements in function in a reasonable and predictable amount of time.     Precautions / Restrictions Precautions Precautions: Fall Precaution Comments: numerous falls in past 6 months which pt attributes to cerebellar ataxia Restrictions Weight Bearing Restrictions: No      Mobility  Bed Mobility Overal bed mobility: Modified Independent             General bed mobility comments: HOB up,  used rail    Transfers Overall transfer level: Needs assistance Equipment used: Rollator (4 wheels) Transfers: Sit to/from Stand Sit to Stand: Supervision           General transfer comment: good safety awareness with handbrakes on rollator    Ambulation/Gait Ambulation/Gait assistance: Contact guard assist Gait Distance (Feet): 15 Feet Assistive device: Rollator (4 wheels) Gait Pattern/deviations: Step-through pattern, Decreased stride length, Ataxic Gait velocity: WFL     General Gait Details: 15' x 2 with rollator; SpO2 89% on room air at rest, 80% on room air walking with HR 135, 3/4 dyspnea with walking  Stairs            Wheelchair Mobility     Tilt Bed    Modified Rankin (Stroke Patients Only)       Balance Overall balance assessment: History of Falls, Needs assistance Sitting-balance support: Feet supported, No upper extremity supported Sitting balance-Leahy Scale: Good     Standing balance support: Bilateral upper extremity supported, During functional activity, Reliant on assistive device for balance Standing balance-Leahy Scale: Poor                               Pertinent Vitals/Pain Pain Assessment Pain Assessment: No/denies pain    Home Living Family/patient expects to be discharged to:: Private residence Living Arrangements: Spouse/significant other Available Help at Discharge: Family;Available 24 hours/day Type of Home: Mobile home Home Access: Ramped entrance       Home Layout: One level Home Equipment: Rollator (4 wheels);Shower seat - built in;Hand held shower head  Prior Function Prior Level of Function : Independent/Modified Independent             Mobility Comments: uses rollator; h/o numerous falls posteriorly which pt attributes to cerebellar ataxia ADLs Comments: independent     Extremity/Trunk Assessment   Upper Extremity Assessment Upper Extremity Assessment: Overall WFL for tasks  assessed    Lower Extremity Assessment Lower Extremity Assessment: Overall WFL for tasks assessed    Cervical / Trunk Assessment Cervical / Trunk Assessment: Normal  Communication   Communication Communication: Difficulty communicating thoughts/reduced clarity of speech (speech is somewhat slurred)  Cognition Arousal: Alert Behavior During Therapy: WFL for tasks assessed/performed Overall Cognitive Status: Within Functional Limits for tasks assessed                                          General Comments      Exercises     Assessment/Plan    PT Assessment Patient needs continued PT services  PT Problem List Decreased mobility;Decreased activity tolerance;Cardiopulmonary status limiting activity       PT Treatment Interventions Therapeutic exercise;Functional mobility training;Therapeutic activities;Gait training;Balance training    PT Goals (Current goals can be found in the Care Plan section)  Acute Rehab PT Goals Patient Stated Goal: to be able to walk farther PT Goal Formulation: With patient Time For Goal Achievement: 11/28/23 Potential to Achieve Goals: Good    Frequency Min 1X/week     Co-evaluation               AM-PAC PT "6 Clicks" Mobility  Outcome Measure Help needed turning from your back to your side while in a flat bed without using bedrails?: None Help needed moving from lying on your back to sitting on the side of a flat bed without using bedrails?: None Help needed moving to and from a bed to a chair (including a wheelchair)?: None Help needed standing up from a chair using your arms (e.g., wheelchair or bedside chair)?: None Help needed to walk in hospital room?: A Little Help needed climbing 3-5 steps with a railing? : A Lot 6 Click Score: 21    End of Session Equipment Utilized During Treatment: Gait belt Activity Tolerance: Patient limited by fatigue Patient left: in chair;with call bell/phone within reach;with  chair alarm set Nurse Communication: Mobility status PT Visit Diagnosis: History of falling (Z91.81);Ataxic gait (R26.0);Difficulty in walking, not elsewhere classified (R26.2)    Time: 8657-8469 PT Time Calculation (min) (ACUTE ONLY): 20 min   Charges:   PT Evaluation $PT Eval Moderate Complexity: 1 Mod   PT General Charges $$ ACUTE PT VISIT: 1 Visit         Tamala Ser PT 11/14/2023  Acute Rehabilitation Services  Office [phone]

## 2023-11-14 NOTE — Assessment & Plan Note (Signed)
-   Recently quit smoking approximately a couple months ago but still exposed to cigarette smoke in the house - Workup seems consistent with COPD exacerbation; no sputum or cough - COVID, flu, RSV negative. RVP also negative - s/p nebs and steroids; rapid improvement

## 2023-11-14 NOTE — Hospital Course (Signed)
Sherry Zimmerman is a 67 yo female with PMH COPD, former tobacco use (recently quit), depression/anxiety, cerebellar ataxia, IDA, osteoporosis who presented with worsening shortness of breath.  She denied any significant cough or sputum production.  She is exposed to cigarette smoke in the house from significant other and family.  Due to her worsening dyspnea, she presented for further evaluation.  She was found to be hypoxic in the 70s on room air in the ER and placed on oxygen; she is not on oxygen at home. CT angio chest performed which showed underlying emphysema but no signs of PE or pneumonia.  She was started on treatment for COPD exacerbation. She responded well to steroids and breathing treatments.

## 2023-11-14 NOTE — Progress Notes (Signed)
SATURATION QUALIFICATIONS: (This note is used to comply with regulatory documentation for home oxygen)  Patient Saturations on Room Air at Rest = 89%  Patient Saturations on Room Air while Ambulating = 80%  Patient Saturations on TBD Liters of oxygen while Ambulating = --%  Please briefly explain why patient needs home oxygen: to maintain appropriate SpO2 levels.   Ralene Bathe Kistler PT 11/14/2023  Acute Rehabilitation Services  Office [phone]

## 2023-11-14 NOTE — Plan of Care (Signed)
  Problem: Education: Goal: Knowledge of General Education information will improve Description: Including pain rating scale, medication(s)/side effects and non-pharmacologic comfort measures Outcome: Progressing   Problem: Health Behavior/Discharge Planning: Goal: Ability to manage health-related needs will improve Outcome: Progressing   Problem: Clinical Measurements: Goal: Ability to maintain clinical measurements within normal limits will improve Outcome: Progressing Goal: Will remain free from infection Outcome: Progressing Goal: Diagnostic test results will improve Outcome: Progressing Goal: Respiratory complications will improve Outcome: Progressing Goal: Cardiovascular complication will be avoided Outcome: Progressing   Problem: Activity: Goal: Risk for activity intolerance will decrease Outcome: Progressing   Problem: Nutrition: Goal: Adequate nutrition will be maintained Outcome: Progressing   Problem: Elimination: Goal: Will not experience complications related to bowel motility Outcome: Progressing   Problem: Pain Management: Goal: General experience of comfort will improve Outcome: Progressing   Problem: Safety: Goal: Ability to remain free from injury will improve Outcome: Progressing   Problem: Skin Integrity: Goal: Risk for impaired skin integrity will decrease Outcome: Progressing

## 2023-11-14 NOTE — Progress Notes (Signed)
Initial Nutrition Assessment  INTERVENTION:   -Ensure Plus High Protein po BID, each supplement provides 350 kcal and 20 grams of protein.   -Multivitamin with minerals daily  NUTRITION DIAGNOSIS:   Increased nutrient needs related to chronic illness as evidenced by estimated needs.  GOAL:   Patient will meet greater than or equal to 90% of their needs  MONITOR:   PO intake, Labs, Weight trends, I & O's, Supplement acceptance  REASON FOR ASSESSMENT:   Consult COPD Protocol  ASSESSMENT:   67 year old female with history of COPD presenting with 1-2 weeks of exertional dyspnea.  Patient having SOB x 2 weeks. Given increased needs from COPD, will order Ensure supplements and daily MVI.   Per weight records, no significant weight changes noted.  Medications reviewed.  Labs reviewed.  NUTRITION - FOCUSED PHYSICAL EXAM:  Pending in person follow-up  Diet Order:   Diet Order             Diet regular Room service appropriate? Yes; Fluid consistency: Thin  Diet effective now                   EDUCATION NEEDS:   No education needs have been identified at this time  Skin:  Skin Assessment: Reviewed RN Assessment  Last BM:  PTA  Height:   Ht Readings from Last 1 Encounters:  11/13/23 5\' 8"  (1.727 m)    Weight:   Wt Readings from Last 1 Encounters:  11/13/23 58.7 kg   BMI:  Body mass index is 19.68 kg/m.  Estimated Nutritional Needs:   Kcal:  1750-1950  Protein:  85-95g  Fluid:  1.9L/day   Tilda Franco, MS, RD, LDN Inpatient Clinical Dietitian Contact via Secure chat

## 2023-11-14 NOTE — Assessment & Plan Note (Addendum)
-   Presumed due to COPD exacerbation - No signs of pneumonia or PE on CT angio chest - Not on oxygen at home - Weaned to RA

## 2023-11-14 NOTE — H&P (Signed)
History and Physical    Patient: Sherry Zimmerman [id number]:[id number] DOB: August 26, 1956 DOA: 11/13/2023 DOS: the patient was seen and examined on 11/14/2023 PCP: Willow Ora, MD  Patient coming from: Home  Chief Complaint:  Chief Complaint  Patient presents with   Shortness of Breath   HPI: Sherry Zimmerman is a 67 y.o. female with medical history significant of COPD / Emphysema, prior smoking quit in Oct.  Despite quitting smoking in Oct she has had significant SOB worse over the past 2 weeks.  DOE even with minimal exertion.  No change in meds. No fever nor chills. Has baseline dry cough. Some wheezing.  Though she is not smoking, sounds like unfortunately her boyfriend and brother ARE smoking in the house.   Satting 74% with exertion in ED, 89% on RA at rest.   Review of Systems: As mentioned in the history of present illness. All other systems reviewed and are negative. 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 Her smoking use included cigarettes. She started smoking about 2 years ago. She has a 50 pack-year smoking history. She has never used smokeless tobacco. She reports that she does not currently use alcohol. She reports that she does not use drugs.  Allergies  Allergen Reactions   Morphine    Sulfa Antibiotics Nausea And Vomiting and Other (See Comments)    "Made me sick"    Family History  Problem Relation Age of Onset   Ataxia Mother    Stroke Mother    Heart disease Mother    Diabetes Mother    Cancer Father     Prior to Admission medications   Medication Sig Start Date End Date Taking? Authorizing Provider  albuterol (VENTOLIN HFA) 108 (90 Base) MCG/ACT inhaler Inhale 1-2 puffs into the lungs every 4 (four) hours as needed for wheezing or shortness of breath. 07/02/23   Marinda Elk, MD  alendronate (FOSAMAX) 70 MG tablet Take 1 tablet (70 mg total) by mouth every 7 (seven) days. Take with a full glass of water on an empty stomach. 10/07/23   Willow Ora, MD  atorvastatin (LIPITOR) 10 MG tablet Take 1 tablet (10 mg total) by mouth at bedtime. 10/07/23   Willow Ora, MD  calcium-vitamin D Ruthell Rummage WITH D) 500-5 MG-MCG tablet Take 1 tablet by mouth daily with breakfast. 10/07/23   Willow Ora, MD  Glycopyrrolate-Formoterol (BEVESPI AEROSPHERE) 9-4.8 MCG/ACT AERO Inhale 2 puffs  into the lungs 2 (two) times daily. 10/07/23   Willow Ora, MD  oxybutynin (DITROPAN) 5 MG tablet Take 1 tablet (5 mg total) by mouth 2 (two) times daily. 10/07/23   Willow Ora, MD  valACYclovir (VALTREX) 1000 MG tablet Take 2 tablets (2,000 mg total) by mouth 2 (two) times daily. Once, as needed for cold sores 10/07/23   Willow Ora, MD  venlafaxine XR (EFFEXOR-XR) 150 MG 24 hr capsule TAKE 1 CAPSULE BY MOUTH AT BEDTIME **TAKE  WITH  75MG   CAPSULES  FOR  225MG   TOTAL  DAILY  DOSE 10/07/23   Willow Ora, MD  venlafaxine XR (EFFEXOR-XR) 75 MG 24 hr capsule TAKE 1 CAPSULE BY  MOUTH ONCE DAILY **TAKE  WITH  150MG   CAPSULES  FOR  225MG   TOTAL  DAILY  DOSE 10/07/23   Willow Ora, MD    Physical Exam: Vitals:   11/13/23 2130 11/13/23 2237 11/14/23 0048 11/14/23 0506  BP: (!) 140/88 (!) 149/82 132/89 136/84  Pulse: (!) 114 (!) 114 100 94  Resp: (!) 22 18 16 16   Temp:  98 F (36.7 C) 98.2 F (36.8 C) 97.6 F (36.4 C)  TempSrc:  Oral Oral Oral  SpO2: 91% 93% 92% 95%  Weight:  58.7 kg    Height:  5\' 8"  (1.727 m)     Constitutional: NAD, calm, comfortable Respiratory: Diminished throughout Cardiovascular: Regular rate and rhythm, no murmurs / rubs / gallops. No extremity edema. 2+ pedal pulses. No carotid bruits.  Abdomen: no tenderness, no masses palpated. No hepatosplenomegaly. Bowel sounds positive.  Neurologic: CN 2-12 grossly intact. Sensation intact, DTR normal. Strength 5/5 in all 4.  Psychiatric: Normal judgment and insight. Alert and oriented x 3. Normal mood.   Data Reviewed:    Labs on Admission: I have personally reviewed following labs and imaging studies  CBC: Recent Labs  Lab 11/13/23 1531 11/13/23 1551  WBC 10.4  --   NEUTROABS 5.5  --   HGB 11.7* 13.9  HCT 38.7 41.0  MCV 82.3  --   PLT 625*  --    Basic Metabolic Panel: Recent Labs  Lab 11/13/23 1531 11/13/23 1551  NA 139 140  K 4.4 4.7  CL 105  --   CO2 25  --   GLUCOSE 86  --   BUN 20  --   CREATININE 0.81  --   CALCIUM 8.8*  --    GFR: Estimated Creatinine Clearance: 61.6 mL/min (by C-G formula based on SCr of 0.81 mg/dL). Liver Function Tests: Recent Labs  Lab 11/13/23 1531  AST 19  ALT 13  ALKPHOS 94  BILITOT 0.6  PROT 7.3  ALBUMIN 3.7   No results for input(s): "LIPASE", "AMYLASE" in the last 168 hours. No results for input(s): "AMMONIA" in the last 168 hours. Coagulation Profile: No results for input(s): "INR", "PROTIME" in the last 168 hours. Cardiac Enzymes: No results for input(s): "CKTOTAL", "CKMB", "CKMBINDEX", "TROPONINI" in the last 168  hours. BNP (last 3 results) No results for input(s): "PROBNP" in the last 8760 hours. HbA1C: No results for input(s): "HGBA1C" in the last 72 hours. CBG: No results for input(s): "GLUCAP" in the last 168 hours. Lipid Profile: No results for input(s): "CHOL", "HDL", "LDLCALC", "TRIG", "CHOLHDL", "LDLDIRECT" in the last 72 hours. Thyroid Function Tests: No results for input(s): "TSH", "T4TOTAL", "FREET4", "T3FREE", "THYROIDAB" in the last 72 hours. Anemia Panel: No results for input(s): "VITAMINB12", "FOLATE", "FERRITIN", "TIBC", "IRON", "RETICCTPCT" in the  last 72 hours. Urine analysis:    Component Value Date/Time   COLORURINE STRAW (A) 09/15/2022 1451   APPEARANCEUR CLEAR 09/15/2022 1451   LABSPEC 1.011 09/15/2022 1451   PHURINE 6.0 09/15/2022 1451   GLUCOSEU NEGATIVE 09/15/2022 1451   HGBUR SMALL (A) 09/15/2022 1451   BILIRUBINUR NEGATIVE 09/15/2022 1451   BILIRUBINUR Negative 08/29/2019 1127   KETONESUR NEGATIVE 09/15/2022 1451   PROTEINUR NEGATIVE 09/15/2022 1451   UROBILINOGEN 0.2 08/29/2019 1127   NITRITE NEGATIVE 09/15/2022 1451   LEUKOCYTESUR SMALL (A) 09/15/2022 1451    Radiological Exams on Admission: CT Angio Chest PE W and/or Wo Contrast  Result Date: 11/13/2023 CLINICAL DATA:  Short of breath for 2 weeks, hypoxia EXAM: CT ANGIOGRAPHY CHEST WITH CONTRAST TECHNIQUE: Multidetector CT imaging of the chest was performed using the standard protocol during bolus administration of intravenous contrast. Multiplanar CT image reconstructions and MIPs were obtained to evaluate the vascular anatomy. RADIATION DOSE REDUCTION: This exam was performed according to the departmental dose-optimization program which includes automated exposure control, adjustment of the mA and/or kV according to patient size and/or use of iterative reconstruction technique. CONTRAST:  75mL OMNIPAQUE IOHEXOL 350 MG/ML SOLN COMPARISON:  11/13/2023, 08/11/2023 FINDINGS: Cardiovascular: This is a technically  adequate evaluation of the pulmonary vasculature. No filling defects or pulmonary emboli. The heart is unremarkable without pericardial effusion. Prominent atherosclerosis throughout the coronary vasculature, greatest in the LAD distribution. Normal caliber of the thoracic aorta. Atherosclerosis of the aortic arch and descending thoracic aorta. Mediastinum/Nodes: No enlarged mediastinal, hilar, or axillary lymph nodes. Thyroid gland, trachea, and esophagus demonstrate no significant findings. Lungs/Pleura: Stable upper lobe predominant emphysema. No acute airspace disease, effusion, or pneumothorax. Chronic areas of scarring throughout the left lower lobe. The 4 mm subpleural right middle lobe pulmonary nodule seen on recent lung cancer screening CT has resolved in the interim. No new pulmonary nodules. Upper Abdomen: No acute abnormality. Musculoskeletal: No acute or destructive bony abnormalities. Reconstructed images demonstrate no additional findings. Review of the MIP images confirms the above findings. IMPRESSION: 1. No evidence of pulmonary embolus. 2.  Emphysema (ICD10-J43.9).  No acute airspace disease. 3. Coronary artery atherosclerosis. Electronically Signed   By: Sharlet Salina M.D.   On: 11/13/2023 18:23   DG Chest Portable 1 View  Result Date: 11/13/2023 CLINICAL DATA:  Shortness of breath for the past 2 weeks. Hypoxia. Ex-smoker. EXAM: PORTABLE CHEST 1 VIEW COMPARISON:  06/29/2023 FINDINGS: Normal sized heart. Stable minimal linear scarring in the left lower lung zone. Otherwise, clear lungs with normal vascularity. No airspace consolidation or pleural fluid. Unremarkable bones. IMPRESSION: No acute abnormality. Electronically Signed   By: Beckie Salts M.D.   On: 11/13/2023 16:42    EKG: Independently reviewed.   Assessment and Plan: * Acute respiratory failure with hypoxia (HCC) AECOPD with acute hypoxic resp failure (maybe chronic hypoxic resp failure, tough to say at this point). COPD  pathway Prednisone PRN SABA Scheduled LABA, LAMA, and INH steroid Emphasized that it was great that she had quit smoking in Oct and absolutely critical that she continued to not smoke. However also noted that it was critically important that no one else around her smoke (no second hand smoke exposure), so boyfriend and brother would have to go smoke elsewhere if they lived in same house. CT chest does show some good news in that the 4mm previously seen pulmonary nodule on CT earlier this fall seems to have resolved and no new pulmonary nodules today.  Advance Care Planning:   Code Status: Full Code  Consults: None  Family Communication: No family in room  Severity of Illness: The appropriate patient status for this patient is OBSERVATION. Observation status is judged to be reasonable and necessary in order to provide the required intensity of service to ensure the patient's safety. The patient's presenting symptoms, physical exam findings, and initial radiographic and laboratory data in the context of their medical condition is felt to place them at decreased risk for further clinical deterioration. Furthermore, it is anticipated that the patient will be medically stable for discharge from the hospital within 2 midnights of admission.   Author: Hillary Bow., DO 11/14/2023 6:13 AM  For on call review [email or web address].

## 2023-11-14 NOTE — Progress Notes (Signed)
Progress Note    Sherry Zimmerman   ZOX:[id number]  DOB: 01-14-1956  DOA: 11/13/2023     0 PCP: Willow Ora, MD  Initial CC: SOB  Hospital Course: Ms Kaina is a 67 yo female with PMH COPD, former tobacco use (recently quit), depression/anxiety, cerebellar ataxia, IDA, osteoporosis who presented with worsening shortness of breath.  She denied any significant cough or sputum production.  She is exposed to cigarette smoke in the house from significant other and family.  Due to her worsening dyspnea, she presented for further evaluation.  She was found to be hypoxic in the 70s on room air in the ER and placed on oxygen; she is not on oxygen at home. CT angio chest performed which showed underlying emphysema but no signs of PE or pneumonia.  She was started on treatment for COPD exacerbation.  Interval History:  Sitting in recliner when seen this morning feeling a little bit better.  Wheezing appears to have improved compared to admission per patient.  Assessment and Plan: * Acute respiratory failure with hypoxia (HCC) - Presumed due to COPD exacerbation - No signs of pneumonia or PE on CT angio chest - Not on oxygen at home - Wean oxygen as able  COPD with acute exacerbation (HCC) - Recently quit smoking approximately a couple months ago but still exposed to cigarette smoke in the house - Workup seems consistent with COPD exacerbation; no sputum or cough - COVID, flu, RSV negative.  Check RVP panel - Continue scheduled nebulizers - Continue steroids  Emphysema of lung (HCC) - per CTA chest - of note: "The 4 mm subpleural right middle lobe pulmonary nodule seen on recent lung cancer screening CT has resolved in the interim. No new pulmonary nodules."    Old records reviewed in assessment of this patient  Antimicrobials:   DVT prophylaxis:  enoxaparin (LOVENOX) injection 40 mg Start: 11/14/23 1000   Code Status:   Code Status: Full Code  Mobility Assessment (Last  72 Hours)     Mobility Assessment     Row Name 11/14/23 1149 11/14/23 0812 11/13/23 2229       Does patient have an order for bedrest or is patient medically unstable -- No - Continue assessment No - Continue assessment     What is the highest level of mobility based on the progressive mobility assessment? Level 4 (Walks with assist in room) - Balance while marching in place and cannot step forward and back - Complete Level 5 (Walks with assist in room/hall) - Balance while stepping forward/back and can walk in room with assist - Complete Level 5 (Walks with assist in room/hall) - Balance while stepping forward/back and can walk in room with assist - Complete              Barriers to discharge: None Disposition Plan: Home Status is: Observation  Objective: Blood pressure (!) 151/76, pulse 100, temperature 97.7 F (36.5 C), temperature source Oral, resp. rate 20, height 5\' 8"  (1.727 m), weight 58.7 kg, SpO2 96%.  Examination:  Physical Exam Constitutional:      Appearance: Normal appearance.  HENT:     Head: Normocephalic and atraumatic.     Mouth/Throat:     Mouth: Mucous membranes are moist.  Eyes:     Extraocular Movements: Extraocular movements intact.  Cardiovascular:     Rate and Rhythm: Normal rate and regular rhythm.  Pulmonary:     Effort: Pulmonary effort is normal. No respiratory distress.  Breath sounds: No wheezing.     Comments: Coarse breath sounds bilaterally Abdominal:     General: Bowel sounds are normal. There is no distension.     Palpations: Abdomen is soft.     Tenderness: There is no abdominal tenderness.  Musculoskeletal:        General: Normal range of motion.     Cervical back: Normal range of motion and neck supple.  Skin:    General: Skin is warm and dry.  Neurological:     General: No focal deficit present.     Mental Status: She is alert.  Psychiatric:        Mood and Affect: Mood normal.      Consultants:    Procedures:     Data Reviewed: Results for orders placed or performed during the hospital encounter of 11/13/23 (from the past 24 hour(s))  Troponin I (High Sensitivity)     Status: None   Collection Time: 11/13/23  5:28 PM  Result Value Ref Range   Troponin I (High Sensitivity) 5 <18 ng/L  CBC     Status: Abnormal   Collection Time: 11/14/23  5:42 AM  Result Value Ref Range   WBC 6.9 4.0 - 10.5 K/uL   RBC 4.70 3.87 - 5.11 MIL/uL   Hemoglobin 11.7 (L) 12.0 - 15.0 g/dL   HCT 61.6 07.3 - 71.0 %   MCV 83.0 80.0 - 100.0 fL   MCH 24.9 (L) 26.0 - 34.0 pg   MCHC 30.0 30.0 - 36.0 g/dL   RDW 62.6 (H) 94.8 - 54.6 %   Platelets 570 (H) 150 - 400 K/uL   nRBC 0.0 0.0 - 0.2 %  Basic metabolic panel     Status: Abnormal   Collection Time: 11/14/23  5:42 AM  Result Value Ref Range   Sodium 136 135 - 145 mmol/L   Potassium 3.9 3.5 - 5.1 mmol/L   Chloride 104 98 - 111 mmol/L   CO2 22 22 - 32 mmol/L   Glucose, Bld 139 (H) 70 - 99 mg/dL   BUN 20 8 - 23 mg/dL   Creatinine, Ser 2.70 0.44 - 1.00 mg/dL   Calcium 8.7 (L) 8.9 - 10.3 mg/dL   GFR, Estimated >35 >00 mL/min   Anion gap 10 5 - 15  HIV Antibody (routine testing w rflx)     Status: None   Collection Time: 11/14/23  5:42 AM  Result Value Ref Range   HIV Screen 4th Generation wRfx Non Reactive Non Reactive    I have reviewed pertinent nursing notes, vitals, labs, and images as necessary. I have ordered labwork to follow up on as indicated.  I have reviewed the last notes from staff over past 24 hours. I have discussed patient's care plan and test results with nursing staff, CM/SW, and other staff as appropriate.  Time spent: Greater than 50% of the 55 minute visit was spent in counseling/coordination of care for the patient as laid out in the A&P.   LOS: 0 days   Lewie Chamber, MD [facility] 11/14/2023, 4:18 PM

## 2023-11-14 NOTE — Assessment & Plan Note (Signed)
-   per CTA chest - of note: "The 4 mm subpleural right middle lobe pulmonary nodule seen on recent lung cancer screening CT has resolved in the interim. No new pulmonary nodules."

## 2023-11-15 ENCOUNTER — Encounter: Payer: Self-pay | Admitting: Pulmonary Disease

## 2023-11-15 DIAGNOSIS — J441 Chronic obstructive pulmonary disease with (acute) exacerbation: Secondary | ICD-10-CM | POA: Diagnosis not present

## 2023-11-15 DIAGNOSIS — J9601 Acute respiratory failure with hypoxia: Secondary | ICD-10-CM | POA: Diagnosis not present

## 2023-11-15 NOTE — Evaluation (Signed)
Occupational Therapy Evaluation Patient Details Name: Sherry Zimmerman MRN: [id number] DOB: Oct 15, 1956 Today's Date: 11/15/2023   History of Present Illness Patient is a 67 year old female who presented on 12/6 with shortness of breath. Patient was admitted with acute respiratory failure with hypoxia, COPD with acute exacerbation. PMH: emphysema of lung, former tobacco user., cerebellar ataxia, IDA, osteoporosis.   Clinical Impression   Patient evaluated by Occupational Therapy with no further acute OT needs identified. All education has been completed and the patient has no further questions. Patient endorsed being at baseline with h/o cerebellar ataxia.  See below for any follow-up Occupational Therapy or equipment needs. OT is signing off. Thank you for this referral.        If plan is discharge home, recommend the following: A little help with walking and/or transfers;A little help with bathing/dressing/bathroom;Assist for transportation;Help with stairs or ramp for entrance    Functional Status Assessment  Patient has not had a recent decline in their functional status  Equipment Recommendations  None recommended by OT       Precautions / Restrictions Precautions Precautions: Fall Restrictions Weight Bearing Restrictions: No      Mobility Bed Mobility Overal bed mobility: Modified Independent                  Transfers Overall transfer level: Needs assistance Equipment used: Rollator (4 wheels) Transfers: Sit to/from Stand Sit to Stand: Supervision                  Balance Overall balance assessment: History of Falls, Needs assistance Sitting-balance support: Feet supported, No upper extremity supported Sitting balance-Leahy Scale: Good     Standing balance support: Bilateral upper extremity supported, During functional activity, Reliant on assistive device for balance Standing balance-Leahy Scale: Poor                             ADL  either performed or assessed with clinical judgement   ADL Overall ADL's : At baseline         General ADL Comments: patient was able to complete bed mobility with MI. patient has a history of cerebellar ataxia making movements unsteady and jerky at times but patient has had these prior to hospitalization. patient able to complete lB dressing tasks seated with increased time in recliner. patient was noted to drop to 88% on RA after functional mobility with supervision with personal rollator in hallway with patient able to sit in recliner and recover to 91% on RA within 30 seconds. patient was educated on deep breathing strategies and resting when SOB starts. patient verbalized understanding. patient educated on pulse ox use. patietn reported she would look into getting one. patient reported she did not need OT at this time. OT to sign off.     Vision   Vision Assessment?: No apparent visual deficits            Pertinent Vitals/Pain Pain Assessment Pain Assessment: No/denies pain     Extremity/Trunk Assessment Upper Extremity Assessment Upper Extremity Assessment: Overall WFL for tasks assessed   Lower Extremity Assessment Lower Extremity Assessment: Defer to PT evaluation   Cervical / Trunk Assessment Cervical / Trunk Assessment: Normal   Communication Communication Communication: Difficulty communicating thoughts/reduced clarity of speech   Cognition Arousal: Alert Behavior During Therapy: WFL for tasks assessed/performed Overall Cognitive Status: Within Functional Limits for tasks assessed  Home Living Family/patient expects to be discharged to:: Private residence Living Arrangements: Spouse/significant other;Other (Comment) (and brother) Available Help at Discharge: Family;Available 24 hours/day Type of Home: Mobile home Home Access: Ramped entrance     Home Layout: One level     Bathroom Shower/Tub: Tub/shower unit;Walk-in shower   Bathroom  Toilet: Standard     Home Equipment: Rollator (4 wheels);Shower seat - built in;Hand held shower head          Prior Functioning/Environment Prior Level of Function : Independent/Modified Independent             Mobility Comments: uses rollator; h/o numerous falls posteriorly which pt attributes to cerebellar ataxia ADLs Comments: independent                 OT Goals(Current goals can be found in the care plan section) Acute Rehab OT Goals OT Goal Formulation: All assessment and education complete, DC therapy  OT Frequency:         AM-PAC OT "6 Clicks" Daily Activity     Outcome Measure Help from another person eating meals?: None Help from another person taking care of personal grooming?: A Little Help from another person toileting, which includes using toliet, bedpan, or urinal?: A Little Help from another person bathing (including washing, rinsing, drying)?: A Little Help from another person to put on and taking off regular upper body clothing?: A Little Help from another person to put on and taking off regular lower body clothing?: A Little 6 Click Score: 19   End of Session Equipment Utilized During Treatment: Gait belt;Other (comment) (personal rollator) Nurse Communication: Mobility status  Activity Tolerance: Patient tolerated treatment well Patient left: in chair;with call bell/phone within reach;with chair alarm set  OT Visit Diagnosis: Unsteadiness on feet (R26.81)                Time: 1610-9604 OT Time Calculation (min): 31 min Charges:  OT General Charges $OT Visit: 1 Visit OT Evaluation $OT Eval Low Complexity: 1 Low OT Treatments $Self Care/Home Management : 8-22 mins  Rosalio Loud, MS Acute Rehabilitation Department Office# [phone]   Selinda Flavin 11/15/2023, 9:06 AM

## 2023-11-15 NOTE — Discharge Summary (Signed)
Physician Discharge Summary   Sherry Zimmerman ZOX:[id number] DOB: [birth year] DOA: 11/13/2023  PCP: Sherry Ora, MD  Admit date: 11/13/2023 Discharge date: 11/15/2023   Admitted From: Home Disposition:  Home Discharging physician: Sherry Chamber, MD Barriers to discharge: none  Discharge Condition: stable CODE STATUS: Full Diet recommendation:  Diet Orders (From admission, onward)     Start     Ordered   11/15/23 0000  Diet general        11/15/23 1225   11/14/23 0601  Diet regular Room service appropriate? Yes; Fluid consistency: Thin  Diet effective now       Question Answer Comment  Room service appropriate? Yes   Fluid consistency: Thin      11/14/23 0600            Hospital Course: Sherry Zimmerman is a 67 yo female with PMH COPD, former tobacco use (recently quit), depression/anxiety, cerebellar ataxia, IDA, osteoporosis who presented with worsening shortness of breath.  She denied any significant cough or sputum production.  She is exposed to cigarette smoke in the house from significant other and family.  Due to her worsening dyspnea, she presented for further evaluation.  She was found to be hypoxic in the 70s on room air in the ER and placed on oxygen; she is not on oxygen at home. CT angio chest performed which showed underlying emphysema but no signs of PE or pneumonia.  She was started on treatment for COPD exacerbation. She responded well to steroids and breathing treatments.  Assessment and Plan: * Acute respiratory failure with hypoxia (HCC)-resolved as of 11/15/2023 - Presumed due to COPD exacerbation - No signs of pneumonia or PE on CT angio chest - Not on oxygen at home - Weaned to RA  COPD with acute exacerbation (HCC) - Recently quit smoking approximately a couple months ago but still exposed to cigarette smoke in the house - Workup seems consistent with COPD exacerbation; no sputum or cough - COVID, flu, RSV negative. RVP also negative - s/p nebs  and steroids; rapid improvement  Emphysema of lung (HCC) - per CTA chest - of note: "The 4 mm subpleural right middle lobe pulmonary nodule seen on recent lung cancer screening CT has resolved in the interim. No new pulmonary nodules."   The patient's acute and chronic medical conditions were treated accordingly. On day of discharge, patient was felt deemed stable for discharge. Patient/family member advised to call PCP or come back to ER if needed.   Principal Diagnosis: Acute respiratory failure with hypoxia [facility])  Discharge Diagnoses: Active Hospital Problems   Diagnosis Date Noted   COPD with acute exacerbation (HCC) 11/14/2023    Priority: 1.   Emphysema of lung (HCC) 05/13/2018    Resolved Hospital Problems   Diagnosis Date Noted Date Resolved   Acute respiratory failure with hypoxia (HCC) 11/13/2023 11/15/2023    Priority: 1.     Discharge Instructions     Diet general   Complete by: As directed    Increase activity slowly   Complete by: As directed       Allergies as of 11/15/2023       Reactions   Morphine    Sulfa Antibiotics Nausea And Vomiting, Other (See Comments)   "Made me sick"        Medication List     TAKE these medications    albuterol 108 (90 Base) MCG/ACT inhaler Commonly known as: VENTOLIN HFA Inhale 1-2 puffs into the lungs every 4 (  four) hours as needed for wheezing or shortness of breath.   alendronate 70 MG tablet Commonly known as: FOSAMAX Take 1 tablet (70 mg total) by mouth every 7 (seven) days. Take with a full glass of water on an empty stomach.   atorvastatin 10 MG tablet Commonly known as: LIPITOR Take 1 tablet (10 mg total) by mouth at bedtime.   Bevespi Aerosphere 9-4.8 MCG/ACT Aero Generic drug: Glycopyrrolate-Formoterol Inhale 2 puffs into the lungs 2 (two) times daily.   calcium-vitamin D 500-5 MG-MCG tablet Commonly known as: OSCAL WITH D Take 1 tablet by mouth daily with breakfast.   cyanocobalamin 500 MCG  tablet Commonly known as: VITAMIN B12 Take 500 mcg by mouth daily.   ferrous sulfate 325 (65 FE) MG EC tablet Take 325 mg by mouth daily.   oxybutynin 5 MG tablet Commonly known as: DITROPAN Take 1 tablet (5 mg total) by mouth 2 (two) times daily.   valACYclovir 1000 MG tablet Commonly known as: Valtrex Take 2 tablets (2,000 mg total) by mouth 2 (two) times daily. Once, as needed for cold sores   venlafaxine XR 150 MG 24 hr capsule Commonly known as: EFFEXOR-XR TAKE 1 CAPSULE BY MOUTH AT BEDTIME **TAKE  WITH  75MG   CAPSULES  FOR  225MG   TOTAL  DAILY  DOSE What changed:  how much to take how to take this when to take this additional instructions   venlafaxine XR 75 MG 24 hr capsule Commonly known as: EFFEXOR-XR TAKE 1 CAPSULE BY MOUTH ONCE DAILY **TAKE  WITH  150MG   CAPSULES  FOR  225MG   TOTAL  DAILY  DOSE What changed:  how much to take how to take this when to take this        Allergies  Allergen Reactions   Morphine    Sulfa Antibiotics Nausea And Vomiting and Other (See Comments)    "Made me sick"    Consultations:   Procedures:   Discharge Exam: BP (!) 142/86 (BP Location: Right Arm)   Pulse 93   Temp 97.8 F (36.6 C) (Oral)   Resp 16   Ht 5\' 8"  (1.727 m)   Wt 58.7 kg   SpO2 95%   BMI 19.68 kg/m  Physical Exam Constitutional:      Appearance: Normal appearance.  HENT:     Head: Normocephalic and atraumatic.     Mouth/Throat:     Mouth: Mucous membranes are moist.  Eyes:     Extraocular Movements: Extraocular movements intact.  Cardiovascular:     Rate and Rhythm: Normal rate and regular rhythm.  Pulmonary:     Effort: Pulmonary effort is normal. No respiratory distress.     Breath sounds: No wheezing.     Comments: Coarse breath sounds bilaterally Abdominal:     General: Bowel sounds are normal. There is no distension.     Palpations: Abdomen is soft.     Tenderness: There is no abdominal tenderness.  Musculoskeletal:        General:  Normal range of motion.     Cervical back: Normal range of motion and neck supple.  Skin:    General: Skin is warm and dry.  Neurological:     General: No focal deficit present.     Mental Status: She is alert.  Psychiatric:        Mood and Affect: Mood normal.      The results of significant diagnostics from this hospitalization (including imaging, microbiology, ancillary and laboratory) are listed below  for reference.   Microbiology: Recent Results (from the past 240 hour(s))  Resp panel by RT-PCR (RSV, Flu A&B, Covid) Anterior Nasal Swab     Status: None   Collection Time: 11/13/23  3:31 PM   Specimen: Anterior Nasal Swab  Result Value Ref Range Status   SARS Coronavirus 2 by RT PCR NEGATIVE NEGATIVE Final    Comment: (NOTE) SARS-CoV-2 target nucleic acids are NOT DETECTED.  The SARS-CoV-2 RNA is generally detectable in upper respiratory specimens during the acute phase of infection. The lowest concentration of SARS-CoV-2 viral copies this assay can detect is 138 copies/mL. A negative result does not preclude SARS-Cov-2 infection and should not be used as the sole basis for treatment or other patient management decisions. A negative result may occur with  improper specimen collection/handling, submission of specimen other than nasopharyngeal swab, presence of viral mutation(s) within the areas targeted by this assay, and inadequate number of viral copies(<138 copies/mL). A negative result must be combined with clinical observations, patient history, and epidemiological information. The expected result is Negative.  Fact Sheet for Patients:  BloggerCourse.com  Fact Sheet for Healthcare Providers:  SeriousBroker.it  This test is no t yet approved or cleared by the [place] FDA and  has been authorized for detection and/or diagnosis of SARS-CoV-2 by FDA under an Emergency Use Authorization (EUA). This EUA will remain   in effect (meaning this test can be used) for the duration of the COVID-19 declaration under Section 564(b)(1) of the Act, 21 U.S.C.section 360bbb-3(b)(1), unless the authorization is terminated  or revoked sooner.       Influenza A by PCR NEGATIVE NEGATIVE Final   Influenza B by PCR NEGATIVE NEGATIVE Final    Comment: (NOTE) The Xpert Xpress SARS-CoV-2/FLU/RSV plus assay is intended as an aid in the diagnosis of influenza from Nasopharyngeal swab specimens and should not be used as a sole basis for treatment. Nasal washings and aspirates are unacceptable for Xpert Xpress SARS-CoV-2/FLU/RSV testing.  Fact Sheet for Patients: BloggerCourse.com  Fact Sheet for Healthcare Providers: SeriousBroker.it  This test is not yet approved or cleared by the [place] FDA and has been authorized for detection and/or diagnosis of SARS-CoV-2 by FDA under an Emergency Use Authorization (EUA). This EUA will remain in effect (meaning this test can be used) for the duration of the COVID-19 declaration under Section 564(b)(1) of the Act, 21 U.S.C. section 360bbb-3(b)(1), unless the authorization is terminated or revoked.     Resp Syncytial Virus by PCR NEGATIVE NEGATIVE Final    Comment: (NOTE) Fact Sheet for Patients: BloggerCourse.com  Fact Sheet for Healthcare Providers: SeriousBroker.it  This test is not yet approved or cleared by the [place] FDA and has been authorized for detection and/or diagnosis of SARS-CoV-2 by FDA under an Emergency Use Authorization (EUA). This EUA will remain in effect (meaning this test can be used) for the duration of the COVID-19 declaration under Section 564(b)(1) of the Act, 21 U.S.C. section 360bbb-3(b)(1), unless the authorization is terminated or revoked.  Performed at [facility], [address] [place], [place] [address]    Respiratory (~20 pathogens) panel by PCR     Status: None   Collection Time: 11/14/23  5:51 PM   Specimen: Nasopharyngeal Swab; Respiratory  Result Value Ref Range Status   Adenovirus NOT DETECTED NOT DETECTED Final   Coronavirus 229E NOT DETECTED NOT DETECTED Final    Comment: (NOTE) The Coronavirus on the Respiratory Panel, DOES NOT test for the  novel  Coronavirus (2019 nCoV)    Coronavirus HKU1 NOT DETECTED NOT DETECTED Final   Coronavirus NL63 NOT DETECTED NOT DETECTED Final   Coronavirus OC43 NOT DETECTED NOT DETECTED Final   Metapneumovirus NOT DETECTED NOT DETECTED Final   Rhinovirus / Enterovirus NOT DETECTED NOT DETECTED Final   Influenza A NOT DETECTED NOT DETECTED Final   Influenza B NOT DETECTED NOT DETECTED Final   Parainfluenza Virus 1 NOT DETECTED NOT DETECTED Final   Parainfluenza Virus 2 NOT DETECTED NOT DETECTED Final   Parainfluenza Virus 3 NOT DETECTED NOT DETECTED Final   Parainfluenza Virus 4 NOT DETECTED NOT DETECTED Final   Respiratory Syncytial Virus NOT DETECTED NOT DETECTED Final   Bordetella pertussis NOT DETECTED NOT DETECTED Final   Bordetella Parapertussis NOT DETECTED NOT DETECTED Final   Chlamydophila pneumoniae NOT DETECTED NOT DETECTED Final   Mycoplasma pneumoniae NOT DETECTED NOT DETECTED Final    Comment: Performed at [facility], [address], [place], [place] [address]     Labs: BNP (last 3 results) Recent Labs    11/13/23 1531  BNP 66.4   Basic Metabolic Panel: Recent Labs  Lab 11/13/23 1531 11/13/23 1551 11/14/23 0542  NA 139 140 136  K 4.4 4.7 3.9  CL 105  --  104  CO2 25  --  22  GLUCOSE 86  --  139*  BUN 20  --  20  CREATININE 0.81  --  0.74  CALCIUM 8.8*  --  8.7*   Liver Function Tests: Recent Labs  Lab 11/13/23 1531  AST 19  ALT 13  ALKPHOS 94  BILITOT 0.6  PROT 7.3  ALBUMIN 3.7   No results for input(s): "LIPASE", "AMYLASE" in the last 168 hours. No results for input(s): "AMMONIA" in the last  168 hours. CBC: Recent Labs  Lab 11/13/23 1531 11/13/23 1551 11/14/23 0542  WBC 10.4  --  6.9  NEUTROABS 5.5  --   --   HGB 11.7* 13.9 11.7*  HCT 38.7 41.0 39.0  MCV 82.3  --  83.0  PLT 625*  --  570*   Cardiac Enzymes: No results for input(s): "CKTOTAL", "CKMB", "CKMBINDEX", "TROPONINI" in the last 168 hours. BNP: Invalid input(s): "POCBNP" CBG: No results for input(s): "GLUCAP" in the last 168 hours. D-Dimer No results for input(s): "DDIMER" in the last 72 hours. Hgb A1c No results for input(s): "HGBA1C" in the last 72 hours. Lipid Profile No results for input(s): "CHOL", "HDL", "LDLCALC", "TRIG", "CHOLHDL", "LDLDIRECT" in the last 72 hours. Thyroid function studies No results for input(s): "TSH", "T4TOTAL", "T3FREE", "THYROIDAB" in the last 72 hours.  Invalid input(s): "FREET3" Anemia work up No results for input(s): "VITAMINB12", "FOLATE", "FERRITIN", "TIBC", "IRON", "RETICCTPCT" in the last 72 hours. Urinalysis    Component Value Date/Time   COLORURINE STRAW (A) 09/15/2022 1451   APPEARANCEUR CLEAR 09/15/2022 1451   LABSPEC 1.011 09/15/2022 1451   PHURINE 6.0 09/15/2022 1451   GLUCOSEU NEGATIVE 09/15/2022 1451   HGBUR SMALL (A) 09/15/2022 1451   BILIRUBINUR NEGATIVE 09/15/2022 1451   BILIRUBINUR Negative 08/29/2019 1127   KETONESUR NEGATIVE 09/15/2022 1451   PROTEINUR NEGATIVE 09/15/2022 1451   UROBILINOGEN 0.2 08/29/2019 1127   NITRITE NEGATIVE 09/15/2022 1451   LEUKOCYTESUR SMALL (A) 09/15/2022 1451   Sepsis Labs Recent Labs  Lab 11/13/23 1531 11/14/23 0542  WBC 10.4 6.9   Microbiology Recent Results (from the past 240 hour(s))  Resp panel by RT-PCR (RSV, Flu A&B, Covid) Anterior Nasal Swab  Status: None   Collection Time: 11/13/23  3:31 PM   Specimen: Anterior Nasal Swab  Result Value Ref Range Status   SARS Coronavirus 2 by RT PCR NEGATIVE NEGATIVE Final    Comment: (NOTE) SARS-CoV-2 target nucleic acids are NOT DETECTED.  The SARS-CoV-2  RNA is generally detectable in upper respiratory specimens during the acute phase of infection. The lowest concentration of SARS-CoV-2 viral copies this assay can detect is 138 copies/mL. A negative result does not preclude SARS-Cov-2 infection and should not be used as the sole basis for treatment or other patient management decisions. A negative result may occur with  improper specimen collection/handling, submission of specimen other than nasopharyngeal swab, presence of viral mutation(s) within the areas targeted by this assay, and inadequate number of viral copies(<138 copies/mL). A negative result must be combined with clinical observations, patient history, and epidemiological information. The expected result is Negative.  Fact Sheet for Patients:  BloggerCourse.com  Fact Sheet for Healthcare Providers:  SeriousBroker.it  This test is no t yet approved or cleared by the [place] FDA and  has been authorized for detection and/or diagnosis of SARS-CoV-2 by FDA under an Emergency Use Authorization (EUA). This EUA will remain  in effect (meaning this test can be used) for the duration of the COVID-19 declaration under Section 564(b)(1) of the Act, 21 U.S.C.section 360bbb-3(b)(1), unless the authorization is terminated  or revoked sooner.       Influenza A by PCR NEGATIVE NEGATIVE Final   Influenza B by PCR NEGATIVE NEGATIVE Final    Comment: (NOTE) The Xpert Xpress SARS-CoV-2/FLU/RSV plus assay is intended as an aid in the diagnosis of influenza from Nasopharyngeal swab specimens and should not be used as a sole basis for treatment. Nasal washings and aspirates are unacceptable for Xpert Xpress SARS-CoV-2/FLU/RSV testing.  Fact Sheet for Patients: BloggerCourse.com  Fact Sheet for Healthcare Providers: SeriousBroker.it  This test is not yet approved or cleared by the  [place] FDA and has been authorized for detection and/or diagnosis of SARS-CoV-2 by FDA under an Emergency Use Authorization (EUA). This EUA will remain in effect (meaning this test can be used) for the duration of the COVID-19 declaration under Section 564(b)(1) of the Act, 21 U.S.C. section 360bbb-3(b)(1), unless the authorization is terminated or revoked.     Resp Syncytial Virus by PCR NEGATIVE NEGATIVE Final    Comment: (NOTE) Fact Sheet for Patients: BloggerCourse.com  Fact Sheet for Healthcare Providers: SeriousBroker.it  This test is not yet approved or cleared by the [place] FDA and has been authorized for detection and/or diagnosis of SARS-CoV-2 by FDA under an Emergency Use Authorization (EUA). This EUA will remain in effect (meaning this test can be used) for the duration of the COVID-19 declaration under Section 564(b)(1) of the Act, 21 U.S.C. section 360bbb-3(b)(1), unless the authorization is terminated or revoked.  Performed at [facility], [address] [place], [place] [address]   Respiratory (~20 pathogens) panel by PCR     Status: None   Collection Time: 11/14/23  5:51 PM   Specimen: Nasopharyngeal Swab; Respiratory  Result Value Ref Range Status   Adenovirus NOT DETECTED NOT DETECTED Final   Coronavirus 229E NOT DETECTED NOT DETECTED Final    Comment: (NOTE) The Coronavirus on the Respiratory Panel, DOES NOT test for the novel  Coronavirus (2019 nCoV)    Coronavirus HKU1 NOT DETECTED NOT DETECTED Final   Coronavirus NL63 NOT DETECTED NOT DETECTED Final   Coronavirus OC43  NOT DETECTED NOT DETECTED Final   Metapneumovirus NOT DETECTED NOT DETECTED Final   Rhinovirus / Enterovirus NOT DETECTED NOT DETECTED Final   Influenza A NOT DETECTED NOT DETECTED Final   Influenza B NOT DETECTED NOT DETECTED Final   Parainfluenza Virus 1 NOT DETECTED NOT DETECTED Final   Parainfluenza Virus  2 NOT DETECTED NOT DETECTED Final   Parainfluenza Virus 3 NOT DETECTED NOT DETECTED Final   Parainfluenza Virus 4 NOT DETECTED NOT DETECTED Final   Respiratory Syncytial Virus NOT DETECTED NOT DETECTED Final   Bordetella pertussis NOT DETECTED NOT DETECTED Final   Bordetella Parapertussis NOT DETECTED NOT DETECTED Final   Chlamydophila pneumoniae NOT DETECTED NOT DETECTED Final   Mycoplasma pneumoniae NOT DETECTED NOT DETECTED Final    Comment: Performed at [facility], [address], [place], [place] [address]    Procedures/Studies: CT Angio Chest PE W and/or Wo Contrast  Result Date: 11/13/2023 CLINICAL DATA:  Short of breath for 2 weeks, hypoxia EXAM: CT ANGIOGRAPHY CHEST WITH CONTRAST TECHNIQUE: Multidetector CT imaging of the chest was performed using the standard protocol during bolus administration of intravenous contrast. Multiplanar CT image reconstructions and MIPs were obtained to evaluate the vascular anatomy. RADIATION DOSE REDUCTION: This exam was performed according to the departmental dose-optimization program which includes automated exposure control, adjustment of the mA and/or kV according to patient size and/or use of iterative reconstruction technique. CONTRAST:  75mL OMNIPAQUE IOHEXOL 350 MG/ML SOLN COMPARISON:  11/13/2023, 08/11/2023 FINDINGS: Cardiovascular: This is a technically adequate evaluation of the pulmonary vasculature. No filling defects or pulmonary emboli. The heart is unremarkable without pericardial effusion. Prominent atherosclerosis throughout the coronary vasculature, greatest in the LAD distribution. Normal caliber of the thoracic aorta. Atherosclerosis of the aortic arch and descending thoracic aorta. Mediastinum/Nodes: No enlarged mediastinal, hilar, or axillary lymph nodes. Thyroid gland, trachea, and esophagus demonstrate no significant findings. Lungs/Pleura: Stable upper lobe predominant emphysema. No acute airspace disease, effusion, or  pneumothorax. Chronic areas of scarring throughout the left lower lobe. The 4 mm subpleural right middle lobe pulmonary nodule seen on recent lung cancer screening CT has resolved in the interim. No new pulmonary nodules. Upper Abdomen: No acute abnormality. Musculoskeletal: No acute or destructive bony abnormalities. Reconstructed images demonstrate no additional findings. Review of the MIP images confirms the above findings. IMPRESSION: 1. No evidence of pulmonary embolus. 2.  Emphysema (ICD10-J43.9).  No acute airspace disease. 3. Coronary artery atherosclerosis. Electronically Signed   By: Sharlet Salina M.D.   On: 11/13/2023 18:23   DG Chest Portable 1 View  Result Date: 11/13/2023 CLINICAL DATA:  Shortness of breath for the past 2 weeks. Hypoxia. Ex-smoker. EXAM: PORTABLE CHEST 1 VIEW COMPARISON:  06/29/2023 FINDINGS: Normal sized heart. Stable minimal linear scarring in the left lower lung zone. Otherwise, clear lungs with normal vascularity. No airspace consolidation or pleural fluid. Unremarkable bones. IMPRESSION: No acute abnormality. Electronically Signed   By: Beckie Salts M.D.   On: 11/13/2023 16:42     Time coordinating discharge: Over 30 minutes    Sherry Chamber, MD  [facility] 11/15/2023, 3:28 PM

## 2023-11-15 NOTE — Plan of Care (Signed)

## 2023-11-16 ENCOUNTER — Telehealth: Payer: Medicare Other | Admitting: Family Medicine

## 2023-11-17 ENCOUNTER — Other Ambulatory Visit (INDEPENDENT_AMBULATORY_CARE_PROVIDER_SITE_OTHER): Payer: Medicare Other

## 2023-11-17 ENCOUNTER — Encounter: Payer: Self-pay | Admitting: Nurse Practitioner

## 2023-11-17 ENCOUNTER — Ambulatory Visit: Payer: Medicare Other | Admitting: Nurse Practitioner

## 2023-11-17 VITALS — BP 134/86 | HR 94 | Ht 68.0 in | Wt 132.8 lb

## 2023-11-17 DIAGNOSIS — D649 Anemia, unspecified: Secondary | ICD-10-CM

## 2023-11-17 DIAGNOSIS — Z860100 Personal history of colon polyps, unspecified: Secondary | ICD-10-CM | POA: Diagnosis not present

## 2023-11-17 LAB — FOLATE: Folate: 16.6 ng/mL (ref >5.9)

## 2023-11-17 LAB — VITAMIN B12: Vitamin B-12: >1537 pg/mL — ABNORMAL HIGH (ref 211–911)

## 2023-11-17 LAB — FERRITIN: Ferritin: 17.4 ng/mL (ref 10.0–291.0)

## 2023-11-17 MED ORDER — NA SULFATE-K SULFATE-MG SULF 17.5-3.13-1.6 GM/177ML PO SOLN: ORAL | 0 refills | Status: DC

## 2023-11-17 NOTE — Progress Notes (Signed)
Brief Narrative 67 y.o. yo female, new to the practice with a past medical history not limited to migraines, Raynolds, emphysema, osteoporosis, splenectomy, iron deficiency anemia.  Referred by PCP for colon cancer screening  ASSESSMENT    Colon cancer screening.  Reports removal of polyps ~ 8 years ago, nature of polyps is unknown.   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 History of iron deficiency several years back ( cause unknown but sounds like she did have endoscopic evaluation done for anemia in 2015)   No IV Fe in years. Now with recent 1 gram decline in hgb. No overt GI bleeding but if iron deficient  will need to consider occult GI bleed. Celiac disease?   COPD.  Admitted from 12/6 to 12/8 with COPD exacerbation and hypoxia. Responded well to steroids and breathing treatments. Not on 02 at home.     Cerebellar ataxia.  Unsteady gait but can ambulate  Chronic thrombosis with history of splenectomy  See PMH for any additional medical history   PLAN    --Can take 1-2 stool softeners at bedtime if needed.  --Obtain b12, folate and iron studies keeping in mind she has been on oral iron and B12 for a month or so.  --Schedule for a EGD and colonoscopy on 12/28/23.  The risks and benefits of EGD and colonoscopy with possible polypectomy / biopsies were discussed and the patient agrees to proceed.  --She doesn't ever recall being tested for celiac disease. Unknown if ever had EGD with duodenal biopsies. 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  At that time her hgb was low. Sounds like she had a colonoscopy and maybe an EGD. She thinks colon polyps were removed but there was no Gi follow up. Saw Hematology in 2019 for IDA. Treated with IV iron.  Hgb improved and no recurrent anemia until just recently.  Labs late October 2024 by PCP showed a drop in hgb from mid 12 to 11.1. Started on oral iron and B12.   Interval History:  She has no Gi complaints such as nausea or abdominal pain. Sometimes stools are hard on oral iron but has BM 4 x / week. She hasn't had any overt GI bleeding. Doesn't donate blood. She does take an NSAID about one time a month.   No FMH of colon cancer.    Labs      Latest Ref Rng & Units 11/14/2023    5:42 AM 11/13/2023    3:51 PM 11/13/2023    3:31 PM  CBC  WBC 4.0 - 10.5 K/uL 6.9   10.4   Hemoglobin 12.0 - 15.0 g/dL 16.1  09.6  04.5   Hematocrit 36.0 - 46.0 % 39.0  41.0  38.7   Platelets 150 - 400 K/uL 570   625     Lab Results  Component Value Date   LIPASE 14.0 09/18/2022      Latest Ref Rng & Units 11/14/2023    5:42 AM 11/13/2023    3:51 PM 11/13/2023    3:31 PM  CMP  Glucose 70 - 99 mg/dL 409   86   BUN 8 - 23 mg/dL 20   20   Creatinine 8.11 - 1.00 mg/dL 9.14   7.82   Sodium 956 - 145 mmol/L 136  140  139   Potassium 3.5 - 5.1 mmol/L 3.9  4.7  4.4   Chloride 98 - 111 mmol/L 104   105   CO2 22 - 32 mmol/L 22   25   Calcium 8.9 - 10.3 mg/dL 8.7   8.8   Total Protein 6.5 - 8.1 g/dL   7.3   Total Bilirubin <1.2 mg/dL   0.6   Alkaline Phos 38 - 126 U/L   94   AST 15 - 41 U/L   19   ALT 0 - 44 U/L   13       CT Angio Chest PE W and/or Wo Contrast CLINICAL DATA:  Short of breath for 2 weeks, hypoxia  EXAM: CT ANGIOGRAPHY CHEST WITH CONTRAST  TECHNIQUE: Multidetector CT imaging of the chest was performed using the standard protocol during bolus administration of intravenous contrast. Multiplanar CT image reconstructions and MIPs were obtained to evaluate the vascular anatomy.  RADIATION DOSE REDUCTION: This exam was performed according to the departmental dose-optimization program which includes automated exposure control, adjustment of the mA and/or kV according to patient size and/or use of iterative reconstruction  technique.  CONTRAST:  75mL OMNIPAQUE IOHEXOL 350 MG/ML SOLN  COMPARISON:  11/13/2023, 08/11/2023  FINDINGS: Cardiovascular: This is a technically adequate evaluation of the pulmonary vasculature. No filling defects or pulmonary emboli.  The heart is unremarkable without pericardial effusion. Prominent atherosclerosis throughout the coronary vasculature, greatest in the LAD distribution. Normal caliber of the thoracic aorta. Atherosclerosis of the aortic arch and descending thoracic aorta.  Mediastinum/Nodes: No enlarged mediastinal, hilar, or axillary lymph nodes. Thyroid gland, trachea, and esophagus demonstrate no significant findings.  Lungs/Pleura: Stable upper lobe predominant emphysema. No acute airspace disease, effusion, or pneumothorax. Chronic areas of scarring throughout the left lower lobe. The 4 mm subpleural right middle lobe pulmonary nodule seen on recent lung cancer screening CT has resolved in the interim. No new pulmonary nodules.  Upper Abdomen: No acute abnormality.  Musculoskeletal: No acute or destructive bony abnormalities. Reconstructed images demonstrate no additional findings.  Review of the MIP images confirms the above findings.  IMPRESSION: 1. No evidence of pulmonary embolus. 2.  Emphysema (ICD10-J43.9).  No acute airspace disease. 3. Coronary artery atherosclerosis.  Electronically Signed   By: Sharlet Salina M.D.   On: 11/13/2023 18:23 DG Chest Portable 1 View CLINICAL DATA:  Shortness of breath for the past 2 weeks. Hypoxia. Ex-smoker.  EXAM: PORTABLE CHEST 1 VIEW  COMPARISON:  06/29/2023  FINDINGS: Normal sized heart. Stable minimal linear scarring in the left lower lung zone. Otherwise, clear lungs with normal vascularity. No airspace consolidation or pleural fluid. Unremarkable bones.  IMPRESSION: No acute abnormality.  Electronically Signed   By: Beckie Salts M.D.   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  Past Surgical History:  Procedure Laterality Date   ABDOMINAL HYSTERECTOMY     CATARACT EXTRACTION Bilateral 01/17/2020   SPLENECTOMY, TOTAL N/A 10/26/2014   Procedure: SPLENECTOMY;  Surgeon: Violeta Gelinas, MD;  Location: [facility] OR;  Service: General;  Laterality: N/A;   WRIST FRACTURE SURGERY Right    Family History  Problem Relation Age of Onset   Ataxia Mother    Stroke Mother    Heart disease Mother    Diabetes Mother    Cancer Father    Social History   Tobacco Use   Smoking status: Former    Average packs/day: 1 pack/day for 50.0 years (50.0 ttl pk-yrs)    Types: Cigarettes    Start date: 07/2021    Quit date: 12/08/1978    Years since quitting: 44.9   Smokeless tobacco: Never   Tobacco comments:    Quit on easter of 2022  Vaping Use   Vaping status: Never Used  Substance Use Topics   Alcohol use: Not Currently    Comment: socially   Drug use: No   Current Outpatient Medications  Medication Sig Dispense Refill   albuterol (VENTOLIN HFA) 108 (90 Base) MCG/ACT inhaler Inhale 1-2 puffs into the lungs every 4 (four) hours as needed for wheezing or shortness of breath. 9 g 2   alendronate (FOSAMAX) 70 MG tablet Take 1 tablet (70 mg total) by mouth every 7 (seven) days. Take with a full glass of water on an empty stomach. 4 tablet 11   atorvastatin  (LIPITOR) 10 MG tablet Take 1 tablet (10 mg total) by mouth at bedtime. 90 tablet 3   calcium-vitamin D (OSCAL WITH D) 500-5 MG-MCG tablet Take 1 tablet by mouth daily with breakfast.     cyanocobalamin (VITAMIN B12) 500 MCG tablet Take 500 mcg by mouth daily.     ferrous sulfate 325 (65 FE) MG EC tablet Take 325 mg by mouth daily.     Glycopyrrolate-Formoterol (BEVESPI AEROSPHERE) 9-4.8 MCG/ACT AERO Inhale 2 puffs into the lungs 2 (two) times daily. 11 g 11   oxybutynin (DITROPAN) 5 MG tablet Take 1 tablet (5 mg total) by mouth 2 (two) times daily. 180 tablet 3   valACYclovir (VALTREX) 1000 MG tablet Take 2 tablets (2,000 mg total) by mouth 2 (two) times daily. Once, as needed for cold sores 20 tablet 2   venlafaxine XR (EFFEXOR-XR) 150 MG 24 hr capsule TAKE 1 CAPSULE BY MOUTH AT BEDTIME **TAKE  WITH  75MG   CAPSULES  FOR  225MG   TOTAL  DAILY  DOSE (Patient taking differently: Take 150 mg by mouth at bedtime.) 90 capsule 3   venlafaxine XR (EFFEXOR-XR) 75 MG 24 hr capsule TAKE 1 CAPSULE BY MOUTH ONCE DAILY **TAKE  WITH  150MG   CAPSULES  FOR  225MG   TOTAL  DAILY  DOSE (Patient taking differently: Take 75 mg by mouth at bedtime. TAKE 1 CAPSULE BY MOUTH ONCE DAILY **TAKE  WITH  150MG   CAPSULES  FOR  225MG   TOTAL  DAILY  DOSE) 90 capsule 3   No current facility-administered medications for this visit.   Allergies  Allergen Reactions   Morphine    Sulfa Antibiotics Nausea And Vomiting and Other (See Comments)    "Made me sick"     Review of Systems: All systems reviewed and negative except where noted in HPI.   Wt Readings from Last 3 Encounters:  11/13/23 129 lb 6.6 oz (58.7 kg)  11/13/23 134 lb 8 oz (61 kg)  10/07/23  129 lb 3.2 oz (58.6 kg)    Physical Exam:  BP 134/86   Pulse 94   Ht 5\' 8"  (1.727 m)   Wt 132 lb 12.8 oz (60.2 kg)   BMI 20.19 kg/m  Constitutional:  Pleasant, generally well appearing female in no acute distress. Psychiatric:  Normal mood and affect. Behavior is  normal. EENT: Pupils normal.  Conjunctivae are normal. No scleral icterus. Neck supple.  Cardiovascular: Normal rate, regular rhythm.  Pulmonary/chest: Effort normal and breath sounds normal. No wheezing, rales or rhonchi. Abdominal: Soft, nondistended, nontender. Bowel sounds active throughout. There are no masses palpable. No hepatomegaly. Neurological: Alert and oriented to person place and time.   Willette Cluster, NP  11/17/2023, 1:25 PM  Cc:  Referring Provider Ardith Dark, MD

## 2023-11-17 NOTE — Patient Instructions (Addendum)
Your provider has requested that you go to the basement level for lab work before leaving today. Press "B" on the elevator. The lab is located at the first door on the left as you exit the elevator.   You have been scheduled for an endoscopy and colonoscopy. Please follow the written instructions given to you at your visit today.  Please pick up your prep supplies at the pharmacy within the next 1-3 days.  If you use inhalers (even only as needed), please bring them with you on the day of your procedure.  DO NOT TAKE 7 DAYS PRIOR TO TEST- Trulicity (dulaglutide) Ozempic, Wegovy (semaglutide) Mounjaro (tirzepatide) Bydureon Bcise (exanatide extended release)  DO NOT TAKE 1 DAY PRIOR TO YOUR TEST Rybelsus (semaglutide) Adlyxin (lixisenatide) Victoza (liraglutide) Byetta (exanatide)  We have sent the following medications to your pharmacy for you to pick up at your convenience: Suprep  Can take 1-2 stool softeners at bedtime as needed   If your blood pressure at your visit was 140/90 or greater, please contact your primary care physician to follow up on this.  _______________________________________________________  If you are age 68 or older, your body mass index should be between 23-30. Your Body mass index is 20.19 kg/m. If this is out of the aforementioned range listed, please consider follow up with your Primary Care Provider.  If you are age 69 or younger, your body mass index should be between 19-25. Your Body mass index is 20.19 kg/m. If this is out of the aformentioned range listed, please consider follow up with your Primary Care Provider.   ________________________________________________________  The [facility] providers would like to encourage you to use [facility] to communicate with providers for non-urgent requests or questions.  Due to long hold times on the telephone, sending your provider a message by [facility] may be a faster and more efficient way to get a response.   Please allow 48 business hours for a response.  Please remember that this is for non-urgent requests.  _______________________________________________________  Due to recent changes in healthcare laws, you may see the results of your imaging and laboratory studies on MyChart before your provider has had a chance to review them.  We understand that in some cases there may be results that are confusing or concerning to you. Not all laboratory results come back in the same time frame and the provider may be waiting for multiple results in order to interpret others.  Please give [place] 48 hours in order for your provider to thoroughly review all the results before contacting the office for clarification of your results.   Thank you for entrusting me with your care and choosing [facility].  Willette Cluster NP

## 2023-11-18 LAB — TISSUE TRANSGLUTAMINASE, IGG: (tTG) Ab, IgG: <1 U/mL

## 2023-11-18 LAB — IRON AND TIBC
Iron Saturation: 27 % (ref 15–55)
Iron: 121 ug/dL (ref 27–139)
Total Iron Binding Capacity: 448 ug/dL (ref 250–450)
UIBC: 327 ug/dL (ref 118–369)

## 2023-11-18 LAB — IGA: Immunoglobulin A: 244 mg/dL (ref 70–320)

## 2023-11-24 NOTE — Progress Notes (Signed)
Agree with the assessment and plan as outlined by Paula Guenther, NP.  Scott E. Cunningham, MD [facility]  

## 2023-12-03 ENCOUNTER — Telehealth: Payer: Self-pay | Admitting: *Deleted

## 2023-12-03 ENCOUNTER — Other Ambulatory Visit: Payer: Self-pay | Admitting: *Deleted

## 2023-12-03 DIAGNOSIS — D649 Anemia, unspecified: Secondary | ICD-10-CM

## 2023-12-03 NOTE — Telephone Encounter (Signed)
Patient information faxed to Dr. Gustavo Lah office to fax number [phone] for Hemotology referral via Ms. Willette Cluster, NP. Unable to contact patient, left information on VM. Referral due to recurrent iron defieceincy anemia.

## 2023-12-03 NOTE — Telephone Encounter (Signed)
-----   Message from Willette Cluster sent at 12/01/2023  9:36 AM EST ----- Alona Bene,  Please let her know that she  is iron deficient again. Asked her to continue the oral iron (except  for 5 days prior to the EGD / colonoscopy scheduled in January) . Please refer her back to Dr. Myna Hidalgo for recurrent iron deficiency anemia. I think she last saw him in 2019. Thanks

## 2023-12-13 ENCOUNTER — Inpatient Hospital Stay (HOSPITAL_COMMUNITY): Payer: Medicare Other

## 2023-12-13 ENCOUNTER — Other Ambulatory Visit: Payer: Self-pay

## 2023-12-13 ENCOUNTER — Telehealth: Payer: Self-pay | Admitting: Internal Medicine

## 2023-12-13 ENCOUNTER — Emergency Department (HOSPITAL_COMMUNITY): Payer: Medicare Other

## 2023-12-13 ENCOUNTER — Encounter (HOSPITAL_COMMUNITY): Payer: Self-pay | Admitting: Emergency Medicine

## 2023-12-13 ENCOUNTER — Inpatient Hospital Stay (HOSPITAL_COMMUNITY)
Admission: EM | Admit: 2023-12-13 | Discharge: 2023-12-17 | DRG: 321 | Disposition: A | Discharge status: Home / Self Care | Payer: Medicare Other | Attending: Internal Medicine | Admitting: Internal Medicine

## 2023-12-13 DIAGNOSIS — F419 Anxiety disorder, unspecified: Secondary | ICD-10-CM

## 2023-12-13 DIAGNOSIS — I2510 Atherosclerotic heart disease of native coronary artery without angina pectoris: Secondary | ICD-10-CM

## 2023-12-13 DIAGNOSIS — R9431 Abnormal electrocardiogram [ECG] [EKG]: Secondary | ICD-10-CM

## 2023-12-13 DIAGNOSIS — F32A Depression, unspecified: Secondary | ICD-10-CM

## 2023-12-13 DIAGNOSIS — J441 Chronic obstructive pulmonary disease with (acute) exacerbation: Secondary | ICD-10-CM | POA: Diagnosis present

## 2023-12-13 DIAGNOSIS — F411 Generalized anxiety disorder: Secondary | ICD-10-CM | POA: Diagnosis present

## 2023-12-13 DIAGNOSIS — J90 Pleural effusion, not elsewhere classified: Secondary | ICD-10-CM | POA: Diagnosis not present

## 2023-12-13 DIAGNOSIS — Z7902 Long term (current) use of antithrombotics/antiplatelets: Secondary | ICD-10-CM

## 2023-12-13 DIAGNOSIS — Z833 Family history of diabetes mellitus: Secondary | ICD-10-CM

## 2023-12-13 DIAGNOSIS — J449 Chronic obstructive pulmonary disease, unspecified: Secondary | ICD-10-CM | POA: Diagnosis not present

## 2023-12-13 DIAGNOSIS — J432 Centrilobular emphysema: Secondary | ICD-10-CM | POA: Diagnosis present

## 2023-12-13 DIAGNOSIS — Z7983 Long term (current) use of bisphosphonates: Secondary | ICD-10-CM

## 2023-12-13 DIAGNOSIS — R0603 Acute respiratory distress: Secondary | ICD-10-CM | POA: Diagnosis not present

## 2023-12-13 DIAGNOSIS — J439 Emphysema, unspecified: Secondary | ICD-10-CM | POA: Diagnosis not present

## 2023-12-13 DIAGNOSIS — I499 Cardiac arrhythmia, unspecified: Secondary | ICD-10-CM | POA: Diagnosis not present

## 2023-12-13 DIAGNOSIS — Z79899 Other long term (current) drug therapy: Secondary | ICD-10-CM | POA: Diagnosis not present

## 2023-12-13 DIAGNOSIS — Z885 Allergy status to narcotic agent status: Secondary | ICD-10-CM

## 2023-12-13 DIAGNOSIS — I700 Atherosclerosis of aorta: Secondary | ICD-10-CM

## 2023-12-13 DIAGNOSIS — T380X5A Adverse effect of glucocorticoids and synthetic analogues, initial encounter: Secondary | ICD-10-CM | POA: Diagnosis not present

## 2023-12-13 DIAGNOSIS — E538 Deficiency of other specified B group vitamins: Secondary | ICD-10-CM | POA: Diagnosis not present

## 2023-12-13 DIAGNOSIS — G119 Hereditary ataxia, unspecified: Secondary | ICD-10-CM | POA: Diagnosis present

## 2023-12-13 DIAGNOSIS — Z1152 Encounter for screening for COVID-19: Secondary | ICD-10-CM

## 2023-12-13 DIAGNOSIS — R0609 Other forms of dyspnea: Secondary | ICD-10-CM | POA: Diagnosis not present

## 2023-12-13 DIAGNOSIS — Z90710 Acquired absence of both cervix and uterus: Secondary | ICD-10-CM

## 2023-12-13 DIAGNOSIS — D72829 Elevated white blood cell count, unspecified: Secondary | ICD-10-CM | POA: Diagnosis not present

## 2023-12-13 DIAGNOSIS — R0689 Other abnormalities of breathing: Secondary | ICD-10-CM | POA: Diagnosis not present

## 2023-12-13 DIAGNOSIS — Z7982 Long term (current) use of aspirin: Secondary | ICD-10-CM

## 2023-12-13 DIAGNOSIS — I252 Old myocardial infarction: Secondary | ICD-10-CM | POA: Diagnosis present

## 2023-12-13 DIAGNOSIS — Z8249 Family history of ischemic heart disease and other diseases of the circulatory system: Secondary | ICD-10-CM

## 2023-12-13 DIAGNOSIS — M810 Age-related osteoporosis without current pathological fracture: Secondary | ICD-10-CM | POA: Diagnosis not present

## 2023-12-13 DIAGNOSIS — I2584 Coronary atherosclerosis due to calcified coronary lesion: Secondary | ICD-10-CM

## 2023-12-13 DIAGNOSIS — I214 Non-ST elevation (NSTEMI) myocardial infarction: Secondary | ICD-10-CM

## 2023-12-13 DIAGNOSIS — R0602 Shortness of breath: Secondary | ICD-10-CM | POA: Diagnosis not present

## 2023-12-13 DIAGNOSIS — Z87891 Personal history of nicotine dependence: Secondary | ICD-10-CM

## 2023-12-13 DIAGNOSIS — R0989 Other specified symptoms and signs involving the circulatory and respiratory systems: Secondary | ICD-10-CM | POA: Diagnosis not present

## 2023-12-13 DIAGNOSIS — Z9081 Acquired absence of spleen: Secondary | ICD-10-CM

## 2023-12-13 DIAGNOSIS — E611 Iron deficiency: Secondary | ICD-10-CM | POA: Diagnosis not present

## 2023-12-13 DIAGNOSIS — G3281 Cerebellar ataxia in diseases classified elsewhere: Secondary | ICD-10-CM

## 2023-12-13 DIAGNOSIS — E7841 Elevated Lipoprotein(a): Secondary | ICD-10-CM | POA: Diagnosis not present

## 2023-12-13 DIAGNOSIS — R000 Tachycardia, unspecified: Secondary | ICD-10-CM | POA: Diagnosis not present

## 2023-12-13 DIAGNOSIS — J9601 Acute respiratory failure with hypoxia: Secondary | ICD-10-CM | POA: Diagnosis present

## 2023-12-13 DIAGNOSIS — Z882 Allergy status to sulfonamides status: Secondary | ICD-10-CM

## 2023-12-13 DIAGNOSIS — N3281 Overactive bladder: Secondary | ICD-10-CM | POA: Diagnosis present

## 2023-12-13 DIAGNOSIS — I7300 Raynaud's syndrome without gangrene: Secondary | ICD-10-CM | POA: Diagnosis present

## 2023-12-13 DIAGNOSIS — Z823 Family history of stroke: Secondary | ICD-10-CM

## 2023-12-13 DIAGNOSIS — I5031 Acute diastolic (congestive) heart failure: Secondary | ICD-10-CM | POA: Diagnosis not present

## 2023-12-13 DIAGNOSIS — R0600 Dyspnea, unspecified: Secondary | ICD-10-CM | POA: Diagnosis not present

## 2023-12-13 DIAGNOSIS — R918 Other nonspecific abnormal finding of lung field: Secondary | ICD-10-CM | POA: Diagnosis not present

## 2023-12-13 DIAGNOSIS — Z955 Presence of coronary angioplasty implant and graft: Secondary | ICD-10-CM | POA: Diagnosis not present

## 2023-12-13 DIAGNOSIS — F3289 Other specified depressive episodes: Secondary | ICD-10-CM

## 2023-12-13 LAB — CBC WITH DIFFERENTIAL/PLATELET
Abs Immature Granulocytes: 0.09 K/uL — ABNORMAL HIGH (ref 0.00–0.07)
Basophils Absolute: 0.1 K/uL (ref 0.0–0.1)
Basophils Relative: 1 %
Eosinophils Absolute: 0.2 K/uL (ref 0.0–0.5)
Eosinophils Relative: 1 %
HCT: 39.6 % (ref 36.0–46.0)
Hemoglobin: 11.9 g/dL — ABNORMAL LOW (ref 12.0–15.0)
Immature Granulocytes: 1 %
Lymphocytes Relative: 11 %
Lymphs Abs: 1.6 K/uL (ref 0.7–4.0)
MCH: 25.3 pg — ABNORMAL LOW (ref 26.0–34.0)
MCHC: 30.1 g/dL (ref 30.0–36.0)
MCV: 84.3 fL (ref 80.0–100.0)
Monocytes Absolute: 1 K/uL (ref 0.1–1.0)
Monocytes Relative: 7 %
Neutro Abs: 10.9 K/uL — ABNORMAL HIGH (ref 1.7–7.7)
Neutrophils Relative %: 79 %
Platelets: 469 K/uL — ABNORMAL HIGH (ref 150–400)
RBC: 4.7 MIL/uL (ref 3.87–5.11)
RDW: 18 % — ABNORMAL HIGH (ref 11.5–15.5)
WBC: 13.8 K/uL — ABNORMAL HIGH (ref 4.0–10.5)
nRBC: 0 % (ref 0.0–0.2)

## 2023-12-13 LAB — I-STAT VENOUS BLOOD GAS, ED
Acid-Base Excess: 0 mmol/L (ref 0.0–2.0)
Bicarbonate: 23.4 mmol/L (ref 20.0–28.0)
Calcium, Ion: 1 mmol/L — ABNORMAL LOW (ref 1.15–1.40)
HCT: 39 % (ref 36.0–46.0)
Hemoglobin: 13.3 g/dL (ref 12.0–15.0)
O2 Saturation: 95 %
Potassium: 3.5 mmol/L (ref 3.5–5.1)
Sodium: 137 mmol/L (ref 135–145)
TCO2: 24 mmol/L (ref 22–32)
pCO2, Ven: 32.4 mm[Hg] — ABNORMAL LOW (ref 44–60)
pH, Ven: 7.467 — ABNORMAL HIGH (ref 7.25–7.43)
pO2, Ven: 72 mm[Hg] — ABNORMAL HIGH (ref 32–45)

## 2023-12-13 LAB — BASIC METABOLIC PANEL
Anion gap: 13 (ref 5–15)
BUN: 10 mg/dL (ref 8–23)
CO2: 20 mmol/L — ABNORMAL LOW (ref 22–32)
Calcium: 8.8 mg/dL — ABNORMAL LOW (ref 8.9–10.3)
Chloride: 102 mmol/L (ref 98–111)
Creatinine, Ser: 0.77 mg/dL (ref 0.44–1.00)
GFR, Estimated: >60 mL/min (ref >60)
Glucose, Bld: 174 mg/dL — ABNORMAL HIGH (ref 70–99)
Potassium: 3.5 mmol/L (ref 3.5–5.1)
Sodium: 135 mmol/L (ref 135–145)

## 2023-12-13 LAB — HEPARIN LEVEL (UNFRACTIONATED)
Heparin Unfractionated: <0.1 [IU]/mL — ABNORMAL LOW (ref 0.30–0.70)
Heparin Unfractionated: 1.01 [IU]/mL — ABNORMAL HIGH (ref 0.30–0.70)
Heparin Unfractionated: 0.38 [IU]/mL (ref 0.30–0.70)
Heparin Unfractionated: 0.33 [IU]/mL (ref 0.30–0.70)

## 2023-12-13 LAB — CBC
HCT: 36.7 % (ref 36.0–46.0)
Hemoglobin: 11.4 g/dL — ABNORMAL LOW (ref 12.0–15.0)
MCH: 25.9 pg — ABNORMAL LOW (ref 26.0–34.0)
MCHC: 31.1 g/dL (ref 30.0–36.0)
MCV: 83.4 fL (ref 80.0–100.0)
Platelets: 449 10*3/uL — ABNORMAL HIGH (ref 150–400)
RBC: 4.4 MIL/uL (ref 3.87–5.11)
RDW: 17.9 % — ABNORMAL HIGH (ref 11.5–15.5)
WBC: 14.4 10*3/uL — ABNORMAL HIGH (ref 4.0–10.5)
nRBC: 0 % (ref 0.0–0.2)

## 2023-12-13 LAB — RESP PANEL BY RT-PCR (RSV, FLU A&B, COVID)  RVPGX2
Influenza A by PCR: NEGATIVE
Influenza B by PCR: NEGATIVE
Resp Syncytial Virus by PCR: NEGATIVE
SARS Coronavirus 2 by RT PCR: NEGATIVE

## 2023-12-13 LAB — ECHOCARDIOGRAM COMPLETE
AR max vel: 3.01 cm2
AV Area VTI: 2.75 cm2
AV Area mean vel: 2.87 cm2
AV Mean grad: 3 mm[Hg]
AV Peak grad: 5.7 mm[Hg]
Ao pk vel: 1.19 m/s
Area-P 1/2: 3.65 cm2
Height: 67 in
S' Lateral: 4.1 cm
Weight: 2160 [oz_av]

## 2023-12-13 LAB — TROPONIN I (HIGH SENSITIVITY)
Troponin I (High Sensitivity): 465 ng/L
Troponin I (High Sensitivity): 1005 ng/L (ref <18)

## 2023-12-13 LAB — BRAIN NATRIURETIC PEPTIDE: B Natriuretic Peptide: 88 pg/mL (ref 0.0–100.0)

## 2023-12-13 LAB — MAGNESIUM: Magnesium: 1.9 mg/dL (ref 1.7–2.4)

## 2023-12-13 MED ORDER — SODIUM CHLORIDE 0.9 % WEIGHT BASED INFUSION: 3.0000 mL/kg/h | INTRAVENOUS | Status: AC

## 2023-12-13 MED ORDER — ASPIRIN 300 MG RE SUPP
300.0000 mg | RECTAL | Status: AC
  Filled 2023-12-13: qty 1

## 2023-12-13 MED ORDER — ACETAMINOPHEN 325 MG PO TABS
650.0000 mg | ORAL_TABLET | ORAL | Status: DC | PRN
  Administered 2023-12-13 – 2023-12-17 (×4): 650 mg via ORAL
  Filled 2023-12-13 (×5): qty 2

## 2023-12-13 MED ORDER — BENZONATATE 100 MG PO CAPS
200.0000 mg | ORAL_CAPSULE | Freq: Three times a day (TID) | ORAL | Status: DC | PRN
  Administered 2023-12-13 – 2023-12-16 (×7): 200 mg via ORAL
  Filled 2023-12-13 (×8): qty 2

## 2023-12-13 MED ORDER — VENLAFAXINE HCL ER 150 MG PO CP24
150.0000 mg | ORAL_CAPSULE | Freq: Every day | ORAL | Status: DC
  Administered 2023-12-13 – 2023-12-16 (×4): 150 mg via ORAL
  Filled 2023-12-13: qty 2
  Filled 2023-12-13 (×2): qty 1
  Filled 2023-12-13: qty 2

## 2023-12-13 MED ORDER — POTASSIUM CHLORIDE CRYS ER 20 MEQ PO TBCR
40.0000 meq | EXTENDED_RELEASE_TABLET | Freq: Every day | ORAL | Status: DC
Start: 2023-12-13 — End: 2023-12-17
  Administered 2023-12-13 – 2023-12-17 (×5): 40 meq via ORAL
  Filled 2023-12-13 (×5): qty 2

## 2023-12-13 MED ORDER — UMECLIDINIUM BROMIDE 62.5 MCG/ACT IN AEPB
1.0000 | INHALATION_SPRAY | Freq: Every day | RESPIRATORY_TRACT | Status: DC
  Administered 2023-12-13 – 2023-12-14 (×2): 1 via RESPIRATORY_TRACT
  Filled 2023-12-13: qty 7

## 2023-12-13 MED ORDER — HEPARIN (PORCINE) 25000 UT/250ML-% IV SOLN
750.0000 [IU]/h | INTRAVENOUS | Status: DC
  Administered 2023-12-13: 700 [IU]/h via INTRAVENOUS
  Administered 2023-12-14: 750 [IU]/h via INTRAVENOUS
  Filled 2023-12-13 (×2): qty 250

## 2023-12-13 MED ORDER — ATORVASTATIN CALCIUM 80 MG PO TABS
80.0000 mg | ORAL_TABLET | Freq: Every day | ORAL | Status: DC
  Administered 2023-12-13 – 2023-12-17 (×5): 80 mg via ORAL
  Filled 2023-12-13 (×3): qty 1
  Filled 2023-12-13: qty 2
  Filled 2023-12-13: qty 1

## 2023-12-13 MED ORDER — NITROGLYCERIN 0.4 MG SL SUBL: 0.4000 mg | SUBLINGUAL_TABLET | SUBLINGUAL | Status: DC | PRN

## 2023-12-13 MED ORDER — IPRATROPIUM-ALBUTEROL 0.5-2.5 (3) MG/3ML IN SOLN
3.0000 mL | Freq: Once | RESPIRATORY_TRACT | Status: AC
  Administered 2023-12-13: 3 mL via RESPIRATORY_TRACT
  Filled 2023-12-13: qty 3

## 2023-12-13 MED ORDER — ARFORMOTEROL TARTRATE 15 MCG/2ML IN NEBU
15.0000 ug | INHALATION_SOLUTION | Freq: Two times a day (BID) | RESPIRATORY_TRACT | Status: DC
  Administered 2023-12-13 – 2023-12-17 (×9): 15 ug via RESPIRATORY_TRACT
  Filled 2023-12-13 (×9): qty 2

## 2023-12-13 MED ORDER — ONDANSETRON HCL 4 MG/2ML IJ SOLN
4.0000 mg | Freq: Once | INTRAMUSCULAR | Status: AC
  Administered 2023-12-13: 4 mg via INTRAVENOUS
  Filled 2023-12-13: qty 2

## 2023-12-13 MED ORDER — ALBUTEROL SULFATE (2.5 MG/3ML) 0.083% IN NEBU
2.5000 mg | INHALATION_SOLUTION | RESPIRATORY_TRACT | Status: DC | PRN
  Administered 2023-12-14 – 2023-12-15 (×3): 2.5 mg via RESPIRATORY_TRACT
  Filled 2023-12-13 (×2): qty 3

## 2023-12-13 MED ORDER — VENLAFAXINE HCL ER 75 MG PO CP24: 75.0000 mg | ORAL_CAPSULE | Freq: Every day | ORAL | Status: DC

## 2023-12-13 MED ORDER — OYSTER SHELL CALCIUM/D3 500-5 MG-MCG PO TABS
1.0000 | ORAL_TABLET | Freq: Every day | ORAL | Status: DC
  Administered 2023-12-13 – 2023-12-17 (×5): 1 via ORAL
  Filled 2023-12-13 (×5): qty 1

## 2023-12-13 MED ORDER — ASPIRIN 325 MG PO TABS: 325.0000 mg | ORAL_TABLET | Freq: Every day | ORAL | Status: DC

## 2023-12-13 MED ORDER — OXYBUTYNIN CHLORIDE 5 MG PO TABS
5.0000 mg | ORAL_TABLET | Freq: Two times a day (BID) | ORAL | Status: DC
  Administered 2023-12-13 – 2023-12-17 (×9): 5 mg via ORAL
  Filled 2023-12-13 (×9): qty 1

## 2023-12-13 MED ORDER — METOPROLOL TARTRATE 12.5 MG HALF TABLET
12.5000 mg | ORAL_TABLET | Freq: Two times a day (BID) | ORAL | Status: DC
  Administered 2023-12-13 – 2023-12-16 (×7): 12.5 mg via ORAL
  Filled 2023-12-13 (×7): qty 1

## 2023-12-13 MED ORDER — METHYLPREDNISOLONE SODIUM SUCC 125 MG IJ SOLR
125.0000 mg | INTRAMUSCULAR | Status: AC
  Administered 2023-12-13: 125 mg via INTRAVENOUS
  Filled 2023-12-13: qty 2

## 2023-12-13 MED ORDER — ASPIRIN 81 MG PO CHEW
324.0000 mg | CHEWABLE_TABLET | ORAL | Status: AC
  Administered 2023-12-13: 324 mg via ORAL
  Filled 2023-12-13: qty 4

## 2023-12-13 MED ORDER — HEPARIN BOLUS VIA INFUSION
3500.0000 [IU] | Freq: Once | INTRAVENOUS | Status: AC
  Administered 2023-12-13: 3500 [IU] via INTRAVENOUS
  Filled 2023-12-13: qty 3500

## 2023-12-13 MED ORDER — VITAMIN B-12 1000 MCG PO TABS
500.0000 ug | ORAL_TABLET | Freq: Every day | ORAL | Status: DC
  Administered 2023-12-13 – 2023-12-17 (×5): 500 ug via ORAL
  Filled 2023-12-13 (×5): qty 1

## 2023-12-13 MED ORDER — IOHEXOL 350 MG/ML SOLN
50.0000 mL | Freq: Once | INTRAVENOUS | Status: AC | PRN
  Administered 2023-12-13: 50 mL via INTRAVENOUS

## 2023-12-13 MED ORDER — ONDANSETRON HCL 4 MG/2ML IJ SOLN
4.0000 mg | Freq: Four times a day (QID) | INTRAMUSCULAR | Status: DC | PRN
  Administered 2023-12-13: 4 mg via INTRAVENOUS
  Filled 2023-12-13: qty 2

## 2023-12-13 MED ORDER — ASPIRIN 81 MG PO TBEC
81.0000 mg | DELAYED_RELEASE_TABLET | Freq: Every day | ORAL | Status: DC
  Administered 2023-12-16 – 2023-12-17 (×2): 81 mg via ORAL
  Filled 2023-12-13 (×4): qty 1

## 2023-12-13 MED ORDER — FERROUS SULFATE 325 (65 FE) MG PO TABS
325.0000 mg | ORAL_TABLET | Freq: Every day | ORAL | Status: DC
  Administered 2023-12-13 – 2023-12-17 (×5): 325 mg via ORAL
  Filled 2023-12-13 (×5): qty 1

## 2023-12-13 MED ORDER — SODIUM CHLORIDE 0.9 % WEIGHT BASED INFUSION
1.0000 mL/kg/h | INTRAVENOUS | Status: DC
  Administered 2023-12-14: 1 mL/kg/h via INTRAVENOUS

## 2023-12-13 MED ORDER — ASPIRIN 81 MG PO CHEW
81.0000 mg | CHEWABLE_TABLET | ORAL | Status: AC
  Administered 2023-12-14: 81 mg via ORAL
  Filled 2023-12-13: qty 1

## 2023-12-13 NOTE — ED Triage Notes (Signed)
 Pt with PMX emphysema presents to [facility] from home via EMS with c/o sudden SOB x1 hr. Pt RA sat 85%, placed on NRB, increased to 96%. EMS titrated pt to [facility]. Does not normally wear oxygen. Has f/u appointment with pulmonologist on Tuesday due to similar episodes. One episode of vomiting, EMS administered 4mg  Zofran  IVP. Denies dizziness, SOB.

## 2023-12-13 NOTE — Progress Notes (Signed)
 PHARMACY - ANTICOAGULATION CONSULT NOTE  Pharmacy Consult for IV heparin  Indication: chest pain/ACS  Allergies  Allergen Reactions   Morphine    Sulfa Antibiotics Nausea And Vomiting and Other (See Comments)    Made me sick    Patient Measurements: Height: 5' 7 (170.2 cm) Weight: 61.2 kg (135 lb) IBW/kg (Calculated) : 61.6 Heparin  Dosing Weight: 61.2 kg  Vital Signs: Temp: 99.8 F (37.7 C) (01/05 0408) Temp Source: Oral (01/05 0408) BP: 138/89 (01/05 0408) Pulse Rate: 113 (01/05 0408)  Labs: Recent Labs    12/13/23 0424 12/13/23 0434  HGB 11.9* 13.3  HCT 39.6 39.0  PLT 469*  --   CREATININE 0.77  --   TROPONINIHS 465*  --     Estimated Creatinine Clearance: 65 mL/min (by C-G formula based on SCr of 0.77 mg/dL).   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    Assessment: Sherry Zimmerman is a 68 y.o. year old female presented on 12/13/2023 with shob with new O2 requirement and now with concern for ACS. Troponin 465 in ED. No anticoagulation prior to admission. Pharmacy consulted to dose heparin .  Goal of Therapy:  Heparin  level 0.3-0.7 units/ml Monitor platelets by anticoagulation protocol: Yes   Plan:  Heparin  3500 units x 1 as bolus followed by heparin  infusion at 700 units/hr 6 heparin  level  Daily heparin  level, CBC, and monitoring  for bleeding F/u plans for anticoagulation and cards  Thank you for allowing pharmacy to participate in this patient's care.  Leonor GORMAN Bash, PharmD Emergency Medicine Clinical Pharmacist 12/13/2023,5:52 AM

## 2023-12-13 NOTE — Progress Notes (Signed)
Echocardiogram being done at this time.

## 2023-12-13 NOTE — ED Notes (Signed)
 Date and time results received: 12/13/23 0522 (use smartphrase ".now" to insert current time)  Test: trop  Critical Value: 425  Name of Provider Notified: gray  Orders Received? Or Actions Taken?:

## 2023-12-13 NOTE — Telephone Encounter (Signed)
 Patient hospitalized, can we cancel her PFT for Tuesday and call her to reschedule

## 2023-12-13 NOTE — ED Provider Notes (Signed)
 [facility] Provider Note   CSN: [id number] Arrival date & time: 12/13/23  0400     History  Chief Complaint  Patient presents with   Shortness of Breath   Emesis    Sherry Zimmerman is a 68 y.o. female with medical history of COPD, depression, cerebellar ataxia, anxiety, Raynaud's disease, migraines, iron deficiency anemia.  Patient presents to ED for evaluation of shortness of breath, nausea.  Patient reports that for the last 2 days she has been persistently short of breath, coughing.  She is also endorsing a sore throat which she believes is secondary to her cough.  Denies any leg swelling, lightheadedness, dizziness.  Is endorsing weakness.  Denies home oxygen use however arrives on 4 L via nasal cannula as she was found to be hypoxemic in the field with EMS.  She states that she has been lying in her bed for the last 2 days secondary to her weakness.  Denies any chest pain.  Denies abdominal pain, dysuria.  Denies currently smoking.   Shortness of Breath Associated symptoms: cough, sore throat and vomiting   Associated symptoms: no abdominal pain and no chest pain   Emesis Associated symptoms: cough and sore throat   Associated symptoms: no abdominal pain        Home Medications Prior to Admission medications   Medication Sig Start Date End Date Taking? Authorizing Provider  albuterol  (VENTOLIN  HFA) 108 (90 Base) MCG/ACT inhaler Inhale 1-2 puffs into the lungs every 4 (four) hours as needed for wheezing or shortness of breath. 07/02/23   Odell Celinda Balo, MD  alendronate  (FOSAMAX ) 70 MG tablet Take 1 tablet (70 mg total) by mouth every 7 (seven) days. Take with a full glass of water on an empty stomach. 10/07/23   Jodie Lavern CROME, MD  atorvastatin  (LIPITOR ) 10 MG tablet Take 1 tablet (10 mg total) by mouth at bedtime. 10/07/23   Jodie Lavern CROME, MD  calcium -vitamin D  (OSCAL WITH D) 500-5 MG-MCG tablet Take 1 tablet by mouth  daily with breakfast. 10/07/23   Jodie Lavern CROME, MD  cyanocobalamin  (VITAMIN B12) 500 MCG tablet Take 500 mcg by mouth daily.    [provider]  ferrous sulfate  325 (65 FE) MG EC tablet Take 325 mg by mouth daily.    [provider]  Glycopyrrolate -Formoterol  (BEVESPI  AEROSPHERE) 9-4.8 MCG/ACT AERO Inhale 2 puffs into the lungs 2 (two) times daily. 10/07/23   Jodie Lavern CROME, MD  Na Sulfate-K Sulfate-Mg Sulf 17.5-3.13-1.6 GM/177ML SOLN Use as directed; may use generic; goodrx card if insurance will not cover generic 11/17/23   Kerman Vina HERO, NP  oxybutynin  (DITROPAN ) 5 MG tablet Take 1 tablet (5 mg total) by mouth 2 (two) times daily. 10/07/23   Jodie Lavern CROME, MD  valACYclovir  (VALTREX ) 1000 MG tablet Take 2 tablets (2,000 mg total) by mouth 2 (two) times daily. Once, as needed for cold sores 10/07/23   Jodie Lavern CROME, MD  venlafaxine  XR (EFFEXOR -XR) 150 MG 24 hr capsule TAKE 1 CAPSULE BY MOUTH AT BEDTIME **TAKE  WITH  75MG   CAPSULES  FOR  225MG   TOTAL  DAILY  DOSE Patient taking differently: Take 150 mg by mouth at bedtime. 10/07/23   Jodie Lavern CROME, MD  venlafaxine  XR (EFFEXOR -XR) 75 MG 24 hr capsule TAKE 1 CAPSULE BY MOUTH ONCE DAILY **TAKE  WITH  150MG   CAPSULES  FOR  225MG   TOTAL  DAILY  DOSE Patient taking differently: Take 75  mg by mouth at bedtime. TAKE 1 CAPSULE BY MOUTH ONCE DAILY **TAKE  WITH  150MG   CAPSULES  FOR  225MG   TOTAL  DAILY  DOSE 10/07/23   Jodie Lavern CROME, MD      Allergies    Morphine and Sulfa antibiotics    Review of Systems   Review of Systems  HENT:  Positive for sore throat.   Respiratory:  Positive for cough and shortness of breath.   Cardiovascular:  Negative for chest pain and leg swelling.  Gastrointestinal:  Positive for nausea and vomiting. Negative for abdominal pain.  Genitourinary:  Negative for dysuria.  Neurological:  Positive for weakness. Negative for dizziness and light-headedness.  All other systems reviewed and are  negative.   Physical Exam Updated Vital Signs BP 138/89 (BP Location: Right Arm)   Pulse (!) 113   Temp 99.8 F (37.7 C) (Oral)   Resp 20   Ht 5' 7 (1.702 m)   Wt 61.2 kg   SpO2 94%   BMI 21.14 kg/m  Physical Exam Vitals and nursing note reviewed.  Constitutional:      General: She is not in acute distress.    Appearance: Normal appearance. She is not ill-appearing, toxic-appearing or diaphoretic.  HENT:     Head: Normocephalic and atraumatic.     Nose: Nose normal.     Mouth/Throat:     Mouth: Mucous membranes are moist.     Pharynx: Oropharynx is clear.  Eyes:     Extraocular Movements: Extraocular movements intact.     Conjunctiva/sclera: Conjunctivae normal.     Pupils: Pupils are equal, round, and reactive to light.  Pulmonary:     Effort: Pulmonary effort is normal.     Breath sounds: Normal breath sounds. No wheezing.     Comments: Distant lung sounds Abdominal:     General: Abdomen is flat.     Tenderness: There is no abdominal tenderness.  Skin:    General: Skin is warm and dry.     Capillary Refill: Capillary refill takes less than 2 seconds.  Neurological:     General: No focal deficit present.     Mental Status: She is alert and oriented to person, place, and time.     GCS: GCS eye subscore is 4. GCS verbal subscore is 5. GCS motor subscore is 6.     Cranial Nerves: Cranial nerves 2-12 are intact. No cranial nerve deficit.     Sensory: Sensation is intact. No sensory deficit.     Motor: Motor function is intact. No weakness.     ED Results / Procedures / Treatments   Labs (all labs ordered are listed, but only abnormal results are displayed) Labs Reviewed  BASIC METABOLIC PANEL - Abnormal; Notable for the following components:      Result Value   CO2 20 (*)    Glucose, Bld 174 (*)    Calcium  8.8 (*)    All other components within normal limits  CBC WITH DIFFERENTIAL/PLATELET - Abnormal; Notable for the following components:   WBC 13.8 (*)     Hemoglobin 11.9 (*)    MCH 25.3 (*)    RDW 18.0 (*)    Platelets 469 (*)    Neutro Abs 10.9 (*)    Abs Immature Granulocytes 0.09 (*)    All other components within normal limits  I-STAT VENOUS BLOOD GAS, ED - Abnormal; Notable for the following components:   pH, Ven 7.467 (*)    pCO2, Ven  32.4 (*)    pO2, Ven 72 (*)    Calcium , Ion 1.00 (*)    All other components within normal limits  TROPONIN I (HIGH SENSITIVITY) - Abnormal; Notable for the following components:   Troponin I (High Sensitivity) 465 (*)    All other components within normal limits  RESP PANEL BY RT-PCR (RSV, FLU A&B, COVID)  RVPGX2  BRAIN NATRIURETIC PEPTIDE  HEPARIN  LEVEL (UNFRACTIONATED)  HEPARIN  LEVEL (UNFRACTIONATED)  CBC  TROPONIN I (HIGH SENSITIVITY)    EKG EKG Interpretation Date/Time:  Sunday December 13 2023 04:04:15 EST Ventricular Rate:  109 PR Interval:  157 QRS Duration:  77 QT Interval:  346 QTC Calculation: 466 R Axis:   49  Text Interpretation: Sinus tachycardia Minimal ST depression, anterolateral leads Confirmed by Elnor Savant ([id number]) on 12/13/2023 4:08:41 AM  Radiology CT Angio Chest PE W and/or Wo Contrast Result Date: 12/13/2023 CLINICAL DATA:  68 year old female with sudden onset of shortness of breath. Suspected pulmonary embolism. EXAM: CT ANGIOGRAPHY CHEST WITH CONTRAST TECHNIQUE: Multidetector CT imaging of the chest was performed using the standard protocol during bolus administration of intravenous contrast. Multiplanar CT image reconstructions and MIPs were obtained to evaluate the vascular anatomy. RADIATION DOSE REDUCTION: This exam was performed according to the departmental dose-optimization program which includes automated exposure control, adjustment of the mA and/or kV according to patient size and/or use of iterative reconstruction technique. CONTRAST:  50mL OMNIPAQUE  IOHEXOL  350 MG/ML SOLN COMPARISON:  Chest CTA 11/13/2023. FINDINGS: Cardiovascular: No filling defects are noted  within the pulmonary arterial tree to suggest pulmonary embolism. Heart size is normal. There is no significant pericardial fluid, thickening or pericardial calcification. There is aortic atherosclerosis, as well as atherosclerosis of the great vessels of the mediastinum and the coronary arteries, including calcified atherosclerotic plaque in the left anterior descending and right coronary arteries. Mediastinum/Nodes: No pathologically enlarged mediastinal or hilar lymph nodes. Esophagus is unremarkable in appearance. No axillary lymphadenopathy. Lungs/Pleura: Diffuse bronchial wall thickening with moderate to severe centrilobular and paraseptal emphysema. No acute consolidative airspace disease. No pleural effusions. Scattered areas of linear scarring are noted throughout the lung bases, most severe in the periphery of the left lower lobe, likely sequela of remote infection. No definite suspicious appearing pulmonary nodules or masses are noted. Upper Abdomen: Aortic atherosclerosis. Musculoskeletal: There are no aggressive appearing lytic or blastic lesions noted in the visualized portions of the skeleton. Review of the MIP images confirms the above findings. IMPRESSION: 1. No acute findings are noted in the thorax to account for the patient's symptoms. Specifically, no evidence of pulmonary embolism. 2. Aortic atherosclerosis, in addition to two-vessel coronary artery disease. Please note that although the presence of coronary artery calcium  documents the presence of coronary artery disease, the severity of this disease and any potential stenosis cannot be assessed on this non-gated CT examination. Assessment for potential risk factor modification, dietary therapy or pharmacologic therapy may be warranted, if clinically indicated. 3. Diffuse bronchial wall thickening with moderate to severe centrilobular and paraseptal emphysema; imaging findings suggestive of underlying COPD. Aortic Atherosclerosis (ICD10-I70.0)  and Emphysema (ICD10-J43.9). Electronically Signed   By: Toribio Aye M.D.   On: 12/13/2023 06:04   DG Chest Port 1 View Result Date: 12/13/2023 CLINICAL DATA:  68 year old female with history of emphysema presenting with sudden onset of shortness of breath. EXAM: PORTABLE CHEST 1 VIEW COMPARISON:  Chest x-ray 11/13/2023. FINDINGS: Lung volumes are low. No consolidative airspace disease. No pleural effusions. No pneumothorax. No pulmonary nodule or  mass noted. Pulmonary vasculature and the cardiomediastinal silhouette are within normal limits. IMPRESSION: 1. Low lung volumes without radiographic evidence of acute cardiopulmonary disease. Electronically Signed   By: Toribio Aye M.D.   On: 12/13/2023 05:55    Procedures .Critical Care  Performed by: Ruthell Lonni FALCON, PA-C Authorized by: Ruthell Lonni FALCON, PA-C   Critical care provider statement:    Critical care time (minutes):  65   Critical care time was exclusive of:  Separately billable procedures and treating other patients   Critical care was necessary to treat or prevent imminent or life-threatening deterioration of the following conditions:  Cardiac failure   Critical care was time spent personally by me on the following activities:  Blood draw for specimens, development of treatment plan with patient or surrogate, discussions with primary provider, discussions with consultants, evaluation of patient's response to treatment, examination of patient, ordering and performing treatments and interventions, ordering and review of laboratory studies, ordering and review of radiographic studies, pulse oximetry, re-evaluation of patient's condition, review of old charts, obtaining history from patient or surrogate and interpretation of cardiac output measurements   I assumed direction of critical care for this patient from another provider in my specialty: no     Care discussed with: admitting provider      Medications Ordered in  ED Medications  aspirin  tablet 325 mg (has no administration in time range)  heparin  ADULT infusion 100 units/mL (25000 units/250mL) (700 Units/hr Intravenous New Bag/Given 12/13/23 0613)  ipratropium-albuterol  (DUONEB) 0.5-2.5 (3) MG/3ML nebulizer solution 3 mL (3 mLs Nebulization Given 12/13/23 0456)  methylPREDNISolone  sodium succinate (SOLU-MEDROL ) 125 mg/2 mL injection 125 mg (125 mg Intravenous Given 12/13/23 0456)  ondansetron  (ZOFRAN ) injection 4 mg (4 mg Intravenous Given 12/13/23 0512)  iohexol  (OMNIPAQUE ) 350 MG/ML injection 50 mL (50 mLs Intravenous Contrast Given 12/13/23 0538)  heparin  bolus via infusion 3,500 Units (3,500 Units Intravenous Bolus from Bag 12/13/23 9385)    ED Course/ Medical Decision Making/ A&P Clinical Course as of 12/13/23 9378  [place] Dec 13, 2023  0606 Dr. Lang [CG]    Clinical Course User Index [CG] Ruthell Lonni FALCON, PA-C   Medical Decision Making Amount and/or Complexity of Data Reviewed Labs: ordered. Radiology: ordered.  Risk Prescription drug management.   68 year old female presents for evaluation.  Please see HPI for further details.  On examination patient is with a temperature of 99.8 Fahrenheit, tachycardic to 113.  Lung sounds are distant bilaterally, patient is hypoxic and placed on 4 L of oxygen via nasal cannula.  Abdomen soft and compressible without tenderness.  Neurological examinations at baseline.  No edema to bilateral lower extremities.  Labs ordered include VBG, CBC, BMP, troponin x 2, viral panel, BNP.  Will also collect chest x-ray, CT angiogram due to tachycardia and new hypoxia.  Will treat patient with duo nebulizer, Solu-Medrol .  Patient CBC with leukocytosis of 13.8, baseline hemoglobin 11.9.  VBG shows venous pO2 of 72.  Troponin 465, delta pending.  Patient given bolus of heparin , aspirin . EKG shows no ST elevation.  Cardiology consulted, Dr. Lang, who reports they will admit patient.  Metabolic panel unremarkable.   BNP WNL.  Viral panel negative for all.  Chest x-ray unremarkable.  CTA shows findings consistent with COPD however no PE.   Final Clinical Impression(s) / ED Diagnoses Final diagnoses:  NSTEMI (non-ST elevated myocardial infarction) (HCC)    Rx / DC Orders ED Discharge Orders     None  Ruthell Lonni FALCON, PA-C 12/13/23 9378    Elnor Jayson LABOR, DO 12/13/23 [phone]

## 2023-12-13 NOTE — Progress Notes (Addendum)
 PHARMACY - ANTICOAGULATION CONSULT NOTE  Pharmacy Consult for IV heparin  Indication: chest pain/ACS  Allergies  Allergen Reactions   Morphine     Unknown reaction   Sulfa Antibiotics Nausea And Vomiting and Other (See Comments)    Made me sick    Patient Measurements: Height: 5' 7 (170.2 cm) Weight: 61.2 kg (135 lb) IBW/kg (Calculated) : 61.6 Heparin  Dosing Weight: 61.2 kg  Vital Signs: Temp: 97.8 F (36.6 C) (01/05 1405) Temp Source: Oral (01/05 1405) BP: 126/81 (01/05 1405) Pulse Rate: 92 (01/05 1408)  Labs: Recent Labs    12/13/23 0424 12/13/23 0434 12/13/23 0556 12/13/23 0640 12/13/23 1434  HGB 11.9* 13.3 11.4*  --   --   HCT 39.6 39.0 36.7  --   --   PLT 469*  --  449*  --   --   HEPARINUNFRC  --   --  <0.10* 1.01* 0.38  CREATININE 0.77  --   --   --   --   TROPONINIHS 465*  --  1,005*  --   --     Estimated Creatinine Clearance: 65 mL/min (by C-G formula based on SCr of 0.77 mg/dL).   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    Assessment: Sherry Zimmerman is a 68 y.o. year old female presented on 12/13/2023 with shob with new O2 requirement and now with concern for ACS. Troponin 465 in ED. No anticoagulation prior to admission. Pharmacy consulted to dose heparin .  PM: heparin  level is therapeutic at 0.38 on heparin  700 units/hr.  No issues with the heparin  infusion or bleeding reported by RN.  Goal of Therapy:  Heparin  level 0.3-0.7 units/ml Monitor platelets by anticoagulation protocol: Yes   Plan:  Continue heparin  infusion at 700 units/hr Check confirmatory heparin  level in 6hr Daily heparin  level, CBC, and monitoring for bleeding F/u [facility] plans after cardiac cath tomorrow  Thank you for allowing pharmacy to participate in this patient's care.  Rocky Slade, PharmD, BCPS 12/13/2023,4:31 PM   Please check AMION for all [facility] Pharmacy phone numbers After 10:00 PM, call Main Pharmacy [phone]  ADDENDUM: Confirmatory heparin  level is therapeutic at 0.33, but trending down on heparin  700 units/hr. No bleeding issues per RN. Will increase rate slightly 750 units/hr to prevent drop into subtherapeutic range. F/u level with AM labs.  Rocky Slade, PharmD, BCPS 12/13/2023 9:39 PM

## 2023-12-13 NOTE — Progress Notes (Signed)
 Patient Name: Sherry Zimmerman Date of Encounter: 12/13/2023 [facility] Cardiologist: None   Interval Summary  .    Denies anginal chest pain. Still remains short of breath on nasal cannula oxygen. Accompanied by her significant other at bedside.  Vital Signs .    Vitals:   12/13/23 0630 12/13/23 0645 12/13/23 0700 12/13/23 0715  BP: 123/84 124/87 134/88 (!) 122/96  Pulse: (!) 109 (!) 111 (!) 103 (!) 106  Resp: 17 (!) 23 (!) 24 (!) 24  Temp:      TempSrc:      SpO2: 96% 93% 91% 94%  Weight:      Height:       No intake or output data in the 24 hours ending 12/13/23 0833    12/13/2023    5:50 AM 11/17/2023    1:58 PM 11/13/2023   10:37 PM  Last 3 Weights  Weight (lbs) 135 lb 132 lb 12.8 oz 129 lb 6.6 oz  Weight (kg) 61.236 kg 60.238 kg 58.7 kg      Telemetry/ECG/cardiology studies    Sinus rhythm/sinus tachycardia- Personally Reviewed  EKG: 12/13/2023: Sinus tachycardia, 109 bpm, minimal anterolateral ST depressions consider ischemia, without underlying injury pattern  Sinus tachycardia, 112 bpm, minimal ST depressions in the anterolateral leads consider ischemia, prolonged QT  Echocardiogram: Pending  Physical Exam .   GEN: No acute distress.,  Appears older than stated age, hemodynamically stable Neck: No JVD Cardiac: Regular, tachycardic, no murmurs rubs or gallops appreciated secondary to tachycardia Respiratory: Decreased breath sounds bilaterally.  No rhonchi's. GI: Soft, nontender, non-distended  MS: No edema  Labs   Cardiac Panel (last 3 results) Recent Labs    12/13/23 0424 12/13/23 0556  TROPONINIHS 465* 1,005*      Latest Ref Rng & Units 12/13/2023    4:34 AM 12/13/2023    4:24 AM 11/14/2023    5:42 AM  CMP  Glucose 70 - 99 mg/dL  825  860   BUN 8 - 23 mg/dL  10  20   Creatinine 9.55 - 1.00 mg/dL  9.22  9.25   Sodium 864 - 145 mmol/L 137  135  136   Potassium 3.5 - 5.1 mmol/L 3.5  3.5  3.9   Chloride 98 - 111 mmol/L  102  104    CO2 22 - 32 mmol/L  20  22   Calcium  8.9 - 10.3 mg/dL  8.8  8.7      Assessment & Plan .     NSTEMI. Dyspnea on exertion. -Though she is not complaining of anginal discomfort.  A component of dyspnea could be her anginal equivalent. -CTA PE protocol negative for pulmonary embolism but did note coronary artery calcification. -EKG illustrates sinus mechanism but subtle ST depressions in the anterolateral leads raises a concern for ischemia -Echo will be ordered to evaluate for structural heart disease and left ventricular systolic function. -Continue antiplatelet therapy -Continue high intensity statin -Continue IV heparin  drip -Risks, benefits, alternatives with regards to left heart catheterization/coronary CTA discussed.  Shared decision was to proceed forward with left heart catheterization with possible intervention.  Informed Consent   Shared Decision Making/Informed Consent The risks [stroke (1 in 1000), death (1 in 1000), kidney failure [usually temporary] (1 in 500), bleeding (1 in 200), allergic reaction [possibly serious] (1 in 200)], benefits (diagnostic support and management of coronary artery disease) and alternatives of a cardiac catheterization were discussed in detail with [name] and she is willing to  proceed.     Coronary artery calcification noted on nongated CT study Aortic atherosclerosis -Continue antiplatelets and statin therapy -Ischemic workup as outlined above  Prolonged QT: Intermittently noted on surface ECGs. Clinically asymptomatic. Will add K-Dur 40 mg p.o. daily to get a potassium level of 4 and check magnesium. Patient takes 225 mg of Effexor  daily will reduce it down by 75 mg. Avoid prolonged QT medications and antibiotics..  COPD/emphysema - moderate to severe centrilobular and paraseptal emphysema based on recent CTA PE study. -Continue home inhalers. -Breathing treatments a as needed basis -Not requiring nasal cannula oxygen. -On  physical examination decreased breath sounds and abnormal ABGs -Will consult pulmonary medicine-patient follows up with Dr. Annella as outpatient  Depression/anxiety: Continue home medications.  History of cerebellar ataxia -monitor for now  Former smoker-quit October 2024, 50-year pack history.  For questions or updates, please contact [facility] Please consult [email or web address] for contact info under      Signed, Madonna Large, DO, [facility]  [facility]  [address] [place], [place] [address] Pager: [phone] Office: [phone] 8:38 AM 12/13/23

## 2023-12-13 NOTE — Plan of Care (Signed)

## 2023-12-13 NOTE — H&P (Signed)
 Cardiology Admission History and Physical   Patient ID: Sherry Zimmerman MRN: [id number]; DOB: [birth year]   Admission date: 12/13/2023  PCP:  Sherry Lavern LITTIE, MD   [facility] Providers Cardiologist:  None        Chief Complaint:  shortness of breath  Patient Profile:   Sherry Zimmerman is a 68 y.o. female with COPD, former tobacco use (quit in October 2024), depression/anxiety, cerebellar ataxia, IDA who is being seen 12/13/2023 for the evaluation of acute worsening of her shortness of breath with NSTEMI.  History of Present Illness:   [name] has about a 50-pack-year history of smoking and significant COPD/emphysema.  Was recently hospitalized for a COPD exacerbation in early December 2024.  At that time her workup did include a troponin which was normal on multiple checks.  Notes that she has had pretty significant shortness of breath and dyspnea on exertion over the last several months even despite quitting smoking.  However, over the last 2 to 3 days her dyspnea has been severe and at rest.  Noted that she had severe shortness of breath throughout the night and even lying in bed.  On presentation emergency department she is satting in the mid 90s on 4 L nasal cannula.  She is tachycardic to the low 100s.  Her laboratory evaluation is unremarkable except for a initial troponin of 465.  Repeat pending.  She has no other clear reason for just a COPD exacerbation.  Thus cardiology was called for consultation and admission.     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    Past Surgical History:  Procedure Laterality Date   ABDOMINAL HYSTERECTOMY     CATARACT EXTRACTION Bilateral 01/17/2020   SPLENECTOMY, TOTAL N/A 10/26/2014   Procedure: SPLENECTOMY;  Surgeon: Sherry Hummer, MD;  Location: [facility] OR;  Service: General;  Laterality: N/A;   WRIST FRACTURE SURGERY Right      Medications Prior to Admission: Prior to Admission medications   Medication Sig Start Date End Date Taking? Authorizing Provider  albuterol  (VENTOLIN  HFA) 108 (90 Base) MCG/ACT inhaler Inhale 1-2 puffs into the lungs every 4 (four) hours as needed for wheezing or shortness of breath. 07/02/23   Sherry Celinda Balo, MD  alendronate  (FOSAMAX ) 70 MG tablet Take 1 tablet (70 mg total) by mouth every 7 (seven) days. Take with a full glass of water on an empty stomach. 10/07/23   Sherry Lavern LITTIE, MD  atorvastatin  (LIPITOR ) 10 MG tablet Take 1 tablet (10 mg total) by mouth at bedtime. 10/07/23   Sherry Lavern LITTIE, MD  calcium -vitamin D  (OSCAL WITH D) 500-5 MG-MCG tablet Take 1 tablet by mouth daily with breakfast. 10/07/23   Sherry Lavern LITTIE, MD  cyanocobalamin  (VITAMIN B12) 500 MCG tablet Take 500 mcg by mouth daily.    [provider]  ferrous sulfate  325 (65 FE) MG EC tablet Take 325 mg by mouth daily.    [provider]  Glycopyrrolate -Formoterol  (BEVESPI  AEROSPHERE) 9-4.8 MCG/ACT AERO Inhale 2 puffs into the lungs 2 (two) times daily. 10/07/23   Sherry Lavern CROME, MD  Na Sulfate-K Sulfate-Mg Sulf 17.5-3.13-1.6 GM/177ML SOLN Use as directed; may use generic; goodrx card if insurance will not cover generic 11/17/23   Sherry Vina HERO, NP  oxybutynin  (DITROPAN ) 5 MG tablet Take 1 tablet (5 mg total) by mouth 2 (two) times daily. 10/07/23   Sherry Lavern CROME, MD  valACYclovir   (VALTREX ) 1000 MG tablet Take 2 tablets (2,000 mg total) by mouth 2 (two) times daily. Once, as needed for cold sores 10/07/23   Sherry Lavern CROME, MD  venlafaxine  XR (EFFEXOR -XR) 150 MG 24 hr capsule TAKE 1 CAPSULE BY MOUTH AT BEDTIME **TAKE  WITH  75MG   CAPSULES  FOR  225MG   TOTAL  DAILY  DOSE Patient taking differently: Take 150 mg by mouth at bedtime. 10/07/23   Sherry Lavern CROME, MD  venlafaxine  XR (EFFEXOR -XR) 75 MG 24 hr capsule TAKE 1 CAPSULE BY MOUTH ONCE DAILY **TAKE  WITH  150MG   CAPSULES  FOR  225MG   TOTAL  DAILY  DOSE Patient taking differently: Take 75 mg by mouth at bedtime. TAKE 1 CAPSULE BY MOUTH ONCE DAILY **TAKE  WITH  150MG   CAPSULES  FOR  225MG   TOTAL  DAILY  DOSE 10/07/23   Sherry Lavern CROME, MD     Allergies:    Allergies  Allergen Reactions   Morphine     Unknown reaction   Sulfa Antibiotics Nausea And Vomiting and Other (See Comments)    Made me sick    Social History:   Social History   Socioeconomic History   Marital status: Divorced    Spouse name: Not on file   Number of children: 2   Years of education: Not on file   Highest education level: Not on file  Occupational History   Occupation: disabled  Tobacco Use   Smoking status: Former    Average packs/day: 1 pack/day for 50.0 years (50.0 ttl pk-yrs)    Types: Cigarettes    Start date: 07/2021    Quit date: 12/08/1978    Years since quitting: 45.0   Smokeless tobacco: Never   Tobacco comments:    Quit on easter of 2022  Vaping Use   Vaping status: Never Used  Substance and Sexual Activity   Alcohol use: Not Currently    Comment: socially   Drug use: No   Sexual activity: Not Currently  Other Topics Concern   Not on file  Social History Narrative   Not on file   Social Drivers of Health   Financial Resource Strain: Low Risk  (10/01/2023)   Overall Financial Resource Strain (CARDIA)    Difficulty of Paying Living Expenses: Not very hard  Food Insecurity: No Food Insecurity (11/14/2023)    Hunger Vital Sign    Worried About Running Out of Food in the Last Year: Never true    Ran Out of Food in the Last Year: Never true  Transportation Needs: No Transportation Needs (11/14/2023)   PRAPARE - Administrator, Civil Service (Medical): No    Lack of Transportation (Non-Medical): No  Physical Activity: Inactive (10/01/2023)   Exercise Vital Sign    Days of Exercise per Week: 0 days    Minutes of Exercise per Session: 0 min  Stress: No Stress Concern  Present (10/01/2023)   [employer] of Occupational Health - Occupational Stress Questionnaire    Feeling of Stress : Not at all  Social Connections: Moderately Integrated (10/01/2023)   Social Connection and Isolation Panel [NHANES]    Frequency of Communication with Friends and Family: Twice a week    Frequency of Social Gatherings with Friends and Family: Three times a week    Attends Religious Services: 1 to 4 times per year    Active Member of Clubs or Organizations: No    Attends Banker Meetings: Never    Marital Status: Living with partner  Recent Concern: Social Connections - Moderately Isolated (08/25/2023)   Social Connection and Isolation Panel [NHANES]    Frequency of Communication with Friends and Family: More than three times a week    Frequency of Social Gatherings with Friends and Family: More than three times a week    Attends Religious Services: Never    Database Administrator or Organizations: No    Attends Banker Meetings: Never    Marital Status: Living with partner  Intimate Partner Violence: Not At Risk (11/14/2023)   Humiliation, Afraid, Rape, and Kick questionnaire    Fear of Current or Ex-Partner: No    Emotionally Abused: No    Physically Abused: No    Sexually Abused: No    Family History:   The patient's family history includes Ataxia in her mother; Cancer in her father; Diabetes in her mother; Heart disease in her mother; Stroke in her mother.    ROS:   Please see the history of present illness.  All other ROS reviewed and negative.     Physical Exam/Data:   Vitals:   12/13/23 0408 12/13/23 0550  BP: 138/89   Pulse: (!) 113   Resp: 20   Temp: 99.8 F (37.7 C)   TempSrc: Oral   SpO2: 94%   Weight:  61.2 kg  Height:  5' 7 (1.702 m)   No intake or output data in the 24 hours ending 12/13/23 0656    12/13/2023    5:50 AM 11/17/2023    1:58 PM 11/13/2023   10:37 PM  Last 3 Weights  Weight (lbs) 135 lb 132 lb 12.8 oz 129 lb 6.6 oz  Weight (kg) 61.236 kg 60.238 kg 58.7 kg     Body mass index is 21.14 kg/m.  General:   in no acute distress, thin female on oxygen HEENT: normal Neck: no JVD Vascular: No carotid bruits; Distal pulses 2+ bilaterally   Cardiac:  normal S1, S2; RRR; no murmur  Lungs: Coarse breath sounds throughout with end expiratory wheezing Abd: soft, nontender, no hepatomegaly  Ext: no edema Musculoskeletal:  No deformities, BUE and BLE strength normal and equal Skin: warm and dry  Neuro:  CNs 2-12 intact, no focal abnormalities noted Psych:  Normal affect    EKG:  The ECG that was done  was personally reviewed and demonstrates normal sinus rhythm without ST or T wave changes  Relevant CV Studies: None  Laboratory Data:  High Sensitivity Troponin:   Recent Labs  Lab 11/13/23 1531 11/13/23 1728 12/13/23 0424  TROPONINIHS 5 5 465*      Chemistry Recent Labs  Lab 12/13/23 0424 12/13/23 0434  NA 135 137  K 3.5 3.5  CL 102  --   CO2 20*  --   GLUCOSE 174*  --   BUN 10  --   CREATININE 0.77  --   CALCIUM   8.8*  --   GFRNONAA >60  --   ANIONGAP 13  --     No results for input(s): PROT, ALBUMIN, AST, ALT, ALKPHOS, BILITOT in the last 168 hours. Lipids No results for input(s): CHOL, TRIG, HDL, LABVLDL, LDLCALC, CHOLHDL in the last 168 hours. Hematology Recent Labs  Lab 12/13/23 0424 12/13/23 0434 12/13/23 0556  WBC 13.8*  --  14.4*  RBC 4.70  --  4.40  HGB 11.9*  13.3 11.4*  HCT 39.6 39.0 36.7  MCV 84.3  --  83.4  MCH 25.3*  --  25.9*  MCHC 30.1  --  31.1  RDW 18.0*  --  17.9*  PLT 469*  --  449*   Thyroid  No results for input(s): TSH, FREET4 in the last 168 hours. BNP Recent Labs  Lab 12/13/23 0424  BNP 88.0    DDimer No results for input(s): DDIMER in the last 168 hours.   Radiology/Studies:  CT Angio Chest PE W and/or Wo Contrast Result Date: 12/13/2023 CLINICAL DATA:  68 year old female with sudden onset of shortness of breath. Suspected pulmonary embolism. EXAM: CT ANGIOGRAPHY CHEST WITH CONTRAST TECHNIQUE: Multidetector CT imaging of the chest was performed using the standard protocol during bolus administration of intravenous contrast. Multiplanar CT image reconstructions and MIPs were obtained to evaluate the vascular anatomy. RADIATION DOSE REDUCTION: This exam was performed according to the departmental dose-optimization program which includes automated exposure control, adjustment of the mA and/or kV according to patient size and/or use of iterative reconstruction technique. CONTRAST:  50mL OMNIPAQUE  IOHEXOL  350 MG/ML SOLN COMPARISON:  Chest CTA 11/13/2023. FINDINGS: Cardiovascular: No filling defects are noted within the pulmonary arterial tree to suggest pulmonary embolism. Heart size is normal. There is no significant pericardial fluid, thickening or pericardial calcification. There is aortic atherosclerosis, as well as atherosclerosis of the great vessels of the mediastinum and the coronary arteries, including calcified atherosclerotic plaque in the left anterior descending and right coronary arteries. Mediastinum/Nodes: No pathologically enlarged mediastinal or hilar lymph nodes. Esophagus is unremarkable in appearance. No axillary lymphadenopathy. Lungs/Pleura: Diffuse bronchial wall thickening with moderate to severe centrilobular and paraseptal emphysema. No acute consolidative airspace disease. No pleural effusions. Scattered  areas of linear scarring are noted throughout the lung bases, most severe in the periphery of the left lower lobe, likely sequela of remote infection. No definite suspicious appearing pulmonary nodules or masses are noted. Upper Abdomen: Aortic atherosclerosis. Musculoskeletal: There are no aggressive appearing lytic or blastic lesions noted in the visualized portions of the skeleton. Review of the MIP images confirms the above findings. IMPRESSION: 1. No acute findings are noted in the thorax to account for the patient's symptoms. Specifically, no evidence of pulmonary embolism. 2. Aortic atherosclerosis, in addition to two-vessel coronary artery disease. Please note that although the presence of coronary artery calcium  documents the presence of coronary artery disease, the severity of this disease and any potential stenosis cannot be assessed on this non-gated CT examination. Assessment for potential risk factor modification, dietary therapy or pharmacologic therapy may be warranted, if clinically indicated. 3. Diffuse bronchial wall thickening with moderate to severe centrilobular and paraseptal emphysema; imaging findings suggestive of underlying COPD. Aortic Atherosclerosis (ICD10-I70.0) and Emphysema (ICD10-J43.9). Electronically Signed   By: Toribio Aye M.D.   On: 12/13/2023 06:04   DG Chest Port 1 View Result Date: 12/13/2023 CLINICAL DATA:  68 year old female with history of emphysema presenting with sudden onset of shortness of breath. EXAM: PORTABLE CHEST 1 VIEW COMPARISON:  Chest x-ray  11/13/2023. FINDINGS: Lung volumes are low. No consolidative airspace disease. No pleural effusions. No pneumothorax. No pulmonary nodule or mass noted. Pulmonary vasculature and the cardiomediastinal silhouette are within normal limits. IMPRESSION: 1. Low lung volumes without radiographic evidence of acute cardiopulmonary disease. Electronically Signed   By: Toribio Aye M.D.   On: 12/13/2023 05:55      Assessment and Plan:   NSTEMI Given the change in symptoms and the elevated troponin believe that her symptoms are most likely driven this time by coronary disease.  She had a CTA for PE and did show two-vessel coronary artery disease.  Unable to determine the severity, but does look like there is plaque in both the LAD and RCA.  Thus I think she warrants coronary angiography to further evaluate.  Will get an echo to evaluate as well.  Plan: - npo after midnight for [facility] tomorrow - echo ordered for today - heparin  for anticoagulation - loaded 325 mg ASA; continue 81 mg daily  - hold off on p2y12i until cath (higher chance of 3vCAD given smoking history) - start high intensity statin with atorvastatin  80mg ; check lipid panel and LP(a) - will start on metoprolol  12.5 mg for beta blocker therapy - will check CBC, CMP, INR, hemoglobin A1c, tsh/FT4 - referral for cardiac rehab - admit for cardiac tele; strict I&Os; daily weights    COPD/emphysema - continue home inhalers. PRN albuterol  nebs.  Depression/anxiety - continue effecor-XR bid Cerebellar ataxia- can order PT/OT eval prior to discharge    Risk Assessment/Risk Scores:    TIMI Risk Score for Unstable Angina or Non-ST Elevation MI:   The patient's TIMI risk score is 3, which indicates a 13% risk of all cause mortality, new or recurrent myocardial infarction or need for urgent revascularization in the next 14 days.    Code Status: Full Code  Severity of Illness: The appropriate patient status for this patient is INPATIENT. Inpatient status is judged to be reasonable and necessary in order to provide the required intensity of service to ensure the patient's safety. The patient's presenting symptoms, physical exam findings, and initial radiographic and laboratory data in the context of their chronic comorbidities is felt to place them at high risk for further clinical deterioration. Furthermore, it is not anticipated that the  patient will be medically stable for discharge from the hospital within 2 midnights of admission.   * I certify that at the point of admission it is my clinical judgment that the patient will require inpatient hospital care spanning beyond 2 midnights from the point of admission due to high intensity of service, high risk for further deterioration and high frequency of surveillance required.*   For questions or updates, please contact [facility] Please consult [email or web address] for contact info under     Signed, Marinda Lang Raddle, MD  12/13/2023 6:56 AM

## 2023-12-13 NOTE — ED Notes (Signed)
Christopher PA at bedside 

## 2023-12-13 NOTE — Consult Note (Addendum)
 NAME:  Sherry Zimmerman, MRN:  [id number], DOB:  1955/12/29, LOS: 0 ADMISSION DATE:  12/13/2023, CONSULTATION DATE:  12/13/2023 REFERRING MD:  Alvan Dorn FALCON, MD, CHIEF COMPLAINT:  copd/emphysema  History of Present Illness:   Sherry Zimmerman is a 68 y.o. woman with significant tobacco use disorder and recent cessation in the last 6 months who presents for hypoxemic respiratory failure.  She has had shortness of breath that has been worsening over the last several months.  Usually with exertion.  Her mobility is limited due to her ataxia so she does not move very much and barely leaves the home.  This time she had checked her oxygen was at home and they were low which is what prompted her to come to the ED.  She was found to have an elevated troponin of 2000.  EKG showed sinus rhythm with a borderline QT prolongation.  Cardiology was consulted and they are planning a doing a left heart cath tomorrow.  Pulmonary was consulted for evaluation of her COPD.  She has seen Dr. Annella in the past in our office.  She has been on Bevespi  and albuterol  as needed.  She has been taking her inhalers more frequently the last several days but does not feel they have been very effective.  She denies cough, fevers, wheezing.  She was post to have pulmonary function testing done as a repeat this coming Tuesday. Pertinent  Medical History  Emphysema Tobacco use disorder, recent cessation Osteoporosis  Significant Hospital Events: Including procedures, antibiotic start and stop dates in addition to other pertinent events     Interim History / Subjective:    Objective   Blood pressure 109/84, pulse 75, temperature 98.3 F (36.8 C), temperature source Oral, resp. rate 17, height 5' 7 (1.702 m), weight 61.2 kg, SpO2 97%.       No intake or output data in the 24 hours ending 12/13/23 1148 Filed Weights   12/13/23 0550  Weight: 61.2 kg    Examination: General: chronically ill appearing, on nasal  cannula HENT: mmm Lungs: breath sounds diminished in upper lobes, otherwise non labored, no wheeze or coughing Cardiovascular: tachycardic, regular Abdomen: scaphoid, soft Extremities: no  peripheral edema Neuro: normal speech no focal asymmetry, hoarse voice  I have personally reviewed her CT chest from December 13, 2023 which shows severe a blood predominant centrilobular emphysema, no pulmonary embolism  PFTs from 2019 show preserved FEV1 to FVC ratio however there is a severely reduced DLCO and evidence of hyperinflation and air trapping  Resolved Hospital Problem list     Assessment & Plan:  Acute hypoxemic respiratory failure COPD with emphysema Tobacco use disorder, recent cessation 09/2023 - continue incruse, brovana  - albuterol  prn for wheezing - will work on reschedule for PFTs - no evidence for copd exacerbation - defer steroids at this time  - probably would benefit from nebulizer machine and nebs at discharge -Ambulatory desat study prior to discharge  NSTEMI - for [facility] tomorrow per cardiology  Best Practice (right click and Reselect all SmartList Selections daily)   PCCM will follow.  Per primary Verdon Gore, MD Pulmonary and Critical Care Medicine [facility] 12/13/2023 12:02 PM Pager: see AMION  If no response to pager, please call critical care on call (see AMION) until 7pm After 7:00 pm call Elink     Labs   CBC: Recent Labs  Lab 12/13/23 0424 12/13/23 0434 12/13/23 0556  WBC 13.8*  --  14.4*  NEUTROABS 10.9*  --   --  HGB 11.9* 13.3 11.4*  HCT 39.6 39.0 36.7  MCV 84.3  --  83.4  PLT 469*  --  449*    Basic Metabolic Panel: Recent Labs  Lab 12/13/23 0424 12/13/23 0434 12/13/23 0646  NA 135 137  --   K 3.5 3.5  --   CL 102  --   --   CO2 20*  --   --   GLUCOSE 174*  --   --   BUN 10  --   --   CREATININE 0.77  --   --   CALCIUM  8.8*  --   --   MG  --   --  1.9   GFR: Estimated Creatinine Clearance: 65 mL/min (by C-G  formula based on SCr of 0.77 mg/dL). Recent Labs  Lab 12/13/23 0424 12/13/23 0556  WBC 13.8* 14.4*    Liver Function Tests: No results for input(s): AST, ALT, ALKPHOS, BILITOT, PROT, ALBUMIN in the last 168 hours. No results for input(s): LIPASE, AMYLASE in the last 168 hours. No results for input(s): AMMONIA in the last 168 hours.  ABG    Component Value Date/Time   HCO3 23.4 12/13/2023 0434   TCO2 24 12/13/2023 0434   O2SAT 95 12/13/2023 0434     Coagulation Profile: No results for input(s): INR, PROTIME in the last 168 hours.  Cardiac Enzymes: No results for input(s): CKTOTAL, CKMB, CKMBINDEX, TROPONINI in the last 168 hours.  HbA1C: No results found for: HGBA1C  CBG: No results for input(s): GLUCAP in the last 168 hours.  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 Her smoking use included cigarettes. She started smoking about 2 years ago. She has a 50 pack-year smoking  history. She has never used smokeless tobacco. She reports that she does not currently use alcohol. She reports that she does not use drugs.   Family History:  Her family history includes Ataxia in her mother; Cancer in her father; Diabetes in her mother; Heart disease in her mother; Stroke in her mother.   Allergies Allergies  Allergen Reactions   Morphine     Unknown reaction   Sulfa Antibiotics Nausea And Vomiting and Other (See Comments)    Made me sick     Home Medications  Prior to Admission medications   Medication Sig Start Date End Date Taking? Authorizing Provider  albuterol  (VENTOLIN  HFA) 108 (90 Base) MCG/ACT inhaler Inhale 1-2 puffs into the lungs every 4 (four) hours as needed for wheezing or shortness of breath. 07/02/23  Yes Odell Celinda Balo, MD  alendronate  (FOSAMAX ) 70 MG tablet Take 1 tablet (70 mg  total) by mouth every 7 (seven) days. Take with a full glass of water on an empty stomach. 10/07/23  Yes Jodie Lavern CROME, MD  atorvastatin  (LIPITOR ) 10 MG tablet Take 1 tablet (10 mg total) by mouth at bedtime. 10/07/23  Yes Jodie Lavern CROME, MD  calcium -vitamin D  RONNETTE WITH D) 500-5 MG-MCG tablet Take 1 tablet by mouth daily with breakfast. 10/07/23  Yes Jodie Lavern CROME, MD  cyanocobalamin  (VITAMIN B12) 500 MCG tablet Take 500 mcg by mouth daily.   Yes [provider]  ferrous sulfate  325 (65 FE) MG EC tablet Take 325 mg by mouth daily.   Yes [provider]  Glycopyrrolate -Formoterol  (BEVESPI  AEROSPHERE) 9-4.8 MCG/ACT AERO Inhale 2 puffs into the lungs 2 (two) times daily. 10/07/23  Yes Jodie Lavern CROME, MD  oxybutynin  (DITROPAN ) 5 MG tablet Take 1 tablet (5 mg total) by mouth 2 (two) times daily. 10/07/23  Yes Jodie Lavern CROME, MD  valACYclovir  (VALTREX ) 1000 MG tablet Take 2 tablets (2,000 mg total) by mouth 2 (two) times daily. Once, as needed for cold sores 10/07/23  Yes Jodie Lavern CROME, MD  venlafaxine  XR (EFFEXOR -XR) 150 MG 24 hr capsule TAKE 1  CAPSULE BY MOUTH AT BEDTIME **TAKE  WITH  75MG   CAPSULES  FOR  225MG   TOTAL  DAILY  DOSE Patient taking differently: Take 150 mg by mouth at bedtime. 10/07/23  Yes Jodie Lavern CROME, MD  venlafaxine  XR (EFFEXOR -XR) 75 MG 24 hr capsule TAKE 1 CAPSULE BY MOUTH ONCE DAILY **TAKE  WITH  150MG   CAPSULES  FOR  225MG   TOTAL  DAILY  DOSE Patient taking differently: Take 75 mg by mouth at bedtime. TAKE 1 CAPSULE BY MOUTH ONCE DAILY **TAKE  WITH  150MG   CAPSULES  FOR  225MG   TOTAL  DAILY  DOSE 10/07/23  Yes Jodie Lavern CROME, MD  Na Sulfate-K Sulfate-Mg Sulf 17.5-3.13-1.6 GM/177ML SOLN Use as directed; may use generic; goodrx card if insurance will not cover generic Patient not taking: Reported on 12/13/2023 11/17/23   Kerman Vina HERO, NP

## 2023-12-13 NOTE — Plan of Care (Signed)
 Cross Cover Note   Patient requesting medication for dry cough. We will try tessalon perles; trying to avoid narcotic cough suppressant (codeine) day prior to [facility].   Achille Rich, MD Cardiology

## 2023-12-13 NOTE — Progress Notes (Signed)
 Echocardiogram 2D Echocardiogram has been performed.  Sherry Zimmerman,RDCS 12/13/2023, 3:37 PM

## 2023-12-14 ENCOUNTER — Inpatient Hospital Stay (HOSPITAL_COMMUNITY): Payer: Medicare Other

## 2023-12-14 DIAGNOSIS — R0603 Acute respiratory distress: Secondary | ICD-10-CM

## 2023-12-14 DIAGNOSIS — I214 Non-ST elevation (NSTEMI) myocardial infarction: Secondary | ICD-10-CM | POA: Diagnosis not present

## 2023-12-14 DIAGNOSIS — I2510 Atherosclerotic heart disease of native coronary artery without angina pectoris: Secondary | ICD-10-CM | POA: Diagnosis not present

## 2023-12-14 DIAGNOSIS — I700 Atherosclerosis of aorta: Secondary | ICD-10-CM | POA: Diagnosis not present

## 2023-12-14 LAB — CBC
HCT: 36.2 % (ref 36.0–46.0)
Hemoglobin: 11.1 g/dL — ABNORMAL LOW (ref 12.0–15.0)
MCH: 25.5 pg — ABNORMAL LOW (ref 26.0–34.0)
MCHC: 30.7 g/dL (ref 30.0–36.0)
MCV: 83 fL (ref 80.0–100.0)
Platelets: 400 10*3/uL (ref 150–400)
RBC: 4.36 MIL/uL (ref 3.87–5.11)
RDW: 17.9 % — ABNORMAL HIGH (ref 11.5–15.5)
WBC: 11.5 10*3/uL — ABNORMAL HIGH (ref 4.0–10.5)
nRBC: 0 % (ref 0.0–0.2)

## 2023-12-14 LAB — LIPID PANEL
Cholesterol: 131 mg/dL (ref 0–200)
HDL: 48 mg/dL (ref >40)
LDL Cholesterol: 64 mg/dL (ref 0–99)
Total CHOL/HDL Ratio: 2.7 {ratio}
Triglycerides: 95 mg/dL (ref <150)
VLDL: 19 mg/dL (ref 0–40)

## 2023-12-14 LAB — BASIC METABOLIC PANEL
Anion gap: 9 (ref 5–15)
BUN: 14 mg/dL (ref 8–23)
CO2: 25 mmol/L (ref 22–32)
Calcium: 8.8 mg/dL — ABNORMAL LOW (ref 8.9–10.3)
Chloride: 105 mmol/L (ref 98–111)
Creatinine, Ser: 0.82 mg/dL (ref 0.44–1.00)
GFR, Estimated: >60 mL/min (ref >60)
Glucose, Bld: 100 mg/dL — ABNORMAL HIGH (ref 70–99)
Potassium: 4.1 mmol/L (ref 3.5–5.1)
Sodium: 139 mmol/L (ref 135–145)

## 2023-12-14 LAB — PROTIME-INR
INR: 1 (ref 0.8–1.2)
Prothrombin Time: 13.2 s (ref 11.4–15.2)

## 2023-12-14 LAB — PROCALCITONIN: Procalcitonin: <0.1 ng/mL

## 2023-12-14 LAB — HEPARIN LEVEL (UNFRACTIONATED): Heparin Unfractionated: 0.36 [IU]/mL (ref 0.30–0.70)

## 2023-12-14 MED ORDER — LORAZEPAM 2 MG/ML IJ SOLN
1.0000 mg | INTRAMUSCULAR | Status: DC | PRN
  Administered 2023-12-14 – 2023-12-16 (×4): 1 mg via INTRAVENOUS
  Filled 2023-12-14 (×4): qty 1

## 2023-12-14 MED ORDER — DOXYCYCLINE HYCLATE 100 MG PO TABS
100.0000 mg | ORAL_TABLET | Freq: Two times a day (BID) | ORAL | Status: DC
  Administered 2023-12-14 – 2023-12-17 (×6): 100 mg via ORAL
  Filled 2023-12-14 (×6): qty 1

## 2023-12-14 MED ORDER — LIDOCAINE HCL (PF) 1 % IJ SOLN
INTRAMUSCULAR | Status: AC
  Filled 2023-12-14: qty 30

## 2023-12-14 MED ORDER — VERAPAMIL HCL 2.5 MG/ML IV SOLN
INTRAVENOUS | Status: AC
  Filled 2023-12-14: qty 2

## 2023-12-14 MED ORDER — REVEFENACIN 175 MCG/3ML IN SOLN
175.0000 ug | Freq: Every day | RESPIRATORY_TRACT | Status: DC
  Administered 2023-12-14 – 2023-12-17 (×4): 175 ug via RESPIRATORY_TRACT
  Filled 2023-12-14 (×4): qty 3

## 2023-12-14 MED ORDER — FUROSEMIDE 10 MG/ML IJ SOLN
40.0000 mg | Freq: Once | INTRAMUSCULAR | Status: AC
  Administered 2023-12-14: 40 mg via INTRAVENOUS
  Filled 2023-12-14: qty 4

## 2023-12-14 MED ORDER — METHYLPREDNISOLONE SODIUM SUCC 40 MG IJ SOLR
40.0000 mg | Freq: Two times a day (BID) | INTRAMUSCULAR | Status: DC
  Administered 2023-12-14 (×2): 40 mg via INTRAVENOUS
  Filled 2023-12-14 (×2): qty 1

## 2023-12-14 NOTE — H&P (View-Only) (Signed)
 Patient Name: Sherry Zimmerman Date of Encounter: 12/14/2023 [facility] Cardiologist: None   Interval Summary  .    Denies anginal chest pain. Worsening shortness of breath, tachypnea, increased nasal cannula oxygen support, no significant improvement with breathing treatments.  Vital Signs .    Vitals:   12/14/23 0749 12/14/23 1100 12/14/23 1112 12/14/23 1123  BP:  (!) 168/89  (!) 168/89  Pulse: 78 (!) 120 (!) 110 (!) 112  Resp: 18 (!) 37 (!) 27 (!) 32  Temp:      TempSrc:      SpO2:  93% 96% 94%  Weight:      Height:        Intake/Output Summary (Last 24 hours) at 12/14/2023 1208 Last data filed at 12/14/2023 0900 Gross per 24 hour  Intake 210.55 ml  Output 700 ml  Net -489.45 ml      12/13/2023    7:44 PM 12/13/2023    2:05 PM 12/13/2023    5:50 AM  Last 3 Weights  Weight (lbs) 132 lb 4.4 oz 131 lb 6.3 oz 135 lb  Weight (kg) 60 kg 59.6 kg 61.236 kg      Telemetry/ECG    Sinus rhythm/sinus tachycardia- Personally Reviewed  Physical Exam .   GEN: Older than stated age, respiratory distress, sitting upright, hemodynamically stable Neck: No JVD Cardiac: Regular, tachycardic, Respiratory: Poor inspiratory effort, decreased breath sounds bilaterally, rhonchi's and Rales present GI: Soft, nontender, non-distended  MS: No edema  Assessment & Plan .    Acute respiratory distress NSTEMI Coronary calcification noted on nongated CT study. Aortic atherosclerosis. COPD/ emphysema - moderate to severe centrilobular and paraseptal emphysema based on recent CTA PE study.  Depression/anxiety. History of cerebellar ataxia. Former smoker  Presented with dyspnea on exertion suspected to be anginal equivalent. CT PE protocol negative for pulmonary embolism but did note coronary artery calcification. Initial EKG noted sinus rhythm with subtle ST depressions in the anterolateral leads EKG noted mild reduced LVEF with regional wall motion abnormality. Scheduled for  left heart catheterization with possible intervention later today.  However, nursing staff reached out stating that she was more short of breath with increased work of breathing. No significant change with breathing treatments. On physical examination she is sitting upright, increased oxygen supplementation, decreased breath sounds bilaterally, bilateral rhonchi's and Rales.  Stat EKG. Stat chest x-ray portable. Stat ABG. BiPAP ordered  Reached out to pulmonary medicine to help coordinate her care given her underlying COPD/emphysema and acute hypoxia respiratory distress.  Dr. Claudene aware and will come evaluate the patient.  Significant other is present at bedside.  Their questions and concerns addressed.  CRITICAL CARE Performed by: Madonna Large, DO, [facility] ([facility])   Total critical care time: 36 minutes   Critical care time was exclusive of separately billable procedures and treating other patients.   Critical care was necessary to treat or prevent imminent or life-threatening deterioration.   Critical care was time spent personally by me on the following activities: development of treatment plan with patient and/or surrogate as well as nursing, discussions with consultants, evaluation of patient's response to treatment, examination of patient, ordering and performing treatments and interventions, ordering and review of laboratory studies, ordering and review of radiographic studies, pulse oximetry and re-evaluation of patient's condition.  For questions or updates, please contact [facility] Please consult [email or web address] for contact info under     Signed, Madonna Large, DO, [facility] [place]  [facility]  [address] [place], [place] [address] Pager: [phone] Office: [phone] 12:08 PM 12/14/23  Addendum:  EKG noted no STEMI Feels better on BiPAP.  ABG orders were held for now per CCM  CXR notes emphysema - no overt HF findings.  Greatly appreciate the help  of CCM.  Care discussed w/ patient, significant other, and RN.   Mane Consolo Brewer, DO, FACC

## 2023-12-14 NOTE — Plan of Care (Signed)
  Problem: Activity: Goal: Ability to tolerate increased activity will improve Outcome: Progressing   Problem: Cardiac: Goal: Ability to achieve and maintain adequate cardiovascular perfusion will improve Outcome: Progressing   Problem: Health Behavior/Discharge Planning: Goal: Ability to safely manage health-related needs after discharge will improve Outcome: Progressing   Problem: Education: Goal: Knowledge of General Education information will improve Description: Including pain rating scale, medication(s)/side effects and non-pharmacologic comfort measures Outcome: Progressing

## 2023-12-14 NOTE — Progress Notes (Signed)
 PHARMACY - ANTICOAGULATION CONSULT NOTE  Pharmacy Consult for IV heparin  Indication: chest pain/ACS  Allergies  Allergen Reactions   Morphine     Unknown reaction   Sulfa Antibiotics Nausea And Vomiting and Other (See Comments)    Made me sick    Patient Measurements: Height: 5' 7 (170.2 cm) Weight: 60 kg (132 lb 4.4 oz) IBW/kg (Calculated) : 61.6 Heparin  Dosing Weight: 61.2 kg  Vital Signs: Temp: 98 F (36.7 C) (01/06 0730) Temp Source: Oral (01/06 0730) BP: 112/79 (01/06 0730) Pulse Rate: 78 (01/06 0749)  Labs: Recent Labs    12/13/23 0424 12/13/23 0434 12/13/23 0556 12/13/23 0640 12/13/23 1434 12/13/23 2050 12/14/23 0620  HGB 11.9* 13.3 11.4*  --   --   --  11.1*  HCT 39.6 39.0 36.7  --   --   --  36.2  PLT 469*  --  449*  --   --   --  400  LABPROT  --   --   --   --   --   --  13.2  INR  --   --   --   --   --   --  1.0  HEPARINUNFRC  --   --  <0.10*   < > 0.38 0.33 0.36  CREATININE 0.77  --   --   --   --   --  0.82  TROPONINIHS 465*  --  1,005*  --   --   --   --    < > = values in this interval not displayed.    Estimated Creatinine Clearance: 62.2 mL/min (by C-G formula based on SCr of 0.82 mg/dL).   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    Assessment: Sherry Zimmerman is a 68 y.o. year old female presented on 12/13/2023  with shob with new O2 requirement and now with concern for ACS. Troponin 465 in ED. No anticoagulation prior to admission. Pharmacy consulted to dose heparin .  Heparin  level is therapeutic at 0.36 on heparin  750 units/hr.  CBC stable.  No issues with the heparin  infusion or bleeding reported by RN.  Goal of Therapy:  Heparin  level 0.3-0.7 units/ml Monitor platelets by anticoagulation protocol: Yes   Plan:  Continue heparin  infusion at 700 units/hr Check confirmatory heparin  level in 6hr Daily heparin  level, CBC, and monitoring for bleeding F/u [facility] plans after cardiac cath tomorrow  Thank you for allowing pharmacy to participate in this patient's care.  Maurilio Fila, PharmD Clinical Pharmacist 12/14/2023  10:15 AM

## 2023-12-14 NOTE — Significant Event (Signed)
 Rapid Response Event Note   Reason for Call :  During rounds PCCM MD entering unit to see patient  Initial Focused Assessment:  Patient seen sitting upright in bed tachypneic and labored. A&O, though answers in one-two word answers. Wheezing bilaterally. Skin warm/dry.   168/89 (108) HR 121 RR 37 O2 93% [place] 5L  Interventions/Plan of Care:  See orders for inclusive list.  Bipap Lasix  CXR  Event Summary:  MD Notified: CHARM Sharps MD Call Time: 8949 Arrival Time:  End Time:   Tonna Chiquita POUR, RN

## 2023-12-14 NOTE — Progress Notes (Addendum)
 Patient Name: Sherry Zimmerman Date of Encounter: 12/14/2023 [facility] Cardiologist: None   Interval Summary  .    Denies anginal chest pain. Worsening shortness of breath, tachypnea, increased nasal cannula oxygen support, no significant improvement with breathing treatments.  Vital Signs .    Vitals:   12/14/23 0749 12/14/23 1100 12/14/23 1112 12/14/23 1123  BP:  (!) 168/89  (!) 168/89  Pulse: 78 (!) 120 (!) 110 (!) 112  Resp: 18 (!) 37 (!) 27 (!) 32  Temp:      TempSrc:      SpO2:  93% 96% 94%  Weight:      Height:        Intake/Output Summary (Last 24 hours) at 12/14/2023 1208 Last data filed at 12/14/2023 0900 Gross per 24 hour  Intake 210.55 ml  Output 700 ml  Net -489.45 ml      12/13/2023    7:44 PM 12/13/2023    2:05 PM 12/13/2023    5:50 AM  Last 3 Weights  Weight (lbs) 132 lb 4.4 oz 131 lb 6.3 oz 135 lb  Weight (kg) 60 kg 59.6 kg 61.236 kg      Telemetry/ECG    Sinus rhythm/sinus tachycardia- Personally Reviewed  Physical Exam .   GEN: Older than stated age, respiratory distress, sitting upright, hemodynamically stable Neck: No JVD Cardiac: Regular, tachycardic, Respiratory: Poor inspiratory effort, decreased breath sounds bilaterally, rhonchi's and Rales present GI: Soft, nontender, non-distended  MS: No edema  Assessment & Plan .    Acute respiratory distress NSTEMI Coronary calcification noted on nongated CT study. Aortic atherosclerosis. COPD/ emphysema - moderate to severe centrilobular and paraseptal emphysema based on recent CTA PE study.  Depression/anxiety. History of cerebellar ataxia. Former smoker  Presented with dyspnea on exertion suspected to be anginal equivalent. CT PE protocol negative for pulmonary embolism but did note coronary artery calcification. Initial EKG noted sinus rhythm with subtle ST depressions in the anterolateral leads EKG noted mild reduced LVEF with regional wall motion abnormality. Scheduled for  left heart catheterization with possible intervention later today.  However, nursing staff reached out stating that she was more short of breath with increased work of breathing. No significant change with breathing treatments. On physical examination she is sitting upright, increased oxygen supplementation, decreased breath sounds bilaterally, bilateral rhonchi's and Rales.  Stat EKG. Stat chest x-ray portable. Stat ABG. BiPAP ordered  Reached out to pulmonary medicine to help coordinate her care given her underlying COPD/emphysema and acute hypoxia respiratory distress.  Dr. Claudene aware and will come evaluate the patient.  Significant other is present at bedside.  Their questions and concerns addressed.  CRITICAL CARE Performed by: Madonna Large, DO, [facility] ([facility])   Total critical care time: 36 minutes   Critical care time was exclusive of separately billable procedures and treating other patients.   Critical care was necessary to treat or prevent imminent or life-threatening deterioration.   Critical care was time spent personally by me on the following activities: development of treatment plan with patient and/or surrogate as well as nursing, discussions with consultants, evaluation of patient's response to treatment, examination of patient, ordering and performing treatments and interventions, ordering and review of laboratory studies, ordering and review of radiographic studies, pulse oximetry and re-evaluation of patient's condition.  For questions or updates, please contact [facility] Please consult [email or web address] for contact info under     Signed, Madonna Large, DO, [facility]  [facility]  [address] [place], [place] [address] Pager: [phone] Office: [phone] 12:08 PM 12/14/23  Addendum:  EKG noted no STEMI Feels better on BiPAP.  ABG orders were held for now per CCM  CXR notes emphysema - no overt HF findings.  Greatly appreciate the help  of CCM.  Care discussed w/ patient, significant other, and RN.   Mane Consolo Brewer, DO, FACC

## 2023-12-14 NOTE — Progress Notes (Signed)
 NT called RN into room because pt stated she was having difficulty breathing. O2 89% on @LRN  assessed, RR >30, albuterol  treatment given. Pt still with increased RR and said that it did not helped. Put pt on 6L now sats >90. Resp and MD paged. EKG completed, bipap orders placed. Pulm MD requested to hold off on ABG for now. Will cont to monitor and assess

## 2023-12-14 NOTE — Progress Notes (Signed)
 12/14/2023     I have seen and evaluated the patient for resp distress.   S:  Progressive wheezing/SOB. Tripoding when I walked in room. Coached on pursed lip breathing.   O: Blood pressure 96/66, pulse 64, temperature 97.6 F (36.4 C), temperature source Oral, resp. rate 17, height 5' 3 (1.6 m), weight 81.6 kg, SpO2 94 %.    Moderate distress Bilateral wheezing with accessory muscle use Abd soft, ext warm Anxious   A:  AECOPD Type II NSTEMI Heavy tobacco abuse Suspicion for pulmonary cachexia GAD   P:  BIPAP Ativan  PRN Only need a gas if mentation changes Triple therapy nebs, steroids, doxy CXR looks benign If does not settle out on BIPAP with bring to ICU for potential intubation; if settles out TRH to take over 12/14/22   Rolan Sharps MD  Pulmonary Critical Care Prefer epic messenger for cross cover needs If after hours, please call E-link

## 2023-12-15 ENCOUNTER — Encounter (HOSPITAL_COMMUNITY)
Admission: EM | Disposition: A | Discharge status: Home / Self Care | Payer: Self-pay | Source: Home / Self Care | Attending: Internal Medicine

## 2023-12-15 ENCOUNTER — Inpatient Hospital Stay (HOSPITAL_COMMUNITY): Payer: Medicare Other

## 2023-12-15 ENCOUNTER — Ambulatory Visit: Payer: Medicare Other | Admitting: Pulmonary Disease

## 2023-12-15 DIAGNOSIS — E7841 Elevated Lipoprotein(a): Secondary | ICD-10-CM

## 2023-12-15 DIAGNOSIS — I2510 Atherosclerotic heart disease of native coronary artery without angina pectoris: Secondary | ICD-10-CM | POA: Diagnosis not present

## 2023-12-15 DIAGNOSIS — I214 Non-ST elevation (NSTEMI) myocardial infarction: Secondary | ICD-10-CM | POA: Diagnosis not present

## 2023-12-15 DIAGNOSIS — R0603 Acute respiratory distress: Secondary | ICD-10-CM | POA: Diagnosis not present

## 2023-12-15 DIAGNOSIS — R0609 Other forms of dyspnea: Secondary | ICD-10-CM | POA: Diagnosis not present

## 2023-12-15 DIAGNOSIS — I700 Atherosclerosis of aorta: Secondary | ICD-10-CM | POA: Diagnosis not present

## 2023-12-15 HISTORY — PX: LEFT HEART CATH AND CORONARY ANGIOGRAPHY: CATH118249

## 2023-12-15 HISTORY — PX: CORONARY STENT INTERVENTION: CATH118234

## 2023-12-15 LAB — BASIC METABOLIC PANEL
Anion gap: 12 (ref 5–15)
BUN: 20 mg/dL (ref 8–23)
CO2: 24 mmol/L (ref 22–32)
Calcium: 8.9 mg/dL (ref 8.9–10.3)
Chloride: 103 mmol/L (ref 98–111)
Creatinine, Ser: 0.91 mg/dL (ref 0.44–1.00)
GFR, Estimated: >60 mL/min (ref >60)
Glucose, Bld: 133 mg/dL — ABNORMAL HIGH (ref 70–99)
Potassium: 4.1 mmol/L (ref 3.5–5.1)
Sodium: 139 mmol/L (ref 135–145)

## 2023-12-15 LAB — CBC
HCT: 37 % (ref 36.0–46.0)
Hemoglobin: 11.2 g/dL — ABNORMAL LOW (ref 12.0–15.0)
MCH: 24.8 pg — ABNORMAL LOW (ref 26.0–34.0)
MCHC: 30.3 g/dL (ref 30.0–36.0)
MCV: 82 fL (ref 80.0–100.0)
Platelets: 437 10*3/uL — ABNORMAL HIGH (ref 150–400)
RBC: 4.51 MIL/uL (ref 3.87–5.11)
RDW: 17.7 % — ABNORMAL HIGH (ref 11.5–15.5)
WBC: 9.8 10*3/uL (ref 4.0–10.5)
nRBC: 0 % (ref 0.0–0.2)

## 2023-12-15 LAB — HEMOGLOBIN A1C
Hgb A1c MFr Bld: 6.5 % — ABNORMAL HIGH (ref 4.8–5.6)
Mean Plasma Glucose: 140 mg/dL

## 2023-12-15 LAB — HEPARIN LEVEL (UNFRACTIONATED): Heparin Unfractionated: 0.33 [IU]/mL (ref 0.30–0.70)

## 2023-12-15 LAB — LIPOPROTEIN A (LPA): Lipoprotein (a): 182 nmol/L — ABNORMAL HIGH (ref <75.0)

## 2023-12-15 SURGERY — LEFT HEART CATH AND CORONARY ANGIOGRAPHY
Anesthesia: LOCAL

## 2023-12-15 MED ORDER — HEPARIN SODIUM (PORCINE) 1000 UNIT/ML IJ SOLN
INTRAMUSCULAR | Status: DC | PRN
  Administered 2023-12-15: 3000 [IU] via INTRAVENOUS
  Administered 2023-12-15: 4000 [IU] via INTRAVENOUS
  Administered 2023-12-15: 3000 [IU] via INTRAVENOUS

## 2023-12-15 MED ORDER — PRASUGREL HCL 10 MG PO TABS
ORAL_TABLET | ORAL | Status: DC | PRN
  Administered 2023-12-15: 60 mg via ORAL

## 2023-12-15 MED ORDER — FUROSEMIDE 10 MG/ML IJ SOLN
INTRAMUSCULAR | Status: AC
  Filled 2023-12-15: qty 4

## 2023-12-15 MED ORDER — FUROSEMIDE 10 MG/ML IJ SOLN
20.0000 mg | Freq: Once | INTRAMUSCULAR | Status: AC
  Administered 2023-12-15: 20 mg via INTRAVENOUS
  Filled 2023-12-15: qty 2

## 2023-12-15 MED ORDER — ASPIRIN 81 MG PO CHEW
81.0000 mg | CHEWABLE_TABLET | ORAL | Status: AC
  Administered 2023-12-15: 81 mg via ORAL
  Filled 2023-12-15: qty 1

## 2023-12-15 MED ORDER — VERAPAMIL HCL 2.5 MG/ML IV SOLN
INTRAVENOUS | Status: AC
  Filled 2023-12-15: qty 2

## 2023-12-15 MED ORDER — BENZONATATE 100 MG PO CAPS
ORAL_CAPSULE | ORAL | Status: DC | PRN
  Administered 2023-12-15: 200 mg via ORAL

## 2023-12-15 MED ORDER — ALBUTEROL SULFATE (2.5 MG/3ML) 0.083% IN NEBU
INHALATION_SOLUTION | RESPIRATORY_TRACT | Status: AC
  Filled 2023-12-15: qty 3

## 2023-12-15 MED ORDER — HYDRALAZINE HCL 20 MG/ML IJ SOLN: 10.0000 mg | INTRAMUSCULAR | Status: AC | PRN

## 2023-12-15 MED ORDER — LIDOCAINE HCL (PF) 1 % IJ SOLN
INTRAMUSCULAR | Status: AC
  Filled 2023-12-15: qty 30

## 2023-12-15 MED ORDER — SODIUM CHLORIDE 0.9% FLUSH: 3.0000 mL | INTRAVENOUS | Status: DC | PRN

## 2023-12-15 MED ORDER — IOHEXOL 350 MG/ML SOLN
INTRAVENOUS | Status: DC | PRN
  Administered 2023-12-15: 80 mL

## 2023-12-15 MED ORDER — HEPARIN (PORCINE) IN NACL 1000-0.9 UT/500ML-% IV SOLN
INTRAVENOUS | Status: DC | PRN
  Administered 2023-12-15 (×2): 500 mL

## 2023-12-15 MED ORDER — FUROSEMIDE 10 MG/ML IJ SOLN
20.0000 mg | Freq: Once | INTRAMUSCULAR | Status: AC
  Administered 2023-12-15: 20 mg via INTRAVENOUS

## 2023-12-15 MED ORDER — SODIUM CHLORIDE 0.9 % IV SOLN: 250.0000 mL | INTRAVENOUS | Status: AC | PRN

## 2023-12-15 MED ORDER — FENTANYL CITRATE (PF) 100 MCG/2ML IJ SOLN
INTRAMUSCULAR | Status: AC
  Filled 2023-12-15: qty 2

## 2023-12-15 MED ORDER — MIDAZOLAM HCL 2 MG/2ML IJ SOLN
INTRAMUSCULAR | Status: AC
  Filled 2023-12-15: qty 2

## 2023-12-15 MED ORDER — PRASUGREL HCL 10 MG PO TABS
10.0000 mg | ORAL_TABLET | Freq: Every day | ORAL | Status: DC
  Administered 2023-12-16 – 2023-12-17 (×2): 10 mg via ORAL
  Filled 2023-12-15 (×2): qty 1

## 2023-12-15 MED ORDER — ACETAMINOPHEN 325 MG PO TABS
ORAL_TABLET | ORAL | Status: AC
  Administered 2023-12-15: 650 mg via ORAL
  Filled 2023-12-15: qty 2

## 2023-12-15 MED ORDER — SODIUM CHLORIDE 0.9% FLUSH
3.0000 mL | Freq: Two times a day (BID) | INTRAVENOUS | Status: DC
  Administered 2023-12-15 – 2023-12-16 (×3): 3 mL via INTRAVENOUS

## 2023-12-15 MED ORDER — SODIUM CHLORIDE 0.9 % IV SOLN: INTRAVENOUS | Status: DC

## 2023-12-15 MED ORDER — PRASUGREL HCL 10 MG PO TABS
ORAL_TABLET | ORAL | Status: AC
  Filled 2023-12-15: qty 6

## 2023-12-15 MED ORDER — MIDAZOLAM HCL 2 MG/2ML IJ SOLN
INTRAMUSCULAR | Status: DC | PRN
  Administered 2023-12-15 (×2): .5 mg via INTRAVENOUS

## 2023-12-15 MED ORDER — PREDNISONE 20 MG PO TABS
40.0000 mg | ORAL_TABLET | Freq: Every day | ORAL | Status: DC
  Administered 2023-12-16 – 2023-12-17 (×2): 40 mg via ORAL
  Filled 2023-12-15 (×2): qty 2

## 2023-12-15 MED ORDER — SODIUM CHLORIDE 0.9 % IV SOLN: INTRAVENOUS | Status: AC

## 2023-12-15 MED ORDER — HEPARIN SODIUM (PORCINE) 1000 UNIT/ML IJ SOLN
INTRAMUSCULAR | Status: AC
  Filled 2023-12-15: qty 10

## 2023-12-15 MED ORDER — LIDOCAINE HCL (PF) 1 % IJ SOLN
INTRAMUSCULAR | Status: DC | PRN
  Administered 2023-12-15 (×2): 2 mL

## 2023-12-15 MED ORDER — VERAPAMIL HCL 2.5 MG/ML IV SOLN
INTRAVENOUS | Status: DC | PRN
  Administered 2023-12-15: 10 mL via INTRA_ARTERIAL

## 2023-12-15 MED ORDER — LABETALOL HCL 5 MG/ML IV SOLN: 10.0000 mg | INTRAVENOUS | Status: AC | PRN

## 2023-12-15 SURGICAL SUPPLY — 15 items
BALLN EMERGE MR 2.5X12 (BALLOONS) ×1
BALLOON EMERGE MR 2.5X12 (BALLOONS) IMPLANT
CATH INFINITI AMBI 5FR TG (CATHETERS) IMPLANT
CATHETER LAUNCHER 6FR JR4 SH (CATHETERS) IMPLANT
DEVICE RAD COMP TR BAND LRG (VASCULAR PRODUCTS) IMPLANT
GLIDESHEATH SLEND SS 6F .021 (SHEATH) IMPLANT
GUIDEWIRE INQWIRE 1.5J.035X260 (WIRE) IMPLANT
INQWIRE 1.5J .035X260CM (WIRE) ×1
KIT ENCORE 26 ADVANTAGE (KITS) IMPLANT
PACK CARDIAC CATHETERIZATION (CUSTOM PROCEDURE TRAY) ×1 IMPLANT
SET ATX-X65L (MISCELLANEOUS) IMPLANT
SHEATH PROBE COVER 6X72 (BAG) IMPLANT
STENT SYNERGY XD 3.50X20 (Permanent Stent) IMPLANT
SYNERGY XD 3.50X20 (Permanent Stent) ×1 IMPLANT
WIRE ASAHI PROWATER 180CM (WIRE) IMPLANT

## 2023-12-15 NOTE — Progress Notes (Signed)
 Pt had diminished breath sounds with expiratory wheezing and non-productive cough.  Pt reports feeling mildly SOB.  Administered breathing treatment PRN.  TR band noted to have continual scant oozing.  Pt unable to maintain arm in recovery position d/t coughing fits.  Notified Anner, MD.  Received verbal order to stop fluids and administer one time dose of Lasix  ordered for 1700.  Will continue to monitor.

## 2023-12-15 NOTE — Progress Notes (Addendum)
 Patient Name: Sherry Zimmerman Date of Encounter: 12/15/2023 [facility] Cardiologist: None   Interval Summary  .    Patient in no acute distress. Reports significant improvement in her breathing today compared with yesterday though does admit she is still more short of breath than what she considers baseline. She denies chest pain or palpitations.   Vital Signs .    Vitals:   12/15/23 0500 12/15/23 0557 12/15/23 0906 12/15/23 1204  BP: 119/73  100/74   Pulse: 89  93   Resp: 19  18   Temp:      TempSrc:      SpO2: (!) 89%  93% 93%  Weight:  58.4 kg    Height:        Intake/Output Summary (Last 24 hours) at 12/15/2023 1245 Last data filed at 12/15/2023 0651 Gross per 24 hour  Intake 1245.9 ml  Output 1550 ml  Net -304.1 ml      12/15/2023    5:57 AM 12/13/2023    7:44 PM 12/13/2023    2:05 PM  Last 3 Weights  Weight (lbs) 128 lb 12 oz 132 lb 4.4 oz 131 lb 6.3 oz  Weight (kg) 58.4 kg 60 kg 59.6 kg      Telemetry/ECG    Sinus rhythm - Personally Reviewed  Physical Exam .   GEN: No acute distress.   Neck: No JVD Cardiac: RRR, no murmurs, rubs, or gallops.  Respiratory: Generally distant breath sounds. Faint end-inspiratory wheezing left lung.  GI: Soft, nontender, non-distended  MS: No edema  Assessment & Plan .    Sherry Zimmerman is a 68 y.o. female with COPD, former tobacco use (quit in October 2024), depression/anxiety, cerebellar ataxia, IDA who is being seen for the evaluation of acute worsening of her shortness of breath with NSTEMI.    NSTEMI  Patient admitted with acute on chronic dyspnea, no chest pain. She underwent CTA chest PE protocol which was negative for PE but did note coronary artery calcifications. EKG also observed to have nonspecific ST depressions in anterolateral leads concerning for ischemia. Troponin 465->1005. Dyspnea concerning for anginal equivalent. Given troponin elevation, patient started on heparin , high intensity statin and  continued on antiplatelet regimen. TTE with LVEF 40-45%. Echo also notable for anterior septum, mid inferolateral segment, mid inferoseptal segment, mid inferior segment, and basal inferoseptal segment hypokinesis. Given these RWMA, plans made for ischemic evaluation. On 1/6, patient noted with increasing WOB, tripod position on exam and LHC delayed to allow pulmonary care. Today 1/7, patient with notable improvement in respiratory status. On 3LPM is maintaining O2 saturation > 94%.   Given significantly improved respiratory status, plan for LHC today.  Continue heparin , ASA, Atorvastatin , Metoprolol  Continue PRN nitroglycerin  Titrate GDMT per LHC  HFmrEF  TTE this admission with LVEF 40-45%. She is currently receiving Metoprolol  Tartrate 12.5mg  BID with LHC pending.   Will plan to optimize GDMT, including initiation of ARB/ARNI, MRA, SGLT2 following LHC as BP and renal function allows. BP has been lower for most of this admission.    Acute hypoxic respiratory failure COPD  Patient with worsening respiratory status requiring temporary BiPAP support on 1/6. Is breathing much better toda, O2 stable >94% on 3LPM. Appreciate the assistance of PCCM team yesterday and [facility] taking over as primary team today. Will defer further management of lung disease to them.   For questions or updates, please contact [facility] Please consult [email or web address] for contact info under  Signed, Artist Pouch, PA-C   ADDENDUM:   Patient seen and examined with APP, Artist Pouch.  I personally taken a history, examined the patient, reviewed relevant notes,  laboratory data / imaging studies.  I performed a substantive portion of this encounter and formulated the important aspects of the plan.  I agree with the APP's note, impression, and recommendations; however, I have edited the note to reflect changes or salient points (which are noted in italics).   Patient denies anginal  chest pain. Shortness of breath is improved significantly. No longer on BiPAP, currently on nasal oxygen.  Able to lay flat  PHYSICAL EXAM: Temp:  [98.2 F (36.8 C)-98.5 F (36.9 C)] 98.2 F (36.8 C) (01/07 0400) Pulse Rate:  [81-108] 93 (01/07 0906) Cardiac Rhythm: Normal sinus rhythm (01/07 0700) Resp:  [15-30] 18 (01/07 0906) BP: (100-119)/(72-81) 100/74 (01/07 0906) SpO2:  [89 %-98 %] 93 % (01/07 1204) FiO2 (%):  [36 %-40 %] 36 % (01/07 0733) Weight:  [58.4 kg] 58.4 kg (01/07 0557)  Body mass index is 20.16 kg/m.   Net IO Since Admission: -793.55 mL [12/15/23 1245]  Filed Weights   12/13/23 1405 12/13/23 1944 12/15/23 0557  Weight: 59.6 kg 60 kg 58.4 kg    General: Appears older than stated age, hemodynamically stable, no acute distress, currently on nasal cannula oxygen. Neck: No JVP. Cardiac: Regular, positive S1-S2, no murmurs rubs or gallops appreciated. Lungs: Currently on nasal cannula oxygen, decreased breath sounds bilaterally, no rales or rhonchi's. GI: Soft, nontender, nondistended, positive bowel sounds in all 4 quadrants. Lower extremities: No edema  EKG: (personally reviewed by me) 12/14/2023: Sinus tachycardia, 109 bpm, nonspecific T wave abnormality  Telemetry: (personally reviewed by me) Sinus rhythm without ectopy   Impression:  NSTEMI. Coronary artery calcification on nongated CT study. Aortic atherosclerosis. Elevated LP(a). COPD/emphysema. Former smoker  Recommendations:  NSTEMI: Coronary artery calcification: Aortic atherosclerosis: Patient presented with predominantly dyspnea which was progressive and not her usual COPD symptoms.  It was felt that the dyspnea was likely anginal equivalent. Her high sensitive troponins continue to trend up. Echocardiogram noted mildly reduced LVEF with regional wall motion abnormalities. We discussed ischemic workup including coronary CTA, stress test, and left heart catheterization with possible  intervention.  Given the progressive symptoms and risk factors patient wished to proceed with invasive angiography with possible intervention. Procedure had to be rescheduled for later today due to acute exacerbation of COPD yesterday requiring BiPAP support and parenteral medications. No questions or concerns with regards to upcoming angiography. Further recommendations to follow.  Elevated LP(a): Continue statin therapy. Calculated LDL currently 64 mg/dL.  COPD/emphysema: Currently being followed by pulmonary medicine, appreciate their recommendations and follow with her.  Further recommendations to follow as the case evolves.   This note was created using a voice recognition software as a result there may be grammatical errors inadvertently enclosed that do not reflect the nature of this encounter. Every attempt is made to correct such errors.   Madonna Large, DO, [facility]  [address] [place], [place] [address] Pager: [phone] Office: [phone] 12/15/2023 12:45 PM

## 2023-12-15 NOTE — Progress Notes (Signed)
 Patient educated on maintaining proper arm position while TR band is in place.

## 2023-12-15 NOTE — Progress Notes (Signed)
 Cath lab nurse called to assess Right radial site.

## 2023-12-15 NOTE — Hospital Course (Signed)
 Sherry Zimmerman is a 68 y.o. female with a history of COPD, prior tobacco use, depression, anxiety, cerebellar ataxia, iron deficiency anemia.  Patient presented secondary to worsening shortness of breath with concern for NSTEMI on admission.  Troponin elevation of [phone].  Patient was admitted by cardiology and started on heparin  drip.  Patient was also noted to have evidence of a COPD exacerbation with worsening respiratory function.  Pulmonology was consulted and patient placed on BiPAP.  Treatment for COPD exacerbation initiated with improvement in respiratory symptoms.  Cardiology plan for heart catheterization.

## 2023-12-15 NOTE — Plan of Care (Signed)
   Problem: Activity: Goal: Risk for activity intolerance will decrease Outcome: Progressing   Problem: Nutrition: Goal: Adequate nutrition will be maintained Outcome: Progressing   Problem: Coping: Goal: Level of anxiety will decrease Outcome: Progressing   Problem: Elimination: Goal: Will not experience complications related to bowel motility Outcome: Progressing Goal: Will not experience complications related to urinary retention Outcome: Progressing

## 2023-12-15 NOTE — Progress Notes (Signed)
 Patient re-educated on trying to maintain proper arm position while TR band is in place. Air added again to TR band due to scant amount of continual oozing.

## 2023-12-15 NOTE — Progress Notes (Signed)
 12/15/2023     I have seen and evaluated the patient for AECOPD.   S:  Doing better Wore bipap last night For cath today   O: Blood pressure 96/66, pulse 64, temperature 97.6 F (36.4 C), temperature source Oral, resp. rate 17, height 5' 3 (1.6 m), weight 81.6 kg, SpO2 94 %.    No distress Wheezing improved Moves to command Still needs about 4LPM O2  A:  AECOPD Type II NSTEMI- for cath today Heavy tobacco abuse Suspicion for pulmonary cachexia GAD   P:  BIPAP to PRN Ativan  PRN Only need a gas if mentation changes Triple therapy nebs, steroids, doxy Walking O2 eval prior to DC Steroids to PO Okay to go home with bevespi , will also request nebulizer for home for PRN albuterol  Will set up for f/u in office in 4 weeks, call if questions/concerns   Rolan Sharps MD [facility] Prefer epic messenger for cross cover needs If after hours, please call E-link

## 2023-12-15 NOTE — Interval H&P Note (Signed)
 History and Physical Interval Note:  12/15/2023 12:10 PM  Sherry Zimmerman  has presented today for surgery, with the diagnosis of NSTEMI.    The various methods of treatment have been discussed with the patient and family. After consideration of risks, benefits and other options for treatment, the patient has consented to  Procedure(s): LEFT HEART CATH AND CORONARY ANGIOGRAPHY (N/A)  PERCUTANEOUS CORONARY INTERVENTION  as a surgical intervention.  The patient's history has been reviewed, patient examined, no change in status, stable for surgery.  I have reviewed the patient's chart and labs.  Questions were answered to the patient's satisfaction.    Cath Lab Visit (complete for each Cath Lab visit)  Clinical Evaluation Leading to the Procedure:   ACS: Yes.    Non-ACS:    Anginal Classification: CCS IV  Anti-ischemic medical therapy: Minimal Therapy (1 class of medications)  Non-Invasive Test Results: Equivocal test results; + reduced EF by Echo with RWMA  Prior CABG: No previous CABG      Alm Clay

## 2023-12-15 NOTE — Telephone Encounter (Signed)
 HFU scheduled for 2/6 with Tammy. Letter mailed. Nothing further needed.

## 2023-12-15 NOTE — TOC Initial Note (Addendum)
 Transition of Care [facility]) - Initial/Assessment Note    Patient Details  Name: Sherry Zimmerman MRN: [id number] Date of Birth: November 29, 1956  [facility] CM/SW Contact:    Waddell Barnie Rama, RN Phone Number: 12/15/2023, 10:28 AM  Clinical Narrative:                 From home with SO, has PCP and insurance on file, states has no HH services in place at this time , has rollator at home.  States SO will transport them home at dc and SO is support system, states gets medications from [facility] on [address].  Pta self ambulatory rollator.  She will need neb machine, she has no preference of DME agency.  NCM made referral to Jermaine with [employer],  The neb machine will be brought up to the room prior to dc.  Expected Discharge Plan: Home/Self Care Barriers to Discharge: Continued Medical Work up   Patient Goals and CMS Choice Patient states their goals for this hospitalization and ongoing recovery are:: return home   Choice offered to / list presented to : NA      Expected Discharge Plan and Services In-house Referral: NA Discharge Planning Services: CM Consult Post Acute Care Choice: NA Living arrangements for the past 2 months: Single Family Home                 DME Arranged: Nebulizer machine DME Agency: [employer] Date DME Agency Contacted: 12/15/23 Time DME Agency Contacted: 1028 Representative spoke with at DME Agency: [place] HH Arranged: NA          Prior Living Arrangements/Services Living arrangements for the past 2 months: Single Family Home Lives with:: Significant Other Patient language and need for interpreter reviewed:: Yes Do you feel safe going back to the place where you live?: Yes      Need for Family Participation in Patient Care: Yes (Comment) Care giver support system in place?: Yes (comment) Current home services: DME (rollator) Criminal Activity/Legal Involvement Pertinent to Current Situation/Hospitalization: No - Comment as  needed  Activities of Daily Living   ADL Screening (condition at time of admission) Independently performs ADLs?: Yes (appropriate for developmental age) Is the patient deaf or have difficulty hearing?: No Does the patient have difficulty seeing, even when wearing glasses/contacts?: No Does the patient have difficulty concentrating, remembering, or making decisions?: No  Permission Sought/Granted Permission sought to share information with : Case Manager Permission granted to share information with : Yes, Verbal Permission Granted              Emotional Assessment Appearance:: Appears stated age Attitude/Demeanor/Rapport: Engaged Affect (typically observed): Appropriate Orientation: : Oriented to Self, Oriented to Place, Oriented to  Time, Oriented to Situation Alcohol / Substance Use: Not Applicable Psych Involvement: No (comment)  Admission diagnosis:  NSTEMI (non-ST elevated myocardial infarction) [facility]) [I21.4] Patient Active Problem List   Diagnosis Date Noted   Acute respiratory distress 12/14/2023   NSTEMI (non-ST elevated myocardial infarction) (HCC) 12/13/2023   Dyspnea on exertion 12/13/2023   Coronary atherosclerosis due to calcified coronary lesion 12/13/2023   Atherosclerosis of aorta (HCC) 12/13/2023   Prolonged QT interval 12/13/2023   Anxiety 12/13/2023   Depression 12/13/2023   Cerebellar ataxia (HCC) 12/13/2023   COPD with acute exacerbation (HCC) 11/14/2023   COVID-19 06/29/2023   FTT (failure to thrive) in adult 06/29/2023   Generalized weakness 06/29/2023   Osteoporosis 07/08/2021   Former smoker 07/08/2021   On statin  therapy due to risk of future cardiovascular event 11/18/2019   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   Migraine without aura and without status migrainosus, not intractable 01/10/2019   Major depression, recurrent, chronic (HCC) 08/13/2018   Acquired iron deficiency anemia due to decreased absorption 06/18/2018   Raynaud  disease 05/13/2018   Emphysema of lung (HCC) 05/13/2018   AR (allergic rhinitis) 01/08/2018   OAB (overactive bladder) 11/09/2014   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   PCP:  Jodie Lavern CROME, MD Pharmacy:   [facility] [address], [place] - [address] [place] [address] Phone: [phone] Fax: [phone]     Social Drivers of Health (SDOH) Social History: SDOH Screenings   Food Insecurity: No Food Insecurity (12/13/2023)  Housing: Low Risk  (12/13/2023)  Transportation Needs: No Transportation Needs (12/13/2023)  Utilities: Not At Risk (12/13/2023)  Alcohol Screen: Low Risk  (10/01/2023)  Depression (PHQ2-9): Low Risk  (08/25/2023)  Financial Resource Strain: Low Risk  (10/01/2023)  Physical Activity: Inactive (10/01/2023)  Social Connections: Moderately Integrated (12/13/2023)  Stress: No Stress Concern Present (10/01/2023)  Tobacco Use: Medium Risk (12/13/2023)  Health Literacy: Adequate Health Literacy (08/25/2023)   SDOH Interventions:     Readmission Risk Interventions    07/01/2023    2:32 PM  Readmission Risk Prevention Plan  Post Dischage Appt Complete  Medication Screening Complete  Transportation Screening Complete

## 2023-12-15 NOTE — Progress Notes (Signed)
 PROGRESS NOTE    Sherry Zimmerman  FMW:[id number] DOB: June 20, 1956 DOA: 12/13/2023 PCP: Jodie Lavern LITTIE, MD   Brief Narrative: Sherry Zimmerman is a 68 y.o. female with a history of COPD, prior tobacco use, depression, anxiety, cerebellar ataxia, iron deficiency anemia.  Patient presented secondary to worsening shortness of breath with concern for NSTEMI on admission.  Troponin elevation of [phone].  Patient was admitted by cardiology and started on heparin  drip.  Patient was also noted to have evidence of a COPD exacerbation with worsening respiratory function.  Pulmonology was consulted and patient placed on BiPAP.  Treatment for COPD exacerbation initiated with improvement in respiratory symptoms.  Cardiology plan for heart catheterization.   Assessment and Plan:  COPD exacerbation Patient is on albuterol  and Bevespi  as an outpatient. Unclear trigger. Patient started on Solu-medrol , doxycycline . -Continue doxycycline , albuterol , prednisone  -Continue brovana  and Yupelri   Acute respiratory failure with hypoxia Secondary to COPD exacerbation. Pulmonology consulted and patient managed by treatment of COPD in addition to BiPAP with improvement. Avoided need for transfer to ICU. Patient is on 3 L/min today. -Wean to room air as able -Ambulatory pulse ox prior to discharge  NSTEMI Patient admitted by cardiology. Troponin elevation of 465 on admission with increase to 1,005 without known peak. Cardiology started heparin  IV and metoprolol .  Echocardiogram performed and was significant for an LVEF of 40 to 45% with regional wall motion abnormality noted. Plan heart catheterization on 1/7. -Continue heparin  IV and metoprolol   History of tobacco abuse Patient with smoking cessation on stop smoking in October 2024  GAD -Continue Effexor   Overactive bladder -Continue oxybutynin   Vitamin B12 deficiency -Continue Vitamin B12 supplementation  Hyperlipidemia -Continue Lipitor    DVT  prophylaxis: Heparin  IV Code Status:   Code Status: Full Code Family Communication: None at bedside Disposition Plan: Discharge home likely in 1 to 2 days pending ability to wean off oxygen if able in addition to cardiology recommendations   Consultants:  Cardiology PCCM  Procedures:  1/5: Transthoracic echocardiogram  Antimicrobials: Doxycycline    Subjective: Patient reports feeling much better than yesterday.  Breathing is improved.  Objective: BP 100/74   Pulse 93   Temp 98.2 F (36.8 C) (Axillary)   Resp 18   Ht 5' 7 (1.702 m)   Wt 58.4 kg   SpO2 93%   BMI 20.16 kg/m   Examination:  General exam: Appears calm and comfortable Respiratory system: Coarse breath sounds. Respiratory effort normal. Cardiovascular system: S1 & S2 heard, RRR. Gastrointestinal system: Abdomen is nondistended, soft and nontender. Normal bowel sounds heard. Central nervous system: Alert and oriented. No focal neurological deficits. Musculoskeletal: No edema. No calf tenderness Skin: No cyanosis. No rashes Psychiatry: Judgement and insight appear normal. Mood & affect appropriate.    Data Reviewed: I have personally reviewed following labs and imaging studies  CBC Lab Results  Component Value Date   WBC 9.8 12/15/2023   RBC 4.51 12/15/2023   HGB 11.2 (L) 12/15/2023   HCT 37.0 12/15/2023   MCV 82.0 12/15/2023   MCH 24.8 (L) 12/15/2023   PLT 437 (H) 12/15/2023   MCHC 30.3 12/15/2023   RDW 17.7 (H) 12/15/2023   LYMPHSABS 1.6 12/13/2023   MONOABS 1.0 12/13/2023   EOSABS 0.2 12/13/2023   BASOSABS 0.1 12/13/2023     Last metabolic panel Lab Results  Component Value Date   NA 139 12/15/2023   K 4.1 12/15/2023   CL 103 12/15/2023   CO2 24 12/15/2023   BUN 20  12/15/2023   CREATININE 0.91 12/15/2023   GLUCOSE 133 (H) 12/15/2023   GFRNONAA >60 12/15/2023   GFRAA >60 06/18/2018   CALCIUM  8.9 12/15/2023   PROT 7.3 11/13/2023   ALBUMIN 3.7 11/13/2023   BILITOT 0.6 11/13/2023    ALKPHOS 94 11/13/2023   AST 19 11/13/2023   ALT 13 11/13/2023   ANIONGAP 12 12/15/2023    GFR: Estimated Creatinine Clearance: 54.5 mL/min (by C-G formula based on SCr of 0.91 mg/dL).  Recent Results (from the past 240 hours)  Resp panel by RT-PCR (RSV, Flu A&B, Covid) Anterior Nasal Swab     Status: None   Collection Time: 12/13/23  4:24 AM   Specimen: Anterior Nasal Swab  Result Value Ref Range Status   SARS Coronavirus 2 by RT PCR NEGATIVE NEGATIVE Final   Influenza A by PCR NEGATIVE NEGATIVE Final   Influenza B by PCR NEGATIVE NEGATIVE Final    Comment: (NOTE) The Xpert Xpress SARS-CoV-2/FLU/RSV plus assay is intended as an aid in the diagnosis of influenza from Nasopharyngeal swab specimens and should not be used as a sole basis for treatment. Nasal washings and aspirates are unacceptable for Xpert Xpress SARS-CoV-2/FLU/RSV testing.  Fact Sheet for Patients: bloggercourse.com  Fact Sheet for Healthcare Providers: seriousbroker.it  This test is not yet approved or cleared by the [place]  FDA and has been authorized for detection and/or diagnosis of SARS-CoV-2 by FDA under an Emergency Use Authorization (EUA). This EUA will remain in effect (meaning this test can be used) for the duration of the COVID-19 declaration under Section 564(b)(1) of the Act, 21 U.S.C. section 360bbb-3(b)(1), unless the authorization is terminated or revoked.     Resp Syncytial Virus by PCR NEGATIVE NEGATIVE Final    Comment: (NOTE) Fact Sheet for Patients: bloggercourse.com  Fact Sheet for Healthcare Providers: seriousbroker.it  This test is not yet approved or cleared by the [place]  FDA and has been authorized for detection and/or diagnosis of SARS-CoV-2 by FDA under an Emergency Use Authorization (EUA). This EUA will remain in effect (meaning this test can be used) for the  duration of the COVID-19 declaration under Section 564(b)(1) of the Act, 21 U.S.C. section 360bbb-3(b)(1), unless the authorization is terminated or revoked.  Performed at [facility], [address] [place], [place] [address]       Radiology Studies: DG CHEST PORT 1 VIEW Result Date: 12/14/2023 CLINICAL DATA:  Breathing difficulty/dyspnea EXAM: PORTABLE CHEST 1 VIEW COMPARISON:  12/13/2023 FINDINGS: Cardiac and mediastinal margins appear normal. Emphysema noted. Mild scarring or atelectasis in the left lower lobe. Mild spurring of the humeral heads bilaterally. No blunting of the costophrenic angles. IMPRESSION: 1.  Emphysema (ICD10-J43.9). 2. Mild scarring or atelectasis in the left lower lobe. Electronically Signed   By: Ryan Salvage M.D.   On: 12/14/2023 12:03   ECHOCARDIOGRAM COMPLETE Result Date: 12/13/2023    ECHOCARDIOGRAM REPORT   Patient Name:   CESAR ALF Date of Exam: 12/13/2023 Medical Rec #:  [id number]         Height:       67.0 in Accession #:    [id number]        Weight:       135.0 lb Date of Birth:  January 30, 1956         BSA:          1.711 m Patient Age:    67 years          BP:  121/81 mmHg Patient Gender: F                 HR:           90 bpm. Exam Location:  Inpatient Procedure: 2D Echo, Cardiac Doppler, Color Doppler, 3D Echo and Strain Analysis Indications:    R06.02 SOB  History:        Patient has no prior history of Echocardiogram examinations.                 COPD; Signs/Symptoms:Shortness of Breath.  Sonographer:    Logan Shove Referring Phys: [id number] DENNIS NARCISSE JR IMPRESSIONS  1. Left ventricular ejection fraction, by estimation, is 40 to 45%. Left ventricular ejection fraction by 3D volume is 46 %. The left ventricle has mildly decreased function. The left ventricle demonstrates regional wall motion abnormalities (see scoring diagram/findings for description). Left ventricular diastolic parameters are indeterminate.  2. Right ventricular  systolic function is normal. The right ventricular size is normal.  3. The mitral valve is grossly normal. No evidence of mitral valve regurgitation. No evidence of mitral stenosis.  4. The aortic valve was not well visualized. Aortic valve regurgitation is not visualized. No aortic stenosis is present.  5. The inferior vena cava is normal in size with greater than 50% respiratory variability, suggesting right atrial pressure of 3 mmHg. Comparison(s): No prior Echocardiogram. FINDINGS  Left Ventricle: Left ventricular ejection fraction, by estimation, is 40 to 45%. Left ventricular ejection fraction by 3D volume is 46 %. The left ventricle has mildly decreased function. The left ventricle demonstrates regional wall motion abnormalities. The global longitudinal strain is normal despite suboptimal segment tracking. The left ventricular internal cavity size was normal in size. There is no left ventricular hypertrophy. Left ventricular diastolic parameters are indeterminate.  LV Wall Scoring: The anterior septum, mid inferolateral segment, mid inferoseptal segment, mid inferior segment, and basal inferoseptal segment are hypokinetic. Right Ventricle: The right ventricular size is normal. No increase in right ventricular wall thickness. Right ventricular systolic function is normal. Left Atrium: Left atrial size was normal in size. Right Atrium: Right atrial size was normal in size. Pericardium: There is no evidence of pericardial effusion. Mitral Valve: The mitral valve is grossly normal. No evidence of mitral valve regurgitation. No evidence of mitral valve stenosis. Tricuspid Valve: The tricuspid valve is normal in structure. Tricuspid valve regurgitation is not demonstrated. No evidence of tricuspid stenosis. Aortic Valve: The aortic valve was not well visualized. Aortic valve regurgitation is not visualized. No aortic stenosis is present. Aortic valve mean gradient measures 3.0 mmHg. Aortic valve peak gradient  measures 5.7 mmHg. Aortic valve area, by VTI measures 2.75 cm. Pulmonic Valve: The pulmonic valve was not well visualized. Pulmonic valve regurgitation is not visualized. No evidence of pulmonic stenosis. Aorta: The aortic root is normal in size and structure. Venous: The inferior vena cava is normal in size with greater than 50% respiratory variability, suggesting right atrial pressure of 3 mmHg. IAS/Shunts: The interatrial septum was not well visualized.  LEFT VENTRICLE PLAX 2D LVIDd:         5.00 cm         Diastology LVIDs:         4.10 cm         LV e' medial:    5.98 cm/s LV PW:         0.90 cm         LV E/e' medial:  10.7 LV IVS:  0.70 cm         LV e' lateral:   6.85 cm/s LVOT diam:     2.00 cm         LV E/e' lateral: 9.3 LV SV:         62 LV SV Index:   36 LVOT Area:     3.14 cm        3D Volume EF                                LV 3D EF:    Left                                             ventricul                                             ar                                             ejection                                             fraction                                             by 3D                                             volume is                                             46 %.                                 3D Volume EF:                                3D EF:        46 %                                LV EDV:       118 ml                                LV ESV:       64 ml  LV SV:        54 ml RIGHT VENTRICLE             IVC RV Basal diam:  3.30 cm     IVC diam: 1.70 cm RV Mid diam:    1.70 cm RV S prime:     15.20 cm/s TAPSE (M-mode): 1.8 cm LEFT ATRIUM             Index        RIGHT ATRIUM           Index LA diam:        3.60 cm 2.10 cm/m   RA Area:     12.50 cm LA Vol (A2C):   31.3 ml 18.29 ml/m  RA Volume:   27.90 ml  16.30 ml/m LA Vol (A4C):   25.1 ml 14.67 ml/m LA Biplane Vol: 29.6 ml 17.30 ml/m  AORTIC VALVE AV Area (Vmax):    3.01  cm AV Area (Vmean):   2.87 cm AV Area (VTI):     2.75 cm AV Vmax:           119.00 cm/s AV Vmean:          82.100 cm/s AV VTI:            0.224 m AV Peak Grad:      5.7 mmHg AV Mean Grad:      3.0 mmHg LVOT Vmax:         114.00 cm/s LVOT Vmean:        74.900 cm/s LVOT VTI:          0.196 m LVOT/AV VTI ratio: 0.88  AORTA Ao Root diam: 3.30 cm MITRAL VALVE MV Area (PHT): 3.65 cm    SHUNTS MV Decel Time: 208 msec    Systemic VTI:  0.20 m MV E velocity: 63.90 cm/s  Systemic Diam: 2.00 cm MV A velocity: 82.80 cm/s MV E/A ratio:  0.77 Sunit Tolia Electronically signed by Madonna Large Signature Date/Time: 12/13/2023/6:05:05 PM    Final       LOS: 2 days    Elgin Lam, MD [facility] 12/15/2023, 12:50 PM   If 7PM-7AM, please contact night-coverage [email or web address]

## 2023-12-16 ENCOUNTER — Encounter: Payer: Self-pay | Admitting: Family Medicine

## 2023-12-16 ENCOUNTER — Encounter (HOSPITAL_COMMUNITY): Payer: Self-pay | Admitting: Cardiology

## 2023-12-16 ENCOUNTER — Telehealth: Payer: Self-pay | Admitting: Cardiology

## 2023-12-16 DIAGNOSIS — I2510 Atherosclerotic heart disease of native coronary artery without angina pectoris: Secondary | ICD-10-CM

## 2023-12-16 DIAGNOSIS — Z955 Presence of coronary angioplasty implant and graft: Secondary | ICD-10-CM

## 2023-12-16 DIAGNOSIS — R0603 Acute respiratory distress: Secondary | ICD-10-CM | POA: Diagnosis not present

## 2023-12-16 DIAGNOSIS — I214 Non-ST elevation (NSTEMI) myocardial infarction: Secondary | ICD-10-CM

## 2023-12-16 LAB — CBC
HCT: 37.6 % (ref 36.0–46.0)
Hemoglobin: 11.6 g/dL — ABNORMAL LOW (ref 12.0–15.0)
MCH: 25.6 pg — ABNORMAL LOW (ref 26.0–34.0)
MCHC: 30.9 g/dL (ref 30.0–36.0)
MCV: 82.8 fL (ref 80.0–100.0)
Platelets: 418 10*3/uL — ABNORMAL HIGH (ref 150–400)
RBC: 4.54 MIL/uL (ref 3.87–5.11)
RDW: 17.6 % — ABNORMAL HIGH (ref 11.5–15.5)
WBC: 20 10*3/uL — ABNORMAL HIGH (ref 4.0–10.5)
nRBC: 0 % (ref 0.0–0.2)

## 2023-12-16 LAB — BASIC METABOLIC PANEL
Anion gap: 14 (ref 5–15)
BUN: 34 mg/dL — ABNORMAL HIGH (ref 8–23)
CO2: 23 mmol/L (ref 22–32)
Calcium: 8.9 mg/dL (ref 8.9–10.3)
Chloride: 99 mmol/L (ref 98–111)
Creatinine, Ser: 1 mg/dL (ref 0.44–1.00)
GFR, Estimated: >60 mL/min (ref >60)
Glucose, Bld: 98 mg/dL (ref 70–99)
Potassium: 4 mmol/L (ref 3.5–5.1)
Sodium: 136 mmol/L (ref 135–145)

## 2023-12-16 LAB — POCT ACTIVATED CLOTTING TIME
Activated Clotting Time: 222 s
Activated Clotting Time: 337 s

## 2023-12-16 MED ORDER — METOPROLOL SUCCINATE ER 25 MG PO TB24
25.0000 mg | ORAL_TABLET | Freq: Every day | ORAL | Status: DC
  Administered 2023-12-16 – 2023-12-17 (×2): 25 mg via ORAL
  Filled 2023-12-16 (×2): qty 1

## 2023-12-16 MED ORDER — ENOXAPARIN SODIUM 40 MG/0.4ML IJ SOSY
40.0000 mg | PREFILLED_SYRINGE | INTRAMUSCULAR | Status: DC
  Administered 2023-12-16 – 2023-12-17 (×2): 40 mg via SUBCUTANEOUS
  Filled 2023-12-16 (×2): qty 0.4

## 2023-12-16 MED ORDER — FUROSEMIDE 20 MG PO TABS
20.0000 mg | ORAL_TABLET | Freq: Every day | ORAL | Status: DC
  Administered 2023-12-16 – 2023-12-17 (×2): 20 mg via ORAL
  Filled 2023-12-16 (×2): qty 1

## 2023-12-16 NOTE — Telephone Encounter (Signed)
 Left voicemail to return call to office

## 2023-12-16 NOTE — Progress Notes (Signed)
 Nurse requested Mobility Specialist to perform oxygen saturation test with pt which includes removing pt from oxygen both at rest and while ambulating.  Below are the results from that testing.     Patient Saturations on Room Air at Rest = spO2 86%  Patient Saturations on Room Air while Ambulating = sp02 86% .    Patient Saturations on 4 Liters of oxygen while Ambulating = sp02 90%  At end of testing pt left in room on 4  Liters of oxygen.  Reported results to nurse.   D'Vante Nicholaus Mobility Specialist Please contact via Special Educational Needs Teacher or Rehab office at [phone]

## 2023-12-16 NOTE — Progress Notes (Signed)
 Mobility Specialist Progress Note:   12/16/23 1120  Therapy Vitals  Temp 97.8 F (36.6 C)  Temp Source Oral  Pulse Rate 88  Resp 19  BP 114/72  Patient Position (if appropriate) Sitting  Oxygen Therapy  SpO2 91 %  Mobility  Activity Ambulated with assistance in hallway  Level of Assistance Contact guard assist, steadying assist  Assistive Device Front wheel walker  Distance Ambulated (ft) 50 ft  Activity Response Tolerated fair  Mobility Referral Yes  Mobility visit 1 Mobility  Mobility Specialist Start Time (ACUTE ONLY) 0946  Mobility Specialist Stop Time (ACUTE ONLY) 1017  Mobility Specialist Time Calculation (min) (ACUTE ONLY) 31 min    Pre Mobility:  86% SpO2 RA During Mobility:  90% SpO2 4L Post Mobility:  92% SpO2 4L  Pt received in bed, agreeable to mobility. Desat to 86% on EOB, requiring 4L O2 to return [facility]. Presented w/ persistent cough throughout. Ambulated w/ wide BOS and required min cues for walker management. Took x2 seated rest breaks d/t fatigue and DOE. Pt left in chair with call bell and all needs met.  D'Vante Nicholaus Mobility Specialist Please contact via Special Educational Needs Teacher or Rehab office at [phone]

## 2023-12-16 NOTE — Telephone Encounter (Signed)
   Transition of Care Follow-up Phone Call Request    Patient Name: Sherry Zimmerman Date of Birth: February 02, 1956 Date of Encounter: 12/16/2023  Primary Care Provider:  Jodie Lavern LITTIE, MD Primary Cardiologist:  None  [name] has been scheduled for a transition of care follow up appointment with a [facility] provider:  Appt with Rosaline Bane on 12/31/23 @ 10:05 AM  Please reach out to [name] within 48 hours of discharge to confirm appointment and review transition of care protocol questionnaire. Anticipated discharge date: 12/17/23  Artist Pouch, PA-C  12/16/2023, 1:10 PM

## 2023-12-16 NOTE — Progress Notes (Signed)
  Nicholaus, D'Vante J  Mobility Specialist   Progress Notes    Signed   Date of Service: 12/16/2023 11:56 AM   Signed      Nurse requested Mobility Specialist to perform oxygen saturation test with pt which includes removing pt from oxygen both at rest and while ambulating.  Below are the results from that testing.      Patient Saturations on Room Air at Rest = spO2 86%   Patient Saturations on Room Air while Ambulating = sp02 86% .     Patient Saturations on 4 Liters of oxygen while Ambulating = sp02 90%   At end of testing pt left in room on 4  Liters of oxygen.   Reported results to nurse.    D'Vante Nicholaus Mobility Specialist Please contact via Special Educational Needs Teacher or Rehab office at [phone]

## 2023-12-16 NOTE — Progress Notes (Signed)
 PROGRESS NOTE  Sherry Zimmerman  DOB: 24-Jul-1956  PCP: Jodie Lavern CROME, MD FMW:[id number]  DOA: 12/13/2023  LOS: 3 days  Hospital Day: 4  Brief narrative: Sherry Zimmerman is a 68 y.o. female with PMH significant for COPD, recently stopped smoking last 6 months, chronic anemia, Raynaud's disease, anxiety/depression, cerebellar ataxia. 1/5, patient presented to the ED with worsening shortness of breath On initial workup, troponin was elevated to over 1000 Started on heparin  drip Admitted to cardiology service Patient was also noted to be in COPD exacerbation PCCM was consulted, patient was started on BiPAP 1/7, cardiac cath showed 70 to 80% RCA stenosis, DES placed, normal left coronary artery system 1/7, TRH was consulted for medical management  Subjective: Patient was seen and examined this morning. Sitting up in recliner.  Not in distress.  On 3 L oxygen at rest. On ambulation, O2 sat dropped to 86% requiring 4 L to improve.  Home oxygen being arranged  Chart reviewed In the last 24 hours, Tmax 100.3 this morning, heart rate in 100s, blood pressure in low normal range this morning, on 4 L oxygen by nasal cannula currently. Last set of blood work this morning with WBC count 20,000, BUN/creatinine 34/1 Last chest x-ray from yesterday with emphysematous changes  Assessment and plan: NSTEMI Presented with shortness of breath.  Troponin 400>>1000 Initially started on IV heparin  drip 1/5 echo with significant drop in EF to 40 to 45% with regional WMA 1/7, cardiac cath done, DES to RCA Cardiology recommends aspirin  81 mg daily, prasugrel  10 mg daily for minimum 12 months Also on metoprolol  tartrate 12.5 mg twice daily, Lipitor  80 mg daily  COPD exacerbation Pulmonary following.   Recently quit smoking last 6 months  Initially treated with IV Solu-Medrol . Currently on doxycycline  100 mg twice daily, prednisone  40 mg daily Continue bronchodilators, antitussives No evidence of  pneumonia on last chest x-ray on 1/7. Leukocytosis likely due to steroids.  Procalcitonin not elevated on hold Lasix  Recent Labs  Lab 12/13/23 0424 12/13/23 0556 12/14/23 0620 12/15/23 0235 12/16/23 0226  WBC 13.8* 14.4* 11.5* 9.8 20.0*  PROCALCITON  --   --  <0.10  --   --     Acute respiratory failure with hypoxia Secondary to COPD exacerbation and NSTEMI Initially required BiPAP.  Currently on 3 to 4 L oxygen nasal cannula.  Wean down as tolerated  Check ambulatory oxygen requirement   HLD Lipitor     GAD Continue Effexor    Overactive bladder Continue oxybutynin    Chronic iron deficiency Vitamin B12 deficiency Continue iron and vitamin B12 supplement supplement.  Hemoglobin stable over 11 Recent Labs    11/17/23 1502 12/13/23 0424 12/13/23 0434 12/13/23 0556 12/14/23 0620 12/15/23 0235 12/16/23 0226  HGB  --    < > 13.3 11.4* 11.1* 11.2* 11.6*  MCV  --    < >  --  83.4 83.0 82.0 82.8  VITAMINB12 >1537*  --   --   --   --   --   --   FOLATE 16.6  --   --   --   --   --   --   FERRITIN 17.4  --   --   --   --   --   --   TIBC 448  --   --   --   --   --   --   IRON 121  --   --   --   --   --   --    < > =  values in this interval not displayed.     Mobility: Encourage ambulation.  Check ambulatory oxygen requirement  Goals of care   Code Status: Full Code    DVT prophylaxis:  enoxaparin  (LOVENOX ) injection 40 mg Start: 12/16/23 1000   Antimicrobials: Doxycycline  Fluid: None Consultants: Currently, PCCM Family Communication: None at bedside  Status: Inpatient Level of care:  Progressive Cardiac   Patient is from: Home Needs to continue in-hospital care: Continues to remain hypoxic Anticipated d/c to: Hopefully home in 1 to 2 days      Diet:  Diet Order             Diet Heart Room service appropriate? Yes; Fluid consistency: Thin  Diet effective now                   Scheduled Meds:  arformoterol   15 mcg Nebulization BID   aspirin   EC  81 mg Oral Daily   atorvastatin   80 mg Oral Daily   calcium -vitamin D   1 tablet Oral Q breakfast   cyanocobalamin   500 mcg Oral Daily   doxycycline   100 mg Oral Q12H   enoxaparin  (LOVENOX ) injection  40 mg Subcutaneous Q24H   ferrous sulfate   325 mg Oral Daily   furosemide   20 mg Oral Daily   metoprolol  succinate  25 mg Oral Daily   oxybutynin   5 mg Oral BID   potassium chloride   40 mEq Oral Daily   prasugrel   10 mg Oral Daily   predniSONE   40 mg Oral Q breakfast   revefenacin   175 mcg Nebulization Daily   sodium chloride  flush  3 mL Intravenous Q12H   venlafaxine  XR  150 mg Oral QHS    PRN meds: sodium chloride , acetaminophen , albuterol , benzonatate , LORazepam , nitroGLYCERIN , ondansetron  (ZOFRAN ) IV, sodium chloride  flush   Infusions:   sodium chloride  50 mL/hr at 12/15/23 0650   sodium chloride       Antimicrobials: Anti-infectives (From admission, onward)    Start     Dose/Rate Route Frequency Ordered Stop   12/14/23 1200  doxycycline  (VIBRA -TABS) tablet 100 mg        100 mg Oral Every 12 hours 12/14/23 1102         Objective: Vitals:   12/16/23 1120 12/16/23 1434  BP: 114/72 114/72  Pulse: 88 (!) 102  Resp: 19   Temp: 97.8 F (36.6 C)   SpO2: 91%     Intake/Output Summary (Last 24 hours) at 12/16/2023 1440 Last data filed at 12/16/2023 0719 Gross per 24 hour  Intake 455.26 ml  Output 1100 ml  Net -644.74 ml   Filed Weights   12/13/23 1405 12/13/23 1944 12/15/23 0557  Weight: 59.6 kg 60 kg 58.4 kg   Weight change:  Body mass index is 20.16 kg/m.   Physical Exam: General exam: Pleasant, elderly Caucasian female. Skin: No rashes, lesions or ulcers. HEENT: Atraumatic, normocephalic, no obvious bleeding Lungs: Clear to auscultation bilaterally, no wheezing CVS: S1, S2, no murmur,   GI/Abd: Soft, nontender, nondistended, bowel sound present,   CNS: Alert, awake, oriented x 3 Psychiatry: Mood appropriate,  Extremities: No pedal edema, no calf  tenderness,   Data Review: I have personally reviewed the laboratory data and studies available.  F/u labs  Unresulted Labs (From admission, onward)     Start     Ordered   12/15/23 0500  CBC  Daily,   R     Question:  Specimen collection method  Answer:  Lab=Lab collect  12/14/23 1806            Total time spent in review of labs and imaging, patient evaluation, formulation of plan, documentation and communication with family: 45 minutes  Signed, Chapman Rota, MD [facility] 12/16/2023

## 2023-12-16 NOTE — Progress Notes (Signed)
 Pt up in recliner on 4L, sts she was able to walk in hall short distance with RW. She reports significant h/o falls due to cerebellar ataxia. Her boyfriend and brother manage her home and ask her to stay seated. She is now on Effient /ASA. Asked pt if feels she could benefit from HHPT but she declined as she just had their services after her last admission.  Pt's [place] came out of her nose on my arrival therefore watched her on RA. Slowly decreased to 87 RA. Applied 1L however could not maintain higher than 89 until 3L placed. Left pt on 3L and gave her IS to practice.   Discussed with pt MI, stent, need for Effient , smoking cessation (quit 10/24), diet, and CRPII. Pt receptive. Did not give exercise guidelines due to fall risk however she could be a candidate for CRPII for recumbent machines. Will refer to [place] CRPII.  [phone] Aliene Aris BS, ACSM-CEP 12/16/2023 11:22 AM

## 2023-12-16 NOTE — Progress Notes (Signed)
 Patient Name: Sherry Zimmerman Date of Encounter: 12/16/2023 [facility] Cardiologist: None Dr.Derrall Hicks (new)  Interval Summary  .    Denies anginal chest pain.  SABRA Shortness of breath improving. Remains on nasal cannula oxygen.  Vital Signs .    Vitals:   12/16/23 0718 12/16/23 0740 12/16/23 0932 12/16/23 1120  BP: 105/76  105/76 114/72  Pulse: (!) 109  (!) 109 88  Resp: 14   19  Temp: 98.3 F (36.8 C)   97.8 F (36.6 C)  TempSrc: Oral   Oral  SpO2: 91% 95%  91%  Weight:      Height:        Intake/Output Summary (Last 24 hours) at 12/16/2023 1256 Last data filed at 12/16/2023 0719 Gross per 24 hour  Intake 455.26 ml  Output 1100 ml  Net -644.74 ml   Net IO Since Admission: -1,438.29 mL [12/16/23 1256]     12/15/2023    5:57 AM 12/13/2023    7:44 PM 12/13/2023    2:05 PM  Last 3 Weights  Weight (lbs) 128 lb 12 oz 132 lb 4.4 oz 131 lb 6.3 oz  Weight (kg) 58.4 kg 60 kg 59.6 kg      Telemetry/ECG/cardiac studies    Sinus rhythm and sinus tachycardia- Personally Reviewed  Heart catheterization 12/15/2023   Prox RCA-1 lesion is 40% stenosed.   Lesion Segment prox RCA-2 lesion is 80% stenosed.  Prox RCA to Mid RCA lesion is 70% stenosed.   A drug-eluting stent was successfully placed covering both lesions using a SYNERGY XD 3.50X20-deployed to 3.85 mm. Post intervention, there is a 0% residual stenosis for both lesions.  TIMI-3 flow pre and post   Prox LAD lesion is 10% stenosed.   LV end diastolic pressure is mildly elevated.   There is no aortic valve stenosis.   Diagnostic                                                                                 Intervention Dominance: Right    Severe single-vessel disease with 40% proximal (dominant) RCA followed by tandem 80% and 70% lesions in the proximal to mid vessel just prior to a large RV marginal branch Successful DES PCI of RCA covering the 80% and 70% stenoses with a single Synergy XD 3.5 mm x 20 mm deployed  to 3.8 mm. Otherwise normal Left Coronary Artery System with very tortuous heart. LVEDP 17 to 20 mmHg.      RECOMMENDATIONS   Anticipated discharge date to be determined-> May continue to require titration of cardiac and COPD medications.   Recommend uninterrupted dual antiplatelet therapy with Aspirin  81mg  daily and Prasugrel  10mg  daily for a minimum of 12 months (ACS-Class I recommendation).  Physical Exam .   General: Appears older than stated age, hemodynamically stable, no acute distress, currently on nasal cannula oxygen. Neck: No JVP. Cardiac: Regular, positive S1-S2, no murmurs rubs or gallops appreciated. Lungs: Currently on nasal cannula oxygen, decreased breath sounds bilaterally, no rales or rhonchi's. GI: Soft, nontender, nondistended, positive bowel sounds in all 4 quadrants. Lower extremities: No edema  Assessment & Plan .    Sherry Zimmerman is  a 68 y.o. female with COPD, former tobacco use (quit in October 2024), depression/anxiety, cerebellar ataxia, IDA who is being seen for the evaluation of acute worsening of her shortness of breath with NSTEMI.    Impression:  NSTEMI. Coronary disease status post PCI Coronary artery calcification on nongated CT study. Aortic atherosclerosis. Elevated LP(a). COPD/emphysema. Former smoker  Recommendations:  NSTEMI: Coronary disease status post PCI Coronary artery calcification: Aortic atherosclerosis: Continue aspirin  81 mg p.o. daily. Continue Effient  10 mg p.o. daily. Continue statin therapy. Dual antiplatelet therapy for 1 year in the setting of NSTEMI- December 14, 2024 Will transition Lopressor  to Toprol -XL 25 mg p.o. daily Continue sublingual nitroglycerin  tablets on as needed basis. Reemphasized the importance of complete smoking cessation-has been smoke-free since October 2024  Elevated LP(a): Continue statin therapy. Calculated LDL currently 64 mg/dL. Does not endorse myalgias.  COPD/emphysema:  Currently  being followed by pulmonary medicine, appreciate their recommendations and follow with her. LVEDP was elevated-continue Lasix . Change IV Lasix  to Lasix  p.o. 20 mg p.o. daily  [facility] will sign off.   Medication Recommendations: Dual antiplatelet therapy, Lasix  20 mg p.o. daily, other medications per primary team Other recommendations (labs, testing, etc): N/A Follow up as an outpatient: Will arrange 4-week follow-up visit with APP in 2-month follow-up visit.  This note was created using a voice recognition software as a result there may be grammatical errors inadvertently enclosed that do not reflect the nature of this encounter. Every attempt is made to correct such errors.   Madonna Large, DO, [facility]  [address] [place], [place] [address] Pager: [phone] Office: [phone] 12/16/2023 12:56 PM

## 2023-12-16 NOTE — Plan of Care (Signed)
   Problem: Activity: Goal: Risk for activity intolerance will decrease Outcome: Progressing   Problem: Nutrition: Goal: Adequate nutrition will be maintained Outcome: Progressing   Problem: Coping: Goal: Level of anxiety will decrease Outcome: Progressing   Problem: Elimination: Goal: Will not experience complications related to bowel motility Outcome: Progressing Goal: Will not experience complications related to urinary retention Outcome: Progressing   Problem: Pain Management: Goal: General experience of comfort will improve Outcome: Progressing

## 2023-12-17 ENCOUNTER — Other Ambulatory Visit (HOSPITAL_COMMUNITY): Payer: Self-pay

## 2023-12-17 ENCOUNTER — Encounter: Payer: Self-pay | Admitting: Hematology & Oncology

## 2023-12-17 DIAGNOSIS — I214 Non-ST elevation (NSTEMI) myocardial infarction: Secondary | ICD-10-CM | POA: Diagnosis not present

## 2023-12-17 LAB — CBC
HCT: 34.2 % — ABNORMAL LOW (ref 36.0–46.0)
Hemoglobin: 10.8 g/dL — ABNORMAL LOW (ref 12.0–15.0)
MCH: 25.3 pg — ABNORMAL LOW (ref 26.0–34.0)
MCHC: 31.6 g/dL (ref 30.0–36.0)
MCV: 80.1 fL (ref 80.0–100.0)
Platelets: 441 10*3/uL — ABNORMAL HIGH (ref 150–400)
RBC: 4.27 MIL/uL (ref 3.87–5.11)
RDW: 17.2 % — ABNORMAL HIGH (ref 11.5–15.5)
WBC: 15.8 10*3/uL — ABNORMAL HIGH (ref 4.0–10.5)
nRBC: 0 % (ref 0.0–0.2)

## 2023-12-17 MED ORDER — PRASUGREL HCL 10 MG PO TABS
10.0000 mg | ORAL_TABLET | Freq: Every day | ORAL | 2 refills | Status: AC
  Filled 2023-12-17: qty 30, 30d supply, fill #0
  Filled 2024-01-14: qty 30, 30d supply, fill #1

## 2023-12-17 MED ORDER — PREDNISONE 10 MG PO TABS
ORAL_TABLET | ORAL | 0 refills | Status: AC
  Filled 2023-12-17: qty 19, 7d supply, fill #0

## 2023-12-17 MED ORDER — BENZONATATE 200 MG PO CAPS
200.0000 mg | ORAL_CAPSULE | Freq: Three times a day (TID) | ORAL | 0 refills | Status: DC | PRN
  Filled 2023-12-17: qty 20, 7d supply, fill #0

## 2023-12-17 MED ORDER — ASPIRIN 81 MG PO TBEC
81.0000 mg | DELAYED_RELEASE_TABLET | Freq: Every day | ORAL | 2 refills | Status: AC
  Filled 2023-12-17: qty 30, 30d supply, fill #0

## 2023-12-17 MED ORDER — FUROSEMIDE 20 MG PO TABS
20.0000 mg | ORAL_TABLET | Freq: Every day | ORAL | 2 refills | Status: DC
  Filled 2023-12-17: qty 30, 30d supply, fill #0
  Filled 2024-01-05 – 2024-01-14 (×2): qty 30, 30d supply, fill #1

## 2023-12-17 MED ORDER — DOXYCYCLINE HYCLATE 100 MG PO TABS
100.0000 mg | ORAL_TABLET | Freq: Two times a day (BID) | ORAL | 0 refills | Status: AC
  Filled 2023-12-17: qty 6, 3d supply, fill #0

## 2023-12-17 MED ORDER — METOPROLOL SUCCINATE ER 25 MG PO TB24
25.0000 mg | ORAL_TABLET | Freq: Every day | ORAL | 2 refills | Status: DC
  Filled 2023-12-17: qty 30, 30d supply, fill #0
  Filled 2024-01-14: qty 30, 30d supply, fill #1

## 2023-12-17 MED ORDER — ATORVASTATIN CALCIUM 80 MG PO TABS
80.0000 mg | ORAL_TABLET | Freq: Every day | ORAL | 2 refills | Status: DC
  Filled 2023-12-17: qty 30, 30d supply, fill #0
  Filled 2024-01-14: qty 30, 30d supply, fill #1

## 2023-12-17 NOTE — Progress Notes (Signed)
 Mobility Specialist Progress Note:   12/17/23 0900  Oxygen Therapy  O2 Device HFNC  O2 Flow Rate (L/min) 3 L/min  Mobility  Activity Ambulated with assistance in hallway  Level of Assistance Contact guard assist, steadying assist  Assistive Device Front wheel walker  Distance Ambulated (ft) 45 ft  Activity Response Tolerated well  Mobility Referral Yes  Mobility visit 1 Mobility  Mobility Specialist Start Time (ACUTE ONLY) [id number]  Mobility Specialist Stop Time (ACUTE ONLY) 0853  Mobility Specialist Time Calculation (min) (ACUTE ONLY) 20 min    Pre Mobility:  93% SpO2 During Mobility: 92% SpO2 Post Mobility: 95% SpO2  Pt received in bed, eager to mobility. C/o SOB and had some DOE during ambulation. SpO2 levels in 90s throughout on 3L w/ consistent pleth. Returned to room w/o fault. Pt left in chair with call bell and all needs met.  D'Vante Nicholaus Mobility Specialist Please contact via Special Educational Needs Teacher or Rehab office at [phone]

## 2023-12-17 NOTE — Discharge Summary (Addendum)
 Physician Discharge Summary  Linetta Regner Oyer FMW:[id number] DOB: [birth year] DOA: 12/13/2023  PCP: Jodie Lavern CROME, MD  Admit date: 12/13/2023 Discharge date: 12/17/2023  Admitted From: home Discharge disposition: home with O2  Recommendations at discharge:  Complete 5-day course of doxycycline  at discharge.  Continue tapering course of prednisone  You have a stent placed in your heart.  Continue aspirin  81 mg daily, prasugrel  10 mg daily for minimum 12 months Lipitor  dose has been increased from 10 mg to 80 mg daily. Follow-up with cardiology as an outpatient in 4 weeks.   Brief narrative: SHAMARA SOZA is a 68 y.o. female with PMH significant for COPD, recently stopped smoking last 6 months, chronic anemia, Raynaud's disease, anxiety/depression, cerebellar ataxia. 1/5, patient presented to the ED with worsening shortness of breath On initial workup, troponin was elevated to over 1000 Started on heparin  drip Admitted to cardiology service Patient was also noted to be in COPD exacerbation PCCM was consulted, patient was started on BiPAP 1/7, cardiac cath showed 70 to 80% RCA stenosis, DES placed, normal left coronary artery system 1/7, TRH was consulted for medical management  Subjective: Patient was seen and examined this morning. Sitting up in recliner.  Not in distress.  On 3 L oxygen at rest. Yesterday on ambulation, O2 sat dropped to 86% requiring 4 L to improve.  Home oxygen arranged No fever, WC count improved and labs today  Hospital course: NSTEMI Presented with shortness of breath.  Troponin 400>>1000 Initially started on IV heparin  drip 1/5 echo with significant drop in EF to 40 to 45% with regional WMA 1/7, cardiac cath done, DES to RCA Cardiology recommended aspirin  81 mg daily, prasugrel  10 mg daily for minimum 12 months Also on metoprolol  tartrate 12.5 mg twice daily, Lipitor  80 mg daily  COPD exacerbation Pulmonary following.   Recently quit smoking last  6 months  Initially treated with IV Solu-Medrol . Currently on doxycycline  100 mg twice daily, prednisone  40 mg daily Complete 5-day course of doxycycline  at discharge.  Continue tapering course of prednisone  Continue bronchodilators, antitussives Leukocytosis likely due to steroids.  Procalcitonin not elevated. Recent Labs  Lab 12/13/23 0556 12/14/23 0620 12/15/23 0235 12/16/23 0226 12/17/23 0220  WBC 14.4* 11.5* 9.8 20.0* 15.8*  PROCALCITON  --  <0.10  --   --   --     Acute respiratory failure with hypoxia Secondary to COPD exacerbation and NSTEMI Initially required BiPAP.  Currently on 3 to 4 L oxygen nasal cannula.  Wean down as tolerated  Yesterday on ambulation, O2 sat dropped to 86% requiring 4 L to improve.  Home oxygen arranged  HLD Lipitor     GAD Continue Effexor    Overactive bladder Continue oxybutynin    Chronic iron deficiency Vitamin B12 deficiency Continue iron and vitamin B12 supplement supplement.  Hemoglobin stable close to 11 Recent Labs    11/17/23 1502 12/13/23 0424 12/13/23 0556 12/14/23 0620 12/15/23 0235 12/16/23 0226 12/17/23 0220  HGB  --    < > 11.4* 11.1* 11.2* 11.6* 10.8*  MCV  --    < > 83.4 83.0 82.0 82.8 80.1  VITAMINB12 >1537*  --   --   --   --   --   --   FOLATE 16.6  --   --   --   --   --   --   FERRITIN 17.4  --   --   --   --   --   --   TIBC 448  --   --   --   --   --   --  IRON 121  --   --   --   --   --   --    < > = values in this interval not displayed.     Mobility: Independently ambulates  Goals of care   Code Status: Full Code   Diet:  Diet Order             Diet - low sodium heart healthy           Diet Heart Room service appropriate? Yes; Fluid consistency: Thin  Diet effective now                   Nutritional status:  Body mass index is 20.16 kg/m.       Wounds:  -    Discharge Exam:   Vitals:   12/16/23 2339 12/17/23 0335 12/17/23 0718 12/17/23 0806  BP: 121/74 124/76  (!)  117/58  Pulse: (!) 110 86  (!) 113  Resp: 19 19  18   Temp: 98.5 F (36.9 C) 98.2 F (36.8 C)  98 F (36.7 C)  TempSrc: Oral Oral  Oral  SpO2: 90% 94% 94% 91%  Weight:      Height:        Body mass index is 20.16 kg/m.   General exam: Pleasant, elderly Caucasian female. Skin: No rashes, lesions or ulcers. HEENT: Atraumatic, normocephalic, no obvious bleeding Lungs: Clear to auscultation bilaterally, no wheezing, no crackles CVS: S1, S2, no murmur,   GI/Abd: Soft, nontender, nondistended, bowel sound present,   CNS: Alert, awake, oriented x 3 Psychiatry: Mood appropriate, cheerful Extremities: No pedal edema, no calf tenderness,   Follow ups:    Follow-up Information     [facility] Follow up.   Why: nebulizer machine Contact information: [phone]        Jodie Lavern CROME, MD Follow up.   Specialty: Family Medicine Contact information: [address] [place] [address] [phone]                 Discharge Instructions:   Discharge Instructions     Amb Referral to Cardiac Rehabilitation   Complete by: As directed    Diagnosis:  Coronary Stents NSTEMI PTCA     After initial evaluation and assessments completed: Virtual Based Care may be provided alone or in conjunction with Phase 2 Cardiac Rehab based on patient barriers.: Yes   Intensive Cardiac Rehabilitation (ICR) [facility] location only OR Traditional Cardiac Rehabilitation (TCR) *If criteria for ICR are not met will enroll in TCR [facility] only): Yes   Call MD for:  difficulty breathing, headache or visual disturbances   Complete by: As directed    Call MD for:  extreme fatigue   Complete by: As directed    Call MD for:  hives   Complete by: As directed    Call MD for:  persistant dizziness or light-headedness   Complete by: As directed    Call MD for:  persistant nausea and vomiting   Complete by: As directed    Call MD for:  severe uncontrolled pain   Complete by: As directed    Call MD  for:  temperature >100.4   Complete by: As directed    Diet - low sodium heart healthy   Complete by: As directed    Discharge instructions   Complete by: As directed    Recommendations at discharge:   Complete 5-day course of doxycycline  at discharge.  Continue tapering course of prednisone   You have a stent placed in your heart.  Continue aspirin  81 mg daily, prasugrel  10 mg daily for minimum 12 months  Follow-up with cardiology as an outpatient in 4 weeks.  General discharge instructions: Follow with Primary MD Jodie Lavern CROME, MD in 7 days  Please request your PCP  to go over your hospital tests, procedures, radiology results at the follow up. Please get your medicines reviewed and adjusted.  Your PCP may decide to repeat certain labs or tests as needed. Do not drive, operate heavy machinery, perform activities at heights, swimming or participation in water activities or provide baby sitting services if your were admitted for syncope or siezures until you have seen by Primary MD or a Neurologist and advised to do so again.   Controlled Substance Reporting System database was reviewed. Do not drive, operate heavy machinery, perform activities at heights, swim, participate in water activities or provide baby-sitting services while on medications for pain, sleep and mood until your outpatient physician has reevaluated you and advised to do so again.  You are strongly recommended to comply with the dose, frequency and duration of prescribed medications. Activity: As tolerated with Full fall precautions use walker/cane & assistance as needed Avoid using any recreational substances like cigarette, tobacco, alcohol, or non-prescribed drug. If you experience worsening of your admission symptoms, develop shortness of breath, life threatening emergency, suicidal or homicidal thoughts you must seek medical attention immediately by calling 911 or calling your MD immediately  if symptoms  less severe. You must read complete instructions/literature along with all the possible adverse reactions/side effects for all the medicines you take and that have been prescribed to you. Take any new medicine only after you have completely understood and accepted all the possible adverse reactions/side effects.  Wear Seat belts while driving. You were cared for by a hospitalist during your hospital stay. If you have any questions about your discharge medications or the care you received while you were in the hospital after you are discharged, you can call the unit and ask to speak with the hospitalist or the covering physician. Once you are discharged, your primary care physician will handle any further medical issues. Please note that NO REFILLS for any discharge medications will be authorized once you are discharged, as it is imperative that you return to your primary care physician (or establish a relationship with a primary care physician if you do not have one).   Increase activity slowly   Complete by: As directed        Discharge Medications:   Allergies as of 12/17/2023       Reactions   Morphine    Unknown reaction   Sulfa Antibiotics Nausea And Vomiting, Other (See Comments)   Made me sick        Medication List     TAKE these medications    albuterol  108 (90 Base) MCG/ACT inhaler Commonly known as: VENTOLIN  HFA Inhale 1-2 puffs into the lungs every 4 (four) hours as needed for wheezing or shortness of breath.   alendronate  70 MG tablet Commonly known as: FOSAMAX  Take 1 tablet (70 mg total) by mouth every 7 (seven) days. Take with a full glass of water on an empty stomach.   aspirin  EC 81 MG tablet Take 1 tablet (81 mg total) by mouth daily. Swallow whole. Start taking on: December 18, 2023   atorvastatin  80 MG tablet Commonly known as: LIPITOR  Take 1 tablet (80 mg total) by mouth daily. Start taking  on: December 18, 2023 What changed:  medication strength how  much to take when to take this   benzonatate  200 MG capsule Commonly known as: TESSALON  Take 1 capsule (200 mg total) by mouth 3 (three) times daily as needed for cough.   Bevespi  Aerosphere 9-4.8 MCG/ACT Aero Generic drug: Glycopyrrolate -Formoterol  Inhale 2 puffs into the lungs 2 (two) times daily.   calcium -vitamin D  500-5 MG-MCG tablet Commonly known as: OSCAL WITH D Take 1 tablet by mouth daily with breakfast.   cyanocobalamin  500 MCG tablet Commonly known as: VITAMIN B12 Take 500 mcg by mouth daily.   doxycycline  100 MG tablet Commonly known as: VIBRA -TABS Take 1 tablet (100 mg total) by mouth every 12 (twelve) hours for 3 days.   ferrous sulfate  325 (65 FE) MG EC tablet Take 325 mg by mouth daily.   furosemide  20 MG tablet Commonly known as: LASIX  Take 1 tablet (20 mg total) by mouth daily. Start taking on: December 18, 2023   metoprolol  succinate 25 MG 24 hr tablet Commonly known as: TOPROL -XL Take 1 tablet (25 mg total) by mouth daily. Start taking on: December 18, 2023   Na Sulfate-K Sulfate-Mg Sulf 17.5-3.13-1.6 GM/177ML Soln Use as directed; may use generic; goodrx card if insurance will not cover generic   oxybutynin  5 MG tablet Commonly known as: DITROPAN  Take 1 tablet (5 mg total) by mouth 2 (two) times daily.   prasugrel  10 MG Tabs tablet Commonly known as: EFFIENT  Take 1 tablet (10 mg total) by mouth daily. Start taking on: December 18, 2023   predniSONE  10 MG tablet Commonly known as: DELTASONE  Take 4 tablets daily X 2 days, then, Take 3 tablets daily X 2 days, then, Take 2 tablets daily X 2 days, then, Take 1 tablets daily X 1 day.   valACYclovir  1000 MG tablet Commonly known as: Valtrex  Take 2 tablets (2,000 mg total) by mouth 2 (two) times daily. Once, as needed for cold sores   venlafaxine  XR 150 MG 24 hr capsule Commonly known as: EFFEXOR -XR TAKE 1 CAPSULE BY MOUTH AT BEDTIME **TAKE  WITH  75MG   CAPSULES  FOR  225MG   TOTAL  DAILY   DOSE What changed:  how much to take how to take this when to take this additional instructions   venlafaxine  XR 75 MG 24 hr capsule Commonly known as: EFFEXOR -XR TAKE 1 CAPSULE BY MOUTH ONCE DAILY **TAKE  WITH  150MG   CAPSULES  FOR  225MG   TOTAL  DAILY  DOSE What changed:  how much to take how to take this when to take this               Durable Medical Equipment  (From admission, onward)           Start     Ordered   12/16/23 1415  For home use only DME oxygen  Once       Question Answer Comment  Length of Need 6 Months   Mode or (Route) Nasal cannula   Liters per Minute 4   Frequency Continuous (stationary and portable oxygen unit needed)   Oxygen conserving device Yes   Oxygen delivery system Gas      12/16/23 1414   12/15/23 1002  For home use only DME Nebulizer/meds  Once       Question Answer Comment  Patient needs a nebulizer to treat with the following condition COPD (chronic obstructive pulmonary disease) (HCC)   Length of Need Lifetime  12/15/23 1001             The results of significant diagnostics from this hospitalization (including imaging, microbiology, ancillary and laboratory) are listed below for reference.    Procedures and Diagnostic Studies:   DG CHEST PORT 1 VIEW Result Date: 12/14/2023 CLINICAL DATA:  Breathing difficulty/dyspnea EXAM: PORTABLE CHEST 1 VIEW COMPARISON:  12/13/2023 FINDINGS: Cardiac and mediastinal margins appear normal. Emphysema noted. Mild scarring or atelectasis in the left lower lobe. Mild spurring of the humeral heads bilaterally. No blunting of the costophrenic angles. IMPRESSION: 1.  Emphysema (ICD10-J43.9). 2. Mild scarring or atelectasis in the left lower lobe. Electronically Signed   By: Ryan Salvage M.D.   On: 12/14/2023 12:03   ECHOCARDIOGRAM COMPLETE Result Date: 12/13/2023    ECHOCARDIOGRAM REPORT   Patient Name:   NOLAN LASSER Date of Exam: 12/13/2023 Medical Rec #:  [id number]          Height:       67.0 in Accession #:    [id number]        Weight:       135.0 lb Date of Birth:  [birth year]         BSA:          1.711 m Patient Age:    67 years          BP:           121/81 mmHg Patient Gender: F                 HR:           90 bpm. Exam Location:  Inpatient Procedure: 2D Echo, Cardiac Doppler, Color Doppler, 3D Echo and Strain Analysis Indications:    R06.02 SOB  History:        Patient has no prior history of Echocardiogram examinations.                 COPD; Signs/Symptoms:Shortness of Breath.  Sonographer:    Logan Shove Referring Phys: [id number] DENNIS NARCISSE JR IMPRESSIONS  1. Left ventricular ejection fraction, by estimation, is 40 to 45%. Left ventricular ejection fraction by 3D volume is 46 %. The left ventricle has mildly decreased function. The left ventricle demonstrates regional wall motion abnormalities (see scoring diagram/findings for description). Left ventricular diastolic parameters are indeterminate.  2. Right ventricular systolic function is normal. The right ventricular size is normal.  3. The mitral valve is grossly normal. No evidence of mitral valve regurgitation. No evidence of mitral stenosis.  4. The aortic valve was not well visualized. Aortic valve regurgitation is not visualized. No aortic stenosis is present.  5. The inferior vena cava is normal in size with greater than 50% respiratory variability, suggesting right atrial pressure of 3 mmHg. Comparison(s): No prior Echocardiogram. FINDINGS  Left Ventricle: Left ventricular ejection fraction, by estimation, is 40 to 45%. Left ventricular ejection fraction by 3D volume is 46 %. The left ventricle has mildly decreased function. The left ventricle demonstrates regional wall motion abnormalities. The global longitudinal strain is normal despite suboptimal segment tracking. The left ventricular internal cavity size was normal in size. There is no left ventricular hypertrophy. Left ventricular diastolic parameters are  indeterminate.  LV Wall Scoring: The anterior septum, mid inferolateral segment, mid inferoseptal segment, mid inferior segment, and basal inferoseptal segment are hypokinetic. Right Ventricle: The right ventricular size is normal. No increase in right ventricular wall thickness. Right ventricular systolic function is normal. Left Atrium: Left atrial size  was normal in size. Right Atrium: Right atrial size was normal in size. Pericardium: There is no evidence of pericardial effusion. Mitral Valve: The mitral valve is grossly normal. No evidence of mitral valve regurgitation. No evidence of mitral valve stenosis. Tricuspid Valve: The tricuspid valve is normal in structure. Tricuspid valve regurgitation is not demonstrated. No evidence of tricuspid stenosis. Aortic Valve: The aortic valve was not well visualized. Aortic valve regurgitation is not visualized. No aortic stenosis is present. Aortic valve mean gradient measures 3.0 mmHg. Aortic valve peak gradient measures 5.7 mmHg. Aortic valve area, by VTI measures 2.75 cm. Pulmonic Valve: The pulmonic valve was not well visualized. Pulmonic valve regurgitation is not visualized. No evidence of pulmonic stenosis. Aorta: The aortic root is normal in size and structure. Venous: The inferior vena cava is normal in size with greater than 50% respiratory variability, suggesting right atrial pressure of 3 mmHg. IAS/Shunts: The interatrial septum was not well visualized.  LEFT VENTRICLE PLAX 2D LVIDd:         5.00 cm         Diastology LVIDs:         4.10 cm         LV e' medial:    5.98 cm/s LV PW:         0.90 cm         LV E/e' medial:  10.7 LV IVS:        0.70 cm         LV e' lateral:   6.85 cm/s LVOT diam:     2.00 cm         LV E/e' lateral: 9.3 LV SV:         62 LV SV Index:   36 LVOT Area:     3.14 cm        3D Volume EF                                LV 3D EF:    Left                                             ventricul                                              ar                                             ejection                                             fraction                                             by 3D  volume is                                             46 %.                                 3D Volume EF:                                3D EF:        46 %                                LV EDV:       118 ml                                LV ESV:       64 ml                                LV SV:        54 ml RIGHT VENTRICLE             IVC RV Basal diam:  3.30 cm     IVC diam: 1.70 cm RV Mid diam:    1.70 cm RV S prime:     15.20 cm/s TAPSE (M-mode): 1.8 cm LEFT ATRIUM             Index        RIGHT ATRIUM           Index LA diam:        3.60 cm 2.10 cm/m   RA Area:     12.50 cm LA Vol (A2C):   31.3 ml 18.29 ml/m  RA Volume:   27.90 ml  16.30 ml/m LA Vol (A4C):   25.1 ml 14.67 ml/m LA Biplane Vol: 29.6 ml 17.30 ml/m  AORTIC VALVE AV Area (Vmax):    3.01 cm AV Area (Vmean):   2.87 cm AV Area (VTI):     2.75 cm AV Vmax:           119.00 cm/s AV Vmean:          82.100 cm/s AV VTI:            0.224 m AV Peak Grad:      5.7 mmHg AV Mean Grad:      3.0 mmHg LVOT Vmax:         114.00 cm/s LVOT Vmean:        74.900 cm/s LVOT VTI:          0.196 m LVOT/AV VTI ratio: 0.88  AORTA Ao Root diam: 3.30 cm MITRAL VALVE MV Area (PHT): 3.65 cm    SHUNTS MV Decel Time: 208 msec    Systemic VTI:  0.20 m MV E velocity: 63.90 cm/s  Systemic Diam: 2.00 cm MV A velocity: 82.80 cm/s MV E/A ratio:  0.77 Sunit Tolia Electronically signed by Madonna Large Signature Date/Time: 12/13/2023/6:05:05 PM    Final    CT Angio Chest PE W and/or Wo Contrast Result Date:  12/13/2023 CLINICAL DATA:  68 year old female with sudden onset of shortness of breath. Suspected pulmonary embolism. EXAM: CT ANGIOGRAPHY CHEST WITH CONTRAST TECHNIQUE: Multidetector CT imaging of the chest was performed using the standard protocol during bolus administration of  intravenous contrast. Multiplanar CT image reconstructions and MIPs were obtained to evaluate the vascular anatomy. RADIATION DOSE REDUCTION: This exam was performed according to the departmental dose-optimization program which includes automated exposure control, adjustment of the mA and/or kV according to patient size and/or use of iterative reconstruction technique. CONTRAST:  50mL OMNIPAQUE  IOHEXOL  350 MG/ML SOLN COMPARISON:  Chest CTA 11/13/2023. FINDINGS: Cardiovascular: No filling defects are noted within the pulmonary arterial tree to suggest pulmonary embolism. Heart size is normal. There is no significant pericardial fluid, thickening or pericardial calcification. There is aortic atherosclerosis, as well as atherosclerosis of the great vessels of the mediastinum and the coronary arteries, including calcified atherosclerotic plaque in the left anterior descending and right coronary arteries. Mediastinum/Nodes: No pathologically enlarged mediastinal or hilar lymph nodes. Esophagus is unremarkable in appearance. No axillary lymphadenopathy. Lungs/Pleura: Diffuse bronchial wall thickening with moderate to severe centrilobular and paraseptal emphysema. No acute consolidative airspace disease. No pleural effusions. Scattered areas of linear scarring are noted throughout the lung bases, most severe in the periphery of the left lower lobe, likely sequela of remote infection. No definite suspicious appearing pulmonary nodules or masses are noted. Upper Abdomen: Aortic atherosclerosis. Musculoskeletal: There are no aggressive appearing lytic or blastic lesions noted in the visualized portions of the skeleton. Review of the MIP images confirms the above findings. IMPRESSION: 1. No acute findings are noted in the thorax to account for the patient's symptoms. Specifically, no evidence of pulmonary embolism. 2. Aortic atherosclerosis, in addition to two-vessel coronary artery disease. Please note that although the  presence of coronary artery calcium  documents the presence of coronary artery disease, the severity of this disease and any potential stenosis cannot be assessed on this non-gated CT examination. Assessment for potential risk factor modification, dietary therapy or pharmacologic therapy may be warranted, if clinically indicated. 3. Diffuse bronchial wall thickening with moderate to severe centrilobular and paraseptal emphysema; imaging findings suggestive of underlying COPD. Aortic Atherosclerosis (ICD10-I70.0) and Emphysema (ICD10-J43.9). Electronically Signed   By: Toribio Aye M.D.   On: 12/13/2023 06:04   DG Chest Port 1 View Result Date: 12/13/2023 CLINICAL DATA:  68 year old female with history of emphysema presenting with sudden onset of shortness of breath. EXAM: PORTABLE CHEST 1 VIEW COMPARISON:  Chest x-ray 11/13/2023. FINDINGS: Lung volumes are low. No consolidative airspace disease. No pleural effusions. No pneumothorax. No pulmonary nodule or mass noted. Pulmonary vasculature and the cardiomediastinal silhouette are within normal limits. IMPRESSION: 1. Low lung volumes without radiographic evidence of acute cardiopulmonary disease. Electronically Signed   By: Toribio Aye M.D.   On: 12/13/2023 05:55     Labs:   Basic Metabolic Panel: Recent Labs  Lab 12/13/23 0424 12/13/23 0434 12/13/23 0646 12/14/23 0620 12/15/23 0548 12/16/23 0226  NA 135 137  --  139 139 136  K 3.5 3.5  --  4.1 4.1 4.0  CL 102  --   --  105 103 99  CO2 20*  --   --  25 24 23   GLUCOSE 174*  --   --  100* 133* 98  BUN 10  --   --  14 20 34*  CREATININE 0.77  --   --  0.82 0.91 1.00  CALCIUM  8.8*  --   --  8.8* 8.9 8.9  MG  --   --  1.9  --   --   --    GFR Estimated Creatinine Clearance: 49.6 mL/min (by C-G formula based on SCr of 1 mg/dL). Liver Function Tests: No results for input(s): AST, ALT, ALKPHOS, BILITOT, PROT, ALBUMIN in the last 168 hours. No results for input(s): LIPASE,  AMYLASE in the last 168 hours. No results for input(s): AMMONIA in the last 168 hours. Coagulation profile Recent Labs  Lab 12/14/23 0620  INR 1.0    CBC: Recent Labs  Lab 12/13/23 0424 12/13/23 0434 12/13/23 0556 12/14/23 0620 12/15/23 0235 12/16/23 0226 12/17/23 0220  WBC 13.8*  --  14.4* 11.5* 9.8 20.0* 15.8*  NEUTROABS 10.9*  --   --   --   --   --   --   HGB 11.9*   < > 11.4* 11.1* 11.2* 11.6* 10.8*  HCT 39.6   < > 36.7 36.2 37.0 37.6 34.2*  MCV 84.3  --  83.4 83.0 82.0 82.8 80.1  PLT 469*  --  449* 400 437* 418* 441*   < > = values in this interval not displayed.   Cardiac Enzymes: No results for input(s): CKTOTAL, CKMB, CKMBINDEX, TROPONINI in the last 168 hours. BNP: Invalid input(s): POCBNP CBG: No results for input(s): GLUCAP in the last 168 hours. D-Dimer No results for input(s): DDIMER in the last 72 hours. Hgb A1c No results for input(s): HGBA1C in the last 72 hours. Lipid Profile No results for input(s): CHOL, HDL, LDLCALC, TRIG, CHOLHDL, LDLDIRECT in the last 72 hours. Thyroid  function studies No results for input(s): TSH, T4TOTAL, T3FREE, THYROIDAB in the last 72 hours.  Invalid input(s): FREET3 Anemia work up No results for input(s): VITAMINB12, FOLATE, FERRITIN, TIBC, IRON, RETICCTPCT in the last 72 hours. Microbiology Recent Results (from the past 240 hours)  Resp panel by RT-PCR (RSV, Flu A&B, Covid) Anterior Nasal Swab     Status: None   Collection Time: 12/13/23  4:24 AM   Specimen: Anterior Nasal Swab  Result Value Ref Range Status   SARS Coronavirus 2 by RT PCR NEGATIVE NEGATIVE Final   Influenza A by PCR NEGATIVE NEGATIVE Final   Influenza B by PCR NEGATIVE NEGATIVE Final    Comment: (NOTE) The Xpert Xpress SARS-CoV-2/FLU/RSV plus assay is intended as an aid in the diagnosis of influenza from Nasopharyngeal swab specimens and should not be used as a sole basis for treatment. Nasal  washings and aspirates are unacceptable for Xpert Xpress SARS-CoV-2/FLU/RSV testing.  Fact Sheet for Patients: bloggercourse.com  Fact Sheet for Healthcare Providers: seriousbroker.it  This test is not yet approved or cleared by the [place]  FDA and has been authorized for detection and/or diagnosis of SARS-CoV-2 by FDA under an Emergency Use Authorization (EUA). This EUA will remain in effect (meaning this test can be used) for the duration of the COVID-19 declaration under Section 564(b)(1) of the Act, 21 U.S.C. section 360bbb-3(b)(1), unless the authorization is terminated or revoked.     Resp Syncytial Virus by PCR NEGATIVE NEGATIVE Final    Comment: (NOTE) Fact Sheet for Patients: bloggercourse.com  Fact Sheet for Healthcare Providers: seriousbroker.it  This test is not yet approved or cleared by the [place]  FDA and has been authorized for detection and/or diagnosis of SARS-CoV-2 by FDA under an Emergency Use Authorization (EUA). This EUA will remain in effect (meaning this test can be used) for the duration of the COVID-19 declaration under Section 564(b)(1) of the Act,  21 U.S.C. section 360bbb-3(b)(1), unless the authorization is terminated or revoked.  Performed at [facility], [address], [place], [place] [address]     Time coordinating discharge: 45 minutes  Signed: Tahji Sherman  [facility] 12/17/2023, 10:55 AM

## 2023-12-17 NOTE — Plan of Care (Signed)
  Problem: Education: Goal: Understanding of cardiac disease, CV risk reduction, and recovery process will improve Outcome: Progressing Goal: Individualized Educational Video(s) Outcome: Progressing   Problem: Activity: Goal: Ability to tolerate increased activity will improve Outcome: Progressing   Problem: Cardiac: Goal: Ability to achieve and maintain adequate cardiovascular perfusion will improve Outcome: Progressing   Problem: Health Behavior/Discharge Planning: Goal: Ability to safely manage health-related needs after discharge will improve Outcome: Progressing   Problem: Education: Goal: Knowledge of General Education information will improve Description: Including pain rating scale, medication(s)/side effects and non-pharmacologic comfort measures Outcome: Progressing   Problem: Health Behavior/Discharge Planning: Goal: Ability to manage health-related needs will improve Outcome: Progressing   Problem: Clinical Measurements: Goal: Ability to maintain clinical measurements within normal limits will improve Outcome: Progressing Goal: Will remain free from infection Outcome: Progressing Goal: Diagnostic test results will improve Outcome: Progressing Goal: Respiratory complications will improve Outcome: Progressing Goal: Cardiovascular complication will be avoided Outcome: Progressing   Problem: Activity: Goal: Risk for activity intolerance will decrease Outcome: Progressing   Problem: Nutrition: Goal: Adequate nutrition will be maintained Outcome: Progressing   Problem: Coping: Goal: Level of anxiety will decrease Outcome: Progressing   Problem: Elimination: Goal: Will not experience complications related to bowel motility Outcome: Progressing Goal: Will not experience complications related to urinary retention Outcome: Progressing   Problem: Pain Management: Goal: General experience of comfort will improve Outcome: Progressing   Problem:  Safety: Goal: Ability to remain free from injury will improve Outcome: Progressing   Problem: Skin Integrity: Goal: Risk for impaired skin integrity will decrease Outcome: Progressing   Problem: Education: Goal: Understanding of CV disease, CV risk reduction, and recovery process will improve Outcome: Progressing Goal: Individualized Educational Video(s) Outcome: Progressing   Problem: Activity: Goal: Ability to return to baseline activity level will improve Outcome: Progressing   Problem: Cardiovascular: Goal: Ability to achieve and maintain adequate cardiovascular perfusion will improve Outcome: Progressing Goal: Vascular access site(s) Level 0-1 will be maintained Outcome: Progressing   Problem: Health Behavior/Discharge Planning: Goal: Ability to safely manage health-related needs after discharge will improve Outcome: Progressing

## 2023-12-17 NOTE — Consult Note (Signed)
 [facility] Liaison Consult Note   12/17/2023  Sherry Zimmerman [birth year] [id number]  [facility] Patient: Active with [facility] RN Care Coordinator  Primary Care Provider:  Jodie Lavern LITTIE, MD with [facility], this provider is listed to provide the community transition of care follow up and [facility] calls  Insurance:[employer] Medicare  Patient is reviewed for less than 30 days readmission with medium risk score and currently active with [facility] for care coordination services.  Patient has been engaged by a Therapist, Nutritional.  The community based plan of care has focused on disease management and community resource support.    Patient will receive a post hospital call and will be evaluated for assessments and disease process education.    Plan: Continue to follow for any additional community care coordination needs for post hospital/community needs. Patient home with oxygen [employer] noted and will update Nurse, [facility]. Anticipate patient to receive post hospital Community TOC follow up calls , as well.  Of note, [facility] services does not replace or interfere with any services that are needed or arranged by inpatient [facility] care management team.   Richerd Fish, RN, BSN, CCM [place]  [facility] Liaison Direct Dial: [phone] or secure chat Email: [email or web address]          '

## 2023-12-17 NOTE — Care Management Important Message (Signed)
 Important Message  Patient Details  Name: Sherry Zimmerman MRN: [id number] Date of Birth: 1956/11/12   Important Message Given:  Yes - Medicare IM   Patient left prior to IM delivery will mail copy to the patient home address  Claretta Deed 12/17/2023, 11:59 AM

## 2023-12-17 NOTE — TOC Transition Note (Signed)
 Transition of Care [facility]) - Discharge Note   Patient Details  Name: Sherry Zimmerman MRN: [id number] Date of Birth: 09-02-1956  Transition of Care [facility]) CM/SW Contact:  Roxie KANDICE Stain, RN Phone Number: 12/17/2023, 12:44 PM   Clinical Narrative:    Patient stable for discharge.  [employer] provided Neb and oxygen DME for home.  No other needs at this time.   Final next level of care: Home/Self Care Barriers to Discharge: Barriers Resolved   Patient Goals and CMS Choice Patient states their goals for this hospitalization and ongoing recovery are:: return home   Choice offered to / list presented to : NA      Discharge Placement             home          Discharge Plan and Services Additional resources added to the After Visit Summary for   In-house Referral: NA Discharge Planning Services: CM Consult Post Acute Care Choice: NA          DME Arranged: Oxygen DME Agency: [employer] Date DME Agency Contacted: 12/16/23 Time DME Agency Contacted: 1530 Representative spoke with at DME Agency: [place] [facility] Arranged: NA          Social Drivers of Health (SDOH) Interventions SDOH Screenings   Food Insecurity: No Food Insecurity (12/13/2023)  Housing: Low Risk  (12/13/2023)  Transportation Needs: No Transportation Needs (12/13/2023)  Utilities: Not At Risk (12/13/2023)  Alcohol Screen: Low Risk  (10/01/2023)  Depression (PHQ2-9): Low Risk  (08/25/2023)  Financial Resource Strain: Low Risk  (10/01/2023)  Physical Activity: Inactive (10/01/2023)  Social Connections: Moderately Integrated (12/13/2023)  Stress: No Stress Concern Present (10/01/2023)  Tobacco Use: Medium Risk (12/13/2023)  Health Literacy: Adequate Health Literacy (08/25/2023)     Readmission Risk Interventions    07/01/2023    2:32 PM  Readmission Risk Prevention Plan  Post Dischage Appt Complete  Medication Screening Complete  Transportation Screening Complete

## 2023-12-17 NOTE — Telephone Encounter (Signed)
 Patient contacted regarding discharge from [facility] on 12/17/2023.  Patient understands to follow up with provider Swinyer on 41/23/25  at 10:05  at [facility] street Patient understands discharge instructions? Yes Patient understands medications and regiment? {Yes Patient understands to bring all medications to this visit? Yes  Ask patient:  Are you enrolled in My Chart Yes   Spoke with Koren Moose per DPR and he stated patient just got home from the hospital and asleep. He answered all questions

## 2023-12-18 ENCOUNTER — Telehealth: Payer: Self-pay

## 2023-12-18 ENCOUNTER — Encounter: Payer: Self-pay | Admitting: Gastroenterology

## 2023-12-18 ENCOUNTER — Other Ambulatory Visit: Payer: Self-pay | Admitting: Family Medicine

## 2023-12-18 MED ORDER — ALBUTEROL SULFATE (2.5 MG/3ML) 0.083% IN NEBU: 2.5000 mg | INHALATION_SOLUTION | Freq: Four times a day (QID) | RESPIRATORY_TRACT | 1 refills | Status: AC | PRN

## 2023-12-18 NOTE — Transitions of Care (Post Inpatient/ED Visit) (Signed)
 12/18/2023  Name: Sherry Zimmerman MRN: [id number] DOB: May 20, 1956  Today's TOC FU Call Status: Today's TOC FU Call Status:: Successful TOC FU Call Completed TOC FU Call Complete Date: 12/18/23 Patient's Name and Date of Birth confirmed.  Transition Care Management Follow-up Telephone Call Date of Discharge: 12/17/23 Discharge Facility: [facility]) Type of Discharge: Inpatient Admission Primary Inpatient Discharge Diagnosis:: NSTEMI How have you been since you were released from the hospital?: Better (pt voices she rested well last night-appetite decreased- she is having some coughing spells at times-reports dry cough-taking Tessalon  to help 3x/day, wearing oxygen a 4L/mn via [place] cont. -denies any SOB) Any questions or concerns?: Yes Patient Questions/Concerns:: Sig other state that they were given a neb machine but does not know what he is suposed to put in there as they were nto given anything Patient Questions/Concerns Addressed: Notified Provider of Patient Questions/Concerns (RN CM sent secure chat mesage to PCP to inquire after noting on d/c instructions no info privided as well as unable to find any info from inpt note other than DME was ordered)  Items Reviewed: Did you receive and understand the discharge instructions provided?: Yes Medications obtained,verified, and reconciled?: Yes (Medications Reviewed) Any new allergies since your discharge?: No Dietary orders reviewed?: Yes Type of Diet Ordered:: low salt//heart healthy Do you have support at home?: Yes People in Home: significant other Name of Support/Comfort Primary Source: Sherry Zimmerman  Medications Reviewed Today: Medications Reviewed Today     Reviewed by Rochel Rama Loose, RN (Registered Nurse) on 12/18/23 at [phone]  Med List Status: <None>   Medication Order Taking? Sig Documenting Provider Last Dose Status Informant  albuterol  (VENTOLIN  HFA) 108 (90 Base) MCG/ACT inhaler [id number]  Inhale 1-2 puffs  into the lungs every 4 (four) hours as needed for wheezing or shortness of breath. Sherry Celinda Balo, MD  Active Self, Pharmacy Records           Med Note [facility], RAMA J   Fri Dec 18, 2023  9:58 AM) Marcheta other unable to locate inhaler in home-will call pharmacy to get refill  alendronate  (FOSAMAX ) 70 MG tablet [id number] Yes Take 1 tablet (70 mg total) by mouth every 7 (seven) days. Take with a full glass of water on an empty stomach. Sherry Lavern LITTIE, MD Taking Active Self, Pharmacy Records  aspirin  EC 81 MG tablet [id number] Yes Take 1 tablet (81 mg total) by mouth daily. Swallow whole. Arlice Reichert, MD 12/18/2023 Morning Active   atorvastatin  (LIPITOR ) 80 MG tablet [id number] Yes Take 1 tablet (80 mg total) by mouth daily. Arlice Reichert, MD 12/17/2023 Active   benzonatate  (TESSALON ) 200 MG capsule [id number] Yes Take 1 capsule (200 mg total) by mouth 3 (three) times daily as needed for cough. Arlice Reichert, MD 12/18/2023 Morning Active   calcium -vitamin D  (OSCAL WITH D) 500-5 MG-MCG tablet [id number] Yes Take 1 tablet by mouth daily with breakfast. Sherry Lavern LITTIE, MD 12/18/2023 Morning Active Self, Pharmacy Records  cyanocobalamin  (VITAMIN B12) 500 MCG tablet [id number] Yes Take 500 mcg by mouth daily. [provider] 12/18/2023 Morning Active Self, Pharmacy Records  doxycycline  (VIBRA -TABS) 100 MG tablet [id number] Yes Take 1 tablet (100 mg total) by mouth every 12 (twelve) hours for 3 days. Arlice Reichert, MD 12/18/2023 Morning Active   ferrous sulfate  325 (65 FE) MG EC tablet [id number] Yes Take 325 mg by mouth daily. [provider] 12/18/2023 Morning Active Self, Pharmacy Records  furosemide  (LASIX ) 20 MG tablet [id number] Yes Take 1 tablet (20  mg total) by mouth daily. Arlice Reichert, MD 12/18/2023 Morning Active   Glycopyrrolate -Formoterol  (BEVESPI  AEROSPHERE) 9-4.8 MCG/ACT AERO [id number] Yes Inhale 2 puffs into the lungs 2 (two) times daily. Sherry Lavern CROME, MD 12/18/2023 Morning Active  Self, Pharmacy Records  metoprolol  succinate (TOPROL -XL) 25 MG 24 hr tablet [id number] Yes Take 1 tablet (25 mg total) by mouth daily. Arlice Reichert, MD 12/18/2023 Morning Active   Na Sulfate-K Sulfate-Mg Sulf 17.5-3.13-1.6 GM/177ML SOLN [id number] No Use as directed; may use generic; goodrx card if insurance will not cover generic  Patient not taking: Reported on 12/13/2023   Sherry Vina HERO, NP Not Taking Active Self, Pharmacy Records  oxybutynin  (DITROPAN ) 5 MG tablet [id number] Yes Take 1 tablet (5 mg total) by mouth 2 (two) times daily. Sherry Lavern CROME, MD 12/18/2023 Morning Active Self, Pharmacy Records  prasugrel  (EFFIENT ) 10 MG TABS tablet [id number] Yes Take 1 tablet (10 mg total) by mouth daily. Arlice Reichert, MD 12/18/2023 Morning Active   predniSONE  (DELTASONE ) 10 MG tablet [id number] Yes Take 4 tablets (40 mg total) by mouth daily for 2 days, THEN 3 tablets (30 mg total) daily for 2 days, THEN 2 tablets (20 mg total) daily for 2 days, THEN 1 tablet (10 mg total) daily for 1 day. Arlice Reichert, MD 12/18/2023 Morning Active   valACYclovir  (VALTREX ) 1000 MG tablet [id number] Yes Take 2 tablets (2,000 mg total) by mouth 2 (two) times daily. Once, as needed for cold sores Sherry Lavern CROME, MD 12/18/2023 Morning Active Self, Pharmacy Records  venlafaxine  XR (EFFEXOR -XR) 150 MG 24 hr capsule [id number] Yes TAKE 1 CAPSULE BY MOUTH AT BEDTIME **TAKE  WITH  75MG   CAPSULES  FOR  225MG   TOTAL  DAILY  DOSE  Patient taking differently: Take 150 mg by mouth at bedtime.   Sherry Lavern CROME, MD 12/17/2023 Active Self, Pharmacy Records  venlafaxine  XR (EFFEXOR -XR) 75 MG 24 hr capsule [id number] Yes TAKE 1 CAPSULE BY MOUTH ONCE DAILY **TAKE  WITH  150MG   CAPSULES  FOR  225MG   TOTAL  DAILY  DOSE  Patient taking differently: Take 75 mg by mouth at bedtime. TAKE 1 CAPSULE BY MOUTH ONCE DAILY **TAKE  WITH  150MG   CAPSULES  FOR  225MG   TOTAL  DAILY  DOSE   Sherry Lavern CROME, MD 12/17/2023 Active Self, Pharmacy Records             Home Care and Equipment/Supplies: Were Home Health Services Ordered?: No Any new equipment or medical supplies ordered?: Yes Name of Medical supply agency?: [employer]- oxygen, neb machine Were you able to get the equipment/medical supplies?: Yes Do you have any questions related to the use of the equipment/supplies?: Yes What questions do you have?: no medicine to put in neb machine- PCP notified  Functional Questionnaire: Do you need assistance with bathing/showering or dressing?: No Do you need assistance with meal preparation?: No Do you need assistance with eating?: No Do you have difficulty maintaining continence: No Do you need assistance with getting out of bed/getting out of a chair/moving?: No Do you have difficulty managing or taking your medications?: No  Follow up appointments reviewed: PCP Follow-up appointment confirmed?: Yes Date of PCP follow-up appointment?: 12/21/23 Follow-up Provider: Dr. Jodie [facility] Follow-up appointment confirmed?: Yes Date of Specialist follow-up appointment?: 12/31/23 Follow-Up Specialty Provider:: Rosaline Swinyer-cardiology, Dr. Parrett-01/14/24-lung MD Do you need transportation to your follow-up appointment?: No Do you understand care options if your condition(s) worsen?: Yes-patient verbalized understanding  SDOH Interventions Today    Flowsheet  Row Most Recent Value  SDOH Interventions   Food Insecurity Interventions Intervention Not Indicated  Housing Interventions Intervention Not Indicated  Transportation Interventions Intervention Not Indicated  Utilities Interventions Intervention Not Indicated      Interventions Today    Flowsheet Row Most Recent Value  General Interventions   General Interventions Discussed/Reviewed General Interventions Discussed, Doctor Visits, Durable Medical Equipment (DME), Communication with  Doctor Visits Discussed/Reviewed Doctor Visits Discussed, PCP, Specialist  Durable Medical  Equipment (DME) Oxygen, Other  [neb machine]  PCP/Specialist Visits Compliance with follow-up visit  Communication with RN  Education Interventions   Education Provided Provided Education  Provided Verbal Education On Nutrition, When to see the doctor, Medication, Other  Nutrition Interventions   Nutrition Discussed/Reviewed Nutrition Discussed  Pharmacy Interventions   Pharmacy Dicussed/Reviewed Pharmacy Topics Discussed, Medications and their functions  Safety Interventions   Safety Discussed/Reviewed Safety Discussed      TOC Interventions Today    Flowsheet Row Most Recent Value  TOC Interventions   TOC Interventions Discussed/Reviewed TOC Interventions Discussed, Contacted provider for patient needs, S/S of infection      Rama Pilling, RN,BSN,CCM RN Care Manager Transitions of Care  [place]  Direct Phone: [phone] Toll Free: [phone] Fax: [phone]

## 2023-12-21 ENCOUNTER — Inpatient Hospital Stay: Payer: Medicare Other | Admitting: Family Medicine

## 2023-12-21 ENCOUNTER — Ambulatory Visit: Payer: Medicare Other | Admitting: Family Medicine

## 2023-12-21 ENCOUNTER — Encounter: Payer: Self-pay | Admitting: Family Medicine

## 2023-12-21 VITALS — BP 106/60 | HR 93 | Temp 97.7°F | Ht 67.0 in | Wt 125.4 lb

## 2023-12-21 DIAGNOSIS — E782 Mixed hyperlipidemia: Secondary | ICD-10-CM | POA: Diagnosis not present

## 2023-12-21 DIAGNOSIS — I214 Non-ST elevation (NSTEMI) myocardial infarction: Secondary | ICD-10-CM

## 2023-12-21 DIAGNOSIS — Z955 Presence of coronary angioplasty implant and graft: Secondary | ICD-10-CM

## 2023-12-21 DIAGNOSIS — R0902 Hypoxemia: Secondary | ICD-10-CM | POA: Diagnosis not present

## 2023-12-21 DIAGNOSIS — Z9981 Dependence on supplemental oxygen: Secondary | ICD-10-CM

## 2023-12-21 DIAGNOSIS — I2510 Atherosclerotic heart disease of native coronary artery without angina pectoris: Secondary | ICD-10-CM | POA: Diagnosis not present

## 2023-12-21 DIAGNOSIS — J441 Chronic obstructive pulmonary disease with (acute) exacerbation: Secondary | ICD-10-CM | POA: Diagnosis not present

## 2023-12-21 DIAGNOSIS — G119 Hereditary ataxia, unspecified: Secondary | ICD-10-CM

## 2023-12-21 DIAGNOSIS — I2584 Coronary atherosclerosis due to calcified coronary lesion: Secondary | ICD-10-CM

## 2023-12-21 MED ORDER — OXYBUTYNIN CHLORIDE 5 MG PO TABS: 5.0000 mg | ORAL_TABLET | Freq: Two times a day (BID) | ORAL | 3 refills | Status: DC

## 2023-12-21 NOTE — Progress Notes (Signed)
 Subjective  CC:  Chief Complaint  Patient presents with   Hospitalization Follow-up    Discharged 12/13/2023 - 12/17/2023 (4 days) [facility]   NSTEMI (non-ST elevated myocardial infarction) [facility]) Principal problem      HPI: Sherry Zimmerman is a 68 y.o. female who presents to the office today to address the problems listed above in the chief complaint. Recommendations at discharge:  Complete 5-day course of doxycycline  at discharge.  Continue tapering course of prednisone  You have a stent placed in your heart.  Continue aspirin  81 mg daily, prasugrel  10 mg daily for minimum 12 months Lipitor  dose has been increased from 10 mg to 80 mg daily. Follow-up with cardiology as an outpatient in 4 weeks.  Brief narrative: I have reviewed hospital encounter/notes/procedures/labs Sherry Zimmerman is a 68 y.o. female with PMH significant for COPD, recently stopped smoking last 6 months, chronic anemia, Raynaud's disease, anxiety/depression, cerebellar ataxia. 1/5, patient presented to the ED with worsening shortness of breath On initial workup, troponin was elevated to over 1000 Started on heparin  drip Admitted to cardiology service Patient was also noted to be in COPD exacerbation PCCM was consulted, patient was started on BiPAP 1/7, cardiac cath showed 70 to 80% RCA stenosis, DES placed, normal left coronary artery system 1/7, TRH was consulted for medical management  Sherry Zimmerman is here with her husband.  On oxygen 4 L nasal cannula.  Over the last 48 hours, starting to improve.  Still with significant hacking cough.  No further chest tightness or wheezing.  Reviewed all discharge medications.  They are currently being prescribed appropriately.  Stamina is low.  No fevers, chills.  Tolerating her medications.  Of note, was to have colonoscopy scheduled next week but this will have to be deferred for now given recent MI.   Assessment  1. NSTEMI (non-ST elevated  myocardial infarction) (HCC)   2. Status post coronary artery stent placement   3. Coronary artery disease due to calcified coronary lesion   4. COPD with acute exacerbation (HCC)   5. Hypoxemia requiring supplemental oxygen   6. Familial cerebellar ataxia (HCC)   7. Mixed hyperlipidemia      Plan   CAD: continue current meds: asa, effient  and bb per cards. Has f/u scheduled on January 23rd.  Education given.  Monitor blood pressures. COPD exacerbation on supplemental oxygen w/ pulm f/u later this month.  Slowly improving.  Continue prednisone .  Add Mucinex  twice daily or Mucinex  DM twice daily.  Continue albuterol , changed to as needed.  Complete prednisone  taper.  No new signs of infection.  Monitor closely.  Remain cigarette abstinent. Still very winded with exertion, continue oxygen at 4 L nasal cannula. High fall risk. Will check lipid panel in 3 months on increased dose of Lipitor  80 mg nightly.  Education regarding management of these chronic disease states was given. Management strategies discussed on successive visits include dietary and exercise recommendations, goals of achieving and maintaining IBW, and lifestyle modifications aiming for adequate sleep and minimizing stressors.   Follow up: 3 months for recheck  No orders of the defined types were placed in this encounter.  No orders of the defined types were placed in this encounter.     BP Readings from Last 3 Encounters:  12/21/23 106/60  12/17/23 (!) 117/58  11/17/23 134/86   Wt Readings from Last 3 Encounters:  12/21/23 125 lb 6.4 oz (56.9 kg)  12/15/23 128 lb 12 oz (58.4 kg)  11/17/23 132  lb 12.8 oz (60.2 kg)    Lab Results  Component Value Date   CHOL 131 12/14/2023   CHOL 171 10/07/2023   CHOL 130 09/19/2022   Lab Results  Component Value Date   HDL 48 12/14/2023   HDL 63.50 10/07/2023   HDL 54.40 09/19/2022   Lab Results  Component Value Date   LDLCALC 64 12/14/2023   LDLCALC 74 10/07/2023    LDLCALC 51 09/19/2022   Lab Results  Component Value Date   TRIG 95 12/14/2023   TRIG 167.0 (H) 10/07/2023   TRIG 119.0 09/19/2022   Lab Results  Component Value Date   CHOLHDL 2.7 12/14/2023   CHOLHDL 3 10/07/2023   CHOLHDL 2 09/19/2022   No results found for: LDLDIRECT Lab Results  Component Value Date   CREATININE 1.00 12/16/2023   BUN 34 (H) 12/16/2023   NA 136 12/16/2023   K 4.0 12/16/2023   CL 99 12/16/2023   CO2 23 12/16/2023    The ASCVD Risk score (Arnett DK, et al., 2019) failed to calculate for the following reasons:   Risk score cannot be calculated because patient has a medical history suggesting prior/existing ASCVD  I reviewed the patients updated PMH, FH, and SocHx.    Patient Active Problem List   Diagnosis Date Noted   Status post coronary artery stent placement 12/16/2023    Priority: High   NSTEMI (non-ST elevated myocardial infarction) (HCC) 12/13/2023    Priority: High   Coronary artery disease due to calcified coronary lesion 12/13/2023    Priority: High   Atherosclerosis of aorta (HCC) 12/13/2023    Priority: High   Osteoporosis 07/08/2021    Priority: High   Former smoker 07/08/2021    Priority: High   Mixed hyperlipidemia 11/18/2019    Priority: High   Major depression, recurrent, chronic (HCC) 08/13/2018    Priority: High   Emphysema of lung (HCC) 05/13/2018    Priority: High   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    Priority: High   Migraine without aura and without status migrainosus, not intractable 01/10/2019    Priority: Medium    Acquired iron deficiency anemia due to decreased absorption 06/18/2018    Priority: Medium    OAB (overactive bladder) 11/09/2014    Priority: Medium    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 Her smoking use included cigarettes. She started smoking about 2 years ago. She has a 50 pack-year smoking history. She has never used smokeless tobacco. She reports that she does not currently use alcohol. She reports that she does not use drugs.  No outpatient medications have been marked as taking for the 12/21/23 encounter (Office Visit) with Jodie Lavern CROME, MD.    Review of Systems: Cardiovascular: negative for chest pain, palpitations, leg swelling, orthopnea Respiratory: negative for SOB, wheezing or persistent cough Gastrointestinal: negative for abdominal pain Genitourinary: negative for dysuria or gross hematuria  Objective  Vitals: BP 106/60   Pulse 93   Temp 97.7 F (36.5 C)   Ht 5' 7 (1.702 m)   Wt 125 lb 6.4 oz (56.9 kg)  SpO2 96%   BMI 19.64 kg/m  General: no acute distress, sitting in wheelchair, frail appearing, increased work of breathing with talking at times, coughing Psych:  Alert and oriented, normal mood and affect HEENT:  Normocephalic, atraumatic, supple neck  Cardiovascular:  RRR without murmur. no edema Respiratory: Fair breath sounds bilaterally, no wheezing or rales Neurologic:   Mental status is normal Commons side effects, risks, benefits, and alternatives for medications and treatment plan prescribed today were discussed, and the patient expressed understanding of the given instructions. Patient is instructed to call or message via MyChart if he/she has any questions or concerns regarding our treatment plan. No barriers to understanding were identified. We discussed Red Flag symptoms and signs in detail. Patient expressed understanding regarding what to do in case of urgent or emergency type symptoms.  Medication list was reconciled, printed and provided to the patient in AVS. Patient  instructions and summary information was reviewed with the patient as documented in the AVS. This note was prepared with assistance of Dragon voice recognition software. Occasional wrong-word or sound-a-like substitutions may have occurred due to the inherent limitation

## 2023-12-21 NOTE — Patient Instructions (Signed)
Please return in 3 months  ? ?If you have any questions or concerns, please don't hesitate to send me a message via MyChart or call the office at [phone]. Thank you for visiting with [place] today! It's our pleasure caring for you.  ?

## 2023-12-22 ENCOUNTER — Ambulatory Visit: Payer: Self-pay

## 2023-12-22 NOTE — Patient Outreach (Signed)
  Care Coordination   Follow Up Visit Note   12/22/2023 Name: SIGNA CHEEK MRN: [id number] DOB: [birth year]  Veva LITTIE Hardage is a 68 y.o. year old female who sees Jodie Lavern LITTIE, MD for primary care. I spoke with  Veva LITTIE Lorenz by phone today.  What matters to the patients health and wellness today?  Getting over cough and recent MI    Goals Addressed             This Visit's Progress    COPD Management-Recent MI       Patient Goals/Self Care Activities: -Patient/Caregiver will take medications as prescribed   -Patient/Caregiver will attend all scheduled provider appointments -Patient/Caregiver will call provider office for new concerns or questions   -Avoid smoke and air pollution -Keep your airway clear from mucus build up  -Control your cough by drinking plenty of water -Visit your doctor on a regular basis -Practice and use pursed lip breathing for shortness of breath recovery and prevention -Self assess COPD action plan zone and make appointment with provider if you have been in the yellow zone for 48 hours without improvement.   Care Coordination Interventions: Evaluation of current treatment plan related to NSTEMI and patient's adherence to plan as established by provider Provided education to patient re: healthy diet and remaining smoke free   Patient with recent hospitalization for NSTEMI.  Patient recovering well.  Follow up with heart doctor next week. Significant other is helping to care for patient and keeping up with appointments.  Patient does have a cough but states is better than before.  She is using steroids, nebulizer, oxygen and inhalers. Discussed signs of worsening and when to contact physician.  Seen by PCP on yesterday.  Oxygen saturation remains above 95% with oxygen at 4 liters.  No concerns.          SDOH assessments and interventions completed:  Yes     Care Coordination Interventions:  Yes, provided   Follow up plan: Follow up call  scheduled for 12/29/23    Encounter Outcome:  Patient Visit Completed   Montey Ebel J Kendle Turbin, RN, MSN RN Care Manager [facility], Population Health Direct Dial: [phone]  Fax: [phone] Website: [email or web address]

## 2023-12-22 NOTE — Patient Instructions (Signed)
 Visit Information  Thank you for taking time to visit with me today. Please don't hesitate to contact me if I can be of assistance to you.   Following are the goals we discussed today:   Goals Addressed             This Visit's Progress    COPD Management-Recent MI       Patient Goals/Self Care Activities: -Patient/Caregiver will take medications as prescribed   -Patient/Caregiver will attend all scheduled provider appointments -Patient/Caregiver will call provider office for new concerns or questions   -Avoid smoke and air pollution -Keep your airway clear from mucus build up  -Control your cough by drinking plenty of water -Visit your doctor on a regular basis -Practice and use pursed lip breathing for shortness of breath recovery and prevention -Self assess COPD action plan zone and make appointment with provider if you have been in the yellow zone for 48 hours without improvement.   Care Coordination Interventions: Evaluation of current treatment plan related to NSTEMI and patient's adherence to plan as established by provider Provided education to patient re: healthy diet and remaining smoke free   Patient with recent hospitalization for NSTEMI.  Patient recovering well.  Follow up with heart doctor next week. Significant other is helping to care for patient and keeping up with appointments.  Patient does have a cough but states is better than before.  She is using steroids, nebulizer, oxygen and inhalers. Discussed signs of worsening and when to contact physician.  Seen by PCP on yesterday.  Oxygen saturation remains above 95% with oxygen at 4 liters.  No concerns.          Our next appointment is by telephone on 12/29/23 at 130 pm  Please call the care guide team at [phone] if you need to cancel or reschedule your appointment.   If you are experiencing a Mental Health or Behavioral Health Crisis or need someone to talk to, please call the Suicide and Crisis Lifeline:  988   Patient verbalizes understanding of instructions and care plan provided today and agrees to view in MyChart. Active MyChart status and patient understanding of how to access instructions and care plan via MyChart confirmed with patient.     The patient has been provided with contact information for the care management team and has been advised to call with any health related questions or concerns.   Trey Gulbranson J Honor Frison, RN, MSN RN Care Manager [facility], Population Health Direct Dial: [phone]  Fax: [phone] Website: [email or web address]

## 2023-12-24 ENCOUNTER — Telehealth (HOSPITAL_COMMUNITY): Payer: Self-pay

## 2023-12-24 NOTE — Telephone Encounter (Signed)
Attempted to call patient in regards to Cardiac Rehab - LM on VM 

## 2023-12-24 NOTE — Telephone Encounter (Signed)
Pt insurance is active and benefits verified through [facility]. Co-pay $0.00, DED $0.00/$0.00 met, out of pocket $3,900.00/$1.78 met, co-insurance 0%. No pre-authorization required. Passport, 12/24/23 @ 10:50AM, REF#[id number]   How many CR sessions are covered? (36 visits for TCR, 72 visits for ICR)72 Is this a lifetime maximum or an annual maximum? Annual Has the member used any of these services to date? No Is there a time limit (weeks/months) on start of program and/or program completion? No     Will contact patient to see if she is interested in the Cardiac Rehab Program. If interested, patient will need to complete follow up appt. Once completed, patient will be contacted for scheduling upon review by the RN Navigator.

## 2023-12-28 ENCOUNTER — Encounter: Payer: Medicare Other | Admitting: Gastroenterology

## 2023-12-29 ENCOUNTER — Ambulatory Visit: Payer: Self-pay

## 2023-12-29 NOTE — Patient Outreach (Signed)
  Care Coordination   12/29/2023 Name: Sherry Zimmerman MRN: [id number] DOB: [birth year]   Care Coordination Outreach Attempts:  An unsuccessful outreach was attempted for an appointment today.  Follow Up Plan:  Additional outreach attempts will be made to offer the patient complex care management information and services.   Encounter Outcome:  No Answer   Care Coordination Interventions:  No, not indicated    Bary Leriche RN, MSN [facility]  [facility], [facility] RN Care Manager Direct Dial: [phone]  Fax: [phone] Website: [email or web address]

## 2023-12-30 NOTE — Progress Notes (Unsigned)
Cardiology Office Note:  .   Date:  12/31/2023  ID:  Sherry Zimmerman, DOB [birth year], MRN [id number] PCP: Willow Ora, MD  [facility] Providers Cardiologist:  Tessa Lerner, DO    Patient Profile: .      PMH Coronary artery disease Former tobacco abuse Quit October 2024 COPD Chronic anemia Hyperlipidemia Raynaud's disease Anxiety Depression Cerebellar ataxia Elevated lipoprotein a  She presented to [facility] ED 12/13/2023 with shortness of breath, nausea. She was hospitalized for COPD exacerbation early December 2024.  She had normal troponins at that time. She reported for the past 2 days she had been persistently short of breath and coughing.  She also had a sore throat and an creasing weakness.  Denies home O2 however was found to be hypoxic in the field and was placed on 4L via nasal cannula upon arrival.  She had been mostly laying in the bed the previous 2 days.  She reported she currently does not smoke.  Initial troponin was 465 >> 1005 and cardiology was consulted.  EKG revealed sinus tachycardia at 109 bpm with minimal anterior lateral ST depression.  CTA was negative for PE but did note coronary artery calcification.  Echo 12/13/2023 revealed mildly reduced LVEF 40 to 45%, wall motion abnormalities including anterior septum, mid inferior lateral segment, mid inferoseptal segment, mid inferior segment and basal inferoseptal segment hypokinesis, indeterminate diastolic function, normal RV, no significant valve disease.  She underwent left heart catheterization 12/15/2023 which revealed severe single-vessel disease with 40% proximal (dominant) RCA followed by tandem 80% and 70% lesions in proximal and mid vessel just prior to large RV marginal branch, successful DES/PCI of RCA covering the 80% and 70% stenoses with single Synergy XD 3.5 mm x 20 mm stent, otherwise normal left coronary artery system with very torturous heart, LVEDP 17 to 20 mmHg.    Also noted to have COPD  exacerbation initially requiring BiPAP, managed by pulmonology. Home O2 arranged as she required 4L prior to discharge.           History of Present Illness: .   Sherry Zimmerman is a very pleasant 68 y.o. female who is here with her husband for follow-up.  She continues to have significant shortness of breath with ambulating to the bathroom with her walker. She is now requiring full-time oxygen. Her husband walks behind her to ensure that she does not fall due to history of cerebellar ataxia, she also uses a wheelchair at times. She denies chest pain. States she never had chest pain even prior to being transported to the hospital on 1/5.    Discussed the use of AI scribe software for clinical note transcription with the patient, who gave verbal consent to proceed.   ROS: See HPI       Studies Reviewed: Marland Kitchen   EKG Interpretation Date/Time:  Thursday December 31 2023 10:40:18 EST Ventricular Rate:  81 PR Interval:  152 QRS Duration:  80 QT Interval:  396 QTC Calculation: 460 R Axis:   45  Text Interpretation: Normal sinus rhythm ST & T wave abnormality, consider inferior ischemia ST & T wave abnormality, consider anterolateral ischemia When compared with ECG of 16-Dec-2023 06:43, T wave inversion now evident in Inferior leads T wave inversion now evident in Anterolateral leads Confirmed by Eligha Bridegroom [phone]) on 12/31/2023 10:50:25 AM     Risk Assessment/Calculations:             Physical Exam:   VS:  BP 110/62 (BP  Location: Right Arm, Patient Position: Sitting, Cuff Size: Normal)   Ht 5\' 7"  (1.702 m)   Wt 132 lb (59.9 kg)   SpO2 (!) 89% Comment: On 4 L OXYGEN  BMI 20.67 kg/m    Wt Readings from Last 3 Encounters:  12/31/23 132 lb (59.9 kg)  12/21/23 125 lb 6.4 oz (56.9 kg)  12/15/23 128 lb 12 oz (58.4 kg)    GEN: Well nourished, well developed in no acute distress NECK: No JVD; No carotid bruits CARDIAC: RRR, no murmurs, rubs, gallops RESPIRATORY:  Clear to auscultation  without rales, wheezing or rhonchi  ABDOMEN: Soft, non-tender, non-distended EXTREMITIES:  No edema; No deformity     ASSESSMENT AND PLAN: .    Dyspnea on exertion/Chronic respiratory failure/Emphysema: Significant dyspnea on exertion since hospitalization. Now on 4L oxygen. No orthopnea or PND. She gets very "winded" with minimal exertion. She denies weight gain, orthopnea, PND, edema. She appears euvolemic on exam. Mildly reduced LVEF 40 to 45%, indeterminate diastolic parameters, no significant valve disease on echo 12/13/2023.  Advised that she could try increasing Lasix 20 mg to 40 mg in the morning for two days to see if it improves symptoms. If improvement is noted, patient to call the office and if no improvement, return to Lasix 20 mg daily. She sees pulmonology in 2 weeks, recommend further discussion regarding symptom management at that time.   CAD S/p NSTEMI: She presented with hypoxia and troponin elevation and underwent LHC 12/15/2023 which revealed near single-vessel disease with 40% proximal (dominant) RCA followed by tandem 80% and 70% lesions in proximal to mid vessel just prior to large RV marginal branch successfully treated with PCI/DES x 1 (3.5 x 20 mm).  She reports she did not have any chest pain prior to catheterization but she did have worsening shortness of breath.  She continues to have DOE which is likely multifactorial in the setting of chronic emphysema and respiratory failure with hypoxia. No indication for further ischemia evaluation at this time. GDMT for angina is limited by hypotension.  She is not a candidate for cardiac rehab due to inability to ambulate. She has some mild bruising but no significant bleeding concerns. We will continue metoprolol, atorvastatin, aspirin, and Effient. We are trying a higher dose of Lasix for 2 days to see if there is improvement in DOE. We will recheck CBC to ensure improvement since there was a slight decline in hgb at time of discharge.    Hyperlipidemia LDL goal < 70: Lipid panel completed 12/14/23 with total cholesterol 131, triglycerides 95, HDL 48, LDL 64. Continue atorvastatin.   Hypertension: BP is soft.  They do not monitor at home.  I have encouraged them to monitor routinely, particularly in the setting of increasing diuretic therapy.  Cerebellar ataxia: History of familial cerebellar ataxia. She ambulates with a walker for short distances, is having increased DOE. Husband is providing support to prevent falls. Encouraged continue fall prevention practices.     Cardiac Rehabilitation Eligibility Assessment         Disposition: 3 months with Dr. Odis Hollingshead  Signed, Eligha Bridegroom, NP-C

## 2023-12-31 ENCOUNTER — Ambulatory Visit: Payer: Medicare Other | Attending: Nurse Practitioner | Admitting: Nurse Practitioner

## 2023-12-31 ENCOUNTER — Other Ambulatory Visit: Payer: Self-pay | Admitting: *Deleted

## 2023-12-31 ENCOUNTER — Telehealth: Payer: Self-pay | Admitting: Family Medicine

## 2023-12-31 ENCOUNTER — Other Ambulatory Visit (HOSPITAL_BASED_OUTPATIENT_CLINIC_OR_DEPARTMENT_OTHER): Payer: Self-pay | Admitting: Nurse Practitioner

## 2023-12-31 ENCOUNTER — Encounter: Payer: Self-pay | Admitting: Nurse Practitioner

## 2023-12-31 VITALS — BP 110/62 | Ht 67.0 in | Wt 132.0 lb

## 2023-12-31 DIAGNOSIS — G119 Hereditary ataxia, unspecified: Secondary | ICD-10-CM

## 2023-12-31 DIAGNOSIS — I700 Atherosclerosis of aorta: Secondary | ICD-10-CM | POA: Diagnosis not present

## 2023-12-31 DIAGNOSIS — J431 Panlobular emphysema: Secondary | ICD-10-CM | POA: Diagnosis not present

## 2023-12-31 DIAGNOSIS — Z955 Presence of coronary angioplasty implant and graft: Secondary | ICD-10-CM | POA: Diagnosis not present

## 2023-12-31 DIAGNOSIS — E785 Hyperlipidemia, unspecified: Secondary | ICD-10-CM | POA: Diagnosis not present

## 2023-12-31 DIAGNOSIS — I2510 Atherosclerotic heart disease of native coronary artery without angina pectoris: Secondary | ICD-10-CM | POA: Diagnosis not present

## 2023-12-31 DIAGNOSIS — J9611 Chronic respiratory failure with hypoxia: Secondary | ICD-10-CM

## 2023-12-31 DIAGNOSIS — I2584 Coronary atherosclerosis due to calcified coronary lesion: Secondary | ICD-10-CM | POA: Diagnosis not present

## 2023-12-31 LAB — CBC
Hematocrit: 35.1 % (ref 34.0–46.6)
Hemoglobin: 10.9 g/dL — ABNORMAL LOW (ref 11.1–15.9)
MCH: 26 pg — ABNORMAL LOW (ref 26.6–33.0)
MCHC: 31.1 g/dL — ABNORMAL LOW (ref 31.5–35.7)
MCV: 84 fL (ref 79–97)
Platelets: 793 10*3/uL — ABNORMAL HIGH (ref 150–450)
RBC: 4.19 x10E6/uL (ref 3.77–5.28)
RDW: 16.8 % — ABNORMAL HIGH (ref 11.7–15.4)
WBC: 13.7 10*3/uL — ABNORMAL HIGH (ref 3.4–10.8)

## 2023-12-31 NOTE — Telephone Encounter (Unsigned)
Copied from CRM [id number]. Topic: General - Other >> Dec 31, 2023  2:45 PM Truddie Crumble wrote: Reason for CRM: pt husband called wanting to follow-up on an oxygen order that the pt need. The pt husband also stated that the pt is running out of oxygen.   [facility] per [place] ph: [phone] fax [phone] needs prescription and progress notes for oxygen.

## 2023-12-31 NOTE — Patient Instructions (Signed)
Medication Instructions:   You can try two (2) of your ( 20 mg) tablets at the same time X 2 days. If this helps go back to one (1) tablet by mouth ( 20 mg) daily. If you don't notice any significant changes call office at [phone] or send mychart message.   *If you need a refill on your cardiac medications before your next appointment, please call your pharmacy*   Lab Work:  TODAY!!!!! CBC  If you have labs (blood work) drawn today and your tests are completely normal, you will receive your results only by: MyChart Message (if you have MyChart) OR A paper copy in the mail If you have any lab test that is abnormal or we need to change your treatment, we will call you to review the results.   Testing/Procedures:  None ordered.   Follow-Up: At [facility], you and your health needs are our priority.  As part of our continuing mission to provide you with exceptional heart care, we have created designated Provider Care Teams.  These Care Teams include your primary Cardiologist (physician) and Advanced Practice Providers (APPs -  Physician Assistants and Nurse Practitioners) who all work together to provide you with the care you need, when you need it.  We recommend signing up for the patient portal called "MyChart".  Sign up information is provided on this After Visit Summary.  MyChart is used to connect with patients for Virtual Visits (Telemedicine).  Patients are able to view lab/test results, encounter notes, upcoming appointments, etc.  Non-urgent messages can be sent to your provider as well.   To learn more about what you can do with MyChart, go to [email or web address].    Your next appointment:   3 month(s)  Provider:   Dr. Odis Hollingshead    Other Instructions    1st Floor: - Lobby - Registration  - Pharmacy  - Lab - Cafe  2nd Floor: - PV Lab - Diagnostic Testing (echo, CT, nuclear med)  3rd Floor: - Vacant  4th Floor: - TCTS (cardiothoracic  surgery) - AFib Clinic - Structural Heart Clinic - Vascular Surgery  - Vascular Ultrasound  5th Floor: - HeartCare Cardiology (general and EP) - Clinical Pharmacy for coumadin, hypertension, lipid, weight-loss medications, and med management appointments    Valet parking services will be available as well.

## 2024-01-01 NOTE — Telephone Encounter (Signed)
Please call to give verbal orders for Oxygen via nasal cannula 4L to keep sats > 88% for chronic respiratory failure. Can send my last note and cardiology note from yesterday.   thanks

## 2024-01-01 NOTE — Telephone Encounter (Signed)
Rx script, OV notes and notes from cardiology has been faxed

## 2024-01-05 ENCOUNTER — Other Ambulatory Visit: Payer: Self-pay

## 2024-01-06 ENCOUNTER — Inpatient Hospital Stay: Payer: Medicare Other | Admitting: Medical Oncology

## 2024-01-06 ENCOUNTER — Other Ambulatory Visit: Payer: Medicare Other

## 2024-01-13 ENCOUNTER — Encounter: Payer: Self-pay | Admitting: Family Medicine

## 2024-01-13 NOTE — Telephone Encounter (Signed)
 Rx sent

## 2024-01-14 ENCOUNTER — Encounter: Payer: Self-pay | Admitting: Family Medicine

## 2024-01-14 ENCOUNTER — Other Ambulatory Visit: Payer: Self-pay

## 2024-01-14 ENCOUNTER — Encounter: Payer: Self-pay | Admitting: Adult Health

## 2024-01-14 ENCOUNTER — Other Ambulatory Visit (HOSPITAL_COMMUNITY): Payer: Self-pay

## 2024-01-14 ENCOUNTER — Ambulatory Visit: Payer: Medicare Other | Admitting: Adult Health

## 2024-01-14 ENCOUNTER — Other Ambulatory Visit: Payer: Self-pay | Admitting: Family Medicine

## 2024-01-14 ENCOUNTER — Telehealth: Payer: Self-pay

## 2024-01-14 VITALS — BP 100/60 | HR 84 | Temp 97.3°F | Ht 67.0 in | Wt 128.4 lb

## 2024-01-14 DIAGNOSIS — G119 Hereditary ataxia, unspecified: Secondary | ICD-10-CM | POA: Diagnosis not present

## 2024-01-14 DIAGNOSIS — J441 Chronic obstructive pulmonary disease with (acute) exacerbation: Secondary | ICD-10-CM

## 2024-01-14 DIAGNOSIS — R911 Solitary pulmonary nodule: Secondary | ICD-10-CM

## 2024-01-14 DIAGNOSIS — J9611 Chronic respiratory failure with hypoxia: Secondary | ICD-10-CM

## 2024-01-14 MED ORDER — BREZTRI AEROSPHERE 160-9-4.8 MCG/ACT IN AERO: 2.0000 | INHALATION_SPRAY | Freq: Two times a day (BID) | RESPIRATORY_TRACT | Status: DC

## 2024-01-14 MED ORDER — BREZTRI AEROSPHERE 160-9-4.8 MCG/ACT IN AERO: 2.0000 | INHALATION_SPRAY | Freq: Two times a day (BID) | RESPIRATORY_TRACT | 5 refills | Status: AC

## 2024-01-14 NOTE — Patient Outreach (Signed)
  Care Coordination   01/14/2024 Name: Sherry Zimmerman MRN: [id number] DOB: December 28, 1955   Care Coordination Outreach Attempts:  A second unsuccessful outreach was attempted today to offer the patient with information about available complex care management services.  Follow Up Plan:  Additional outreach attempts will be made to offer the patient complex care management information and services.   Encounter Outcome:  No Answer   Care Coordination Interventions:  No, not indicated    [name] RN, MSN [facility]  [facility], [facility] RN Care Manager Direct Dial: [phone]  Fax: [phone] Website: [email or web address]

## 2024-01-14 NOTE — Patient Instructions (Addendum)
 Stop Bevespi   Begin Breztri  2 puffs Twice daily, rinse after use.  Albuterol  inhaler or neb As needed   Activity as tolerated  Oxygen 2l/m rest and 4l/m with activity ,goal is to keep O2 sats >90%.  Follow up with Dr. Annella in 6 weeks as planned and As needed

## 2024-01-14 NOTE — Progress Notes (Signed)
 [facility]  @Patient  ID: Sherry Zimmerman, female    DOB: 12/10/1955, 68 y.o.   MRN: [id number]  Chief Complaint  Patient presents with   Hospitalization Follow-up    Referring provider: Jodie Lavern LITTIE, MD  HPI: 68 year old female former heavy smoker followed for COPD Participates in the lung cancer CT screening program Medical history significant for Hereditary Ataxia - wheelchair/walker   TEST/EVENTS :  PFT 05/24/2018 Moderate COPD with FEV1 60 %, ratio 52, FVC 91%, no BD response, DLCO 45%  CT chest 08/11/23 4.3 mm RML , Emphsyema   CT chest 11/13/23 resolved nodule , neg PE , Emphysema   CT chest 12/13/23 neg PE , stable scarring, emphysema   01/14/2024 Follow up ; COPD, Post hospital Follow up  Patient is followed for COPD with emphysema.  She is here for a posthospital follow-up.  She was admitted in December and January for COPD exacerbations acute hypoxic respiratory failure.  CT chest angio was negative for PE.  Resolved 4 mm right middle lobe nodule.   She was treated with nebulized bronchodilators and empiric steroids.  Viral panel was negative.  She required initial BiPAP support.  Continued to have ongoing oxygen requirements and was discharged on oxygen 3 L at rest and 4 L with activity.  Last admission had a NSTEMI.  Echo showed decreased EF at 40 to 45%, right ventricular systolic function normal and normal RV size.  She did undergo a cardiac cath with DES to the RCA. She remains on Bevespi  Twice daily.  Since discharge she is doing better.  She says she still gets weak and short of breath with activities.  As above she has ataxia and uses wheelchair and walker.. Today in the office required 2 L of oxygen at rest to keep O2 saturations greater than 88 to 90% and 4 L with activity. She denies any chest pain, orthopnea, edema.         Allergies  Allergen Reactions   Morphine     Unknown reaction   Sulfa Antibiotics Nausea And Vomiting and Other (See Comments)     Made me sick    Immunization History  Administered Date(s) Administered   DTaP / HiB 11/09/2014   Fluad Quad(high Dose 65+) 10/20/2022   Fluad Trivalent(High Dose 65+) 10/07/2023   HIB (PRP-T) 11/09/2014   Influenza Split 11/19/2015   Influenza, Seasonal, Injecte, Preservative Fre 10/20/2014   Influenza,inj,Quad PF,6+ Mos 10/10/2016, 09/11/2017, 08/13/2018, 08/29/2019   Influenza-Unspecified 09/26/2017, 10/09/2020   Janssen (J&J) SARS-COV-2 Vaccination 03/15/2020   Meningococcal Conjugate 11/09/2014, 01/05/2015   Pneumococcal Conjugate-13 11/09/2014   Pneumococcal Polysaccharide-23 05/02/2015, 05/23/2020   Tdap 12/08/2008   Zoster Recombinant(Shingrix ) 08/31/2019, 12/26/2019    Past Medical History:  Diagnosis Date   Anxiety    AR (allergic rhinitis)    Cerebellar ataxia (HCC) 2010   COPD (chronic obstructive pulmonary disease) (HCC)    Depression    Iron deficiency anemia due to chronic blood loss 06/18/2018   Major depression, recurrent, chronic (HCC) 08/13/2018   Managed with effexor , chronically   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    Tobacco History: Social History   Tobacco Use  Smoking Status Former  Average packs/day: 1 pack/day for 50.0 years (50.0 ttl pk-yrs)   Types: Cigarettes   Start date: 07/2021   Quit date: 12/08/1978   Years since quitting: 45.1  Smokeless Tobacco Never  Tobacco Comments   Quit Oct 2024 01/13/2023 hfb 01/14/2024   Counseling given: Not Answered Tobacco comments: Quit Oct 2024 01/13/2023 hfb 01/14/2024   Outpatient Medications Prior to Visit  Medication Sig Dispense Refill   albuterol  (PROVENTIL ) (2.5 MG/3ML) 0.083% nebulizer solution Take 3 mLs (2.5 mg  total) by nebulization every 6 (six) hours as needed for wheezing or shortness of breath. 150 mL 1   albuterol  (VENTOLIN  HFA) 108 (90 Base) MCG/ACT inhaler Inhale 1-2 puffs into the lungs every 4 (four) hours as needed for wheezing or shortness of breath. 9 g 2   alendronate  (FOSAMAX ) 70 MG tablet Take 1 tablet (70 mg total) by mouth every 7 (seven) days. Take with a full glass of water on an empty stomach. 4 tablet 11   aspirin  EC 81 MG tablet Take 1 tablet (81 mg total) by mouth daily. Swallow whole. 30 tablet 2   atorvastatin  (LIPITOR ) 80 MG tablet Take 1 tablet (80 mg total) by mouth daily. 30 tablet 2   calcium -vitamin D  (OSCAL WITH D) 500-5 MG-MCG tablet Take 1 tablet by mouth daily with breakfast.     cyanocobalamin  (VITAMIN B12) 500 MCG tablet Take 500 mcg by mouth daily.     ferrous sulfate  325 (65 FE) MG EC tablet Take 325 mg by mouth daily.     furosemide  (LASIX ) 20 MG tablet Take 1 tablet (20 mg total) by mouth daily. 30 tablet 2   Glycopyrrolate -Formoterol  (BEVESPI  AEROSPHERE) 9-4.8 MCG/ACT AERO Inhale 2 puffs into the lungs 2 (two) times daily. 11 g 11   metoprolol  succinate (TOPROL -XL) 25 MG 24 hr tablet Take 1 tablet (25 mg total) by mouth daily. 30 tablet 2   oxybutynin  (DITROPAN ) 5 MG tablet Take 1 tablet (5 mg total) by mouth 2 (two) times daily. 180 tablet 3   OXYGEN Inhale 4 L into the lungs continuous.     prasugrel  (EFFIENT ) 10 MG TABS tablet Take 1 tablet (10 mg total) by mouth daily. 30 tablet 2   valACYclovir  (VALTREX ) 1000 MG tablet Take 2 tablets (2,000 mg total) by mouth 2 (two) times daily. Once, as needed for cold sores 20 tablet 2   venlafaxine  XR (EFFEXOR -XR) 150 MG 24 hr capsule TAKE 1 CAPSULE BY MOUTH AT BEDTIME **TAKE  WITH  75MG   CAPSULES  FOR  225MG   TOTAL  DAILY  DOSE (Patient taking differently: Take 150 mg by mouth at bedtime.) 90 capsule 3   venlafaxine  XR (EFFEXOR -XR) 75 MG 24 hr capsule TAKE 1 CAPSULE BY MOUTH ONCE DAILY **TAKE  WITH  150MG   CAPSULES  FOR   225MG   TOTAL  DAILY  DOSE (Patient taking differently: Take 75 mg by mouth at bedtime. TAKE 1 CAPSULE BY MOUTH ONCE DAILY **TAKE  WITH  150MG   CAPSULES  FOR  225MG   TOTAL  DAILY  DOSE) 90 capsule 3   No facility-administered medications prior to visit.     Review of Systems:   Constitutional:   No  weight loss, night sweats,  Fevers, chills,  +fatigue, or  lassitude.  HEENT:   No headaches,  Difficulty swallowing,  Tooth/dental problems, or  Sore throat,                No sneezing, itching, ear ache, nasal congestion, post nasal drip,  CV:  No chest pain,  Orthopnea, PND, swelling in lower extremities, anasarca, dizziness, palpitations, syncope.   GI  No heartburn, indigestion, abdominal pain, nausea, vomiting, diarrhea, change in bowel habits, loss of appetite, bloody stools.   Resp:  No chest wall deformity  Skin: no rash or lesions.  GU: no dysuria, change in color of urine, no urgency or frequency.  No flank pain, no hematuria   MS:  No joint pain or swelling.  No decreased range of motion.  No back pain.    Physical Exam  BP 100/60 (BP Location: Right Arm, Patient Position: Sitting, Cuff Size: Normal)   Pulse 84   Temp (!) 97.3 F (36.3 C) (Temporal)   Ht 5' 7 (1.702 m)   Wt 128 lb 6.4 oz (58.2 kg)   SpO2 96%   BMI 20.11 kg/m   GEN: A/Ox3; pleasant , NAD, wheelchair    HEENT:  [place]/AT,  NOSE-clear, THROAT-clear, no lesions, no postnasal drip or exudate noted.   NECK:  Supple w/ fair ROM; no JVD; normal carotid impulses w/o bruits; no thyromegaly or nodules palpated; no lymphadenopathy.    RESP  Clear  P & A; w/o, wheezes/ rales/ or rhonchi. no accessory muscle use, no dullness to percussion  CARD:  RRR, no m/r/g, no peripheral edema, pulses intact, no cyanosis or clubbing.  GI:   Soft & nt; nml bowel sounds; no organomegaly or masses detected.   Musco: Warm bil, no deformities or joint swelling noted.   Neuro: alert, no focal deficits noted.    Skin: Warm,  no lesions or rashes    Lab Results:  CBC    ProBNP No results found for: PROBNP  Imaging: DG CHEST PORT 1 VIEW Result Date: 12/15/2023 CLINICAL DATA:  Dyspnea EXAM: PORTABLE CHEST 1 VIEW COMPARISON:  12/14/2023 FINDINGS: Heart size and pulmonary vascularity are normal. Emphysematous changes in the lungs. Coarse interstitial changes most prominent in the left mid lung consistent with scarring or linear atelectasis. Small developing left pleural effusion. Old right rib fractures. Degenerative changes in the spine and shoulders. IMPRESSION: Emphysematous changes and linear atelectasis versus scarring in the lungs similar to prior study. New small left pleural effusion. Electronically Signed   By: Elsie Gravely M.D.   On: 12/15/2023 16:25    Administration History     None          Latest Ref Rng & Units 05/24/2018    2:49 PM  PFT Results  FVC-Pre L 3.50   FVC-Predicted Pre % 91   FVC-Post L 3.41   FVC-Predicted Post % 88   Pre FEV1/FVC % % 52   Post FEV1/FCV % % 53   FEV1-Pre L 1.82   FEV1-Predicted Pre % 61   FEV1-Post L 1.80   DLCO uncorrected ml/min/mmHg 14.24   DLCO UNC% % 45   DLVA Predicted % 59   TLC L 6.30   TLC % Predicted % 108   RV % Predicted % 136     No results found for: NITRICOXIDE      Assessment & Plan:   No problem-specific Assessment & Plan notes found for this encounter.  Assessment and Plan    Chronic Obstructive Pulmonary Disease (COPD) with Emphysema COPD with recent exacerbations has led to increased oxygen dependency. A recent hospitalization for COPD exacerbation and cardiac issues required BiPAP and oxygen therapy. Currently on 4 liters of oxygen, maintaining saturation at 90-94%. Discussed the importance of keeping saturation within this range to avoid  risks associated with high oxygen levels.  Will increase maintenance regimen to triple therapy with Breztri  A pulmonary function test is scheduled for March,   Chronic  respiratory failure.  Started on oxygen last admission.  Current oxygen requirements are 2 L at rest and 4 L with activity.  Advised patient O2 saturation goals are 88 to 90%.  Plan  Patient Instructions  Stop Bevespi   Begin Breztri  2 puffs Twice daily, rinse after use.  Albuterol  inhaler or neb As needed   Activity as tolerated  Oxygen 2l/m rest and 4l/m with activity ,goal is to keep O2 sats >90%.  Follow up with Dr. Annella in 6 weeks as planned and As needed       Recent Myocardial Infarction (MI) Following a recent MI s/p Stent. 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 This condition contributes to muscle weakness impacting respiratory function. Encourage upright activities to maintain muscle strength and monitor for falls, providing assistance as needed.  General Health Maintenance Pneumonia and flu vaccinations have been received. As a former smoker who has successfully quit,encourage continued smoking cessation.  Follow-up A pulmonary function test and follow-up with the pulmonologist are scheduled for March 18th,    I spent  41  minutes dedicated to the care of this patient on the date of this encounter to include pre-visit review of records, face-to-face time with the patient discussing conditions above, post visit ordering of testing, clinical documentation with the electronic health record, making appropriate referrals as documented, and communicating necessary findings to members of the patients care team.    Madelin Stank, NP 01/14/2024

## 2024-01-15 ENCOUNTER — Other Ambulatory Visit: Payer: Self-pay

## 2024-01-15 MED ORDER — METOPROLOL SUCCINATE ER 25 MG PO TB24: 25.0000 mg | ORAL_TABLET | Freq: Every day | ORAL | 3 refills | Status: DC

## 2024-01-15 MED ORDER — FUROSEMIDE 20 MG PO TABS: 20.0000 mg | ORAL_TABLET | Freq: Every day | ORAL | 3 refills | Status: AC

## 2024-01-18 ENCOUNTER — Telehealth: Payer: Self-pay

## 2024-01-18 ENCOUNTER — Other Ambulatory Visit: Payer: Self-pay

## 2024-01-18 ENCOUNTER — Encounter: Payer: Self-pay | Admitting: Hematology & Oncology

## 2024-01-18 ENCOUNTER — Other Ambulatory Visit (HOSPITAL_COMMUNITY): Payer: Self-pay

## 2024-01-18 MED ORDER — FERROUS SULFATE 325 (65 FE) MG PO TBEC
325.0000 mg | DELAYED_RELEASE_TABLET | Freq: Every day | ORAL | 3 refills | Status: AC
  Filled 2024-01-18: qty 100, 30d supply, fill #0

## 2024-01-18 NOTE — Patient Outreach (Signed)
  Care Coordination   Follow Up Visit Note   01/18/2024 Name: Sherry Zimmerman MRN: [id number] DOB: [birth year]  Sherry Curio Kinlaw is a 68 y.o. year old female who sees Luevenia Saha, MD for primary care. I spoke with  Sherry Zimmerman by phone today.  What matters to the patients health and wellness today?  COPD management    Goals Addressed             This Visit's Progress    COPD Management-Recent MI       Patient Goals/Self Care Activities: -Patient/Caregiver will take medications as prescribed   -Patient/Caregiver will attend all scheduled provider appointments -Patient/Caregiver will call provider office for new concerns or questions   -Avoid smoke and air pollution -Keep your airway clear from mucus build up  -Control your cough by drinking plenty of water -Visit your doctor on a regular basis -Practice and use pursed lip breathing for shortness of breath recovery and prevention -Self assess COPD action plan zone and make appointment with provider if you have been in the yellow zone for 48 hours without improvement.   Care Coordination Interventions: Evaluation of current treatment plan related to NSTEMI and patient's adherence to plan as established by provider Provided education to patient re: healthy diet and remaining smoke free   Patient reports doing well.  No issues since her MI.  She reports seeing Pulmonologist and change inhaler to Breztri .  Reiterated COPD and signs of worsening and when to contact physician.   Oxygen saturation remains above 95% with oxygen at 4 liters.  No concerns.          SDOH assessments and interventions completed:  Yes     Care Coordination Interventions:  Yes, provided   Follow up plan: Follow up call scheduled for March    Encounter Outcome:  Patient Visit Completed   Rylinn Linzy J. Johnedward Brodrick RN, MSN [facility], [facility] RN Care Manager Direct Dial: [phone]  Fax:  [phone] Website: [email or web address]

## 2024-01-18 NOTE — Telephone Encounter (Signed)
 Last OV 01/14/2024 Last refill by historical provider

## 2024-01-18 NOTE — Patient Instructions (Signed)
 Visit Information  Thank you for taking time to visit with me today. Please don't hesitate to contact me if I can be of assistance to you.   Following are the goals we discussed today:   Goals Addressed             This Visit's Progress    COPD Management-Recent MI       Patient Goals/Self Care Activities: -Patient/Caregiver will take medications as prescribed   -Patient/Caregiver will attend all scheduled provider appointments -Patient/Caregiver will call provider office for new concerns or questions   -Avoid smoke and air pollution -Keep your airway clear from mucus build up  -Control your cough by drinking plenty of water -Visit your doctor on a regular basis -Practice and use pursed lip breathing for shortness of breath recovery and prevention -Self assess COPD action plan zone and make appointment with provider if you have been in the yellow zone for 48 hours without improvement.   Care Coordination Interventions: Evaluation of current treatment plan related to NSTEMI and patient's adherence to plan as established by provider Provided education to patient re: healthy diet and remaining smoke free   Patient reports doing well.  No issues since her MI.  She reports seeing Pulmonologist and change inhaler to Breztri .  Reiterated COPD and signs of worsening and when to contact physician.   Oxygen saturation remains above 95% with oxygen at 4 liters.  No concerns.          Our next appointment is by telephone on  02/15/24 at 100 pm  Please call the care guide team at [phone] if you need to cancel or reschedule your appointment.   If you are experiencing a Mental Health or Behavioral Health Crisis or need someone to talk to, please call the Suicide and Crisis Lifeline: 988   Patient verbalizes understanding of instructions and care plan provided today and agrees to view in MyChart. Active MyChart status and patient understanding of how to access instructions and care plan  via MyChart confirmed with patient.     The patient has been provided with contact information for the care management team and has been advised to call with any health related questions or concerns.   Karon Cotterill J. Evamaria Detore RN, MSN [facility], [facility] RN Care Manager Direct Dial: [phone]  Fax: [phone] Website: [email or web address]

## 2024-02-03 ENCOUNTER — Other Ambulatory Visit: Payer: Self-pay

## 2024-02-15 ENCOUNTER — Ambulatory Visit: Payer: Self-pay

## 2024-02-15 ENCOUNTER — Telehealth: Payer: Self-pay

## 2024-02-15 NOTE — Patient Instructions (Signed)
 Visit Information  Thank you for taking time to visit with me today. Please don't hesitate to contact me if I can be of assistance to you.   Following are the goals we discussed today:   Goals Addressed             This Visit's Progress    COPD Management-Recent MI       Patient Goals/Self Care Activities: -Patient/Caregiver will take medications as prescribed   -Patient/Caregiver will attend all scheduled provider appointments -Patient/Caregiver will call provider office for new concerns or questions   -Avoid smoke and air pollution -Keep your airway clear from mucus build up  -Control your cough by drinking plenty of water -Visit your doctor on a regular basis -Practice and use pursed lip breathing for shortness of breath recovery and prevention -Self assess COPD action plan zone and make appointment with provider if you have been in the yellow zone for 48 hours without improvement.   Care Coordination Interventions: Evaluation of current treatment plan related to NSTEMI and patient's adherence to plan as established by provider Provided education to patient re: healthy diet and remaining smoke free   Patient reports doing good.  She denies problems since her MI.  She states she is getting her strength back.   Discussed her COPD.  She remains oxygen dependent at 2.5- 3 liters per patient. Reviewed COPD action plan and when to see physician. She verbalized understanding.  She has PFT scheduled for 02-24-24 and pulmonology follow up on 03/17/24.  No concerns.         Our next appointment is by telephone on 03/22/24 at 100 pm  Please call the care guide team at [phone] if you need to cancel or reschedule your appointment.   If you are experiencing a Mental Health or Behavioral Health Crisis or need someone to talk to, please call the Suicide and Crisis Lifeline: 988   Patient verbalizes understanding of instructions and care plan provided today and agrees to view in MyChart.  Active MyChart status and patient understanding of how to access instructions and care plan via MyChart confirmed with patient.     The patient has been provided with contact information for the care management team and has been advised to call with any health related questions or concerns.   Bary Leriche RN, MSN [facility], [facility] RN Care Manager Direct Dial: [phone]  Fax: [phone] Website: [email or web address]

## 2024-02-15 NOTE — Patient Outreach (Signed)
 Care Coordination   02/15/2024 Name: Sherry Zimmerman MRN: [id number] DOB: 21-Jun-1956   Care Coordination Outreach Attempts:  An unsuccessful outreach was attempted for an appointment today.  Follow Up Plan:  Additional outreach attempts will be made to offer the patient complex care management information and services.   Encounter Outcome:  No Answer   Care Coordination Interventions:  No, not indicated    Bary Leriche RN, MSN [facility]  [facility], [name] RN Care Manager Direct Dial: [phone]  Fax: [phone] Website: [email or web address]

## 2024-02-15 NOTE — Patient Outreach (Signed)
 Care Coordination   Follow Up Visit Note   02/15/2024 Name: ALEXARAE OLIVA MRN: [id number] DOB: August 29, 1956  Brand Males Laughner is a 68 y.o. year old female who sees Willow Ora, MD for primary care. I spoke with  Brand Males Forde by phone today.  What matters to the patients health and wellness today?  Getting her strength back    Goals Addressed             This Visit's Progress    COPD Management-Recent MI       Patient Goals/Self Care Activities: -Patient/Caregiver will take medications as prescribed   -Patient/Caregiver will attend all scheduled provider appointments -Patient/Caregiver will call provider office for new concerns or questions   -Avoid smoke and air pollution -Keep your airway clear from mucus build up  -Control your cough by drinking plenty of water -Visit your doctor on a regular basis -Practice and use pursed lip breathing for shortness of breath recovery and prevention -Self assess COPD action plan zone and make appointment with provider if you have been in the yellow zone for 48 hours without improvement.   Care Coordination Interventions: Evaluation of current treatment plan related to NSTEMI and patient's adherence to plan as established by provider Provided education to patient re: healthy diet and remaining smoke free   Patient reports doing good.  She denies problems since her MI.  She states she is getting her strength back.   Discussed her COPD.  She remains oxygen dependent at 2.5- 3 liters per patient. Reviewed COPD action plan and when to see physician. She verbalized understanding.  She has PFT scheduled for 02-24-24 and pulmonology follow up on 03/17/24.  No concerns.         SDOH assessments and interventions completed:  Yes     Care Coordination Interventions:  Yes, provided   Follow up plan: Follow up call scheduled for April    Encounter Outcome:  Patient Visit Completed   Bary Leriche RN, MSN [place], [facility] RN Care Manager Direct Dial: [phone]  Fax: [phone] Website: [email or web address]

## 2024-02-23 ENCOUNTER — Ambulatory Visit: Payer: Medicare Other | Admitting: Pulmonary Disease

## 2024-02-24 ENCOUNTER — Ambulatory Visit (HOSPITAL_BASED_OUTPATIENT_CLINIC_OR_DEPARTMENT_OTHER): Payer: Medicare Other | Admitting: Pulmonary Disease

## 2024-02-24 DIAGNOSIS — R0609 Other forms of dyspnea: Secondary | ICD-10-CM

## 2024-02-24 LAB — PULMONARY FUNCTION TEST
DL/VA % pred: 59 %
DL/VA: 2.43 ml/min/mmHg/L
DLCO cor % pred: 49 %
DLCO cor: 10.78 ml/min/mmHg
DLCO unc % pred: 45 %
DLCO unc: 9.85 ml/min/mmHg
FEF 25-75 Post: 0.55 L/s
FEF 25-75 Pre: 0.4 L/s
FEF2575-%Change-Post: 36 %
FEF2575-%Pred-Post: 25 %
FEF2575-%Pred-Pre: 18 %
FEV1-%Change-Post: 18 %
FEV1-%Pred-Post: 44 %
FEV1-%Pred-Pre: 37 %
FEV1-Post: 1.16 L
FEV1-Pre: 0.98 L
FEV1FVC-%Change-Post: 7 %
FEV1FVC-%Pred-Pre: 54 %
FEV6-%Change-Post: 11 %
FEV6-%Pred-Post: 76 %
FEV6-%Pred-Pre: 68 %
FEV6-Post: 2.49 L
FEV6-Pre: 2.23 L
FEV6FVC-%Change-Post: 1 %
FEV6FVC-%Pred-Post: 100 %
FEV6FVC-%Pred-Pre: 98 %
FVC-%Change-Post: 10 %
FVC-%Pred-Post: 76 %
FVC-%Pred-Pre: 69 %
FVC-Post: 2.59 L
FVC-Pre: 2.36 L
Post FEV1/FVC ratio: 45 %
Post FEV6/FVC ratio: 96 %
Pre FEV1/FVC ratio: 42 %
Pre FEV6/FVC Ratio: 95 %
RV % pred: 150 %
RV: 3.48 L
TLC % pred: 117 %
TLC: 6.49 L

## 2024-02-24 NOTE — Patient Instructions (Signed)
 Full PFT Performed Today

## 2024-02-24 NOTE — Progress Notes (Signed)
 Full PFT Performed Today

## 2024-03-02 ENCOUNTER — Emergency Department (HOSPITAL_BASED_OUTPATIENT_CLINIC_OR_DEPARTMENT_OTHER)
Admission: EM | Admit: 2024-03-02 | Discharge: 2024-03-02 | Disposition: A | Discharge status: Home / Self Care | Attending: Emergency Medicine | Admitting: Emergency Medicine

## 2024-03-02 ENCOUNTER — Encounter (HOSPITAL_BASED_OUTPATIENT_CLINIC_OR_DEPARTMENT_OTHER): Payer: Self-pay | Admitting: Emergency Medicine

## 2024-03-02 ENCOUNTER — Emergency Department (HOSPITAL_BASED_OUTPATIENT_CLINIC_OR_DEPARTMENT_OTHER)

## 2024-03-02 ENCOUNTER — Other Ambulatory Visit: Payer: Self-pay

## 2024-03-02 DIAGNOSIS — J449 Chronic obstructive pulmonary disease, unspecified: Secondary | ICD-10-CM | POA: Diagnosis not present

## 2024-03-02 DIAGNOSIS — W1839XA Other fall on same level, initial encounter: Secondary | ICD-10-CM | POA: Insufficient documentation

## 2024-03-02 DIAGNOSIS — Z7982 Long term (current) use of aspirin: Secondary | ICD-10-CM | POA: Insufficient documentation

## 2024-03-02 DIAGNOSIS — W19XXXA Unspecified fall, initial encounter: Secondary | ICD-10-CM

## 2024-03-02 DIAGNOSIS — S6991XA Unspecified injury of right wrist, hand and finger(s), initial encounter: Secondary | ICD-10-CM | POA: Diagnosis present

## 2024-03-02 DIAGNOSIS — S63501A Unspecified sprain of right wrist, initial encounter: Secondary | ICD-10-CM | POA: Insufficient documentation

## 2024-03-02 DIAGNOSIS — Z043 Encounter for examination and observation following other accident: Secondary | ICD-10-CM | POA: Diagnosis not present

## 2024-03-02 NOTE — ED Provider Notes (Signed)
 [place] [facility] AT [facility] Provider Note   CSN: [id number] Arrival date & time: 03/02/24  1025     History  Chief Complaint  Patient presents with   Sherry Zimmerman is a 68 y.o. female.   Fall  Patient is a 68 year old female presents the ED today with right wrist pain after mechanical fall yesterday where Sherry Zimmerman lost her balance while with a walker, landing on right wrist.  Denies hitting head, LOC, dizziness, blurry vision, unilateral weakness, chest pain, shortness of breath, headache. Previous medical history of ataxia, anemia, osteoporosis, COPD, ORIF of right wrist, emphysema, Raynaud's, NSTEMI currently taking prasugrel.  Reports having history of ataxia for which PCP is currently managing, having already seen neurology with last visit being in 2020.    Home Medications Prior to Admission medications   Medication Sig Start Date End Date Taking? Authorizing Provider  albuterol (PROVENTIL) (2.5 MG/3ML) 0.083% nebulizer solution Take 3 mLs (2.5 mg total) by nebulization every 6 (six) hours as needed for wheezing or shortness of breath. 12/18/23   Willow Ora, MD  albuterol (VENTOLIN HFA) 108 (90 Base) MCG/ACT inhaler Inhale 1-2 puffs into the lungs every 4 (four) hours as needed for wheezing or shortness of breath. 07/02/23   Marinda Elk, MD  alendronate (FOSAMAX) 70 MG tablet Take 1 tablet (70 mg total) by mouth every 7 (seven) days. Take with a full glass of water on an empty stomach. 10/07/23   Willow Ora, MD  aspirin EC 81 MG tablet Take 1 tablet (81 mg total) by mouth daily. Swallow whole. 12/18/23 03/17/24  Lorin Glass, MD  atorvastatin (LIPITOR) 80 MG tablet Take 1 tablet (80 mg total) by mouth daily. 12/18/23 03/17/24  Lorin Glass, MD  Budeson-Glycopyrrol-Formoterol (BREZTRI AEROSPHERE) 160-9-4.8 MCG/ACT AERO Inhale 2 puffs into the lungs in the morning and at bedtime. 01/14/24   Parrett, Virgel Bouquet, NP   Budeson-Glycopyrrol-Formoterol (BREZTRI AEROSPHERE) 160-9-4.8 MCG/ACT AERO Inhale 2 puffs into the lungs in the morning and at bedtime. 01/14/24   Parrett, Virgel Bouquet, NP  calcium-vitamin D (OSCAL WITH D) 500-5 MG-MCG tablet Take 1 tablet by mouth daily with breakfast. 10/07/23   Willow Ora, MD  cyanocobalamin (VITAMIN B12) 500 MCG tablet Take 500 mcg by mouth daily.    [provider]  ferrous sulfate 325 (65 FE) MG EC tablet Take 1 tablet (325 mg total) by mouth daily with breakfast. 01/18/24   Willow Ora, MD  furosemide (LASIX) 20 MG tablet Take 1 tablet (20 mg total) by mouth daily. 01/15/24 04/14/24  Willow Ora, MD  metoprolol succinate (TOPROL-XL) 25 MG 24 hr tablet Take 1 tablet (25 mg total) by mouth daily. 01/15/24 04/14/24  Willow Ora, MD  oxybutynin (DITROPAN) 5 MG tablet Take 1 tablet (5 mg total) by mouth 2 (two) times daily. 12/21/23   Willow Ora, MD  OXYGEN Inhale 4 L into the lungs continuous.    [provider]  prasugrel (EFFIENT) 10 MG TABS tablet Take 1 tablet (10 mg total) by mouth daily. 12/18/23 03/17/24  Lorin Glass, MD  valACYclovir (VALTREX) 1000 MG tablet Take 2 tablets (2,000 mg total) by mouth 2 (two) times daily. Once, as needed for cold sores 10/07/23   Willow Ora, MD  venlafaxine XR (EFFEXOR-XR) 150 MG 24 hr capsule TAKE 1 CAPSULE BY MOUTH AT BEDTIME **TAKE  WITH  75MG   CAPSULES  FOR  225MG   TOTAL  DAILY  DOSE Patient taking differently: Take 150 mg by mouth at bedtime. 10/07/23   Willow Ora, MD  venlafaxine XR (EFFEXOR-XR) 75 MG 24 hr capsule TAKE 1 CAPSULE BY MOUTH ONCE DAILY **TAKE  WITH  150MG   CAPSULES  FOR  225MG   TOTAL  DAILY  DOSE Patient taking differently: Take 75 mg by mouth at bedtime. TAKE 1 CAPSULE BY MOUTH ONCE DAILY **TAKE  WITH  150MG   CAPSULES  FOR  225MG   TOTAL  DAILY  DOSE 10/07/23   Willow Ora, MD      Allergies    Morphine and Sulfa antibiotics    Review of Systems   Review of Systems   Musculoskeletal:  Positive for arthralgias.  All other systems reviewed and are negative.   Physical Exam Updated Vital Signs BP 139/79 (BP Location: Left Arm)   Pulse 95   Temp 97.8 F (36.6 C) (Oral)   Resp 16   SpO2 91%  Physical Exam Vitals and nursing note reviewed.  Constitutional:      General: Sherry Zimmerman is not in acute distress.    Appearance: Normal appearance. Sherry Zimmerman is not ill-appearing.  HENT:     Head: Normocephalic and atraumatic.  Eyes:     General: No scleral icterus.       Right eye: No discharge.        Left eye: No discharge.     Extraocular Movements: Extraocular movements intact.     Conjunctiva/sclera: Conjunctivae normal.  Cardiovascular:     Rate and Rhythm: Normal rate and regular rhythm.     Pulses: Normal pulses.     Heart sounds: Normal heart sounds. No murmur heard.    No friction rub. No gallop.  Pulmonary:     Effort: Pulmonary effort is normal. No respiratory distress.     Breath sounds: No stridor. No wheezing, rhonchi or rales.  Chest:     Chest wall: No tenderness.  Abdominal:     General: Abdomen is flat. There is no distension.     Palpations: Abdomen is soft.     Tenderness: There is no abdominal tenderness. There is no right CVA tenderness, left CVA tenderness or guarding.  Musculoskeletal:        General: Swelling, tenderness and signs of injury present. No deformity.     Cervical back: Normal range of motion and neck supple. No rigidity or tenderness.     Right lower leg: No edema.     Left lower leg: No edema.     Comments: Patient noted to have significant swelling and bruising over right palmar aspect of wrist.  Radial pulse 2+ bilaterally.  Cap refill normal.  Normal sensation across fingers and has full range of motion of fingers.  Limited range of motion at wrist limited by pain.  Skin:    General: Skin is warm and dry.     Capillary Refill: Capillary refill takes less than 2 seconds.     Coloration: Skin is not jaundiced or  pale.     Findings: Bruising present. No erythema, lesion or rash.  Neurological:     General: No focal deficit present.     Mental Status: Sherry Zimmerman is alert and oriented to person, place, and time. Mental status is at baseline.     Sensory: No sensory deficit.     Motor: No weakness.     Coordination: Coordination normal.     Gait: Gait normal.  Psychiatric:        Mood and Affect:  Mood normal.     ED Results / Procedures / Treatments   Labs (all labs ordered are listed, but only abnormal results are displayed) Labs Reviewed - No data to display  EKG None  Radiology DG Wrist Complete Right Result Date: 03/02/2024 CLINICAL DATA:  Right wrist pain after fall last night. EXAM: RIGHT WRIST - COMPLETE 3+ VIEW COMPARISON:  June 15, 2011. FINDINGS: Status post surgical internal fixation of old distal right radial fracture. Old ulnar styloid fracture is noted as well. No acute fracture or dislocation is noted. IMPRESSION: No acute abnormality seen. Electronically Signed   By: Lupita Raider M.D.   On: 03/02/2024 11:30    Procedures Procedures    Medications Ordered in ED Medications - No data to display  ED Course/ Medical Decision Making/ A&P                                 Medical Decision Making Amount and/or Complexity of Data Reviewed Radiology: ordered.   This patient is a 68 year old female who presents to the ED for concern of right wrist pain after mechanical fall yesterday.  Does not report any head injury and has not had any other complaints.  Currently on blood thinners after NSTEMI where Sherry Zimmerman was discharged on 12/17/23.  Patient is that Sherry Zimmerman tripped and was the reason for her fall.  On feel exam, patient is noted to have significant edema and bruising over the palmar aspect of right wrist with limited ROM by pain.  Strong radial pulse 2+, cap refill also normal, able to move fingers and no decrease sensation on exam.  Low suspicion for compartment syndrome.  No other  injuries noted.  X-ray does note surgical changes with no acute or maladies including dislocation and fracture.  With this in mind, believe that the swelling mostly due to being on blood thinners and having a wrist sprain.  Will provide Ace wrap as well as have her RICE at home.  Follow-up with hand surgery for further evaluation.  Provided return to ED precautions.  Patient vital signs remained stable to the course of her time here.  Will have her also follow-up with PCP for continued management of her ataxia.  Low suspicion for any other emergent processes present this time.  Believe patient safe to discharge at this time.  Patient is also expressing her desire to go home at this time.  All questions were answered.  Patient just agreement understanding of plan.  Differential diagnoses prior to evaluation: Fracture, ligamentous injury, compartment syndrome, dislocation, neurovascular compromise, CVA, metabolic disturbance  Past Medical History / Social History / Additional history: Chart reviewed. Pertinent results include:  ataxia, anemia, osteoporosis, COPD, ORIF of right wrist, emphysema, Raynaud's, NSTEMI currently taking prasugrel.   medical history of ataxia current management PCP and seen by neurology last seen in 2020.  Discharged on 12/17/2023 after NSTEMI and provided prasugrel as blood thinner.   Imaging:  X-ray of right wrist shows no acute abnormality with no fracture or dislocation  Medications / Treatment: Ace wrap of right wrist, RICE, follow-up with hand surgery.   Disposition: After consideration of the diagnostic results and the patients response to treatment, I feel that patient bit from discharge and treatment as above.   emergency department workup does not suggest an emergent condition requiring admission or immediate intervention beyond what has been performed at this time. The plan is: RICE, Tylenol and Profen  for pain, return for new or worsening symptoms, follow-up  with hand surgery. The patient is safe for discharge and has been instructed to return immediately for worsening symptoms, change in symptoms or any other concerns.  Final Clinical Impression(s) / ED Diagnoses Final diagnoses:  Fall, initial encounter  Sprain of right wrist, initial encounter    Rx / DC Orders ED Discharge Orders     None         Lunette Stands, PA-C 03/02/24 1302    Rozelle Logan, DO 03/03/24 1533

## 2024-03-02 NOTE — Discharge Instructions (Addendum)
 You were seen today for right wrist sprain.  With you being on blood thinners, it is probably causing the worsening swelling.  We will put an Ace wrap on it and keep compression on the area.  If you begin to experience any of the following including: Cold limb, numbness, tingling, hand becoming more pale, and are not able to find a pulse in the wrist, return to the ED immediately for concerns of compartment syndrome.  I have low suspicion at this present at this time.  Continue to ice, elevate, keep compressed and rest the wrist.  Will provide hand surgery information which you are to call and schedule follow-up visit to evaluate the hardware.

## 2024-03-02 NOTE — ED Triage Notes (Addendum)
 Fell last night walking with walker states lost balance hurt left arm and wrist  which is swollen  has ataxia denies hitting head,  had some vomiting this am

## 2024-03-03 ENCOUNTER — Telehealth: Payer: Self-pay

## 2024-03-03 NOTE — Patient Instructions (Signed)
 Visit Information  Thank you for taking time to visit with me today. Please don't hesitate to contact me if I can be of assistance to you.   Following are the goals we discussed today:   Goals Addressed             This Visit's Progress    COPD Management-Recent MI       Patient Goals/Self Care Activities: -Patient/Caregiver will take medications as prescribed   -Patient/Caregiver will attend all scheduled provider appointments -Patient/Caregiver will call provider office for new concerns or questions   -Avoid smoke and air pollution -Keep your airway clear from mucus build up  -Control your cough by drinking plenty of water -Visit your doctor on a regular basis -Practice and use pursed lip breathing for shortness of breath recovery and prevention -Self assess COPD action plan zone and make appointment with provider if you have been in the yellow zone for 48 hours without improvement.   Care Coordination Interventions: Evaluation of current treatment plan related to NSTEMI and patient's adherence to plan as established by provider Provided education to patient re: healthy diet and remaining smoke free   Patient reports doing okay. ED visit yesterday due to fall.  She has a sprained wrist.  She states it is wrapped and she is using ice as needed.  Discussed fall precautions.  She denies problems any new problems with her breathing.  She remains oxygen dependent at 2.5- 3 liters per patient. Reiterated COPD action plan and when to see physician. She verbalized understanding.           Our next appointment is by telephone on 03/22/24 at 100 pm  Please call the care guide team at [phone] if you need to cancel or reschedule your appointment.   If you are experiencing a Mental Health or Behavioral Health Crisis or need someone to talk to, please call the Suicide and Crisis Lifeline: 988   Patient verbalizes understanding of instructions and care plan provided today and agrees  to view in MyChart. Active MyChart status and patient understanding of how to access instructions and care plan via MyChart confirmed with patient.     The patient has been provided with contact information for the care management team and has been advised to call with any health related questions or concerns.   Bary Leriche RN, MSN [facility], [facility] RN Care Manager Direct Dial: [phone]  Fax: [phone] Website: [email or web address]

## 2024-03-08 ENCOUNTER — Other Ambulatory Visit: Payer: Self-pay | Admitting: *Deleted

## 2024-03-08 DIAGNOSIS — Z122 Encounter for screening for malignant neoplasm of respiratory organs: Secondary | ICD-10-CM

## 2024-03-08 DIAGNOSIS — Z87891 Personal history of nicotine dependence: Secondary | ICD-10-CM

## 2024-03-17 ENCOUNTER — Encounter: Payer: Self-pay | Admitting: Pulmonary Disease

## 2024-03-17 ENCOUNTER — Ambulatory Visit: Payer: Medicare Other | Admitting: Pulmonary Disease

## 2024-03-17 VITALS — BP 104/68 | HR 69 | Ht 66.0 in | Wt 126.0 lb

## 2024-03-17 DIAGNOSIS — J431 Panlobular emphysema: Secondary | ICD-10-CM

## 2024-03-17 NOTE — Patient Instructions (Signed)
 Continue all your medications  Paperwork today for Dupixent to help with eosinophils and COPD.  Will see if this helps your symptoms.  We should consider the addition of a nebulized medicine called Ohtuvayre in the future  Return to clinic in 4 months or sooner as needed with Dr. Judeth Horn

## 2024-03-17 NOTE — Progress Notes (Signed)
 @Patient  ID: Sherry Zimmerman, female    DOB: [birth year], 68 y.o.   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  Most recent pulmonary note reviewed.  Discharge summary 12/2023 reviewed.  Patient hospitalized with exacerbation 12/2023.  Treated with usual therapies and gradually improved.  Using 2 L at rest but 4 L with exertion.  Seen in follow-up in our office edema humidity.  Escalated LAMA LABA therapy to triple inhaled therapy via Breztri.  She thinks it helps a little bit.  She has questions about Dupixent sounds like a family member uses with COPD.  We discussed at length the role rationale for this as well as role and rationale for nebulized therapy Ohtuvayre.  HPI initial visit: Short of breath for about a year now.  Gradually worsening.  Worse on inclines or stairs.  No time of day when better or worse.  No position makes his better or worse.  No environmental or seasonal factors she can identify things better or worse.  Uses inhaler Bevespi and albuterol.  Both infrequently.  Not using Bevespi twice daily.  They seem to help a little bit.  No other alleviating or exacerbating factors.  Reviewed most recent chest image CT scan low-dose lung cancer screening 08/2023 that on my review interpretation shows severe emphysema with most severe in the upper lobes but significant emphysema in the middle lobe on the right and bilateral lower lobes, scattered small nodules lung RADS 3 recommended 36-month follow-up.  This was relayed to her and repeat CT scan has been ordered.  Discussed role and rationale for inhaler therapy and importance of adherence.  Discussed role and rationale for escalating inhalers in the future if needed.  Discussed role and rationale for additional testing to  help understand current state of lungs and achieve the most accurate diagnosis we can help her in the future.  Questionaires / Pulmonary Flowsheets:   ACT:      No data to display          MMRC:     No data to display          Epworth:      No data to display          Tests:   FENO:  No results found for: "NITRICOXIDE"  PFT:    Latest Ref Rng & Units 02/24/2024    1:21 PM 05/24/2018    2:49 PM  PFT Results  FVC-Pre L 2.36  3.50   FVC-Predicted Pre % 69  91   FVC-Post L 2.59  3.41   FVC-Predicted Post % 76  88   Pre FEV1/FVC % % 42  52   Post FEV1/FCV % % 45  53   FEV1-Pre L 0.98  1.82   FEV1-Predicted Pre % 37  61   FEV1-Post L 1.16  1.80   DLCO uncorrected ml/min/mmHg 9.85  14.24   DLCO UNC% % 45  45   DLCO corrected ml/min/mmHg 10.78    DLCO COR %Predicted % 49    DLVA Predicted % 59  59   TLC L 6.49  6.30   TLC % Predicted % 117  108   RV % Predicted % 150  136  Personally viewed and interpreted as severe nasal obstruction, lung volumes with air trapping, DLCO severely reduced.  Compared to 2019 obstruction now severe with stable lung volumes DLCO.  WALK:      No data to display          Imaging: Personally reviewed and as per EMR and discussion in this note DG Wrist Complete Right Result Date: 03/02/2024 CLINICAL DATA:  Right wrist pain after fall last night. EXAM: RIGHT WRIST - COMPLETE 3+ VIEW COMPARISON:  June 15, 2011. FINDINGS: Status post surgical internal fixation of old distal right radial fracture. Old ulnar styloid fracture is noted as well. No acute fracture or dislocation is noted. IMPRESSION: No acute abnormality seen. Electronically Signed   By: Lupita Raider M.D.   On: 03/02/2024 11:30    Lab Results: Personally reviewed CBC    Component Value Date/Time   WBC 13.7 (H) 12/31/2023 1222   WBC 15.8 (H) 12/17/2023 0220   RBC 4.19 12/31/2023 1222   RBC 4.27 12/17/2023 0220   HGB 10.9 (L) 12/31/2023 1222   HCT 35.1  12/31/2023 1222   PLT 793 (H) 12/31/2023 1222   MCV 84 12/31/2023 1222   MCH 26.0 (L) 12/31/2023 1222   MCH 25.3 (L) 12/17/2023 0220   MCHC 31.1 (L) 12/31/2023 1222   MCHC 31.6 12/17/2023 0220   RDW 16.8 (H) 12/31/2023 1222   LYMPHSABS 1.6 12/13/2023 0424   MONOABS 1.0 12/13/2023 0424   EOSABS 0.2 12/13/2023 0424   BASOSABS 0.1 12/13/2023 0424    BMET    Component Value Date/Time   NA 136 12/16/2023 0226   K 4.0 12/16/2023 0226   CL 99 12/16/2023 0226   CO2 23 12/16/2023 0226   GLUCOSE 98 12/16/2023 0226   BUN 34 (H) 12/16/2023 0226   CREATININE 1.00 12/16/2023 0226   CREATININE 0.80 06/18/2018 1353   CALCIUM 8.9 12/16/2023 0226   GFRNONAA >60 12/16/2023 0226   GFRNONAA >60 06/18/2018 1353   GFRAA >60 06/18/2018 1353    BNP    Component Value Date/Time   BNP 88.0 12/13/2023 0424    ProBNP No results found for: "PROBNP"  Specialty Problems       Pulmonary Problems   AR (allergic rhinitis)   Emphysema of lung (HCC)   Chest ct for lung ca screening 2021       Allergies  Allergen Reactions   Morphine     Unknown reaction   Sulfa Antibiotics Nausea And Vomiting and Other (See Comments)    "Made me sick"    Immunization History  Administered Date(s) Administered   DTaP / HiB 11/09/2014   Fluad Quad(high Dose 65+) 10/20/2022   Fluad Trivalent(High Dose 65+) 10/07/2023   HIB (PRP-T) 11/09/2014   Influenza Split 11/19/2015   Influenza, Seasonal, Injecte, Preservative Fre 10/20/2014   Influenza,inj,Quad PF,6+ Mos 10/10/2016, 09/11/2017, 08/13/2018, 08/29/2019   Influenza-Unspecified 09/26/2017, 10/09/2020   Janssen (J&J) SARS-COV-2 Vaccination 03/15/2020   Meningococcal Conjugate 11/09/2014, 01/05/2015   Pneumococcal Conjugate-13 11/09/2014   Pneumococcal Polysaccharide-23 05/02/2015, 05/23/2020   Tdap 12/08/2008   Zoster Recombinant(Shingrix) 08/31/2019, 12/26/2019    Past Medical History:  Diagnosis Date   Anxiety    AR (allergic rhinitis)     Cerebellar ataxia (HCC) 2010   COPD (chronic obstructive pulmonary disease) (HCC)    Depression    Iron deficiency anemia due to chronic blood loss 06/18/2018   Major depression, recurrent, chronic (HCC) 08/13/2018   Managed with effexor, chronically   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    Tobacco History: Social History   Tobacco Use  Smoking Status Former   Average packs/day: 1 pack/day for 50.0 years (50.0 ttl pk-yrs)   Types: Cigarettes   Start date: 07/2021   Quit date: 12/08/1978   Years since quitting: 45.3  Smokeless Tobacco Never  Tobacco Comments   Quit Oct 2024 01/13/2023 hfb 01/14/2024   Counseling given: Not Answered Tobacco comments: Quit Oct 2024 01/13/2023 hfb 01/14/2024   Continue to not smoke  Outpatient Encounter Medications as of 03/17/2024  Medication Sig   albuterol (PROVENTIL) (2.5 MG/3ML) 0.083% nebulizer solution Take 3 mLs (2.5 mg total) by nebulization every 6 (six) hours as needed for wheezing or shortness of breath.   albuterol (VENTOLIN HFA) 108 (90 Base) MCG/ACT inhaler Inhale 1-2 puffs into the lungs every 4 (four) hours as needed for wheezing or shortness of breath.   alendronate (FOSAMAX) 70 MG tablet Take 1 tablet (70 mg total) by mouth every 7 (seven) days. Take with a full glass of water on an empty stomach.   aspirin EC 81 MG tablet Take 1 tablet (81 mg total) by mouth daily. Swallow whole.   atorvastatin (LIPITOR) 80 MG tablet Take 1 tablet (80 mg total) by mouth daily.   Budeson-Glycopyrrol-Formoterol (BREZTRI AEROSPHERE) 160-9-4.8 MCG/ACT AERO Inhale 2 puffs into the lungs in the morning and at bedtime.   Budeson-Glycopyrrol-Formoterol (BREZTRI AEROSPHERE) 160-9-4.8  MCG/ACT AERO Inhale 2 puffs into the lungs in the morning and at bedtime.   calcium-vitamin D (OSCAL WITH D) 500-5 MG-MCG tablet Take 1 tablet by mouth daily with breakfast.   cyanocobalamin (VITAMIN B12) 500 MCG tablet Take 500 mcg by mouth daily.   ferrous sulfate 325 (65 FE) MG EC tablet Take 1 tablet (325 mg total) by mouth daily with breakfast.   furosemide (LASIX) 20 MG tablet Take 1 tablet (20 mg total) by mouth daily.   metoprolol succinate (TOPROL-XL) 25 MG 24 hr tablet Take 1 tablet (25 mg total) by mouth daily.   oxybutynin (DITROPAN) 5 MG tablet Take 1 tablet (5 mg total) by mouth 2 (two) times daily.   OXYGEN Inhale 4 L into the lungs continuous.   prasugrel (EFFIENT) 10 MG TABS tablet Take 1 tablet (10 mg total) by mouth daily.   valACYclovir (VALTREX) 1000 MG tablet Take 2 tablets (2,000 mg total) by mouth 2 (two) times daily. Once, as needed for cold sores   venlafaxine XR (EFFEXOR-XR) 150 MG 24 hr capsule TAKE 1 CAPSULE BY MOUTH AT BEDTIME **TAKE  WITH  75MG   CAPSULES  FOR  225MG   TOTAL  DAILY  DOSE (Patient taking differently: Take 150 mg by mouth at bedtime.)   venlafaxine XR (EFFEXOR-XR) 75 MG 24 hr capsule TAKE 1 CAPSULE BY MOUTH ONCE DAILY **TAKE  WITH  150MG   CAPSULES  FOR  225MG   TOTAL  DAILY  DOSE (Patient taking differently: Take 75 mg by mouth at bedtime. TAKE 1 CAPSULE BY MOUTH ONCE DAILY **TAKE  WITH  150MG   CAPSULES  FOR  225MG   TOTAL  DAILY  DOSE)   No facility-administered encounter medications on file as of 03/17/2024.     Review of Systems  Review  of Systems  N/a Physical Exam  BP 104/68 (BP Location: Left Arm, Patient Position: Sitting, Cuff Size: Normal)   Pulse 69   Ht 5\' 6"  (1.676 m)   Wt 126 lb (57.2 kg)   SpO2 97%   BMI 20.34 kg/m   Wt Readings from Last 5 Encounters:  03/17/24 126 lb (57.2 kg)  02/24/24 124 lb (56.2 kg)  01/14/24 128 lb 6.4 oz (58.2 kg)  12/31/23 132 lb (59.9 kg)  12/21/23 125 lb 6.4 oz (56.9 kg)    BMI Readings from Last  5 Encounters:  03/17/24 20.34 kg/m  02/24/24 19.42 kg/m  01/14/24 20.11 kg/m  12/31/23 20.67 kg/m  12/21/23 19.64 kg/m     Physical Exam General: Sitting in chair, no acute distress Eyes: EOMI, no icterus Neck: Supple, no JVD appreciated Pulmonary: Distant, clear, normal work of breathing Cardiovascular: Warm, no edema Abdomen: Nondistended, bowel sounds present MSK: No synovitis, no joint effusion Neuro: Normal gait, no weakness Psych: Normal mood, full affect   Assessment & Plan:   Dyspnea on exertion: High suspicion for smoking-related lung disease given severe emphysema seen on CT scan.  Prior PFTs 2020 with very mild COPD.  Repeat PFTs for further evaluation.  Emphysema with severe COPD: Severe suspect obstruction on PFTs 01/2024.  Escalated to [facility] with exacerbation in late 2024.  Continue Breztri.  Eosinophils elevated in the past and ongoing symptoms.  Paperwork for Dupixent and COPD today.  Consider addition of Ohtuvayre in the future.  Chronic hypoxemic respiratory failure: Related to emphysema, COPD.  Continue oxygen supplementation.  Goal O2 sats 88% or greater.   Return in about 4 months (around 07/17/2024) for f/u Dr. Judeth Horn.   Karren Burly, MD 03/17/2024   This appointment required 64 minutes of patient care (this includes precharting, chart review, review of results, face-to-face care, etc.).

## 2024-03-21 ENCOUNTER — Telehealth: Payer: Self-pay

## 2024-03-21 ENCOUNTER — Encounter: Payer: Self-pay | Admitting: Cardiology

## 2024-03-21 ENCOUNTER — Ambulatory Visit: Payer: Medicare Other | Attending: Cardiology | Admitting: Cardiology

## 2024-03-21 VITALS — BP 110/66 | HR 83 | Resp 16 | Ht 66.0 in | Wt 127.0 lb

## 2024-03-21 DIAGNOSIS — J431 Panlobular emphysema: Secondary | ICD-10-CM | POA: Diagnosis not present

## 2024-03-21 DIAGNOSIS — I2510 Atherosclerotic heart disease of native coronary artery without angina pectoris: Secondary | ICD-10-CM | POA: Diagnosis not present

## 2024-03-21 DIAGNOSIS — I214 Non-ST elevation (NSTEMI) myocardial infarction: Secondary | ICD-10-CM

## 2024-03-21 DIAGNOSIS — Z87891 Personal history of nicotine dependence: Secondary | ICD-10-CM

## 2024-03-21 DIAGNOSIS — E782 Mixed hyperlipidemia: Secondary | ICD-10-CM

## 2024-03-21 DIAGNOSIS — Z955 Presence of coronary angioplasty implant and graft: Secondary | ICD-10-CM

## 2024-03-21 DIAGNOSIS — J449 Chronic obstructive pulmonary disease, unspecified: Secondary | ICD-10-CM | POA: Diagnosis not present

## 2024-03-21 DIAGNOSIS — I700 Atherosclerosis of aorta: Secondary | ICD-10-CM

## 2024-03-21 DIAGNOSIS — Z9981 Dependence on supplemental oxygen: Secondary | ICD-10-CM | POA: Diagnosis not present

## 2024-03-21 MED ORDER — METOPROLOL SUCCINATE ER 25 MG PO TB24: 25.0000 mg | ORAL_TABLET | Freq: Every day | ORAL | 3 refills | Status: AC

## 2024-03-21 MED ORDER — PRASUGREL HCL 10 MG PO TABS: 10.0000 mg | ORAL_TABLET | Freq: Every day | ORAL | 3 refills | Status: DC

## 2024-03-21 MED ORDER — ATORVASTATIN CALCIUM 80 MG PO TABS: 80.0000 mg | ORAL_TABLET | Freq: Every day | ORAL | 3 refills | Status: AC

## 2024-03-21 MED ORDER — ASPIRIN 81 MG PO TBEC: 81.0000 mg | DELAYED_RELEASE_TABLET | Freq: Every day | ORAL | Status: AC

## 2024-03-21 NOTE — Patient Instructions (Addendum)
 Medication Instructions:  Your physician has recommended you make the following change in your medication:   START Aspirin 81 mg once daily   START Prasugrel (Effient) 10 mg once daily   Refills for Atorvastatin (Lipitor), Metoprolol Succinate (Toprol-XL) send to your pharmacy.  *If you need a refill on your cardiac medications before your next appointment, please call your pharmacy*  Lab Work: None ordered today. If you have labs (blood work) drawn today and your tests are completely normal, you will receive your results only by: MyChart Message (if you have MyChart) OR A paper copy in the mail If you have any lab test that is abnormal or we need to change your treatment, we will call you to review the results.  Testing/Procedures: None ordered today.  Follow-Up: At [facility], you and your health needs are our priority.  As part of our continuing mission to provide you with exceptional heart care, we have created designated Provider Care Teams.  These Care Teams include your primary Cardiologist (physician) and Advanced Practice Providers (APPs -  Physician Assistants and Nurse Practitioners) who all work together to provide you with the care you need, when you need it.  We recommend signing up for the patient portal called "MyChart".  Sign up information is provided on this After Visit Summary.  MyChart is used to connect with patients for Virtual Visits (Telemedicine).  Patients are able to view lab/test results, encounter notes, upcoming appointments, etc.  Non-urgent messages can be sent to your provider as well.   To learn more about what you can do with MyChart, go to [email or web address].    Your next appointment:   6 month(s)  The format for your next appointment:   In Person  Provider:   Melita Springer, PA-C, Lawana Pray, NP, Lovette Rud, PA-C, Charles Connor, NP, Marcie Sever, PA-C, Dayna Dunn, PA-C, Madison Fountain, NP, Callie Goodrich, PA-C, [place], PA-C,  Kathleen Johnson, PA-C, Friddie Jetty, NP, Theotis Flake, PA-C, Willis Harter, PA-C, Hao Meng, PA-C, Marlana Silvan, NP, Laquita Plant, PA-C, Marlyse Single, PA-C, Katlyn West, NP, Evan Williams, PA-C, or Xika Zhao, NP. Then, Sunit Tolia, DO will plan to see you again in 1 year(s).{  Other Instructions   1st Floor: - Lobby - Registration  - Pharmacy  - Lab - Cafe  2nd Floor: - PV Lab - Diagnostic Testing (echo, CT, nuclear med)  3rd Floor: - Vacant  4th Floor: - TCTS (cardiothoracic surgery) - AFib Clinic - Structural Heart Clinic - Vascular Surgery  - Vascular Ultrasound  5th Floor: - [facility] (general and EP) - Clinical Pharmacy for coumadin, hypertension, lipid, weight-loss medications, and med management appointments    Valet parking services will be available as well.

## 2024-03-21 NOTE — Telephone Encounter (Signed)
 Received New start paperwork for DUPIXENT. Will update as we work through the benefits process.  Submitted a Prior Authorization request to [employer] for DUPIXENT via CoverMyMeds. Will update once we receive a response.  Key: [id number]

## 2024-03-21 NOTE — Progress Notes (Signed)
 Cardiology Office Note:  .   Date:  03/21/2024  ID:  Sherry Zimmerman, DOB 03-06-1956, MRN [id number] PCP:  Luevenia Saha, MD  Former Cardiology Providers: None [facility] Providers Cardiologist:  Olinda Bertrand, DO , [facility] (established care 03/21/24) Electrophysiologist:  None  Click to update primary MD,subspecialty MD or APP then REFRESH:1}    Chief Complaint  Patient presents with   Coronary artery disease involving native coronary artery of   Follow-up    History of Present Illness: .   Sherry Zimmerman is a 68 y.o. Caucasian female whose past medical history and cardiovascular risk factors includes: NSTEMI January 2025, Coronary artery disease, former smoker (quit in October 2024), COPD / Emphysema / chronic hypoxemic respiratory failure on home oxygen, anemia, hyperlipidemia, Raynaud's disease, anxiety/depression, cerebellar ataxia, elevated LP(a).  In January 2025 she presented to [facility] with a chief complaint of shortness of breath and nausea.  Initial EKG showed sinus tachycardia with minimal anterolateral ST depressions, high sensitive troponins peaked at 1005, CTA was negative for PE, echo noted mildly reduced LVEF w/ regional wall motion.  Patient underwent left heart catheterization which noted severe single-vessel disease underwent DES/PCI of the RCA reemphasized importance of low-salt diet.  She was seen by APP back in January 2025 after hospitalization at that time recommended using Lasix on as needed basis in addition to her regular dose to see if dyspnea would improve.  However, she has followed up with pulmonary medicine in the interim and suspect progression of COPD/emphysema.  Currently on 4 L nasal cannula oxygen.  Overall functional capacity is limited due to dyspnea as well as cerebellar ataxia.  Currently denies anginal chest pain at rest or with effort related activities.  Should be on DAPT but not on her current list - husband thinks she is on  it based on how the drug is pronounced but is not sure. No recent bleeding to think the meds were discontinued.    Review of Systems: .   Review of Systems  Cardiovascular:  Positive for dyspnea on exertion (chronic and stable). Negative for chest pain, claudication, irregular heartbeat, leg swelling, near-syncope, orthopnea, palpitations, paroxysmal nocturnal dyspnea and syncope.  Respiratory:  Positive for shortness of breath (chronic and stable).   Hematologic/Lymphatic: Negative for bleeding problem.    Studies Reviewed:    Echocardiogram: 12/2023: LVEF 40-45%, regional wall motion abnormalities, right ventricular size and function normal, no significant valvular heart disease, estimated RAP 3 mmHg.  Heart catheterization 12/15/2023   Prox RCA-1 lesion is 40% stenosed.   Lesion Segment prox RCA-2 lesion is 80% stenosed.  Prox RCA to Mid RCA lesion is 70% stenosed.   A drug-eluting stent was successfully placed covering both lesions using a SYNERGY XD 3.50X20-deployed to 3.85 mm. Post intervention, there is a 0% residual stenosis for both lesions.  TIMI-3 flow pre and post   Prox LAD lesion is 10% stenosed.   LV end diastolic pressure is mildly elevated.   There is no aortic valve stenosis.   Diagnostic  Intervention Dominance: Right    Severe single-vessel disease with 40% proximal (dominant) RCA followed by tandem 80% and 70% lesions in the proximal to mid vessel just prior to a large RV marginal branch Successful DES PCI of RCA covering the 80% and 70% stenoses with a single Synergy XD 3.5 mm x 20 mm deployed to 3.8 mm. Otherwise normal Left Coronary Artery System with very tortuous heart. LVEDP 17 to 20 mmHg.      RECOMMENDATIONS   Anticipated discharge date to be determined-> May continue to require titration of cardiac and COPD medications.   Recommend uninterrupted dual antiplatelet therapy with  Aspirin 81mg  daily and Prasugrel 10mg  daily for a minimum of 12 months (ACS-Class I recommendation).  RADIOLOGY: NA  Risk Assessment/Calculations:   NA   Labs:       Latest Ref Rng & Units 12/31/2023   12:22 PM 12/17/2023    2:20 AM 12/16/2023    2:26 AM  CBC  WBC 3.4 - 10.8 x10E3/uL 13.7  15.8  20.0   Hemoglobin 11.1 - 15.9 g/dL 95.6  21.3  08.6   Hematocrit 34.0 - 46.6 % 35.1  34.2  37.6   Platelets 150 - 450 x10E3/uL 793  441  418        Latest Ref Rng & Units 12/16/2023    2:26 AM 12/15/2023    5:48 AM 12/14/2023    6:20 AM  BMP  Glucose 70 - 99 mg/dL 98  578  469   BUN 8 - 23 mg/dL 34  20  14   Creatinine 0.44 - 1.00 mg/dL 6.29  5.28  4.13   Sodium 135 - 145 mmol/L 136  139  139   Potassium 3.5 - 5.1 mmol/L 4.0  4.1  4.1   Chloride 98 - 111 mmol/L 99  103  105   CO2 22 - 32 mmol/L 23  24  25    Calcium 8.9 - 10.3 mg/dL 8.9  8.9  8.8       Latest Ref Rng & Units 12/16/2023    2:26 AM 12/15/2023    5:48 AM 12/14/2023    6:20 AM  CMP  Glucose 70 - 99 mg/dL 98  244  010   BUN 8 - 23 mg/dL 34  20  14   Creatinine 0.44 - 1.00 mg/dL 2.72  5.36  6.44   Sodium 135 - 145 mmol/L 136  139  139   Potassium 3.5 - 5.1 mmol/L 4.0  4.1  4.1   Chloride 98 - 111 mmol/L 99  103  105   CO2 22 - 32 mmol/L 23  24  25    Calcium 8.9 - 10.3 mg/dL 8.9  8.9  8.8     Lab Results  Component Value Date   CHOL 131 12/14/2023   HDL 48 12/14/2023   LDLCALC 64 12/14/2023   TRIG 95 12/14/2023   CHOLHDL 2.7 12/14/2023   Recent Labs    12/14/23 0620  LIPOA 182.0*   No components found for: "NTPROBNP" No results for input(s): "PROBNP" in the last 8760 hours. Recent Labs    10/07/23 1502  TSH 1.40    Physical Exam:    Today's Vitals   03/21/24 1505  BP: 110/66  Pulse: 83  Resp: 16  SpO2: 94%  Weight: 127 lb (57.6 kg)  Height: 5\' 6"  (1.676 m)   Body mass index is 20.5 kg/m. Wt Readings from Last 3 Encounters:  03/21/24 127 lb (57.6 kg)  03/17/24  126 lb (57.2 kg)  02/24/24 124 lb  (56.2 kg)    Physical Exam  Constitutional: No distress. She appears chronically ill.  hemodynamically stable  HENT:  Nasal canula oxygen.4L  Neck: No JVD present.  Cardiovascular: Normal rate, regular rhythm, S1 normal and S2 normal. Exam reveals no gallop, no S3 and no S4.  No murmur heard. Pulmonary/Chest: Effort normal. No stridor. She has no wheezes. She has no rales.  Decreased breath sounds bilateral  Abdominal: Soft. She exhibits no distension. There is no abdominal tenderness.  Musculoskeletal:        General: No edema.     Cervical back: Neck supple.  Skin: Skin is warm.     Impression & Recommendation(s):  Impression:   ICD-10-CM   1. Coronary artery disease involving native coronary artery of native heart without angina pectoris  I25.10 atorvastatin (LIPITOR) 80 MG tablet    aspirin EC 81 MG tablet    2. NSTEMI (non-ST elevated myocardial infarction) (HCC)  I21.4 metoprolol succinate (TOPROL-XL) 25 MG 24 hr tablet    3. Status post coronary artery stent placement  Z95.5 prasugrel (EFFIENT) 10 MG TABS tablet    aspirin EC 81 MG tablet    4. Atherosclerosis of aorta (HCC)  I70.0     5. Mixed hyperlipidemia  E78.2 atorvastatin (LIPITOR) 80 MG tablet    6. Chronic obstructive pulmonary disease, unspecified COPD type (HCC)  J44.9     7. Panlobular emphysema (HCC)  J43.1     8. Former smoker  Z87.891     9. Oxygen dependent  Z99.81        Recommendation(s):  Coronary artery disease involving native coronary artery of native heart without angina pectoris NSTEMI (non-ST elevated myocardial infarction) [facility]) Status post coronary artery stent placement Presented to the hospital in January 2025 and ruled in for NSTEMI Underwent left heart catheterization status post intervention to the RCA Currently on dual antiplatelet therapy. Continue Lipitor 80 mg p.o. nightly. Denies anginal chest pain or heart failure symptoms EKG is nonischemic Refill Lipitor 80 mg p.o.  daily, Toprol-XL 25 mg p.o. daily, and Effient 10 mg p.o. daily  Atherosclerosis of aorta (HCC) Continue anticoagulant therapy and statin medications. Risk factor modifications.  Hyperlipidemia, mixed Currently on Lipitor 80 mg p.o. nightly Most recent lipid profile from January 2025 independently reviewed, LDL 64 mg/dL. LP(a) are elevated, will need to follow-up longitudinally Consider fasting lipid profile at the next office visit if not recently checked.  Chronic obstructive pulmonary disease, unspecified COPD type (HCC) Panlobular emphysema (HCC) Former smoker Oxygen dependent Overall physical capacity/endurance limited due to poor lung function. Follows with pulmonary medicine. Reemphasized the importance of medical therapy and follow-up. Monitor for now   Orders Placed:  No orders of the defined types were placed in this encounter.    Final Medication List:    Meds ordered this encounter  Medications   atorvastatin (LIPITOR) 80 MG tablet    Sig: Take 1 tablet (80 mg total) by mouth daily.    Dispense:  90 tablet    Refill:  3   metoprolol succinate (TOPROL-XL) 25 MG 24 hr tablet    Sig: Take 1 tablet (25 mg total) by mouth daily.    Dispense:  90 tablet    Refill:  3   prasugrel (EFFIENT) 10 MG TABS tablet    Sig: Take 1 tablet (10 mg total) by mouth daily.    Dispense:  30 tablet    Refill:  3  aspirin EC 81 MG tablet    Sig: Take 1 tablet (81 mg total) by mouth daily. Swallow whole.    Medications Discontinued During This Encounter  Medication Reason   atorvastatin (LIPITOR) 80 MG tablet Reorder   metoprolol succinate (TOPROL-XL) 25 MG 24 hr tablet Reorder     Current Outpatient Medications:    albuterol (PROVENTIL) (2.5 MG/3ML) 0.083% nebulizer solution, Take 3 mLs (2.5 mg total) by nebulization every 6 (six) hours as needed for wheezing or shortness of breath., Disp: 150 mL, Rfl: 1   albuterol (VENTOLIN HFA) 108 (90 Base) MCG/ACT inhaler, Inhale 1-2  puffs into the lungs every 4 (four) hours as needed for wheezing or shortness of breath., Disp: 9 g, Rfl: 2   alendronate (FOSAMAX) 70 MG tablet, Take 1 tablet (70 mg total) by mouth every 7 (seven) days. Take with a full glass of water on an empty stomach., Disp: 4 tablet, Rfl: 11   aspirin EC 81 MG tablet, Take 1 tablet (81 mg total) by mouth daily. Swallow whole., Disp: , Rfl:    Budeson-Glycopyrrol-Formoterol (BREZTRI AEROSPHERE) 160-9-4.8 MCG/ACT AERO, Inhale 2 puffs into the lungs in the morning and at bedtime., Disp: 1 each, Rfl: 5   Budeson-Glycopyrrol-Formoterol (BREZTRI AEROSPHERE) 160-9-4.8 MCG/ACT AERO, Inhale 2 puffs into the lungs in the morning and at bedtime., Disp: , Rfl:    calcium-vitamin D (OSCAL WITH D) 500-5 MG-MCG tablet, Take 1 tablet by mouth daily with breakfast., Disp: , Rfl:    cyanocobalamin (VITAMIN B12) 500 MCG tablet, Take 500 mcg by mouth daily., Disp: , Rfl:    ferrous sulfate 325 (65 FE) MG EC tablet, Take 1 tablet (325 mg total) by mouth daily with breakfast., Disp: 90 tablet, Rfl: 3   furosemide (LASIX) 20 MG tablet, Take 1 tablet (20 mg total) by mouth daily., Disp: 90 tablet, Rfl: 3   oxybutynin (DITROPAN) 5 MG tablet, Take 1 tablet (5 mg total) by mouth 2 (two) times daily., Disp: 180 tablet, Rfl: 3   OXYGEN, Inhale 4 L into the lungs continuous., Disp: , Rfl:    prasugrel (EFFIENT) 10 MG TABS tablet, Take 1 tablet (10 mg total) by mouth daily., Disp: 30 tablet, Rfl: 3   valACYclovir (VALTREX) 1000 MG tablet, Take 2 tablets (2,000 mg total) by mouth 2 (two) times daily. Once, as needed for cold sores, Disp: 20 tablet, Rfl: 2   venlafaxine XR (EFFEXOR-XR) 150 MG 24 hr capsule, TAKE 1 CAPSULE BY MOUTH AT BEDTIME **TAKE  WITH  75MG   CAPSULES  FOR  225MG   TOTAL  DAILY  DOSE (Patient taking differently: Take 150 mg by mouth at bedtime.), Disp: 90 capsule, Rfl: 3   venlafaxine XR (EFFEXOR-XR) 75 MG 24 hr capsule, TAKE 1 CAPSULE BY MOUTH ONCE DAILY **TAKE  WITH  150MG    CAPSULES  FOR  225MG   TOTAL  DAILY  DOSE (Patient taking differently: Take 75 mg by mouth at bedtime. TAKE 1 CAPSULE BY MOUTH ONCE DAILY **TAKE  WITH  150MG   CAPSULES  FOR  225MG   TOTAL  DAILY  DOSE), Disp: 90 capsule, Rfl: 3   atorvastatin (LIPITOR) 80 MG tablet, Take 1 tablet (80 mg total) by mouth daily., Disp: 90 tablet, Rfl: 3   metoprolol succinate (TOPROL-XL) 25 MG 24 hr tablet, Take 1 tablet (25 mg total) by mouth daily., Disp: 90 tablet, Rfl: 3  Consent:   NA  Disposition:   Follow-up with APP in 6 months and myself in a year.  Her questions  and concerns were addressed to her satisfaction. She voices understanding of the recommendations provided during this encounter.    Signed, Olinda Bertrand, DO, [facility]  [facility]  [address] [place], [place] [address] 03/21/2024 5:33 PM

## 2024-03-22 ENCOUNTER — Other Ambulatory Visit: Payer: Self-pay

## 2024-03-22 ENCOUNTER — Other Ambulatory Visit (HOSPITAL_COMMUNITY): Payer: Self-pay

## 2024-03-22 NOTE — Patient Instructions (Signed)
 Visit Information  Thank you for taking time to visit with me today. Please don't hesitate to contact me if I can be of assistance to you before our next scheduled appointment.  Your next care management appointment is by telephone on Wednesday, May 14th at 1:00pm.   Please call the care guide team at [phone] if you need to cancel, schedule, or reschedule an appointment.   Please  if you are experiencing a Mental Health or Behavioral Health Crisis or need someone to talk to.  Jurline Olmsted BSN, CCM   VBCI Population Health RN Care Manager Direct Dial: [phone]  Fax: [phone]

## 2024-03-22 NOTE — Telephone Encounter (Signed)
 Received notification from [facility] regarding a prior authorization for DUPIXENT. Authorization has been APPROVED from 03/21/24 to 09/20/2024. Approval letter sent to scan center.  Per test claim, copay for 28 days supply is $1239.69  Authorization # [id number]  Will need to submit PAP

## 2024-03-22 NOTE — Telephone Encounter (Signed)
 Please let me know what they say after our discussion from earlier today.   Sherry Journey [place], DO, [facility]

## 2024-03-22 NOTE — Patient Outreach (Signed)
 Complex Care Management   Visit Note  03/22/2024  Name:  Sherry Zimmerman MRN: [id number] DOB: [birth year]  Situation: Referral received for Complex Care Management related to Heart Failure and COPD I obtained verbal consent from Patient.  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    Assessment: Patient Reported Symptoms:  Cognitive Cognitive Status: Alert and oriented to person, place, and time      Neurological      HEENT HEENT Symptoms Reported: No symptoms reported      Cardiovascular Cardiovascular Symptoms Reported: No symptoms reported    Respiratory Respiratory Symptoms Reported: No symptoms reported (patient is on 3LO2, has concentrator and portable tanks in the home, she is aware of DME company to call for repairs/refills (belives it's [employer])) Respiratory Conditions: COPD Respiratory Self-Management Outcome: 4 (good)  Endocrine Patient reports the following symptoms related to hypoglycemia or hyperglycemia : No symptoms reported    Gastrointestinal Gastrointestinal Symptoms Reported: No symptoms reported      Genitourinary      Integumentary Integumentary Symptoms Reported: No symptoms reported    Musculoskeletal Musculoskelatal Symptoms Reviewed: Unsteady gait (Uses rollator with ambulation. Last fall  was 03/02/24 with ED visit, xray of wrist showed no fractures.) Musculoskeletal Conditions: Mobility limited Musculoskeletal Self-Management Outcome: 4 (good) Falls in the past year?: Yes Was there an injury with Fall?: Yes (ED visit 03/02/24 to assess wrist pain, xray was unremarkable.) Patient at Risk for Falls Due to: History of fall(s), Impaired balance/gait  Psychosocial       Quality of Family Relationships: helpful, supportive      03/22/2024    1:34 PM  Depression screen PHQ 2/9  Decreased Interest 0  Down, Depressed, Hopeless 0  PHQ - 2 Score 0    There were no vitals filed for this visit.  Medications Reviewed Today     Reviewed by Isadore Marble, RN (Registered Nurse) on 03/22/24 at 1329  Med List Status: <None>   Medication Order Taking? Sig Documenting Provider Last Dose Status Informant  albuterol (PROVENTIL) (2.5 MG/3ML) 0.083% nebulizer solution [id number] Yes Take 3 mLs (2.5 mg total) by nebulization every 6 (six) hours as needed for wheezing or shortness of breath. Luevenia Saha, MD Taking Active   albuterol (VENTOLIN HFA) 108 (90 Base) MCG/ACT inhaler [id number] Yes Inhale 1-2 puffs into the lungs every 4 (four) hours as needed for wheezing or shortness of breath. Macdonald Savoy, MD Taking Active Self, Pharmacy Records           Med Note [facility] [place] [facility]   Fri Dec 18, 2023  9:58 AM) Ferd Householder other unable to locate inhaler in home-will call pharmacy to get refill  alendronate (FOSAMAX) 70 MG tablet [id number]  Take 1 tablet (70 mg total) by mouth  every 7 (seven) days. Take with a full glass of water on an empty stomach. Willow Ora, MD  Active Self, Pharmacy Records  aspirin EC 81 MG tablet [id number]  Take 1 tablet (81 mg total) by mouth daily. Swallow whole. Odis Hollingshead, Sunit, DO  Active   atorvastatin (LIPITOR) 80 MG tablet [id number]  Take 1 tablet (80 mg total) by mouth daily. Odis Hollingshead, Sunit, DO  Active   Budeson-Glycopyrrol-Formoterol (BREZTRI AEROSPHERE)  160-9-4.8 MCG/ACT AERO [id number]  Inhale 2 puffs into the lungs in the morning and at bedtime. Parrett, Virgel Bouquet, NP  Active   Budeson-Glycopyrrol-Formoterol (BREZTRI AEROSPHERE) 160-9-4.8 MCG/ACT AERO [id number]  Inhale 2 puffs into the lungs in the morning and at bedtime. Parrett, Virgel Bouquet, NP  Active   calcium-vitamin D (OSCAL WITH D) 500-5 MG-MCG tablet [id number]  Take 1 tablet by mouth daily with breakfast. Willow Ora, MD  Active Self, Pharmacy Records  cyanocobalamin (VITAMIN B12) 500 MCG tablet [id number]  Take 500 mcg by mouth daily. [provider]  Active Self, Pharmacy Records  ferrous sulfate 325 (65 FE) MG EC tablet [id number]  Take 1 tablet (325 mg total) by mouth daily with breakfast. Willow Ora, MD  Active   furosemide (LASIX) 20 MG tablet [id number]  Take 1 tablet (20 mg total) by mouth daily. Willow Ora, MD  Active   metoprolol succinate (TOPROL-XL) 25 MG 24 hr tablet [id number]  Take 1 tablet (25 mg total) by mouth daily. Odis Hollingshead, Sunit, DO  Active   oxybutynin (DITROPAN) 5 MG tablet [id number]  Take 1 tablet (5 mg total) by mouth 2 (two) times daily. Willow Ora, MD  Active   OXYGEN [id number] Yes Inhale 4 L into the lungs continuous. [provider] Taking Active   prasugrel (EFFIENT) 10 MG TABS tablet [id number]  Take 1 tablet (10 mg total) by mouth daily. Odis Hollingshead, Sunit, DO  Active   valACYclovir (VALTREX) 1000 MG tablet [id number]  Take 2 tablets (2,000 mg total) by mouth 2 (two) times daily. Once, as needed for cold sores Willow Ora, MD  Active Self, Pharmacy Records  venlafaxine XR (EFFEXOR-XR) 150 MG 24 hr capsule [id number]  TAKE 1 CAPSULE BY MOUTH AT BEDTIME **TAKE  WITH  75MG   CAPSULES  FOR  225MG   TOTAL  DAILY  DOSE  Patient taking differently: Take 150 mg by mouth at bedtime.   Willow Ora, MD  Active Self, Pharmacy Records  venlafaxine XR (EFFEXOR-XR) 75 MG 24 hr capsule [id number]  TAKE 1 CAPSULE BY MOUTH ONCE DAILY **TAKE  WITH  150MG    CAPSULES  FOR  225MG   TOTAL  DAILY  DOSE  Patient taking differently: Take 75 mg by mouth at bedtime. TAKE 1 CAPSULE BY MOUTH ONCE DAILY **TAKE  WITH  150MG   CAPSULES  FOR  225MG   TOTAL  DAILY  DOSE   Willow Ora, MD  Active Self, Pharmacy Records            Recommendation:   Perform daily weights and log.  Report weight gains of 3lbs in one day or 5lbs in one week.  Cardio is also asking to report any weight LOSS due to Prasugrel is prescribed based on weight and patient is at borderline weight. She will keep appointments with Cardio around October 2025 and Pulmonary 06/09/24  Follow Up Plan:   Telephone follow up appointment date/time:  in one month, Wednesday, May 14th at 1:00pm.   Jeani Hawking BSN, CCM [place]  VBCI  Population Health RN Care Manager Direct Dial: [phone]  Fax: [phone]

## 2024-03-23 NOTE — Telephone Encounter (Signed)
 Lets revisit her weight before the next refill. If the weight does not go up will reduce the dose of Prasugrel.   Mitul Hallowell [place], DO, [facility]

## 2024-03-28 ENCOUNTER — Ambulatory Visit: Payer: Medicare Other | Admitting: Family Medicine

## 2024-03-30 ENCOUNTER — Ambulatory Visit (INDEPENDENT_AMBULATORY_CARE_PROVIDER_SITE_OTHER): Payer: Medicare Other | Admitting: Family Medicine

## 2024-03-30 ENCOUNTER — Encounter: Payer: Self-pay | Admitting: Family Medicine

## 2024-03-30 VITALS — BP 120/82 | HR 100 | Temp 97.3°F | Ht 66.0 in | Wt 126.8 lb

## 2024-03-30 DIAGNOSIS — I2510 Atherosclerotic heart disease of native coronary artery without angina pectoris: Secondary | ICD-10-CM | POA: Diagnosis not present

## 2024-03-30 DIAGNOSIS — Z955 Presence of coronary angioplasty implant and graft: Secondary | ICD-10-CM

## 2024-03-30 DIAGNOSIS — J431 Panlobular emphysema: Secondary | ICD-10-CM | POA: Diagnosis not present

## 2024-03-30 DIAGNOSIS — I214 Non-ST elevation (NSTEMI) myocardial infarction: Secondary | ICD-10-CM | POA: Diagnosis not present

## 2024-03-30 DIAGNOSIS — G119 Hereditary ataxia, unspecified: Secondary | ICD-10-CM

## 2024-03-30 DIAGNOSIS — E782 Mixed hyperlipidemia: Secondary | ICD-10-CM

## 2024-03-30 DIAGNOSIS — I2584 Coronary atherosclerosis due to calcified coronary lesion: Secondary | ICD-10-CM | POA: Diagnosis not present

## 2024-03-30 DIAGNOSIS — D508 Other iron deficiency anemias: Secondary | ICD-10-CM | POA: Diagnosis not present

## 2024-03-30 LAB — LIPID PANEL
Cholesterol: 135 mg/dL (ref 0–200)
HDL: 50.8 mg/dL (ref >39.00)
LDL Cholesterol: 59 mg/dL (ref 0–99)
NonHDL: 84.04
Total CHOL/HDL Ratio: 3
Triglycerides: 124 mg/dL (ref 0.0–149.0)
VLDL: 24.8 mg/dL (ref 0.0–40.0)

## 2024-03-30 LAB — CBC WITH DIFFERENTIAL/PLATELET
Basophils Absolute: 0.1 10*3/uL (ref 0.0–0.1)
Basophils Relative: 0.6 % (ref 0.0–3.0)
Eosinophils Absolute: 0.2 10*3/uL (ref 0.0–0.7)
Eosinophils Relative: 1.3 % (ref 0.0–5.0)
HCT: 39.5 % (ref 36.0–46.0)
Hemoglobin: 12.6 g/dL (ref 12.0–15.0)
Lymphocytes Relative: 16 % (ref 12.0–46.0)
Lymphs Abs: 2.3 10*3/uL (ref 0.7–4.0)
MCHC: 32 g/dL (ref 30.0–36.0)
MCV: 88.6 fl (ref 78.0–100.0)
Monocytes Absolute: 0.8 10*3/uL (ref 0.1–1.0)
Monocytes Relative: 5.4 % (ref 3.0–12.0)
Neutro Abs: 11.2 10*3/uL — ABNORMAL HIGH (ref 1.4–7.7)
Neutrophils Relative %: 76.7 % (ref 43.0–77.0)
Platelets: 548 10*3/uL — ABNORMAL HIGH (ref 150.0–400.0)
RBC: 4.45 Mil/uL (ref 3.87–5.11)
RDW: 16.5 % — ABNORMAL HIGH (ref 11.5–15.5)
WBC: 14.6 10*3/uL — ABNORMAL HIGH (ref 4.0–10.5)

## 2024-03-30 LAB — COMPREHENSIVE METABOLIC PANEL WITH GFR
ALT: 15 U/L (ref 0–35)
AST: 18 U/L (ref 0–37)
Albumin: 4 g/dL (ref 3.5–5.2)
Alkaline Phosphatase: 88 U/L (ref 39–117)
BUN: 13 mg/dL (ref 6–23)
CO2: 29 meq/L (ref 19–32)
Calcium: 9.3 mg/dL (ref 8.4–10.5)
Chloride: 101 meq/L (ref 96–112)
Creatinine, Ser: 0.93 mg/dL (ref 0.40–1.20)
GFR: 62.83 mL/min (ref >60.00)
Glucose, Bld: 150 mg/dL — ABNORMAL HIGH (ref 70–99)
Potassium: 3.2 meq/L — ABNORMAL LOW (ref 3.5–5.1)
Sodium: 140 meq/L (ref 135–145)
Total Bilirubin: 0.4 mg/dL (ref 0.2–1.2)
Total Protein: 7.1 g/dL (ref 6.0–8.3)

## 2024-03-30 NOTE — Progress Notes (Signed)
 Subjective  CC:  Chief Complaint  Patient presents with   3 month f/u    HPI: Sherry Zimmerman is a 68 y.o. female who presents to the office today to address the problems listed above in the chief complaint. Discussed the use of AI scribe software for clinical note transcription with the patient, who gave verbal consent to proceed.  History of Present Illness   Sherry Zimmerman is a 68 year old female with coronary artery disease and chronic obstructive pulmonary disease who presents for follow-up on her recovery and medication management. S/p MI and stenting jan 2025. See last note.  She is recovering from a recent myocardial infarction and hospitalization. Her energy levels have not fully returned, and she continues to experience shortness of breath. She is on continuous oxygen therapy, currently set at three liters per minute, but sometimes requires adjustments to four liters. Her lung capacity has decreased over the years, now at 33% compared to 65-70% a few years ago. No current chest pain. Her caregiver notes that she has become more independent but still struggles with activities like moving from the recliner to the bathroom without getting winded. She is not able to perform housework or cooking due to her condition.  She is on a higher dose of Lipitor  to manage her cholesterol and is taking iron supplements. She is also on a 10 mg tablet of a medication to keep her stents open. She experiences bruising easily but reports no active bleeding, stomach pain, or chest pain. Her mood is stable, and she is on a high dose of mood medication, which has been difficult to reduce in the past.  She has a cracked tooth that was filed down by the dentist to prevent tongue irritation. The tooth broke when she bit down on a Snickers bar before the new year.  Her mood is well controlled on long term venlafaxine , high dose. Has failed decreasing dose in past.   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  She uses a wheelchair for outings but can walk short distances at home with assistance. Her caregiver is attentive to her safety, especially given her tendency to bruise easily.       Assessment  1. NSTEMI (non-ST elevated myocardial infarction) (HCC)   2. Coronary artery disease due to calcified coronary lesion   3. Panlobular emphysema (HCC)   4. Status post coronary artery stent placement   5. Acquired iron deficiency anemia due to decreased absorption   6. Mixed hyperlipidemia   7. Familial cerebellar ataxia (HCC)      Plan  Assessment and Plan    Coronary artery disease with stent placement On antiplatelet therapy to maintain stent patency. Coordination with cardiologist and dentist required for therapy management. - Recheck blood work for cholesterol, liver, and kidney function. - Coordinate with cardiologist for antiplatelet therapy and dosage. - Discuss dental procedure timing with dentist and cardiologist.  Heart attack Recovering with improved energy but shortness of breath on exertion. No chest pain.  Chronic respiratory failure with hypoxia Requires continuous oxygen therapy at 3-4 L/min due to low oxygen saturation and exertional dyspnea. Follow with pulm Continue inhalers and oxygen  Osteoporosis On fosamax  for bone health. Risk of falls and bruising due to medication and condition. - Continue osteoporosis medication. - Monitor for falls and bruising, consider protective measures.   HLD on lipitor  80 for recheck today. Non fasting. Tolerating.   Continue venlafaxine   for mood       Orders Placed This Encounter  Procedures   Lipid panel   Comprehensive metabolic panel with GFR   CBC with Differential/Platelet   Iron, TIBC and Ferritin Panel   No orders of the defined types were placed in this encounter.    I reviewed the patients updated PMH, FH, and SocHx.    Patient Active Problem List    Diagnosis Date Noted   Status post coronary artery stent placement 12/16/2023    Priority: High   NSTEMI (non-ST elevated myocardial infarction) (HCC) 12/13/2023    Priority: High   Coronary artery disease due to calcified coronary lesion 12/13/2023    Priority: High   Atherosclerosis of aorta (HCC) 12/13/2023    Priority: High   Osteoporosis 07/08/2021    Priority: High   Former smoker 07/08/2021    Priority: High   Mixed hyperlipidemia 11/18/2019    Priority: High   Major depression, recurrent, chronic (HCC) 08/13/2018    Priority: High   Emphysema of lung (HCC) 05/13/2018    Priority: High   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    Priority: High   Migraine without aura and without status migrainosus, not intractable 01/10/2019    Priority: Medium    Acquired iron deficiency anemia due to decreased absorption 06/18/2018    Priority: Medium    OAB (overactive bladder) 11/09/2014    Priority: Medium    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   albuterol  (VENTOLIN  HFA) 108 (90 Base) MCG/ACT inhaler Inhale 1-2 puffs into the lungs every 4 (four) hours as needed for wheezing or shortness of breath.   alendronate  (FOSAMAX ) 70 MG tablet Take 1 tablet (70 mg total) by mouth every 7 (seven) days. Take with a full glass of water on an empty stomach.   aspirin  EC 81 MG tablet Take 1 tablet (81 mg total) by mouth daily. Swallow whole.   atorvastatin  (LIPITOR ) 80 MG tablet Take 1 tablet (80 mg total) by mouth daily.   Budeson-Glycopyrrol-Formoterol   (BREZTRI  AEROSPHERE) 160-9-4.8 MCG/ACT AERO Inhale 2 puffs into the lungs in the morning and at bedtime.   calcium -vitamin D  (OSCAL WITH D) 500-5 MG-MCG tablet Take 1 tablet by mouth daily with breakfast.   cyanocobalamin  (VITAMIN B12) 500 MCG tablet Take 500 mcg by mouth daily.   ferrous sulfate  325 (65 FE) MG EC tablet Take 1 tablet (325 mg total) by mouth daily with breakfast.   furosemide  (LASIX ) 20 MG tablet Take 1 tablet (20 mg total) by mouth daily.   metoprolol  succinate (TOPROL -XL) 25 MG 24 hr tablet Take 1 tablet (25 mg total) by mouth daily.   oxybutynin  (DITROPAN ) 5 MG tablet Take 1 tablet (5 mg total) by mouth 2 (two) times daily.   OXYGEN Inhale 4 L into the lungs continuous.   prasugrel  (EFFIENT ) 10 MG TABS tablet Take 1 tablet (10 mg total) by mouth daily.   valACYclovir  (VALTREX ) 1000 MG tablet Take 2 tablets (2,000 mg total) by mouth 2 (two) times daily.  Once, as needed for cold sores   venlafaxine  XR (EFFEXOR -XR) 150 MG 24 hr capsule TAKE 1 CAPSULE BY MOUTH AT BEDTIME **TAKE  WITH  75MG   CAPSULES  FOR  225MG   TOTAL  DAILY  DOSE (Patient taking differently: Take 150 mg by mouth at bedtime.)   venlafaxine  XR (EFFEXOR -XR) 75 MG 24 hr capsule TAKE 1 CAPSULE BY MOUTH ONCE DAILY **TAKE  WITH  150MG   CAPSULES  FOR  225MG   TOTAL  DAILY  DOSE (Patient taking differently: Take 75 mg by mouth at bedtime. TAKE 1 CAPSULE BY MOUTH ONCE DAILY **TAKE  WITH  150MG   CAPSULES  FOR  225MG   TOTAL  DAILY  DOSE)   [DISCONTINUED] Budeson-Glycopyrrol-Formoterol  (BREZTRI  AEROSPHERE) 160-9-4.8 MCG/ACT AERO Inhale 2 puffs into the lungs in the morning and at bedtime.    Allergies: Patient is allergic to morphine and sulfa antibiotics. Family History: Patient family history includes Ataxia in her mother; Cancer in her father; Diabetes in her mother; Heart disease in her mother; Stroke in her mother. Social History:  Patient  reports that she quit smoking about 45 years ago. Her smoking use included  cigarettes. She started smoking about 2 years ago. She has a 50 pack-year smoking history. She has never used smokeless tobacco. She reports that she does not currently use alcohol. She reports that she does not use drugs.  Review of Systems: Constitutional: Negative for fever malaise or anorexia Cardiovascular: negative for chest pain Respiratory: negative for SOB or persistent cough Gastrointestinal: negative for abdominal pain  Objective  Vitals: BP 120/82   Pulse 100   Temp (!) 97.3 F (36.3 C)   Ht 5\' 6"  (1.676 m)   Wt 126 lb 12.8 oz (57.5 kg)   SpO2 95%   BMI 20.47 kg/m  General: no acute distress , A&Ox3, wearing oxygen. In wheelchair Cardiovascular:  RRR without murmur or gallop.  Respiratory:  distant  breath sounds bilaterally, CTAB w/o wheeze No edema  Lab Results  Component Value Date   WBC 13.7 (H) 12/31/2023   HGB 10.9 (L) 12/31/2023   HCT 35.1 12/31/2023   MCV 84 12/31/2023   PLT 793 (H) 12/31/2023   Lab Results  Component Value Date   CHOL 131 12/14/2023   HDL 48 12/14/2023   LDLCALC 64 12/14/2023   TRIG 95 12/14/2023   CHOLHDL 2.7 12/14/2023   Lab Results  Component Value Date   NA 136 12/16/2023   CL 99 12/16/2023   K 4.0 12/16/2023   CO2 23 12/16/2023   BUN 34 (H) 12/16/2023   CREATININE 1.00 12/16/2023   GFRNONAA >60 12/16/2023   CALCIUM  8.9 12/16/2023   ALBUMIN 3.7 11/13/2023   GLUCOSE 98 12/16/2023     Commons side effects, risks, benefits, and alternatives for medications and treatment plan prescribed today were discussed, and the patient expressed understanding of the given instructions. Patient is instructed to call or message via MyChart if he/she has any questions or concerns regarding our treatment plan. No barriers to understanding were identified. We discussed Red Flag symptoms and signs in detail. Patient expressed understanding regarding what to do in case of urgent or emergency type symptoms.  Medication list was reconciled,  printed and provided to the patient in AVS. Patient instructions and summary information was reviewed with the patient as documented in the AVS. This note was prepared with assistance of Dragon voice recognition software. Occasional wrong-word or sound-a-like substitutions may have occurred due to the inherent limitations of voice recognition software

## 2024-03-30 NOTE — Patient Instructions (Signed)
 Please return in 6 months for your annual complete physical; please come fasting.    I will release your lab results to you on your MyChart account with further instructions. You may see the results before I do, but when I review them I will send you a message with my report or have my assistant call you if things need to be discussed. Please reply to my message with any questions. Thank you!   If you have any questions or concerns, please don't hesitate to send me a message via MyChart or call the office at [phone]. Thank you for visiting with us  today! It's our pleasure caring for you.   VISIT SUMMARY:  Sherry Zimmerman, a 68 year old female with coronary artery disease and chronic obstructive pulmonary disease, visited for a follow-up on her recovery and medication management. She is recovering from a recent heart attack and continues to experience shortness of breath. She is on continuous oxygen therapy and has decreased lung capacity. Her cholesterol and blood sugar levels are well-controlled, but she experiences easy bruising and has a history of falls. She also has a cracked tooth that was recently filed down by her dentist.  YOUR PLAN:  -CORONARY ARTERY DISEASE WITH STENT PLACEMENT: Coronary artery disease is a condition where the blood vessels supplying the heart are narrowed or blocked. You are on medication to keep your stents open and will need to recheck your blood work for cholesterol, liver, and kidney function. Coordination with your cardiologist and dentist is required for managing your therapy and timing any dental procedures.  -HEART ATTACK: A heart attack occurs when blood flow to the heart is blocked. You are recovering well with improved energy but still experience shortness of breath during activities. It's important to continue monitoring your symptoms and follow your treatment plan.  -CHRONIC RESPIRATORY FAILURE WITH HYPOXIA: Chronic respiratory failure with hypoxia  means your lungs are not able to provide enough oxygen to your body. You need continuous oxygen therapy set at 3-4 liters per minute to maintain your oxygen levels and help with shortness of breath.  -OSTEOPOROSIS: Osteoporosis is a condition that weakens bones, making them fragile and more likely to break. You are on medication for bone health. It's important to monitor for falls and bruising and consider protective measures to prevent injuries.  I will follow up on your cholesterol levels and blood counts today.   INSTRUCTIONS:  Please recheck your blood work for cholesterol, liver, and kidney function. Coordinate with your cardiologist for your antiplatelet therapy and dosage, and discuss the timing of any dental procedures with both your dentist and cardiologist. We will review your insulin prescription to ensure you have a 90-day supply. Continue taking your osteoporosis medication and monitor for falls and bruising. Consider protective measures to prevent injuries.

## 2024-03-31 ENCOUNTER — Encounter: Payer: Self-pay | Admitting: Family Medicine

## 2024-03-31 LAB — IRON,TIBC AND FERRITIN PANEL
%SAT: 16 % (ref 16–45)
Ferritin: 39 ng/mL (ref 16–288)
Iron: 55 ug/dL (ref 45–160)
TIBC: 338 ug/dL (ref 250–450)

## 2024-03-31 MED ORDER — POTASSIUM CHLORIDE CRYS ER 20 MEQ PO TBCR: 20.0000 meq | EXTENDED_RELEASE_TABLET | Freq: Every day | ORAL | 3 refills | Status: AC | PRN

## 2024-03-31 NOTE — Progress Notes (Signed)
 See mychart note Dear Ms. Seidel, It was so good seeing you yesterday. Your lab results show that your potassium is a little low due to taking furosemide  everyday. I have ordered a potassium supplement to take along with the furosemide  daily. Please start it now. You may take it twice a day for the first two days, then take once daily with the furosemide . Do not take it if you don't take the furosemide .  Your white blood cell count remains a little high. We need to watch out for any infections. Please let me know if you notice any symptoms of respiratory infection or otherwise right away.  Sincerely, Dr. Jonelle Neri

## 2024-03-31 NOTE — Addendum Note (Signed)
 Addended by: Karma Oz on: 03/31/2024 05:07 PM   Modules accepted: Orders

## 2024-04-01 ENCOUNTER — Other Ambulatory Visit (HOSPITAL_COMMUNITY): Payer: Self-pay

## 2024-04-01 NOTE — Telephone Encounter (Signed)
Submitted Patient Assistance Application to [employer] for DUPIXENT along with provider portion, patient portion, PA, medication list, insurance card copy and income documents. Will update patient when we receive a response.  Phone #: [phone] Fax #: [phone]

## 2024-04-11 ENCOUNTER — Telehealth: Payer: Self-pay

## 2024-04-11 NOTE — Telephone Encounter (Signed)
 Copied from CRM [id number]. Topic: Clinical - Medical Advice >> Apr 11, 2024  4:11 PM Ilene Malling wrote: Reason for CRM: Patient [phone] is returning the office call, no message was left and patient is unsure of the call if it's regarding upcoming appointment with Dr. Marygrace Snellen or DUPIXENT medication. Please advise and call back.   Routing to pharmacy team to see if she was contacted regarding dupixent.

## 2024-04-12 NOTE — Telephone Encounter (Signed)
 Spoke with patient to encourage her to reach out to [employer] to complete screening for patient assistance program. Patient was given their contact number [phone]) and sent a MyChart message with further details.

## 2024-04-20 ENCOUNTER — Other Ambulatory Visit: Payer: Self-pay

## 2024-04-21 NOTE — Telephone Encounter (Signed)
 Email to be sent to Alba Huddle for case update

## 2024-04-30 ENCOUNTER — Other Ambulatory Visit (HOSPITAL_COMMUNITY): Payer: Self-pay

## 2024-05-17 ENCOUNTER — Telehealth: Payer: Self-pay | Admitting: *Deleted

## 2024-05-17 NOTE — Progress Notes (Unsigned)
 Complex Care Management Care Guide Note  05/17/2024 Name: Sherry Zimmerman MRN: [id number] DOB: Jul 10, 1956  Saunders Curio Silber is a 68 y.o. year old female who is a primary care patient of Luevenia Saha, MD and is actively engaged with the care management team. I reached out to Dore Gambler by phone today to assist with re-scheduling  with the RN Case Manager.  Follow up plan: Unsuccessful telephone outreach attempt made. A HIPAA compliant phone message was left for the patient providing contact information and requesting a return call.  Kandis Ormond, CMA [place]  [facility], [facility] Guide Direct Dial: [phone]  Fax: [phone] Website: [email or web address]

## 2024-05-18 NOTE — Progress Notes (Signed)
 Complex Care Management Care Guide Note  05/18/2024 Name: Sherry Zimmerman MRN: [id number] DOB: [birth year]  [name] is a 68 y.o. year old female who is a primary care patient of Luevenia Saha, MD and is actively engaged with the care management team. I reached out to Dore Gambler by phone today to assist with re-scheduling  with the RN Case Manager.  Follow up plan: Unsuccessful telephone outreach attempt made. A HIPAA compliant phone message was left for the patient providing contact information and requesting a return call. No further outreach attempts will be made due to inability to maintain patient contact.   Kandis Ormond, CMA [place]  [facility], [facility] Guide Direct Dial: [phone]  Fax: [phone] Website: [email or web address]

## 2024-05-20 NOTE — Progress Notes (Signed)
 Complex Care Management Care Guide Note  05/20/2024 Name: Sherry Zimmerman MRN: [id number] DOB: [birth year]  [name] is a 68 y.o. year old female who is a primary care patient of Luevenia Saha, MD and is actively engaged with the care management team. I reached out to Dore Gambler by phone today to assist with re-scheduling  with the RN Case Manager.  Follow up plan: Telephone appointment with complex care management team member scheduled for:  05/23/2024  Kandis Ormond, CMA [place]  [facility], [facility] Guide Direct Dial: [phone]  Fax: [phone] Website: [email or web address]

## 2024-05-23 ENCOUNTER — Telehealth: Payer: Self-pay

## 2024-06-03 ENCOUNTER — Ambulatory Visit: Payer: Medicare Other | Admitting: Cardiology

## 2024-06-09 ENCOUNTER — Encounter: Payer: Self-pay | Admitting: Pulmonary Disease

## 2024-06-09 ENCOUNTER — Ambulatory Visit: Admitting: Pulmonary Disease

## 2024-06-09 VITALS — BP 112/72 | HR 88 | Ht 68.0 in | Wt 122.2 lb

## 2024-06-09 DIAGNOSIS — J431 Panlobular emphysema: Secondary | ICD-10-CM | POA: Diagnosis not present

## 2024-06-09 DIAGNOSIS — Z87891 Personal history of nicotine dependence: Secondary | ICD-10-CM | POA: Diagnosis not present

## 2024-06-09 NOTE — Telephone Encounter (Signed)
 Please contact patient and schedule Dupixent injection ASAP - she has not heard back from office. She states she was approved via [employer]. Please confirm prior to scheduling injection.

## 2024-06-09 NOTE — Progress Notes (Signed)
 @Patient  ID: Sherry Zimmerman, female    DOB: 09-08-1956, 68 y.o.   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  Most recent cardiology note reviewed.  Multiple PCP notes reviewed.  Multiple telephone encounters from our office reviewed.  At last visit, plan to start Dupixent.  Per documentation from our office, this was approved with a total dollar co-pay.  Patient was contacted to enroll in Dupixent my way.  There are 2 outgoing messages requesting patient contact us  back when she hears if she is approved.  There is no documentation that we were contacted back from the patient.  Patient states that she got approval for this.  She has a Agricultural engineer.  She cannot find it today.  She and husband understandably frustrated because they thought she would get the injection today.  I explained this appointment was scheduled in follow-up for ongoing chronic problems as well as to see reaction or improvement after Dupixent.  Obviously because it was not started.  I sent a message to our pharmacy colleagues to investigate the status of Dupixent.  HPI initial visit: Short of breath for about a year now.  Gradually worsening.  Worse on inclines or stairs.  No time of day when better or worse.  No position makes his better or worse.  No environmental or seasonal factors she can identify things better or worse.  Uses inhaler Bevespi  and albuterol .  Both infrequently.  Not using Bevespi  twice daily.  They seem to help a little bit.  No other alleviating or exacerbating factors.  Reviewed most recent chest image CT scan low-dose lung cancer screening 08/2023 that on my review interpretation shows severe emphysema with most severe in the upper lobes but significant emphysema in the middle lobe on the right and bilateral lower lobes,  scattered small nodules lung RADS 3 recommended 64-month follow-up.  This was relayed to her and repeat CT scan has been ordered.  Discussed role and rationale for inhaler therapy and importance of adherence.  Discussed role and rationale for escalating inhalers in the future if needed.  Discussed role and rationale for additional testing to help understand current state of lungs and achieve the most accurate diagnosis we can help her in the future.  Questionaires / Pulmonary Flowsheets:   ACT:      No data to display          MMRC:     No data to display          Epworth:      No data to display          Tests:   FENO:  No results found for: NITRICOXIDE  PFT:    Latest Ref Rng & Units 02/24/2024    1:21 PM 05/24/2018    2:49 PM  PFT Results  FVC-Pre L 2.36  3.50   FVC-Predicted Pre % 69  91   FVC-Post L 2.59  3.41   FVC-Predicted Post % 76  88   Pre FEV1/FVC % % 42  52   Post FEV1/FCV % % 45  53   FEV1-Pre L 0.98  1.82   FEV1-Predicted Pre % 37  61   FEV1-Post L 1.16  1.80   DLCO uncorrected ml/min/mmHg 9.85  14.24   DLCO  UNC% % 45  45   DLCO corrected ml/min/mmHg 10.78    DLCO COR %Predicted % 49    DLVA Predicted % 59  59   TLC L 6.49  6.30   TLC % Predicted % 117  108   RV % Predicted % 150  136   Personally viewed and interpreted as severe nasal obstruction, lung volumes with air trapping, DLCO severely reduced.  Compared to 2019 obstruction now severe with stable lung volumes DLCO.  WALK:      No data to display          Imaging: Personally reviewed and as per EMR and discussion in this note No results found.   Lab Results: Personally reviewed CBC    Component Value Date/Time   WBC 14.6 (H) 03/30/2024 1314   RBC 4.45 03/30/2024 1314   HGB 12.6 03/30/2024 1314   HGB 10.9 (L) 12/31/2023 1222   HCT 39.5 03/30/2024 1314   HCT 35.1 12/31/2023 1222   PLT 548.0 (H) 03/30/2024 1314   PLT 793 (H) 12/31/2023 1222   MCV 88.6 03/30/2024  1314   MCV 84 12/31/2023 1222   MCH 26.0 (L) 12/31/2023 1222   MCH 25.3 (L) 12/17/2023 0220   MCHC 32.0 03/30/2024 1314   RDW 16.5 (H) 03/30/2024 1314   RDW 16.8 (H) 12/31/2023 1222   LYMPHSABS 2.3 03/30/2024 1314   MONOABS 0.8 03/30/2024 1314   EOSABS 0.2 03/30/2024 1314   BASOSABS 0.1 03/30/2024 1314    BMET    Component Value Date/Time   NA 140 03/30/2024 1314   K 3.2 (L) 03/30/2024 1314   CL 101 03/30/2024 1314   CO2 29 03/30/2024 1314   GLUCOSE 150 (H) 03/30/2024 1314   BUN 13 03/30/2024 1314   CREATININE 0.93 03/30/2024 1314   CREATININE 0.80 06/18/2018 1353   CALCIUM  9.3 03/30/2024 1314   GFRNONAA >60 12/16/2023 0226   GFRNONAA >60 06/18/2018 1353   GFRAA >60 06/18/2018 1353    BNP    Component Value Date/Time   BNP 88.0 12/13/2023 0424    ProBNP No results found for: PROBNP  Specialty Problems       Pulmonary Problems   AR (allergic rhinitis)   Emphysema of lung (HCC)   Chest ct for lung ca screening 2021       Allergies  Allergen Reactions   Morphine     Unknown reaction   Sulfa Antibiotics Nausea And Vomiting and Other (See Comments)    Made me sick    Immunization History  Administered Date(s) Administered   DTaP / HiB 11/09/2014   Fluad Quad(high Dose 65+) 10/20/2022   Fluad Trivalent(High Dose 65+) 10/07/2023   HIB (PRP-T) 11/09/2014   Influenza Split 11/19/2015   Influenza, Seasonal, Injecte, Preservative Fre 10/20/2014   Influenza,inj,Quad PF,6+ Mos 10/10/2016, 09/11/2017, 08/13/2018, 08/29/2019   Influenza-Unspecified 09/26/2017, 10/09/2020   Janssen (J&J) SARS-COV-2 Vaccination 03/15/2020   Meningococcal Conjugate 11/09/2014, 01/05/2015   Pneumococcal Conjugate-13 11/09/2014   Pneumococcal Polysaccharide-23 05/02/2015, 05/23/2020   Tdap 12/08/2008   Zoster Recombinant(Shingrix ) 08/31/2019, 12/26/2019    Past Medical History:  Diagnosis Date   Anxiety    AR (allergic rhinitis)    Cerebellar ataxia (HCC) 2010   COPD  (chronic obstructive pulmonary disease) (HCC)    Depression    Iron deficiency anemia due to chronic blood loss 06/18/2018   Major depression, recurrent, chronic (HCC) 08/13/2018   Managed with effexor , chronically   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    Tobacco History: Social History   Tobacco Use  Smoking Status Former   Average packs/day: 1 pack/day for 50.0 years (50.0 ttl pk-yrs)   Types: Cigarettes   Start date: 07/2021   Quit date: 12/08/1978   Years since quitting: 45.5  Smokeless Tobacco Never  Tobacco Comments   Quit Oct 2024 01/13/2023 hfb 01/14/2024   Counseling given: Not Answered Tobacco comments: Quit Oct 2024 01/13/2023 hfb 01/14/2024   Continue to not smoke  Outpatient Encounter Medications as of 06/09/2024  Medication Sig   albuterol  (PROVENTIL ) (2.5 MG/3ML) 0.083% nebulizer solution Take 3 mLs (2.5 mg total) by nebulization every 6 (six) hours as needed for wheezing or shortness of breath.   albuterol  (VENTOLIN  HFA) 108 (90 Base) MCG/ACT inhaler Inhale 1-2 puffs into the lungs every 4 (four) hours as needed for wheezing or shortness of breath.   alendronate  (FOSAMAX ) 70 MG tablet Take 1 tablet (70 mg total) by mouth every 7 (seven) days. Take with a full glass of water on an empty stomach.   aspirin  EC 81 MG tablet Take 1 tablet (81 mg total) by mouth daily. Swallow whole.   atorvastatin  (LIPITOR ) 80 MG tablet Take 1 tablet (80 mg total) by mouth daily.   Budeson-Glycopyrrol-Formoterol  (BREZTRI  AEROSPHERE) 160-9-4.8 MCG/ACT AERO Inhale 2 puffs into the lungs in the morning and at bedtime.   calcium -vitamin D  (OSCAL WITH D) 500-5 MG-MCG tablet Take 1 tablet by mouth daily with breakfast.    cyanocobalamin  (VITAMIN B12) 500 MCG tablet Take 500 mcg by mouth daily.   ferrous sulfate  325 (65 FE) MG EC tablet Take 1 tablet (325 mg total) by mouth daily with breakfast.   furosemide  (LASIX ) 20 MG tablet Take 1 tablet (20 mg total) by mouth daily.   metoprolol  succinate (TOPROL -XL) 25 MG 24 hr tablet Take 1 tablet (25 mg total) by mouth daily.   oxybutynin  (DITROPAN ) 5 MG tablet Take 1 tablet (5 mg total) by mouth 2 (two) times daily.   OXYGEN Inhale 4 L into the lungs continuous.   potassium chloride  SA (KLOR-CON  M) 20 MEQ tablet Take 1 tablet (20 mEq total) by mouth daily as needed. Take with furosemide    prasugrel  (EFFIENT ) 10 MG TABS tablet Take 1 tablet (10 mg total) by mouth daily.   valACYclovir  (VALTREX ) 1000 MG tablet Take 2 tablets (2,000 mg total) by mouth 2 (two) times daily. Once, as needed for cold sores   venlafaxine  XR (EFFEXOR -XR) 150 MG 24 hr capsule TAKE 1 CAPSULE BY MOUTH AT BEDTIME **TAKE  WITH  75MG   CAPSULES  FOR  225MG   TOTAL  DAILY  DOSE (Patient taking differently: Take 150 mg by mouth at bedtime.)   venlafaxine  XR (EFFEXOR -XR) 75 MG 24 hr capsule TAKE 1 CAPSULE BY MOUTH ONCE DAILY **TAKE  WITH  150MG   CAPSULES  FOR  225MG   TOTAL  DAILY  DOSE (Patient taking differently: Take 75 mg by mouth at bedtime. TAKE 1 CAPSULE BY MOUTH ONCE DAILY **TAKE  WITH  150MG   CAPSULES  FOR  225MG   TOTAL  DAILY  DOSE)   No facility-administered encounter medications on file as of 06/09/2024.     Review of Systems  Review of Systems  N/a Physical Exam  BP 112/72 (BP Location: Right Arm, Patient Position: Sitting, Cuff Size: Normal)   Pulse 88   Ht 5' 8 (1.727 m)   Wt 122 lb 3.2 oz (55.4 kg)   SpO2 95%   BMI 18.58 kg/m   Wt Readings from Last 5 Encounters:  06/09/24 122 lb 3.2 oz (55.4 kg)  03/30/24 126 lb 12.8 oz (57.5 kg)  03/21/24 127 lb (57.6 kg)  03/17/24 126 lb (57.2 kg)  02/24/24 124 lb (56.2 kg)    BMI Readings from Last 5 Encounters:  06/09/24 18.58 kg/m   03/30/24 20.47 kg/m  03/21/24 20.50 kg/m  03/17/24 20.34 kg/m  02/24/24 19.42 kg/m     Physical Exam General: Sitting in chair, no acute distress Eyes: EOMI, no icterus Neck: Supple, no JVD appreciated Pulmonary: Distant, clear, normal work of breathing Cardiovascular: Warm, no edema, regular rate and rhythm MSK: No synovitis, no joint effusion Neuro: Normal gait, no weakness Psych: Normal mood, full affect   Assessment & Plan:   Dyspnea on exertion: High suspicion for smoking-related lung disease given severe emphysema seen on CT scan.  Prior PFTs 2020 with very mild COPD.  Repeat PFTs 02/2024 with very severe COPD.  This is likely primary driver of symptoms.  Emphysema with severe COPD: FEV1 20%, very severe obstruction on PFTs 01/2024.  Escalated to Breztri  with exacerbation in late 2024.  Continue Breztri .  Eosinophils elevated in the past and ongoing symptoms.  Awaiting Dupixent approval, it sounds like Dupixent [employer] pharmacy assistance was approved.  But our office has no documentation of this.  I sent a message to follow-up and get this scheduled ASAP.  Chronic hypoxemic respiratory failure: Related to emphysema, COPD.  Continue oxygen supplementation.  Goal O2 sats 88% or greater.   No follow-ups on file.  Schedule follow-up with Dr. Annella at time of Dupixent injection once scheduled.   Sherry JONELLE Annella, MD 06/09/2024  I spent 42 minutes in the care of patient occluding face-to-face visit, coordination of care, review of records.

## 2024-06-10 ENCOUNTER — Encounter (INDEPENDENT_AMBULATORY_CARE_PROVIDER_SITE_OTHER): Payer: Self-pay

## 2024-06-10 ENCOUNTER — Encounter: Payer: Self-pay | Admitting: Pulmonary Disease

## 2024-06-13 NOTE — Telephone Encounter (Signed)
 Called [employer] for update on PAP. Per rep patient was approved TODAY  Received a verbal confirmation from  Dup[employer]ent  [employer] regarding an approval for DUPIXENT  patient assistance from 06/13/2024 to 12/07/2024. Approval letter will be sent by company  Phone #: [phone] Fax #: [phone]  Patient scheduled for Dupixent  new start on 06/14/2024  Yaqub Arney, PharmD, MPH, BCPS, CPP Clinical Pharmacist (Rheumatology and Pulmonology)

## 2024-06-13 NOTE — Telephone Encounter (Signed)
 Dupixent  message

## 2024-06-13 NOTE — Telephone Encounter (Signed)
 Patient was only approved for Dupixent  [employer] patient assistance as of today. Scheduled for DPX new start on 06/14/24

## 2024-06-14 ENCOUNTER — Ambulatory Visit: Admitting: Pharmacist

## 2024-06-14 DIAGNOSIS — J431 Panlobular emphysema: Secondary | ICD-10-CM | POA: Diagnosis not present

## 2024-06-14 DIAGNOSIS — Z7189 Other specified counseling: Secondary | ICD-10-CM | POA: Diagnosis not present

## 2024-06-14 DIAGNOSIS — Z87891 Personal history of nicotine dependence: Secondary | ICD-10-CM | POA: Diagnosis not present

## 2024-06-14 DIAGNOSIS — J449 Chronic obstructive pulmonary disease, unspecified: Secondary | ICD-10-CM | POA: Diagnosis not present

## 2024-06-14 MED ORDER — DUPIXENT 300 MG/2ML ~~LOC~~ SOAJ: 300.0000 mg | SUBCUTANEOUS | 1 refills | Status: AC

## 2024-06-14 NOTE — Progress Notes (Addendum)
 HPI Patient presents today to [facility] to see pharmacy team for Dupixent  new start for COPD. She is accompanied by her partner today who will be administering medication for her. Past medical history includes NSTEMI, CAD, migraines, allergic rhinitis  Respiratory Medications Current regimen: Breztri  160-9-4.8mcg (2 puffs twice daily) Patient reports no known adherence challenges  OBJECTIVE Allergies  Allergen Reactions   Morphine     Unknown reaction   Sulfa Antibiotics Nausea And Vomiting and Other (See Comments)    Made me sick    Outpatient Encounter Medications as of 06/14/2024  Medication Sig Note   albuterol  (PROVENTIL ) (2.5 MG/3ML) 0.083% nebulizer solution Take 3 mLs (2.5 mg total) by nebulization every 6 (six) hours as needed for wheezing or shortness of breath.    albuterol  (VENTOLIN  HFA) 108 (90 Base) MCG/ACT inhaler Inhale 1-2 puffs into the lungs every 4 (four) hours as needed for wheezing or shortness of breath. 12/18/2023: Sig other unable to locate inhaler in home-will call pharmacy to get refill   alendronate  (FOSAMAX ) 70 MG tablet Take 1 tablet (70 mg total) by mouth every 7 (seven) days. Take with a full glass of water on an empty stomach.    aspirin  EC 81 MG tablet Take 1 tablet (81 mg total) by mouth daily. Swallow whole.    atorvastatin  (LIPITOR ) 80 MG tablet Take 1 tablet (80 mg total) by mouth daily.    Budeson-Glycopyrrol-Formoterol  (BREZTRI  AEROSPHERE) 160-9-4.8 MCG/ACT AERO Inhale 2 puffs into the lungs in the morning and at bedtime.    calcium -vitamin D  (OSCAL WITH D) 500-5 MG-MCG tablet Take 1 tablet by mouth daily with breakfast.    cyanocobalamin  (VITAMIN B12) 500 MCG tablet Take 500 mcg by mouth daily.    ferrous sulfate  325 (65 FE) MG EC tablet Take 1 tablet (325 mg total) by mouth daily with breakfast.    furosemide  (LASIX ) 20 MG tablet Take 1 tablet (20 mg total) by mouth daily.    metoprolol  succinate (TOPROL -XL) 25 MG 24 hr tablet Take 1  tablet (25 mg total) by mouth daily.    oxybutynin  (DITROPAN ) 5 MG tablet Take 1 tablet (5 mg total) by mouth 2 (two) times daily.    OXYGEN Inhale 4 L into the lungs continuous.    potassium chloride  SA (KLOR-CON  M) 20 MEQ tablet Take 1 tablet (20 mEq total) by mouth daily as needed. Take with furosemide     prasugrel  (EFFIENT ) 10 MG TABS tablet Take 1 tablet (10 mg total) by mouth daily.    valACYclovir  (VALTREX ) 1000 MG tablet Take 2 tablets (2,000 mg total) by mouth 2 (two) times daily. Once, as needed for cold sores    venlafaxine  XR (EFFEXOR -XR) 150 MG 24 hr capsule TAKE 1 CAPSULE BY MOUTH AT BEDTIME **TAKE  WITH  75MG   CAPSULES  FOR  225MG   TOTAL  DAILY  DOSE (Patient taking differently: Take 150 mg by mouth at bedtime.)    venlafaxine  XR (EFFEXOR -XR) 75 MG 24 hr capsule TAKE 1 CAPSULE BY MOUTH ONCE DAILY **TAKE  WITH  150MG   CAPSULES  FOR  225MG   TOTAL  DAILY  DOSE (Patient taking differently: Take 75 mg by mouth at bedtime. TAKE 1 CAPSULE BY MOUTH ONCE DAILY **TAKE  WITH  150MG   CAPSULES  FOR  225MG   TOTAL  DAILY  DOSE)    No facility-administered encounter medications on file as of 06/14/2024.     Immunization History  Administered Date(s) Administered   DTaP / HiB 11/09/2014  Fluad Quad(high Dose 65+) 10/20/2022   Fluad Trivalent(High Dose 65+) 10/07/2023   HIB (PRP-T) 11/09/2014   Influenza Split 11/19/2015   Influenza, Seasonal, Injecte, Preservative Fre 10/20/2014   Influenza,inj,Quad PF,6+ Mos 10/10/2016, 09/11/2017, 08/13/2018, 08/29/2019   Influenza-Unspecified 09/26/2017, 10/09/2020   Janssen (J&J) SARS-COV-2 Vaccination 03/15/2020   Meningococcal Conjugate 11/09/2014, 01/05/2015   Pneumococcal Conjugate-13 11/09/2014   Pneumococcal Polysaccharide-23 05/02/2015, 05/23/2020   Tdap 12/08/2008   Zoster Recombinant(Shingrix ) 08/31/2019, 12/26/2019     PFTs    Latest Ref Rng & Units 02/24/2024    1:21 PM 05/24/2018    2:49 PM  PFT Results  FVC-Pre L 2.36  3.50    FVC-Predicted Pre % 69  91   FVC-Post L 2.59  3.41   FVC-Predicted Post % 76  88   Pre FEV1/FVC % % 42  52   Post FEV1/FCV % % 45  53   FEV1-Pre L 0.98  1.82   FEV1-Predicted Pre % 37  61   FEV1-Post L 1.16  1.80   DLCO uncorrected ml/min/mmHg 9.85  14.24   DLCO UNC% % 45  45   DLCO corrected ml/min/mmHg 10.78    DLCO COR %Predicted % 49    DLVA Predicted % 59  59   TLC L 6.49  6.30   TLC % Predicted % 117  108   RV % Predicted % 150  136     Eosinophils Most recent blood eosinophil count was 200 cells/microL taken on 03/30/2024.   Assessment   Biologics training for dupilumab  (Dupixent )  Goals of therapy: Mechanism: human monoclonal IgG4 antibody that inhibits interleukin-4 and interleukin-13 cytokine-induced responses, including release of proinflammatory cytokines, chemokines, and IgE Reviewed that Dupixent  is add-on medication and patient must continue maintenance inhaler regimen. Response to therapy: may take 4 months to determine efficacy. Discussed that patients generally feel improvement sooner than 4 months.  Side effects: injection site reaction (6-18%), antibody development (5-16%), ophthalmic conjunctivitis (2-16%), transient blood eosinophilia (1-2%)  Dose: 300mg  every 14 days (for COPD)  Administration/Storage:  Reviewed administration sites of thigh or abdomen (at least 2-3 inches away from abdomen). Reviewed the upper arm is only appropriate if caregiver is administering injection  Do not shake pen/syringe as this could lead to product foaming or precipitation. Do not use if solution is discolored or contains particulate matter or if window on prefilled pen is yellow (indicates pen has been used).  Reviewed storage of medication in refrigerator. Reviewed that Dupixent  can be stored at room temperature in unopened carton for up to 14 days.  Access: Approval of Dupixent  through: patient assistance  Patient's partner administered Dupixent  300mg /10mL in right  lower abdomen using sample: Dupixent  300mg /3mL autoinjector pen NDC: [phone] Lot: [id number] Expiration: 07  Patient monitored for 30 minutes for adverse reaction.  Patient tolerated well but with some discomfort.  Injection site noted. Patient denies itchiness and irritation at injection., No swelling or redness noted., and Reviewed injection site reaction management with patient verbally and printed information for review in AVS  Medication Reconciliation  A drug regimen assessment was performed, including review of allergies, interactions, disease-state management, dosing and immunization history. Medications were reviewed with the patient, including name, instructions, indication, goals of therapy, potential side effects, importance of adherence, and safe use.  Drug interaction(s): none noted  PLAN Continue Dupixent  300mg  every 14 days.  Next dose is due 06/28/2024 and every 14 days thereafter. Rx sent to: [facility]: [phone].  Patient provided with pharmacy phone number and  advised to call later this week to schedule shipment to home.  Continue maintenance inhaler regimen of: Breztri  160-9-4.8mcg (2 puffs twice daily)  All questions encouraged and answered.  Instructed patient to reach out with any further questions or concerns.  Thank you for allowing pharmacy to participate in this patient's care.  This appointment required 45 minutes of patient care (this includes precharting, chart review, review of results, face-to-face care, etc.).   Sherry Pennant, PharmD, MPH, BCPS, CPP Clinical Pharmacist (Rheumatology and Pulmonology)

## 2024-06-14 NOTE — Patient Instructions (Signed)
 Your next DUPIXENT  dose is due on 06/28/2024, 07/12/2024, and every 14 days thereafter  CONTINUE Breztri  two puffs twice daily  Your prescription will be shipped from [facility]. Their phone number is [phone] Please call to schedule shipment and confirm address. They will mail your medication to your home.  You will need to be seen by your provider in 3 to 4 months to assess how DUPIXENT  is working for you. Please ensure you have a follow-up appointment scheduled in October or November 2025. Call our clinic if you need to make this appointment.  Stay up to date on all routine vaccines: influenza, pneumonia, COVID19, Shingles  How to manage an injection site reaction: Remember the 5 C's: COUNTER - leave on the counter at least 30 minutes but up to overnight to bring medication to room temperature. This may help prevent stinging COLD - place something cold (like an ice gel pack or cold water bottle) on the injection site just before cleansing with alcohol. This may help reduce pain CLARITIN - use Claritin (generic name is loratadine) for the first two weeks of treatment or the day of, the day before, and the day after injecting. This will help to minimize injection site reactions CORTISONE CREAM - apply if injection site is irritated and itching CALL ME - if injection site reaction is bigger than the size of your fist, looks infected, blisters, or if you develop hives

## 2024-06-29 NOTE — Addendum Note (Signed)
 Addended by: DAYNE SHERRY RAMAN on: 06/29/2024 10:11 AM   Modules accepted: Level of Service

## 2024-06-30 ENCOUNTER — Telehealth: Payer: Self-pay | Admitting: *Deleted

## 2024-06-30 NOTE — Progress Notes (Signed)
 Complex Care Management Care Guide Note  06/30/2024 Name: Sherry Zimmerman MRN: [id number] DOB: [birth year]  Sherry Zimmerman Ambrose is a 68 y.o. year old female who is a primary care patient of Jodie Lavern LITTIE, MD and is actively engaged with the care management team. I reached out to Sherry Zimmerman by phone today to assist with re-scheduling  with the RN Case Manager.  Follow up plan: Unsuccessful telephone outreach attempt made. A HIPAA compliant phone message was left for the patient providing contact information and requesting a return call. No further outreach attempts will be made due to inability to maintain patient contact.   Thedford Franks, CMA [place]  [facility], [facility] Guide Direct Dial: [phone]  Fax: [phone] Website: [email or web address]

## 2024-07-20 ENCOUNTER — Other Ambulatory Visit: Payer: Self-pay | Admitting: Cardiology

## 2024-07-20 DIAGNOSIS — Z955 Presence of coronary angioplasty implant and graft: Secondary | ICD-10-CM

## 2024-07-27 ENCOUNTER — Ambulatory Visit (INDEPENDENT_AMBULATORY_CARE_PROVIDER_SITE_OTHER)

## 2024-07-27 VITALS — Ht 68.0 in | Wt 122.0 lb

## 2024-07-27 DIAGNOSIS — Z0000 Encounter for general adult medical examination without abnormal findings: Secondary | ICD-10-CM | POA: Diagnosis not present

## 2024-07-27 NOTE — Patient Instructions (Signed)
 Ms. Sieh , Thank you for taking time out of your busy schedule to complete your Annual Wellness Visit with me. I enjoyed our conversation and look forward to speaking with you again next year. I, as well as your care team,  appreciate your ongoing commitment to your health goals. Please review the following plan we discussed and let me know if I can assist you in the future. Your Game plan/ To Do List    Referrals: If you haven't heard from the office you've been referred to, please reach out to them at the phone provided.   Follow up Visits: We will see or speak with you next year for your Next Medicare AWV with our clinical staff Have you seen your provider in the last 6 months (3 months if uncontrolled diabetes)? Yes  Clinician Recommendations:  Each day, aim for 6 glasses of water, plenty of protein in your diet and try to get up and walk/ stretch every hour for 5-10 minutes at a time.        This is a list of the screenings recommended for you:  Health Maintenance  Topic Date Due   COVID-19 Vaccine (2 - Janssen risk series) 04/12/2020   Flu Shot  07/08/2024   Medicare Annual Wellness Visit  08/24/2024   Colon Cancer Screening  09/26/2024*   DEXA scan (bone density measurement)  10/06/2024*   Mammogram  09/02/2024   DTaP/Tdap/Td vaccine (3 - Td or Tdap) 11/09/2024   Screening for Lung Cancer  12/12/2024   Pneumococcal Vaccine for age over 32  Completed   Hepatitis C Screening  Completed   Zoster (Shingles) Vaccine  Completed   HPV Vaccine  Aged Out   Meningitis B Vaccine  Aged Out   Pneumococcal Vaccine  Discontinued  *Topic was postponed. The date shown is not the original due date.    Advanced directives: (Copy Requested) Please bring a copy of your health care power of attorney and living will to the office to be added to your chart at your convenience. You can mail to [facility] [address] [place], [place] [address] or email to  [email or web address] Advance Care Planning is important because it:  [x]  Makes sure you receive the medical care that is consistent with your values, goals, and preferences  [x]  It provides guidance to your family and loved ones and reduces their decisional burden about whether or not they are making the right decisions based on your wishes.  Follow the link provided in your after visit summary or read over the paperwork we have mailed to you to help you started getting your Advance Directives in place. If you need assistance in completing these, please reach out to us  so that we can help you!  See attachments for Preventive Care and Fall Prevention Tips.

## 2024-07-27 NOTE — Progress Notes (Signed)
 Subjective:   Sherry Zimmerman is a 68 y.o. who presents for a Medicare Wellness preventive visit.  As a reminder, Annual Wellness Visits don't include a physical exam, and some assessments may be limited, especially if this visit is performed virtually. We may recommend an in-person follow-up visit with your provider if needed.  Visit Complete: Virtual I connected with  Sherry Zimmerman on 07/27/24 by a audio enabled telemedicine application and verified that I am speaking with the correct person using two identifiers.  Patient Location: Home  Provider Location: Home Office  I discussed the limitations of evaluation and management by telemedicine. The patient expressed understanding and agreed to proceed.  Vital Signs: Because this visit was a virtual/telehealth visit, some criteria may be missing or patient reported. Any vitals not documented were not able to be obtained and vitals that have been documented are patient reported.  VideoDeclined- This patient declined Librarian, academic. Therefore the visit was completed with audio only.  Persons Participating in Visit: Patient.  AWV Questionnaire: Yes: Patient Medicare AWV questionnaire was completed by the patient on 07/26/24; I have confirmed that all information answered by patient is correct and no changes since this date.  Cardiac Risk Factors include: dyslipidemia;advanced age (>22men, >19 women);hypertension     Objective:    Today's Vitals   07/27/24 0923  Weight: 122 lb (55.3 kg)  Height: 5' 8 (1.727 m)   Body mass index is 18.55 kg/m.     07/27/2024    9:30 AM 12/13/2023    4:11 AM 11/13/2023   10:29 PM 11/13/2023    3:20 PM 08/25/2023    1:18 PM 06/29/2023    6:00 PM 06/29/2023   12:36 PM  Advanced Directives  Does Patient Have a Medical Advance Directive? Yes No No No No No No  Type of Estate agent of [place];Living will        Copy of Healthcare Power of  Attorney in Chart? No - copy requested        Would patient like information on creating a medical advance directive?  Yes (ED - send information to MyChart) No - Patient declined No - Patient declined No - Patient declined Yes (Inpatient - patient requests chaplain consult to create a medical advance directive) No - Patient declined    Current Medications (verified) Outpatient Encounter Medications as of 07/27/2024  Medication Sig   albuterol  (PROVENTIL ) (2.5 MG/3ML) 0.083% nebulizer solution Take 3 mLs (2.5 mg total) by nebulization every 6 (six) hours as needed for wheezing or shortness of breath.   albuterol  (VENTOLIN  HFA) 108 (90 Base) MCG/ACT inhaler Inhale 1-2 puffs into the lungs every 4 (four) hours as needed for wheezing or shortness of breath.   alendronate  (FOSAMAX ) 70 MG tablet Take 1 tablet (70 mg total) by mouth every 7 (seven) days. Take with a full glass of water on an empty stomach.   aspirin  EC 81 MG tablet Take 1 tablet (81 mg total) by mouth daily. Swallow whole.   atorvastatin  (LIPITOR ) 80 MG tablet Take 1 tablet (80 mg total) by mouth daily.   Budeson-Glycopyrrol-Formoterol  (BREZTRI  AEROSPHERE) 160-9-4.8 MCG/ACT AERO Inhale 2 puffs into the lungs in the morning and at bedtime.   calcium -vitamin D  (OSCAL WITH D) 500-5 MG-MCG tablet Take 1 tablet by mouth daily with breakfast.   cyanocobalamin  (VITAMIN B12) 500 MCG tablet Take 500 mcg by mouth daily.   Dupilumab  (DUPIXENT ) 300 MG/2ML SOAJ Inject 300 mg into the skin  every 14 (fourteen) days.   ferrous sulfate  325 (65 FE) MG EC tablet Take 1 tablet (325 mg total) by mouth daily with breakfast.   furosemide  (LASIX ) 20 MG tablet Take 1 tablet (20 mg total) by mouth daily.   metoprolol  succinate (TOPROL -XL) 25 MG 24 hr tablet Take 1 tablet (25 mg total) by mouth daily.   oxybutynin  (DITROPAN ) 5 MG tablet Take 1 tablet (5 mg total) by mouth 2 (two) times daily.   OXYGEN Inhale 4 L into the lungs continuous.   potassium chloride  SA  (KLOR-CON  M) 20 MEQ tablet Take 1 tablet (20 mEq total) by mouth daily as needed. Take with furosemide    prasugrel  (EFFIENT ) 10 MG TABS tablet Take 1 tablet by mouth once daily   valACYclovir  (VALTREX ) 1000 MG tablet Take 2 tablets (2,000 mg total) by mouth 2 (two) times daily. Once, as needed for cold sores   venlafaxine  XR (EFFEXOR -XR) 150 MG 24 hr capsule TAKE 1 CAPSULE BY MOUTH AT BEDTIME **TAKE  WITH  75MG   CAPSULES  FOR  225MG   TOTAL  DAILY  DOSE   venlafaxine  XR (EFFEXOR -XR) 75 MG 24 hr capsule TAKE 1 CAPSULE BY MOUTH ONCE DAILY **TAKE  WITH  150MG   CAPSULES  FOR  225MG   TOTAL  DAILY  DOSE   No facility-administered encounter medications on file as of 07/27/2024.    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   Past Surgical History:  Procedure Laterality Date   ABDOMINAL HYSTERECTOMY     CATARACT EXTRACTION Bilateral 01/17/2020   CORONARY STENT INTERVENTION N/A 12/15/2023   Procedure: CORONARY STENT INTERVENTION;  Surgeon: Anner Alm ORN, MD;  Location: [facility] INVASIVE CV LAB;  Service: Cardiovascular;  Laterality: N/A;   LEFT HEART CATH AND CORONARY ANGIOGRAPHY N/A 12/15/2023   Procedure: LEFT HEART CATH AND CORONARY ANGIOGRAPHY;  Surgeon: Anner Alm ORN, MD;  Location[facility] INVASIVE CV LAB;  Service: Cardiovascular;   Laterality: N/A;   SPLENECTOMY, TOTAL N/A 10/26/2014   Procedure: SPLENECTOMY;  Surgeon: Dann Hummer, MD;  Location: [facility] OR;  Service: General;  Laterality: N/A;   WRIST FRACTURE SURGERY Right    Family History  Problem Relation Age of Onset   Ataxia Mother    Stroke Mother    Heart disease Mother    Diabetes Mother    Cancer Father    Social History   Socioeconomic History   Marital status: Significant Other    Spouse name: Not on file   Number of children: 2   Years of education: Not on file   Highest education level: GED or equivalent  Occupational History   Occupation: disabled  Tobacco Use   Smoking status: Former    Average packs/day: 1 pack/day for 50.0 years (50.0 ttl pk-yrs)    Types: Cigarettes  Start date: 07/2021    Quit date: 12/08/1978    Years since quitting: 45.6   Smokeless tobacco: Never   Tobacco comments:    Quit Oct 2024 01/13/2023 hfb 01/14/2024  Vaping Use   Vaping status: Never Used  Substance and Sexual Activity   Alcohol use: Not Currently    Comment: socially   Drug use: No   Sexual activity: Not Currently  Other Topics Concern   Not on file  Social History Narrative   Not on file   Social Drivers of Health   Financial Resource Strain: Low Risk  (07/26/2024)   Overall Financial Resource Strain (CARDIA)    Difficulty of Paying Living Expenses: Not hard at all  Food Insecurity: No Food Insecurity (07/26/2024)   Hunger Vital Sign    Worried About Running Out of Food in the Last Year: Never true    Ran Out of Food in the Last Year: Never true  Transportation Needs: No Transportation Needs (07/26/2024)   PRAPARE - Administrator, Civil Service (Medical): No    Lack of Transportation (Non-Medical): No  Physical Activity: Inactive (07/26/2024)   Exercise Vital Sign    Days of Exercise per Week: 0 days    Minutes of Exercise per Session: 0 min  Stress: No Stress Concern Present (07/26/2024)   [employer] of Occupational  Health - Occupational Stress Questionnaire    Feeling of Stress: Not at all  Social Connections: Moderately Isolated (07/26/2024)   Social Connection and Isolation Panel    Frequency of Communication with Friends and Family: More than three times a week    Frequency of Social Gatherings with Friends and Family: Once a week    Attends Religious Services: Never    Database administrator or Organizations: No    Attends Banker Meetings: Not on file    Marital Status: Living with partner    Tobacco Counseling Counseling given: Not Answered Tobacco comments: Quit Oct 2024 01/13/2023 hfb 01/14/2024    Clinical Intake:  Pre-visit preparation completed: Yes  Pain : No/denies pain     BMI - recorded: 18.55 Nutritional Status: BMI <19  Underweight Diabetes: No  Lab Results  Component Value Date   HGBA1C 6.5 (H) 12/14/2023     How often do you need to have someone help you when you read instructions, pamphlets, or other written materials from your doctor or pharmacy?: 1 - Never  Interpreter Needed?: No  Information entered by :: Ellouise Haws, LPN   Activities of Daily Living     07/26/2024   11:31 AM 12/13/2023    2:09 PM  In your present state of health, do you have any difficulty performing the following activities:  Hearing? 0 0  Vision?  0  Difficulty concentrating or making decisions? 0 0  Walking or climbing stairs? 1   Comment uses walker   Dressing or bathing? 0   Doing errands, shopping? 1   Preparing Food and eating ? N   Using the Toilet? N   In the past six months, have you accidently leaked urine? Y   Comment at times   Do you have problems with loss of bowel control? N   Managing your Medications? N   Managing your Finances? N   Housekeeping or managing your Housekeeping? Y   Comment significant other     Patient Care Team: Jodie Lavern CROME, MD as PCP - General (Family Medicine) Michele Richardson, DO as PCP - Cardiology (  Cardiology) Jenel Arvin POUR., MD as Referring Physician (Psychiatry) Anner Alm ORN, MD as Consulting Physician (Cardiology) Hunsucker, Donnice SAUNDERS, MD as Consulting Physician (Pulmonary Disease)  I have updated your Care Teams any recent Medical Services you may have received from other providers in the past year.     Assessment:   This is a routine wellness examination for Sherry Zimmerman.  Hearing/Vision screen Hearing Screening - Comments:: Pt denies any hearing issues  Vision Screening - Comments:: Wears rx glasses - up to date with routine eye exams with My [facility]    Mattie Addressed             This Visit's Progress    Patient Stated       Stay as healthy as can        Depression Screen     07/27/2024    9:29 AM 03/30/2024   12:49 PM 03/22/2024    1:34 PM 03/03/2024    3:47 PM 12/21/2023   10:25 AM 08/25/2023    1:16 PM 07/13/2023    2:07 PM  PHQ 2/9 Scores  PHQ - 2 Score 0 0 0 0 0 0 0  PHQ- 9 Score  0    0 0    Fall Risk     07/26/2024   11:31 AM 03/22/2024    1:32 PM 03/17/2024    3:36 PM 03/03/2024    3:51 PM 02/15/2024    3:39 PM  Fall Risk   Falls in the past year? 1 1 1 1  0  Number falls in past yr: 1  1 0   Injury with Fall? 0 1 1 1    Comment  ED visit 03/02/24 to assess wrist pain, xray was unremarkable.     Risk for fall due to : Impaired balance/gait;Impaired mobility;History of fall(s) History of fall(s);Impaired balance/gait Impaired balance/gait;Impaired mobility Impaired balance/gait   Follow up Falls prevention discussed   Falls prevention discussed     MEDICARE RISK AT HOME:  Medicare Risk at Home Any stairs in or around the home?: (Patient-Rptd) Yes If so, are there any without handrails?: (Patient-Rptd) No Home free of loose throw rugs in walkways, pet beds, electrical cords, etc?: Yes Adequate lighting in your home to reduce risk of falls?: (Patient-Rptd) Yes Life alert?: (Patient-Rptd) No Use of a cane, walker or w/c?: (Patient-Rptd) Yes Grab bars in the bathroom?:  (Patient-Rptd) Yes Shower chair or bench in shower?: (Patient-Rptd) Yes Elevated toilet seat or a handicapped toilet?: (Patient-Rptd) No  TIMED UP AND GO:  Was the test performed?  No  Cognitive Function: 6CIT completed        07/27/2024    9:31 AM 08/25/2023    1:19 PM 08/22/2022    1:10 PM 08/09/2021    2:37 PM  6CIT Screen  What Year? 0 points 0 points 0 points 0 points  What month? 0 points 0 points 0 points 0 points  What time? 0 points 0 points 0 points 0 points  Count back from 20 0 points 0 points 0 points 0 points  Months in reverse 0 points 0 points 0 points 0 points  Repeat phrase 0 points 0 points 0 points 0 points  Total Score 0 points 0 points 0 points 0 points    Immunizations Immunization History  Administered Date(s) Administered   DTaP / HiB 11/09/2014   Fluad Quad(high Dose 65+) 10/20/2022   Fluad Trivalent(High Dose 65+) 10/07/2023   HIB (PRP-T) 11/09/2014   Influenza Split 11/19/2015  Influenza, Seasonal, Injecte, Preservative Fre 10/20/2014   Influenza,inj,Quad PF,6+ Mos 10/10/2016, 09/11/2017, 08/13/2018, 08/29/2019   Influenza-Unspecified 09/26/2017, 10/09/2020   Janssen (J&J) SARS-COV-2 Vaccination 03/15/2020   Meningococcal Conjugate 11/09/2014, 01/05/2015   Pneumococcal Conjugate-13 11/09/2014   Pneumococcal Polysaccharide-23 05/02/2015, 05/23/2020   Tdap 12/08/2008   Zoster Recombinant(Shingrix ) 08/31/2019, 12/26/2019    Screening Tests Health Maintenance  Topic Date Due   COVID-19 Vaccine (2 - Janssen risk series) 04/12/2020   INFLUENZA VACCINE  07/08/2024   Colonoscopy  09/26/2024 (Originally 03/09/2023)   DEXA SCAN  10/06/2024 (Originally 04/03/2023)   MAMMOGRAM  09/02/2024   DTaP/Tdap/Td (3 - Td or Tdap) 11/09/2024   Lung Cancer Screening  12/12/2024   Medicare Annual Wellness (AWV)  07/27/2025   Pneumococcal Vaccine: 50+ Years  Completed   Hepatitis C Screening  Completed   Zoster Vaccines- Shingrix   Completed   HPV VACCINES  Aged  Out   Meningococcal B Vaccine  Aged Out   Pneumococcal Vaccine  Discontinued    Health Maintenance  Health Maintenance Due  Topic Date Due   COVID-19 Vaccine (2 - Janssen risk series) 04/12/2020   INFLUENZA VACCINE  07/08/2024   Health Maintenance Items Addressed: See Nurse Notes at the end of this note  Additional Screening:  Vision Screening: Recommended annual ophthalmology exams for early detection of glaucoma and other disorders of the eye. Would you like a referral to an eye doctor? No    Dental Screening: Recommended annual dental exams for proper oral hygiene  Community Resource Referral / Chronic Care Management: CRR required this visit?  No   CCM required this visit?  No   Plan:    I have personally reviewed and noted the following in the patient's chart:   Medical and social history Use of alcohol, tobacco or illicit drugs  Current medications and supplements including opioid prescriptions. Patient is not currently taking opioid prescriptions. Functional ability and status Nutritional status Physical activity Advanced directives List of other physicians Hospitalizations, surgeries, and ER visits in previous 12 months Vitals Screenings to include cognitive, depression, and falls Referrals and appointments  In addition, I have reviewed and discussed with patient certain preventive protocols, quality metrics, and best practice recommendations. A written personalized care plan for preventive services as well as general preventive health recommendations were provided to patient.   Ellouise VEAR Haws, LPN   1/79/7974   After Visit Summary: (MyChart) Due to this being a telephonic visit, the after visit summary with patients personalized plan was offered to patient via MyChart   Notes: Nothing significant to report at this time.

## 2024-08-22 ENCOUNTER — Ambulatory Visit (HOSPITAL_BASED_OUTPATIENT_CLINIC_OR_DEPARTMENT_OTHER)
Admission: RE | Admit: 2024-08-22 | Discharge: 2024-08-22 | Disposition: A | Discharge status: Home / Self Care | Source: Ambulatory Visit | Attending: Acute Care | Admitting: Acute Care

## 2024-08-22 DIAGNOSIS — Z122 Encounter for screening for malignant neoplasm of respiratory organs: Secondary | ICD-10-CM | POA: Diagnosis not present

## 2024-08-22 DIAGNOSIS — Z87891 Personal history of nicotine dependence: Secondary | ICD-10-CM | POA: Diagnosis not present

## 2024-09-01 ENCOUNTER — Telehealth: Payer: Self-pay

## 2024-09-01 DIAGNOSIS — Z87891 Personal history of nicotine dependence: Secondary | ICD-10-CM

## 2024-09-01 DIAGNOSIS — R911 Solitary pulmonary nodule: Secondary | ICD-10-CM

## 2024-09-01 NOTE — Telephone Encounter (Signed)
 LVM for patient to call office and review results.  IMPRESSION: 1. New 4.7 mm posterior segment right upper lobe nodule. Lung-RADS 3, probably benign findings. Short-term follow-up in 6 months is recommended with repeat low-dose chest CT without contrast (please use the following order, CT CHEST LCS NODULE FOLLOW-UP W/O CM). These results will be called to the ordering clinician or representative by the Radiologist Assistant, and communication documented in the PACS or [employer]. 2. Small left renal stone. 3. Aortic atherosclerosis (ICD10-I70.0). Coronary artery calcification. 4.  Emphysema (ICD10-J43.9).

## 2024-09-02 ENCOUNTER — Telehealth: Payer: Self-pay | Admitting: *Deleted

## 2024-09-02 NOTE — Telephone Encounter (Signed)
 Message has been left for patient to return call in another encounter closing message.

## 2024-09-05 NOTE — Telephone Encounter (Signed)
 Spoke with pt and reviewed lung screening CT results. New nodule seen with recommendation to repeat CT in 6 months. Pt verbalized understanding. Results/ plans faxed to PCP. Order placed for 6 month nodule follow up CT.

## 2024-09-05 NOTE — Addendum Note (Signed)
 Addended by: ANITRA AQUAS D on: 09/05/2024 11:36 AM   Modules accepted: Orders

## 2024-09-05 NOTE — Telephone Encounter (Signed)
 Copied from CRM #[id number]. Topic: Clinical - Lab/Test Results >> Sep 02, 2024  2:45 PM Sherry Zimmerman wrote: Reason for CRM: Pt returning call about her Lung ct screening results.

## 2024-09-05 NOTE — Telephone Encounter (Signed)
 Attempted to contact pt. Left VM for pt to call bacl to discuss lung screening results.

## 2024-09-14 ENCOUNTER — Encounter: Payer: Self-pay | Admitting: Pulmonary Disease

## 2024-09-14 ENCOUNTER — Ambulatory Visit: Admitting: Pulmonary Disease

## 2024-09-14 VITALS — BP 118/78 | HR 80 | Temp 98.2°F | Ht 68.0 in | Wt 119.2 lb

## 2024-09-14 DIAGNOSIS — R911 Solitary pulmonary nodule: Secondary | ICD-10-CM | POA: Diagnosis not present

## 2024-09-14 DIAGNOSIS — J439 Emphysema, unspecified: Secondary | ICD-10-CM | POA: Diagnosis not present

## 2024-09-14 DIAGNOSIS — J9611 Chronic respiratory failure with hypoxia: Secondary | ICD-10-CM | POA: Diagnosis not present

## 2024-09-14 NOTE — Progress Notes (Unsigned)
 @Patient  ID: Sherry Zimmerman, female    DOB: [birth year], 68 y.o.   MRN: [id number]  No chief complaint on file.   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  Most recent cardiology note reviewed.  Multiple PCP notes reviewed.  Multiple telephone encounters from our office reviewed.  Started Dupixent  in interim since last visit.  She is unsure how much it helps.  Husband, present at visit, notes marked improvement.  Albeit still significant dyspnea she is able to get around albeit short distance is a bit better than before.  Good adherence to inhalers on report.  Recent CT lung cancer scan reviewed, small 4.7 mm nodule new, 54-month follow-up scan recommended and this is already been ordered.  HPI initial visit: Short of breath for about a year now.  Gradually worsening.  Worse on inclines or stairs.  No time of day when better or worse.  No position makes his better or worse.  No environmental or seasonal factors she can identify things better or worse.  Uses inhaler Bevespi  and albuterol .  Both infrequently.  Not using Bevespi  twice daily.  They seem to help a little bit.  No other alleviating or exacerbating factors.  Reviewed most recent chest image CT scan low-dose lung cancer screening 08/2023 that on my review interpretation shows severe emphysema with most severe in the upper lobes but significant emphysema in the middle lobe on the right and bilateral lower lobes, scattered small nodules lung RADS 3 recommended 63-month follow-up.  This was relayed to her and repeat CT scan has been ordered.  Discussed role and rationale for inhaler therapy and importance of adherence.  Discussed role and rationale for escalating inhalers in the future if needed.  Discussed role and rationale for additional testing to help understand current state of lungs and achieve the most accurate diagnosis  we can help her in the future.  Questionaires / Pulmonary Flowsheets:   ACT:      No data to display          MMRC:     No data to display          Epworth:      No data to display          Tests:   FENO:  No results found for: NITRICOXIDE  PFT:    Latest Ref Rng & Units 02/24/2024    1:21 PM 05/24/2018    2:49 PM  PFT Results  FVC-Pre L 2.36  3.50   FVC-Predicted Pre % 69  91   FVC-Post L 2.59  3.41   FVC-Predicted Post % 76  88   Pre FEV1/FVC % % 42  52   Post FEV1/FCV % % 45  53   FEV1-Pre L 0.98  1.82   FEV1-Predicted Pre % 37  61   FEV1-Post L 1.16  1.80   DLCO uncorrected ml/min/mmHg 9.85  14.24   DLCO UNC% % 45  45   DLCO corrected ml/min/mmHg 10.78    DLCO COR %Predicted % 49    DLVA Predicted % 59  59   TLC L 6.49  6.30   TLC % Predicted % 117  108   RV % Predicted % 150  136   Personally viewed and interpreted as severe nasal obstruction, lung volumes with air trapping, DLCO severely reduced.  Compared to 2019  obstruction now severe with stable lung volumes DLCO.  WALK:      No data to display          Imaging: Personally reviewed and as per EMR and discussion in this note CT CHEST LUNG CA SCREEN LOW DOSE W/O CM Result Date: 09/01/2024 CLINICAL DATA:  Former 50 pack-year smoker, quit 2022. EXAM: CT CHEST WITHOUT CONTRAST LOW-DOSE FOR LUNG CANCER SCREENING TECHNIQUE: Multidetector CT imaging of the chest was performed following the standard protocol without IV contrast. RADIATION DOSE REDUCTION: This exam was performed according to the departmental dose-optimization program which includes automated exposure control, adjustment of the mA and/or kV according to patient size and/or use of iterative reconstruction technique. COMPARISON:  12/13/2023 and 08/11/2023. FINDINGS: Cardiovascular: Atherosclerotic calcification of the aorta and coronary arteries. Heart size normal. No pericardial effusion. Mediastinum/Nodes: No pathologically enlarged  mediastinal or axillary lymph nodes. Hilar regions are difficult to definitively evaluate without IV contrast. Esophagus is grossly unremarkable. Lungs/Pleura: Centrilobular and paraseptal emphysema. Pleuroparenchymal scarring in the lower left hemithorax. New 4.7 mm posterior segment right upper lobe nodule (image 81). New clustered peribronchovascular nodularity in the medial right lower lobe (images [phone] 6-174), likely postinfectious in etiology. Additional pulmonary nodules measure 2.9 mm or less in size, as before. No pleural fluid. Airway is unremarkable. Upper Abdomen: Hepatic cyst. Small left renal stone. Splenectomy. Splenosis in the left upper quadrant. Visualized portions of the liver, gallbladder, adrenal glands, kidneys, pancreas, stomach and bowel are otherwise grossly unremarkable. No upper abdominal adenopathy. Musculoskeletal: Degenerative changes in the spine. IMPRESSION: 1. New 4.7 mm posterior segment right upper lobe nodule. Lung-RADS 3, probably benign findings. Short-term follow-up in 6 months is recommended with repeat low-dose chest CT without contrast (please use the following order, CT CHEST LCS NODULE FOLLOW-UP W/O CM). These results will be called to the ordering clinician or representative by the Radiologist Assistant, and communication documented in the PACS or [employer]. 2. Small left renal stone. 3. Aortic atherosclerosis (ICD10-I70.0). Coronary artery calcification. 4.  Emphysema (ICD10-J43.9). Electronically Signed   By: Newell Eke M.D.   On: 09/01/2024 13:42     Lab Results: Personally reviewed CBC    Component Value Date/Time   WBC 14.6 (H) 03/30/2024 1314   RBC 4.45 03/30/2024 1314   HGB 12.6 03/30/2024 1314   HGB 10.9 (L) 12/31/2023 1222   HCT 39.5 03/30/2024 1314   HCT 35.1 12/31/2023 1222   PLT 548.0 (H) 03/30/2024 1314   PLT 793 (H) 12/31/2023 1222   MCV 88.6 03/30/2024 1314   MCV 84 12/31/2023 1222   MCH 26.0 (L) 12/31/2023 1222   MCH 25.3  (L) 12/17/2023 0220   MCHC 32.0 03/30/2024 1314   RDW 16.5 (H) 03/30/2024 1314   RDW 16.8 (H) 12/31/2023 1222   LYMPHSABS 2.3 03/30/2024 1314   MONOABS 0.8 03/30/2024 1314   EOSABS 0.2 03/30/2024 1314   BASOSABS 0.1 03/30/2024 1314    BMET    Component Value Date/Time   NA 140 03/30/2024 1314   K 3.2 (L) 03/30/2024 1314   CL 101 03/30/2024 1314   CO2 29 03/30/2024 1314   GLUCOSE 150 (H) 03/30/2024 1314   BUN 13 03/30/2024 1314   CREATININE 0.93 03/30/2024 1314   CREATININE 0.80 06/18/2018 1353   CALCIUM  9.3 03/30/2024 1314   GFRNONAA >60 12/16/2023 0226   GFRNONAA >60 06/18/2018 1353   GFRAA >60 06/18/2018 1353    BNP    Component Value Date/Time   BNP 88.0 12/13/2023 0424  ProBNP No results found for: PROBNP  Specialty Problems       Pulmonary Problems   AR (allergic rhinitis)   Emphysema of lung (HCC)   Chest ct for lung ca screening 2021       Allergies  Allergen Reactions   Morphine     Unknown reaction   Sulfa Antibiotics Nausea And Vomiting and Other (See Comments)    Made me sick    Immunization History  Administered Date(s) Administered   DTaP / HiB 11/09/2014   Fluad Quad(high Dose 65+) 10/20/2022   Fluad Trivalent(High Dose 65+) 10/07/2023   HIB (PRP-T) 11/09/2014   Influenza Split 11/19/2015   Influenza, Seasonal, Injecte, Preservative Fre 10/20/2014   Influenza,inj,Quad PF,6+ Mos 10/10/2016, 09/11/2017, 08/13/2018, 08/29/2019   Influenza-Unspecified 09/26/2017, 10/09/2020   Janssen (J&J) SARS-COV-2 Vaccination 03/15/2020   Meningococcal Conjugate 11/09/2014, 01/05/2015   Pneumococcal Conjugate-13 11/09/2014   Pneumococcal Polysaccharide-23 05/02/2015, 05/23/2020   Tdap 12/08/2008   Zoster Recombinant(Shingrix ) 08/31/2019, 12/26/2019    Past Medical History:  Diagnosis Date   Anxiety    AR (allergic rhinitis)    Cerebellar ataxia (HCC) 2010   COPD (chronic obstructive pulmonary disease) (HCC)    Depression    Iron  deficiency anemia due to chronic blood loss 06/18/2018   Major depression, recurrent, chronic 08/13/2018   Managed with effexor , chronically   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    Tobacco History: Social History   Tobacco Use  Smoking Status Former   Average packs/day: 1 pack/day for 50.0 years (50.0 ttl pk-yrs)   Types: Cigarettes   Start date: 07/2021   Quit date: 12/08/1978   Years since quitting: 45.8  Smokeless Tobacco Never  Tobacco Comments   Quit Oct 2024 01/13/2023 hfb 01/14/2024   Counseling given: Not Answered Tobacco comments: Quit Oct 2024 01/13/2023 hfb 01/14/2024   Continue to not smoke  Outpatient Encounter Medications as of 09/14/2024  Medication Sig   albuterol  (PROVENTIL ) (2.5 MG/3ML) 0.083% nebulizer solution Take 3 mLs (2.5 mg total) by nebulization every 6 (six) hours as needed for wheezing or shortness of breath.   albuterol  (VENTOLIN  HFA) 108 (90 Base) MCG/ACT inhaler Inhale 1-2 puffs into the lungs every 4 (four) hours as needed for wheezing or shortness of breath.   alendronate  (FOSAMAX ) 70 MG tablet Take 1 tablet (70 mg total) by mouth every 7 (seven) days. Take with a full glass of water on an empty stomach.   aspirin  EC 81 MG tablet Take 1 tablet (81 mg total) by mouth daily. Swallow whole.   atorvastatin  (LIPITOR ) 80 MG tablet Take 1 tablet (80 mg total) by mouth daily.   Budeson-Glycopyrrol-Formoterol  (BREZTRI  AEROSPHERE) 160-9-4.8 MCG/ACT AERO Inhale 2 puffs into the lungs in the morning and at bedtime.   calcium -vitamin D  (OSCAL WITH D) 500-5 MG-MCG tablet Take 1 tablet by mouth daily with breakfast.   cyanocobalamin  (VITAMIN B12) 500 MCG tablet Take 500 mcg by mouth daily.    Dupilumab  (DUPIXENT ) 300 MG/2ML SOAJ Inject 300 mg into the skin every 14 (fourteen) days.   ferrous sulfate  325 (65 FE) MG EC tablet Take 1 tablet (325 mg total) by mouth daily with breakfast.   furosemide  (LASIX ) 20  MG tablet Take 1 tablet (20 mg total) by mouth daily.   metoprolol  succinate (TOPROL -XL) 25 MG 24 hr tablet Take 1 tablet (25 mg total) by mouth daily.   oxybutynin  (DITROPAN ) 5 MG tablet Take 1 tablet (5 mg total) by mouth 2 (two) times daily.   OXYGEN Inhale 4 L into the lungs continuous.   potassium chloride  SA (KLOR-CON  M) 20 MEQ tablet Take 1 tablet (20 mEq total) by mouth daily as needed. Take with furosemide    prasugrel  (EFFIENT ) 10 MG TABS tablet Take 1 tablet by mouth once daily   valACYclovir  (VALTREX ) 1000 MG tablet Take 2 tablets (2,000 mg total) by mouth 2 (two) times daily. Once, as needed for cold sores   venlafaxine  XR (EFFEXOR -XR) 150 MG 24 hr capsule TAKE 1 CAPSULE BY MOUTH AT BEDTIME **TAKE  WITH  75MG   CAPSULES  FOR  225MG   TOTAL  DAILY  DOSE   venlafaxine  XR (EFFEXOR -XR) 75 MG 24 hr capsule TAKE 1 CAPSULE BY MOUTH ONCE DAILY **TAKE  WITH  150MG   CAPSULES  FOR  225MG   TOTAL  DAILY  DOSE   No facility-administered encounter medications on file as of 09/14/2024.     Review of Systems  Review of Systems  N/a Physical Exam  BP 118/78   Pulse 80   Temp 98.2 F (36.8 C) (Oral)   Ht 5' 8 (1.727 m)   Wt 119 lb 3.2 oz (54.1 kg)   SpO2 95%   BMI 18.12 kg/m   Wt Readings from Last 5 Encounters:  09/14/24 119 lb 3.2 oz (54.1 kg)  07/27/24 122 lb (55.3 kg)  06/09/24 122 lb 3.2 oz (55.4 kg)  03/30/24 126 lb 12.8 oz (57.5 kg)  03/21/24 127 lb (57.6 kg)    BMI Readings from Last 5 Encounters:  09/14/24 18.12 kg/m  07/27/24 18.55 kg/m  06/09/24 18.58 kg/m  03/30/24 20.47 kg/m  03/21/24 20.50 kg/m     Physical Exam General: Sitting in chair, no acute distress Eyes: EOMI, no icterus Neck: Supple, no JVD appreciated Pulmonary: Distant, clear,  normal work of breathing Cardiovascular: Warm, no edema, regular rate and rhythm MSK: No synovitis, no joint effusion Neuro: Normal gait, no weakness Psych: Normal mood, full affect   Assessment & Plan:   Dyspnea on exertion: High suspicion for smoking-related lung disease given severe emphysema seen on CT scan.  Prior PFTs 2020 with very mild COPD.  Repeat PFTs 02/2024 with very severe COPD.  This is likely primary driver of symptoms.  Emphysema with severe COPD: FEV1 20%, very severe obstruction on PFTs 01/2024.  Escalated to Breztri  with exacerbation in late 2024.  Continue Breztri .  Continue Dupixent .  Interval improvement with biologic therapy.  Chronic hypoxemic respiratory failure: Related to emphysema, COPD.  Continue oxygen supplementation.  Goal O2 sats 88% or greater.  Lung nodule: 4.7 mm seen on lung cancer screening scan.  57-month interval scan already ordered for follow-up.   Return in about 5 months (around 02/12/2025) for f/u Dr. Annella, after CT scan.    Donnice JONELLE Annella, MD 09/15/2024

## 2024-09-14 NOTE — Patient Instructions (Signed)
 Nice to see you again  No changes to medication  I am glad the Dupixent  seems to help some  Return to clinic in 5 months after CT scan with Dr. Annella, once the CT scan is scheduled we can reschedule the office visit if we need to, I want to go over the CT scan with you guys after it is done

## 2024-10-03 ENCOUNTER — Encounter: Admitting: Family Medicine

## 2024-10-05 ENCOUNTER — Other Ambulatory Visit: Payer: Self-pay | Admitting: Family Medicine

## 2024-10-10 ENCOUNTER — Encounter: Admitting: Family Medicine

## 2024-10-13 ENCOUNTER — Ambulatory Visit (INDEPENDENT_AMBULATORY_CARE_PROVIDER_SITE_OTHER): Admitting: Family Medicine

## 2024-10-13 ENCOUNTER — Encounter: Payer: Self-pay | Admitting: Family Medicine

## 2024-10-13 VITALS — BP 138/82 | HR 74 | Temp 97.9°F | Ht 68.0 in | Wt 120.6 lb

## 2024-10-13 DIAGNOSIS — Z23 Encounter for immunization: Secondary | ICD-10-CM

## 2024-10-13 DIAGNOSIS — I2510 Atherosclerotic heart disease of native coronary artery without angina pectoris: Secondary | ICD-10-CM

## 2024-10-13 DIAGNOSIS — I252 Old myocardial infarction: Secondary | ICD-10-CM | POA: Diagnosis not present

## 2024-10-13 DIAGNOSIS — J439 Emphysema, unspecified: Secondary | ICD-10-CM | POA: Diagnosis not present

## 2024-10-13 DIAGNOSIS — I2584 Coronary atherosclerosis due to calcified coronary lesion: Secondary | ICD-10-CM | POA: Diagnosis not present

## 2024-10-13 DIAGNOSIS — Z780 Asymptomatic menopausal state: Secondary | ICD-10-CM

## 2024-10-13 DIAGNOSIS — Z0000 Encounter for general adult medical examination without abnormal findings: Secondary | ICD-10-CM

## 2024-10-13 DIAGNOSIS — E782 Mixed hyperlipidemia: Secondary | ICD-10-CM | POA: Diagnosis not present

## 2024-10-13 DIAGNOSIS — F339 Major depressive disorder, recurrent, unspecified: Secondary | ICD-10-CM

## 2024-10-13 DIAGNOSIS — R7301 Impaired fasting glucose: Secondary | ICD-10-CM

## 2024-10-13 DIAGNOSIS — N3281 Overactive bladder: Secondary | ICD-10-CM

## 2024-10-13 DIAGNOSIS — Z1231 Encounter for screening mammogram for malignant neoplasm of breast: Secondary | ICD-10-CM

## 2024-10-13 DIAGNOSIS — M810 Age-related osteoporosis without current pathological fracture: Secondary | ICD-10-CM

## 2024-10-13 DIAGNOSIS — Z0001 Encounter for general adult medical examination with abnormal findings: Secondary | ICD-10-CM

## 2024-10-13 DIAGNOSIS — G119 Hereditary ataxia, unspecified: Secondary | ICD-10-CM

## 2024-10-13 LAB — VITAMIN B12: Vitamin B-12: >1500 pg/mL — ABNORMAL HIGH (ref 211–911)

## 2024-10-13 LAB — CBC WITH DIFFERENTIAL/PLATELET
Basophils Absolute: 0.1 K/uL (ref 0.0–0.1)
Basophils Relative: 0.9 % (ref 0.0–3.0)
Eosinophils Absolute: 1 K/uL — ABNORMAL HIGH (ref 0.0–0.7)
Eosinophils Relative: 6.4 % — ABNORMAL HIGH (ref 0.0–5.0)
HCT: 40.7 % (ref 36.0–46.0)
Hemoglobin: 13 g/dL (ref 12.0–15.0)
Lymphocytes Relative: 20.5 % (ref 12.0–46.0)
Lymphs Abs: 3.1 K/uL (ref 0.7–4.0)
MCHC: 31.9 g/dL (ref 30.0–36.0)
MCV: 91.1 fl (ref 78.0–100.0)
Monocytes Absolute: 1.4 K/uL — ABNORMAL HIGH (ref 0.1–1.0)
Monocytes Relative: 9.4 % (ref 3.0–12.0)
Neutro Abs: 9.4 K/uL — ABNORMAL HIGH (ref 1.4–7.7)
Neutrophils Relative %: 62.8 % (ref 43.0–77.0)
Platelets: 499 K/uL — ABNORMAL HIGH (ref 150.0–400.0)
RBC: 4.47 Mil/uL (ref 3.87–5.11)
RDW: 15.3 % (ref 11.5–15.5)
WBC: 15.1 K/uL — ABNORMAL HIGH (ref 4.0–10.5)

## 2024-10-13 LAB — LIPID PANEL
Cholesterol: 124 mg/dL (ref 0–200)
HDL: 57 mg/dL (ref >39.00)
LDL Cholesterol: 47 mg/dL (ref 0–99)
NonHDL: 66.94
Total CHOL/HDL Ratio: 2
Triglycerides: 102 mg/dL (ref 0.0–149.0)
VLDL: 20.4 mg/dL (ref 0.0–40.0)

## 2024-10-13 LAB — TSH: TSH: 1.77 u[IU]/mL (ref 0.35–5.50)

## 2024-10-13 LAB — COMPREHENSIVE METABOLIC PANEL WITH GFR
ALT: 17 U/L (ref 0–35)
AST: 20 U/L (ref 0–37)
Albumin: 4 g/dL (ref 3.5–5.2)
Alkaline Phosphatase: 99 U/L (ref 39–117)
BUN: 17 mg/dL (ref 6–23)
CO2: 33 meq/L — ABNORMAL HIGH (ref 19–32)
Calcium: 9.7 mg/dL (ref 8.4–10.5)
Chloride: 100 meq/L (ref 96–112)
Creatinine, Ser: 0.85 mg/dL (ref 0.40–1.20)
GFR: 69.73 mL/min (ref >60.00)
Glucose, Bld: 98 mg/dL (ref 70–99)
Potassium: 4.1 meq/L (ref 3.5–5.1)
Sodium: 145 meq/L (ref 135–145)
Total Bilirubin: 0.4 mg/dL (ref 0.2–1.2)
Total Protein: 6.8 g/dL (ref 6.0–8.3)

## 2024-10-13 LAB — HEMOGLOBIN A1C: Hgb A1c MFr Bld: 6.6 % — ABNORMAL HIGH (ref 4.6–6.5)

## 2024-10-13 LAB — VITAMIN D 25 HYDROXY (VIT D DEFICIENCY, FRACTURES): VITD: 32.57 ng/mL (ref 30.00–100.00)

## 2024-10-13 MED ORDER — VENLAFAXINE HCL ER 75 MG PO CP24: ORAL_CAPSULE | ORAL | 0 refills | Status: AC

## 2024-10-13 MED ORDER — VENLAFAXINE HCL ER 150 MG PO CP24
ORAL_CAPSULE | ORAL | 3 refills | Status: AC
Start: 2024-10-13 — End: ?

## 2024-10-13 MED ORDER — OXYBUTYNIN CHLORIDE 5 MG PO TABS: 5.0000 mg | ORAL_TABLET | Freq: Two times a day (BID) | ORAL | 3 refills | Status: AC

## 2024-10-13 NOTE — Progress Notes (Signed)
 Subjective  Chief Complaint  Patient presents with   Annual Exam    Pt here for Annual Exam an dis not currently fastin. Mammo, DEXA, and colon has not been scheduled     HPI: Sherry Zimmerman is a 68 y.o. female who presents to [facility] [place] today for a Female Wellness Visit. She also has the concerns and/or needs as listed above in the chief complaint. These will be addressed in addition to the Health Maintenance Visit.   Wellness Visit: annual visit with health maintenance review and exam  HM: Patient no longer candidate for colonoscopy due to severe COPD.  Has history of polyps a Cologuard would not be a good screening either.  Discussed other options.  Elect no further screening at this time.  She is due for mammogram and follow-up bone density.  Fortunately she is doing a lot better.  More energetic.  Eating well.  No chest pain and breathing is stable.  Flu shot eligible today  Chronic disease f/u and/or acute problem visit: (deemed necessary to be done in addition to the wellness visit): Discussed the use of AI scribe software for clinical note transcription with the patient, who gave verbal consent to proceed.  History of Present Illness Sherry Zimmerman is a 68 year old female who presents for routine follow-up and screening tests.  Severe COPD: Reviewed recent pulmonology notes.  Recent PFT shows severe disease.  On inhalers.  She reports her breathing is stable.  Rare and intermittent oxygen needs at this time.  No cough productive cough or fevers  CAD: Status post stenting.  Remains stable.  Recent cardiology follow-up reviewed.  No chest pain shortness of breath palpitations.  No complications or adverse effects from medications.  History of elevated A1c but this was during her acute illness, MI/COPD exacerbation.  Due for recheck.  She does admit to dry mouth.  No other symptoms of hyperglycemia.  She does enjoy sweets  Osteoporosis on Fosamax   since 2022 - Currently taking Fosamax  and vitamin D  - Also takes calcium  supplements - Due for recheck.  No fractures.  High fall risk.  Overactive bladder is improved on Ditropan  twice daily.  No symptoms of infection.  Depression is well-controlled on high-dose Effexor .    Assessment  1. Encounter for well adult exam with abnormal findings   2. Need for influenza vaccination   3. Emphysema of lung (HCC)   4. Coronary artery disease due to calcified coronary lesion   5. History of non-ST elevation myocardial infarction (NSTEMI)   6. Major depression, recurrent, chronic   7. Mixed hyperlipidemia   8. Osteoporosis without current pathological fracture, unspecified osteoporosis type   9. OAB (overactive bladder)   10. Familial cerebellar ataxia (HCC)   11. IFG (impaired fasting glucose)   12. Asymptomatic menopausal state   13. Screening mammogram for breast cancer      Plan  Female Wellness Visit: Age appropriate Health Maintenance and Prevention measures were discussed with patient. Included topics are cancer screening recommendations, ways to keep healthy (see AVS) including dietary and exercise recommendations, regular eye and dental care, use of seat belts, and avoidance of moderate alcohol use and tobacco use.  Ordered mammogram and bone density. BMI: discussed patient's BMI and encouraged positive lifestyle modifications to help get to or maintain a target BMI. HM needs and immunizations were addressed and ordered. See below for orders. See HM and immunization section for updates.  Flu shot updated today.  High-dose Routine labs and screening tests ordered including cmp, cbc and lipids where appropriate. Discussed recommendations regarding Vit D and calcium  supplementation (see AVS)  Chronic disease management visit and/or acute problem visit: Assessment and Plan Assessment & Plan Adult Wellness Visit Routine adult wellness visit with discussion on weight management and  muscle pain. - Ordered mammogram and bone density scan - Ordered blood work including glucose test - Provided phone number for scheduling appointments  Osteoporosis Managed with Fosamax . Bone density scan due for re-evaluation. - Continue Fosamax  - Ordered bone density scan Continue vitamin D  and calcium .-May  Impaired fasting glucose check A1c Concern of increased or diabetes, impaired fasting glucose due to elevated blood sugar levels during recent hospitalization. - Ordered blood work including glucose test - Advised on potential dietary modifications if diabetes is confirmed  Hyperlipidemia on statin due for fasting recheck.  Tolerates well.  Check LFTs today.  Goal LDL less than 70 CAD: Stable.  Continue Effexor  for depression Ditropan  for overactive bladder.  Continue follow-up with pulmonology for severe COPD.  Continue inhalers.  Continue intermittent oxygen.  Ataxia: High fall risk.  Uses wheelchair intermittently and walker. High risk for fracture.  General Health Maintenance Discussion on colon cancer screening and decision to discontinue due to mobility issues. Mammogram and bone density scan planned. Vitamin D  and calcium  supplementation ongoing. - Discontinued colon cancer screening - Continue vitamin D  and calcium  supplementation - Ensure flu shot is up to date    Follow up: 6 months for recheck Orders Placed This Encounter  Procedures   DG Bone Density   MM DIGITAL SCREENING BILATERAL   Flu vaccine HIGH DOSE PF(Fluzone Trivalent)   TSH   VITAMIN D  25 Hydroxy (Vit-D Deficiency, Fractures)   CBC with Differential/Platelet   Comprehensive metabolic panel with GFR   Lipid panel   Vitamin B12   Hemoglobin A1c   Meds ordered this encounter  Medications   oxybutynin  (DITROPAN ) 5 MG tablet    Sig: Take 1 tablet (5 mg total) by mouth 2 (two) times daily.    Dispense:  180 tablet    Refill:  3   venlafaxine  XR (EFFEXOR -XR) 75 MG 24 hr capsule    Sig: TAKE  1 CAPSULE BY MOUTH ONCE DAILY *TAKE  WITH  150  MG  CAPSULE  FOR  225  MG  DOSE*    Dispense:  90 capsule    Refill:  0   venlafaxine  XR (EFFEXOR -XR) 150 MG 24 hr capsule    Sig: TAKE 1 CAPSULE BY MOUTH AT BEDTIME **TAKE  WITH  75MG   CAPSULES  FOR  225MG   TOTAL  DAILY  DOSE    Dispense:  90 capsule    Refill:  3      Body mass index is 18.34 kg/m. Wt Readings from Last 3 Encounters:  10/13/24 120 lb 9.6 oz (54.7 kg)  09/14/24 119 lb 3.2 oz (54.1 kg)  07/27/24 122 lb (55.3 kg)     Patient Active Problem List   Diagnosis Date Noted   Status post coronary artery stent placement 12/16/2023    Priority: High   History of non-ST elevation myocardial infarction (NSTEMI) 12/13/2023    Priority: High   Coronary artery disease due to calcified coronary lesion 12/13/2023    Priority: High   Atherosclerosis of aorta 12/13/2023    Priority: High   Osteoporosis 07/08/2021    Priority: High    DEXA 03/2021: lowest T = -3.3, L femur; forearm -2.2; started tx  07/2021    Former smoker 07/08/2021    Priority: High    Quit 03/2021; long term smoker    Mixed hyperlipidemia 11/18/2019    Priority: High    LDL 115, ASCVD 8.4 2018: started lipitor . Lipitor  increased to 80mg  12/2023 due to CAD    Major depression, recurrent, chronic 08/13/2018    Priority: High    Managed with effexor , chronically    Emphysema of lung (HCC) 05/13/2018    Priority: High    Chest ct for lung ca screening 2021 09/2024: Emphysema with severe COPD: FEV1 20%, very severe obstruction on PFTs 01/2024. Escalated to Breztri  with exacerbation in late 2024. Continue Breztri . Continue Dupixent . Interval improvement with biologic therapy.     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    Priority: High    Overview:  Followed by neurology, Dr. 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   Prolonged QT interval 12/13/2023   Health Maintenance  Topic Date Due   DEXA SCAN  04/03/2023   Influenza Vaccine  07/08/2024   Mammogram  09/02/2024   COVID-19 Vaccine (2 - Janssen risk series) 10/29/2024 (Originally 04/12/2020)   DTaP/Tdap/Td (3 - Td or Tdap) 11/09/2024   Medicare Annual Wellness (AWV)  07/27/2025   Lung Cancer Screening  08/22/2025   Pneumococcal Vaccine: 50+ Years  Completed   Hepatitis C Screening  Completed   Zoster Vaccines- Shingrix   Completed   Meningococcal B Vaccine  Aged Out   Colonoscopy  Discontinued   Immunization History  Administered Date(s) Administered   DTaP / HiB 11/09/2014   Fluad Quad(high Dose 65+) 10/20/2022   Fluad Trivalent(High Dose 65+) 10/07/2023   HIB (PRP-T) 11/09/2014   Influenza Split 11/19/2015   Influenza, Seasonal, Injecte, Preservative Fre 10/20/2014   Influenza,inj,Quad PF,6+ Mos 10/10/2016, 09/11/2017, 08/13/2018, 08/29/2019   Influenza-Unspecified 09/26/2017, 10/09/2020   Janssen (J&J) SARS-COV-2 Vaccination 03/15/2020   Meningococcal Conjugate 11/09/2014, 01/05/2015   Pneumococcal Conjugate-13 11/09/2014   Pneumococcal Polysaccharide-23 05/02/2015, 05/23/2020   Tdap 12/08/2008   Zoster Recombinant(Shingrix ) 08/31/2019, 12/26/2019   We updated and reviewed the patient's past history in detail and it is documented below. Allergies: Patient is allergic to morphine and sulfa antibiotics. 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 Past Surgical History Patient  has a past surgical history that includes Abdominal hysterectomy; Wrist fracture surgery (Right); Splenectomy, total (N/A, 10/26/2014); Cataract extraction (Bilateral, 01/17/2020); LEFT HEART CATH AND CORONARY ANGIOGRAPHY (N/A, 12/15/2023); and CORONARY  STENT INTERVENTION (N/A, 12/15/2023). Family History: Patient family history includes Ataxia in her mother; Cancer in her father; Diabetes in her mother; Heart disease in her mother; Stroke in her mother. Social History:  Patient  reports that she quit smoking about 45 years ago. Her smoking use included cigarettes. She started smoking about 3 years ago. She has a 50 pack-year smoking history. She has never used smokeless tobacco. She reports that she does not currently use alcohol. She reports that she does not use drugs.  Review of Systems: Constitutional: negative for fever or malaise Ophthalmic: negative for photophobia, double vision or loss of vision Cardiovascular: negative for chest pain, dyspnea on exertion, or new LE swelling Respiratory: negative for SOB or persistent cough Gastrointestinal: negative for abdominal pain, change in bowel habits or melena Genitourinary: negative for dysuria or gross hematuria, no abnormal uterine bleeding or disharge Musculoskeletal: negative for new gait disturbance or muscular weakness Integumentary: negative for new or persistent rashes, no breast lumps Neurological: negative for TIA or stroke  symptoms Psychiatric: negative for SI or delusions Allergic/Immunologic: negative for hives  Patient Care Team    Relationship Specialty Notifications Start End  Jodie Lavern CROME, MD PCP - General Family Medicine  12/13/23   Michele Richardson, DO PCP - Cardiology Cardiology  12/31/23   Jenel Arvin POUR., MD Referring Physician Psychiatry  12/29/18   Anner Alm ORN, MD Consulting Physician Cardiology  12/16/23   Hunsucker, Donnice SAUNDERS, MD Consulting Physician Pulmonary Disease  12/16/23     Objective  Vitals: BP 138/82   Pulse 74   Temp 97.9 F (36.6 C) (Temporal)   Ht 5' 8 (1.727 m)   Wt 120 lb 9.6 oz (54.7 kg)   SpO2 93%   BMI 18.34 kg/m  General:  Well developed, well nourished, no acute distress thin but appears well, Psych:  Alert and orientedx3,normal mood and affect, bright affect today HEENT:  Normocephalic, atraumatic, non-icteric sclera,  supple neck without adenopathy, mass or thyromegaly Cardiovascular:  Normal S1, S2, RRR without gallop, rub or murmur Respiratory: Distant breath sounds, normal work of breathing  gastrointestinal: normal bowel sounds, soft, non-tender, no noted masses. No HSM MSK: extremities without edema, joints without erythema or swelling, normal pulses Neurologic:    Mental status is normal.    Commons side effects, risks, benefits, and alternatives for medications and treatment plan prescribed today were discussed, and the patient expressed understanding of the given instructions. Patient is instructed to call or message via MyChart if he/she has any questions or concerns regarding our treatment plan. No barriers to understanding were identified. We discussed Red Flag symptoms and signs in detail. Patient expressed understanding regarding what to do in case of urgent or emergency type symptoms.  Medication list was reconciled, printed and provided to the patient in AVS. Patient instructions and summary information was reviewed with the patient as documented in the  AVS. This note was prepared with assistance of Dragon voice recognition software. Occasional wrong-word or sound-a-like substitutions may have occurred due to the inherent limitations of voice recognition software

## 2024-10-13 NOTE — Patient Instructions (Signed)
 Please return in 6 months for recheck  I will release your lab results to you on your MyChart account with further instructions. You may see the results before I do, but when I review them I will send you a message with my report or have my assistant call you if things need to be discussed. Please reply to my message with any questions. Thank you!   If you have any questions or concerns, please don't hesitate to send me a message via MyChart or call the office at [phone]. Thank you for visiting with us  today! It's our pleasure caring for you.   Please call the office checked below to schedule your appointment for your mammogram and/or bone density screen (the checked studies were ordered): [x]   Mammogram  [x]   Bone Density  [facility] in [place]:  [phone]

## 2024-10-14 ENCOUNTER — Telehealth: Payer: Self-pay

## 2024-10-14 ENCOUNTER — Other Ambulatory Visit: Payer: Self-pay

## 2024-10-14 DIAGNOSIS — Z1231 Encounter for screening mammogram for malignant neoplasm of breast: Secondary | ICD-10-CM

## 2024-10-14 DIAGNOSIS — M810 Age-related osteoporosis without current pathological fracture: Secondary | ICD-10-CM

## 2024-10-14 NOTE — Telephone Encounter (Signed)
 Copied from CRM #[id number]. Topic: Referral - Question >> Oct 13, 2024  2:41 PM Alfonso HERO wrote: Reason for CRM: patient called to request orders for mammogram and bone density test be faxed over to [facility] [phone]. If you need to call them their number is [phone].  Orders has been placed

## 2024-10-18 ENCOUNTER — Ambulatory Visit: Payer: Self-pay | Admitting: Family Medicine

## 2024-10-18 NOTE — Progress Notes (Signed)
 See mychart note Dear Sherry Zimmerman, Your lab results show that you have just tipped over into the diagnosis if diabetes. Please start monitoring your diet: want to decrease sugars, sweetened beverages and processed carbohydrates. No medications are needed at this time. But please return in 6 months for recheck. Your other lab results are stable. I am glad you are doing better overall. Sincerely, Dr. Jodie

## 2024-10-31 ENCOUNTER — Other Ambulatory Visit: Payer: Self-pay

## 2024-10-31 ENCOUNTER — Telehealth: Payer: Self-pay

## 2024-10-31 DIAGNOSIS — Z1231 Encounter for screening mammogram for malignant neoplasm of breast: Secondary | ICD-10-CM

## 2024-10-31 DIAGNOSIS — M810 Age-related osteoporosis without current pathological fracture: Secondary | ICD-10-CM

## 2024-10-31 NOTE — Telephone Encounter (Signed)
 Copied from CRM #[id number]. Topic: Referral - Question >> Oct 13, 2024  2:41 PM Sherry Zimmerman wrote: Reason for CRM: patient called to request orders for mammogram and bone density test be faxed over to [facility] [phone]. If you need to call them their number is [phone]. >> Oct 31, 2024  9:55 AM Sherry Zimmerman wrote: Pt is following up on this request. Please advise #[phone]

## 2024-11-23 ENCOUNTER — Other Ambulatory Visit: Payer: Self-pay | Admitting: Family Medicine

## 2024-11-23 NOTE — Telephone Encounter (Signed)
 Last OV: 10/13/2024  Next OV: 04/12/2025  Last Refill: 07/02/2023  Dispense: 9g/2

## 2024-11-24 ENCOUNTER — Telehealth: Payer: Self-pay | Admitting: Family Medicine

## 2024-11-24 LAB — HM DEXA SCAN

## 2024-11-24 LAB — HM MAMMOGRAPHY

## 2024-11-24 NOTE — Telephone Encounter (Signed)
 Type of form received: [employer] form for select quote  Additional comments: mail to pt's home once completed and faxed over  Received by: Therisa ORN  Form should be Faxed to:[phone]  Form should be mailed to:    Is patient requesting call for pickup: no, just a copy sent via mail   Form placed:  pcp inbox  Attach charge sheet.   Individual made aware of 3-5 business day turn around (Y/N)? yes

## 2024-11-24 NOTE — Telephone Encounter (Signed)
Placed in provider box for review and signature.

## 2024-11-28 ENCOUNTER — Encounter: Payer: Self-pay | Admitting: Family Medicine

## 2024-11-30 ENCOUNTER — Encounter: Payer: Self-pay | Admitting: Family Medicine

## 2024-12-13 ENCOUNTER — Telehealth: Payer: Self-pay

## 2024-12-13 NOTE — Telephone Encounter (Signed)
 MyChart message sent. Trying to complete paperwork request.

## 2025-01-04 ENCOUNTER — Other Ambulatory Visit: Payer: Self-pay

## 2025-01-04 ENCOUNTER — Ambulatory Visit: Payer: Self-pay | Admitting: Family Medicine

## 2025-01-04 ENCOUNTER — Encounter: Payer: Self-pay | Admitting: Pulmonary Disease

## 2025-01-04 ENCOUNTER — Other Ambulatory Visit: Payer: Self-pay | Admitting: Family Medicine

## 2025-01-04 DIAGNOSIS — M810 Age-related osteoporosis without current pathological fracture: Secondary | ICD-10-CM

## 2025-01-04 MED ORDER — ROMOSOZUMAB-AQQG 105 MG/1.17ML ~~LOC~~ SOSY: 210.0000 mg | PREFILLED_SYRINGE | Freq: Once | SUBCUTANEOUS | Status: AC

## 2025-01-04 NOTE — Progress Notes (Signed)
 Please call patient:bone density shows mild worsening of osteoporosis in spite of fosamax  treatment.  I recommend considering changing to evenity  for one year: these are monthly injections. Please ask if she would be able to do this if we could get her approved and then refer to osteoporosis clinic. thanks

## 2025-01-09 ENCOUNTER — Encounter: Payer: Self-pay | Admitting: Hematology & Oncology

## 2025-01-09 ENCOUNTER — Other Ambulatory Visit (HOSPITAL_COMMUNITY): Payer: Self-pay

## 2025-01-09 ENCOUNTER — Telehealth: Payer: Self-pay

## 2025-01-09 NOTE — Telephone Encounter (Signed)
 Evenity  VOB initiated via [email or web address]  Last OV:  Next OV:  Last Evenity  inj:  Next Evenity  inj DUE: NEW START

## 2025-01-10 ENCOUNTER — Other Ambulatory Visit (HOSPITAL_COMMUNITY): Payer: Self-pay

## 2025-01-10 NOTE — Telephone Encounter (Signed)
 SABRA

## 2025-01-10 NOTE — Telephone Encounter (Signed)
 MEDICAL PA SUBMITTED VIA LATENT. KEY: [id number]   PHARMACY BENEFIT: $ 1505.59 (TRANSITION FILL, PA WILL BE REQUIRED FOR NEXT FILL)

## 2025-01-11 ENCOUNTER — Other Ambulatory Visit: Payer: Self-pay | Admitting: *Deleted

## 2025-01-11 ENCOUNTER — Encounter: Payer: Self-pay | Admitting: Hematology & Oncology

## 2025-01-11 ENCOUNTER — Other Ambulatory Visit (HOSPITAL_COMMUNITY): Payer: Self-pay

## 2025-01-11 DIAGNOSIS — M810 Age-related osteoporosis without current pathological fracture: Secondary | ICD-10-CM

## 2025-01-11 MED ORDER — DENOSUMAB 60 MG/ML ~~LOC~~ SOSY: 60.0000 mg | PREFILLED_SYRINGE | SUBCUTANEOUS | Status: AC

## 2025-01-11 NOTE — Telephone Encounter (Signed)
 Pt ready for scheduling for EVENITY  on or after : 01/11/25  Option# 1 Buy/Bill (Office supplied medication)  Out-of-pocket cost due at time of  office visit: $527  Number of injection/visits approved: 12  Primary: [employer] Evenity  co-insurance: 20% Admin fee co-insurance: $45  Secondary: --- Evenity  co-insurance:  Admin fee co-insurance:   Medical Benefit Details: Date Benefits were checked: 01/10/25 Deductible: NO/ Coinsurance: 20%/ Admin Fee: $45  Prior Auth: APPROVED PA# [id number] Expiration Date: 01/10/25-12/07/25  # of doses approved: 12 ------------------------------------------------------------------------- Option# 2- Med Obtained from pharmacy  Pharmacy benefit: Copay $1505.59 (Paid to pharmacy) Admin Fee: $45 (Pay at clinic)  Prior Auth: TRANSITION FILL, PA WILL BE REQUIRED FOR NEXT FILL  PA# Expiration Date:   # of doses approved:  If patient wants fill through the pharmacy benefit please send prescription to: [facility], and include estimated need by date in rx notes. Pharmacy will ship medication directly to the office.  Patient NOT eligible for Evenity  Copay Card. Copay Card can make patient's cost as little as $25. Link to apply: [email or web address]   This summary of benefits is an estimation of the patient's out-of-pocket cost. Exact cost may very based on individual plan coverage.

## 2025-01-12 ENCOUNTER — Telehealth: Payer: Self-pay

## 2025-01-12 NOTE — Telephone Encounter (Signed)
 Prolia  VOB initiated via [email or web address]  Next Prolia  inj DUE: NEW START

## 2025-01-13 ENCOUNTER — Other Ambulatory Visit (HOSPITAL_COMMUNITY): Payer: Self-pay

## 2025-01-13 NOTE — Telephone Encounter (Signed)
 Pt ready for scheduling for PROLIA  on or after : 01/13/25  Option# 1: Buy/Bill (Office supplied medication)  Out-of-pocket cost due at time of clinic visit: $397  Number of injection/visits approved: 2  Primary: [employer] Prolia  co-insurance: 20% Admin fee co-insurance: $45  Secondary: --- Prolia  co-insurance:  Admin fee co-insurance:   Medical Benefit Details: Date Benefits were checked: 01/13/25 Deductible: NO/ Coinsurance: 20%/ Admin Fee: $45  Prior Auth: APPROVED PA# [id number] Expiration Date: 01/13/25-12/07/25    # of doses approved: 2 ----------------------------------------------------------------------- Option# 2- Med Obtained from pharmacy:  Pharmacy benefit: Copay [phone] (Paid to pharmacy) Admin Fee: $45 (Pay at clinic)  Prior Auth: N/A PA# Expiration Date:   # of doses approved:   If patient wants fill through the pharmacy benefit please send prescription to: [facility], and include estimated need by date in rx notes. Pharmacy will ship medication directly to the office.  Patient NOT eligible for Prolia  Copay Card. Copay Card can make patient's cost as little as $25. Link to apply: [email or web address]  ** This summary of benefits is an estimation of the patient's out-of-pocket cost. Exact cost may very based on individual plan coverage.

## 2025-01-13 NOTE — Telephone Encounter (Signed)
 Sherry Zimmerman

## 2025-01-13 NOTE — Telephone Encounter (Addendum)
 MEDICAL PA SUBMITTED VIA LATENT. KEY: [id number]

## 2025-02-02 ENCOUNTER — Encounter: Admitting: Physician Assistant

## 2025-02-20 ENCOUNTER — Encounter (HOSPITAL_BASED_OUTPATIENT_CLINIC_OR_DEPARTMENT_OTHER)

## 2025-04-03 ENCOUNTER — Ambulatory Visit: Admitting: Pulmonary Disease

## 2025-04-12 ENCOUNTER — Ambulatory Visit: Admitting: Family Medicine

## 2025-08-01 ENCOUNTER — Ambulatory Visit

## 2025-10-18 ENCOUNTER — Encounter: Admitting: Family Medicine
